# Patient Record
Sex: Male | Born: 1995 | Race: Black or African American | Hispanic: No | Marital: Single | Smoking: Former smoker
Health system: Southern US, Community
[De-identification: ages and names within clinical notes are randomized; demographics above are authoritative.]

## PROBLEM LIST (undated history)

## (undated) DIAGNOSIS — F329 Major depressive disorder, single episode, unspecified: Secondary | ICD-10-CM

## (undated) DIAGNOSIS — G8929 Other chronic pain: Secondary | ICD-10-CM

## (undated) DIAGNOSIS — F32A Depression, unspecified: Secondary | ICD-10-CM

## (undated) DIAGNOSIS — M545 Low back pain, unspecified: Secondary | ICD-10-CM

## (undated) DIAGNOSIS — D571 Sickle-cell disease without crisis: Secondary | ICD-10-CM

## (undated) DIAGNOSIS — D5700 Hb-SS disease with crisis, unspecified: Secondary | ICD-10-CM

## (undated) HISTORY — PX: SPLENECTOMY: SUR1306

---
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
## 2013-08-13 ENCOUNTER — Encounter (HOSPITAL_BASED_OUTPATIENT_CLINIC_OR_DEPARTMENT_OTHER): Payer: Self-pay | Admitting: Emergency Medicine

## 2013-08-13 ENCOUNTER — Emergency Department (HOSPITAL_BASED_OUTPATIENT_CLINIC_OR_DEPARTMENT_OTHER)
Admission: EM | Admit: 2013-08-13 | Discharge: 2013-08-13 | Disposition: A | Discharge status: Home / Self Care | Payer: Medicaid Other | Attending: Emergency Medicine | Admitting: Emergency Medicine

## 2013-08-13 DIAGNOSIS — F17200 Nicotine dependence, unspecified, uncomplicated: Secondary | ICD-10-CM | POA: Insufficient documentation

## 2013-08-13 DIAGNOSIS — Z79899 Other long term (current) drug therapy: Secondary | ICD-10-CM | POA: Insufficient documentation

## 2013-08-13 DIAGNOSIS — D5700 Hb-SS disease with crisis, unspecified: Secondary | ICD-10-CM | POA: Insufficient documentation

## 2013-08-13 HISTORY — DX: Hb-SS disease with crisis, unspecified: D57.00

## 2013-08-13 LAB — COMPREHENSIVE METABOLIC PANEL
ALT: 29 U/L (ref 0–53)
Alkaline Phosphatase: 74 U/L (ref 39–117)
CO2: 27 mEq/L (ref 19–32)
GFR calc Af Amer: >90 mL/min (ref >90)
GFR calc non Af Amer: >90 mL/min (ref >90)
Glucose, Bld: 107 mg/dL — ABNORMAL HIGH (ref 70–99)
Potassium: 4.2 mEq/L (ref 3.5–5.1)
Sodium: 136 mEq/L (ref 135–145)
Total Bilirubin: 2.3 mg/dL — ABNORMAL HIGH (ref 0.3–1.2)

## 2013-08-13 LAB — CBC
Hemoglobin: 9.2 g/dL — ABNORMAL LOW (ref 13.0–17.0)
RBC: 2.79 MIL/uL — ABNORMAL LOW (ref 4.22–5.81)

## 2013-08-13 MED ORDER — SODIUM CHLORIDE 0.9 % IV BOLUS (SEPSIS)
500.0000 mL | Freq: Once | INTRAVENOUS | Status: AC
  Administered 2013-08-13: 500 mL via INTRAVENOUS

## 2013-08-13 MED ORDER — OXYCODONE-ACETAMINOPHEN 5-325 MG PO TABS: 1.0000 | ORAL_TABLET | Freq: Four times a day (QID) | ORAL | Status: DC | PRN

## 2013-08-13 MED ORDER — KETOROLAC TROMETHAMINE 30 MG/ML IJ SOLN
30.0000 mg | Freq: Once | INTRAMUSCULAR | Status: AC
  Administered 2013-08-13: 30 mg via INTRAVENOUS
  Filled 2013-08-13: qty 1

## 2013-08-13 MED ORDER — ONDANSETRON HCL 4 MG/2ML IJ SOLN
4.0000 mg | Freq: Once | INTRAMUSCULAR | Status: AC
  Administered 2013-08-13: 4 mg via INTRAVENOUS
  Filled 2013-08-13: qty 2

## 2013-08-13 MED ORDER — HYDROMORPHONE HCL PF 1 MG/ML IJ SOLN
1.0000 mg | Freq: Once | INTRAMUSCULAR | Status: AC
  Administered 2013-08-13: 1 mg via INTRAVENOUS
  Filled 2013-08-13: qty 1

## 2013-08-13 NOTE — ED Provider Notes (Signed)
CSN: [id number]     Arrival date & time 08/13/13  1424 History  This chart was scribed for Ethelda Chick, MD by Dorothey Baseman, ED Scribe. This patient was seen in room MH04/MH04 and the patient's care was started at 3:54 PM.    Chief Complaint  Patient presents with  . Arm Pain   Patient is a 17 y.o. male presenting with arm pain. The history is provided by the patient. No language interpreter was used.  Arm Pain This is a recurrent problem. The current episode started more than 2 days ago. The problem occurs constantly. The problem has not changed since onset.Nothing relieves the symptoms. Treatments tried: hydrocodone. The treatment provided no relief.   HPI Comments: Jeffrey Morrison is a 17 y.o. Male with a history of sickle cell crisis who presents to the Emergency Department complaining of a constant pain to the right arm, from the shoulder to the elbow, onset 4 days ago that has not changed. Patient reports that his current symptoms feel similar to his past sickle cell crisis. Patient reports that [facility] usually manages his pain with hydrocodone, which has not been effective at relieving his current symptoms. He reports some associated chest pain that has since resolved. He denies fever. He states that he does not know his usual hemoglobin levels. Patient reports a history of splenectomy due to his sickle cell.   Past Medical History  Diagnosis Date  . Sickle cell crisis    Past Surgical History  Procedure Laterality Date  . Splenectomy     History reviewed. No pertinent family history. History  Substance Use Topics  . Smoking status: Current Every Day Smoker -- 0.50 packs/day    Types: Cigarettes  . Smokeless tobacco: Not on file  . Alcohol Use: No    Review of Systems  Constitutional: Negative for fever.  Musculoskeletal: Positive for myalgias.  All other systems reviewed and are negative.    Allergies  Review of patient's allergies indicates no known  allergies.  Home Medications   Current Outpatient Rx  Name  Route  Sig  Dispense  Refill  . folic acid (FOLVITE) 1 MG tablet   Oral   Take 1 mg by mouth daily.         Marland Kitchen HYDROcodone-acetaminophen (NORCO/VICODIN) 5-325 MG per tablet   Oral   Take 2 tablets by mouth every 6 (six) hours as needed for moderate pain.         . hydroxyurea (HYDREA) 500 MG capsule   Oral   Take 500 mg by mouth daily. May take with food to minimize GI side effects.         . penicillin v potassium (VEETID) 500 MG tablet   Oral   Take 500 mg by mouth 4 (four) times daily.         Marland Kitchen oxyCODONE-acetaminophen (PERCOCET/ROXICET) 5-325 MG per tablet   Oral   Take 1-2 tablets by mouth every 6 (six) hours as needed for severe pain.   15 tablet   0    Triage Vitals: BP 127/77  Pulse 93  Temp(Src) 99.1 F (37.3 C) (Oral)  Resp 18  Ht 5\' 9"  (1.753 m)  Wt 177 lb (80.287 kg)  BMI 26.13 kg/m2  SpO2 99%  Physical Exam  Nursing note and vitals reviewed. Constitutional: He is oriented to person, place, and time. He appears well-developed and well-nourished. No distress.  HENT:  Head: Normocephalic and atraumatic.  Eyes: Conjunctivae are normal. Scleral  icterus is present.  Mild scleral icterus bilaterally.   Neck: Normal range of motion. Neck supple.  Cardiovascular: Normal rate, regular rhythm and normal heart sounds.  Exam reveals no gallop and no friction rub.   No murmur heard. Pulmonary/Chest: Effort normal and breath sounds normal. No respiratory distress. He has no wheezes. He has no rales.  Abdominal: He exhibits no distension.  Musculoskeletal: Normal range of motion.  Tenderness to palpation diffusely to the right arm.   Neurological: He is alert and oriented to person, place, and time.  Skin: Skin is warm and dry.  Psychiatric: He has a normal mood and affect. His behavior is normal.    ED Course  Procedures (including critical care time)  DIAGNOSTIC STUDIES: Oxygen Saturation  is 99% on room air, normal by my interpretation.    COORDINATION OF CARE: 3:56 PM- Will order Dilaudid, Toradol, and Zofran to manage symptoms. Will order blood labs. Discussed treatment plan with patient at bedside and patient verbalized agreement.     Labs Review Labs Reviewed  CBC - Abnormal; Notable for the following:    WBC 20.8 (*)    RBC 2.79 (*)    Hemoglobin 9.2 (*)    HCT 25.9 (*)    RDW 18.8 (*)    All other components within normal limits  COMPREHENSIVE METABOLIC PANEL - Abnormal; Notable for the following:    Glucose, Bld 107 (*)    Total Protein 8.4 (*)    Total Bilirubin 2.3 (*)    All other components within normal limits  RETICULOCYTES - Abnormal; Notable for the following:    Retic Ct Pct 14.3 (*)    RBC. 2.83 (*)    Retic Count, Manual 404.7 (*)    All other components within normal limits   Imaging Review No results found.  EKG Interpretation   None       MDM   1. Sickle cell pain crisis    Pt presenting with c/o pain in right arm, states this feels similar to prior sickle cell exacerbations. He feels improved after pain medication and fluids in the ED period he has no fever. There is no sign of acute chest syndrome. His hemoglobin is 9.2 and reticulocyte count is mildly elevated. I do feel that it is reasonable to manage his pain as an outpatient.Discharged with strict return precautions.  Pt agreeable with plan.   I personally performed the services described in this documentation, which was scribed in my presence. The recorded information has been reviewed and is accurate.     Ethelda Chick, MD 08/15/13 [phone]

## 2013-08-13 NOTE — ED Notes (Signed)
Pt reports that he has had (R) arm pain from his elbow to his shoulder since Wednesday.  Reports that the pain has not changed since it started.  Reports that he believes that it is a sickle cell pain crisis.

## 2014-01-08 ENCOUNTER — Encounter (HOSPITAL_COMMUNITY): Payer: Self-pay | Admitting: Emergency Medicine

## 2014-01-08 ENCOUNTER — Inpatient Hospital Stay (HOSPITAL_COMMUNITY): Payer: Medicaid Other

## 2014-01-08 ENCOUNTER — Emergency Department (HOSPITAL_COMMUNITY): Payer: Medicaid Other

## 2014-01-08 ENCOUNTER — Inpatient Hospital Stay (HOSPITAL_COMMUNITY)
Admission: EM | Admit: 2014-01-08 | Discharge: 2014-01-12 | DRG: 812 | Disposition: A | Discharge status: Home / Self Care | Payer: Medicaid Other | Attending: Internal Medicine | Admitting: Internal Medicine

## 2014-01-08 DIAGNOSIS — Z79899 Other long term (current) drug therapy: Secondary | ICD-10-CM

## 2014-01-08 DIAGNOSIS — Z9089 Acquired absence of other organs: Secondary | ICD-10-CM

## 2014-01-08 DIAGNOSIS — Z23 Encounter for immunization: Secondary | ICD-10-CM

## 2014-01-08 DIAGNOSIS — D5700 Hb-SS disease with crisis, unspecified: Principal | ICD-10-CM | POA: Diagnosis present

## 2014-01-08 DIAGNOSIS — M79605 Pain in left leg: Secondary | ICD-10-CM

## 2014-01-08 DIAGNOSIS — D72829 Elevated white blood cell count, unspecified: Secondary | ICD-10-CM | POA: Diagnosis present

## 2014-01-08 DIAGNOSIS — M79604 Pain in right leg: Secondary | ICD-10-CM

## 2014-01-08 DIAGNOSIS — F17200 Nicotine dependence, unspecified, uncomplicated: Secondary | ICD-10-CM | POA: Diagnosis present

## 2014-01-08 DIAGNOSIS — K5900 Constipation, unspecified: Secondary | ICD-10-CM

## 2014-01-08 DIAGNOSIS — Z5900 Homelessness unspecified: Secondary | ICD-10-CM

## 2014-01-08 DIAGNOSIS — M79609 Pain in unspecified limb: Secondary | ICD-10-CM

## 2014-01-08 DIAGNOSIS — D649 Anemia, unspecified: Secondary | ICD-10-CM

## 2014-01-08 LAB — URINE MICROSCOPIC-ADD ON

## 2014-01-08 LAB — CBC WITH DIFFERENTIAL/PLATELET
BASOS PCT: 0 % (ref 0–1)
Basophils Absolute: 0 10*3/uL (ref 0.0–0.1)
EOS PCT: 0 % (ref 0–5)
Eosinophils Absolute: 0 10*3/uL (ref 0.0–0.7)
HCT: 25.6 % — ABNORMAL LOW (ref 39.0–52.0)
HEMOGLOBIN: 8.8 g/dL — AB (ref 13.0–17.0)
LYMPHS PCT: 11 % — AB (ref 12–46)
Lymphs Abs: 2.5 10*3/uL (ref 0.7–4.0)
MCH: 33.2 pg (ref 26.0–34.0)
MCHC: 34.4 g/dL (ref 30.0–36.0)
MCV: 96.6 fL (ref 78.0–100.0)
MONO ABS: 2.7 10*3/uL — AB (ref 0.1–1.0)
Monocytes Relative: 12 % (ref 3–12)
NEUTROS PCT: 77 % (ref 43–77)
Neutro Abs: 17.3 10*3/uL — ABNORMAL HIGH (ref 1.7–7.7)
PLATELETS: 422 10*3/uL — AB (ref 150–400)
RBC: 2.65 MIL/uL — AB (ref 4.22–5.81)
RDW: 23.6 % — ABNORMAL HIGH (ref 11.5–15.5)
WBC: 22.5 10*3/uL — AB (ref 4.0–10.5)

## 2014-01-08 LAB — URINALYSIS, ROUTINE W REFLEX MICROSCOPIC
Bilirubin Urine: NEGATIVE
Glucose, UA: NEGATIVE mg/dL
Hgb urine dipstick: NEGATIVE
KETONES UR: NEGATIVE mg/dL
NITRITE: NEGATIVE
PH: 6 (ref 5.0–8.0)
Protein, ur: NEGATIVE mg/dL
Specific Gravity, Urine: 1.017 (ref 1.005–1.030)
UROBILINOGEN UA: 1 mg/dL (ref 0.0–1.0)

## 2014-01-08 LAB — RETICULOCYTES
RBC.: 2.65 MIL/uL — ABNORMAL LOW (ref 4.22–5.81)
RETIC COUNT ABSOLUTE: 585.7 10*3/uL — AB (ref 19.0–186.0)
RETIC CT PCT: 22.1 % — AB (ref 0.4–3.1)

## 2014-01-08 LAB — MAGNESIUM: Magnesium: 2.1 mg/dL (ref 1.5–2.5)

## 2014-01-08 MED ORDER — HYDROXYUREA 500 MG PO CAPS
500.0000 mg | ORAL_CAPSULE | Freq: Every day | ORAL | Status: DC
  Administered 2014-01-08 – 2014-01-09 (×2): 500 mg via ORAL
  Filled 2014-01-08 (×3): qty 1

## 2014-01-08 MED ORDER — HYDROMORPHONE HCL PF 1 MG/ML IJ SOLN: 0.5000 mg | INTRAMUSCULAR | Status: AC | PRN

## 2014-01-08 MED ORDER — FOLIC ACID 1 MG PO TABS
1.0000 mg | ORAL_TABLET | Freq: Every day | ORAL | Status: DC
  Administered 2014-01-08 – 2014-01-12 (×5): 1 mg via ORAL
  Filled 2014-01-08 (×5): qty 1

## 2014-01-08 MED ORDER — OXYCODONE-ACETAMINOPHEN 5-325 MG PO TABS
1.0000 | ORAL_TABLET | Freq: Once | ORAL | Status: AC
  Administered 2014-01-08: 1 via ORAL
  Filled 2014-01-08: qty 1

## 2014-01-08 MED ORDER — NALOXONE HCL 0.4 MG/ML IJ SOLN: 0.4000 mg | INTRAMUSCULAR | Status: DC | PRN

## 2014-01-08 MED ORDER — KETOROLAC TROMETHAMINE 15 MG/ML IJ SOLN
15.0000 mg | Freq: Once | INTRAMUSCULAR | Status: AC
  Administered 2014-01-08: 15 mg via INTRAVENOUS
  Filled 2014-01-08: qty 1

## 2014-01-08 MED ORDER — DIPHENHYDRAMINE HCL 50 MG/ML IJ SOLN: 12.5000 mg | Freq: Four times a day (QID) | INTRAMUSCULAR | Status: DC | PRN

## 2014-01-08 MED ORDER — ONDANSETRON HCL 4 MG/2ML IJ SOLN: 4.0000 mg | Freq: Three times a day (TID) | INTRAMUSCULAR | Status: DC | PRN

## 2014-01-08 MED ORDER — SODIUM CHLORIDE 0.9 % IV SOLN
INTRAVENOUS | Status: DC
  Administered 2014-01-08 – 2014-01-09 (×3): via INTRAVENOUS

## 2014-01-08 MED ORDER — SODIUM CHLORIDE 0.9 % IJ SOLN: 9.0000 mL | INTRAMUSCULAR | Status: DC | PRN

## 2014-01-08 MED ORDER — HEPARIN SODIUM (PORCINE) 5000 UNIT/ML IJ SOLN
5000.0000 [IU] | Freq: Three times a day (TID) | INTRAMUSCULAR | Status: DC
  Administered 2014-01-08 – 2014-01-12 (×13): 5000 [IU] via SUBCUTANEOUS
  Filled 2014-01-08 (×16): qty 1

## 2014-01-08 MED ORDER — ONDANSETRON HCL 4 MG/2ML IJ SOLN: 4.0000 mg | Freq: Four times a day (QID) | INTRAMUSCULAR | Status: DC | PRN

## 2014-01-08 MED ORDER — PENICILLIN V POTASSIUM 500 MG PO TABS
500.0000 mg | ORAL_TABLET | Freq: Four times a day (QID) | ORAL | Status: DC
  Administered 2014-01-08 – 2014-01-11 (×13): 500 mg via ORAL
  Filled 2014-01-08 (×16): qty 1

## 2014-01-08 MED ORDER — PNEUMOCOCCAL VAC POLYVALENT 25 MCG/0.5ML IJ INJ
0.5000 mL | INJECTION | INTRAMUSCULAR | Status: AC
  Administered 2014-01-09: 0.5 mL via INTRAMUSCULAR
  Filled 2014-01-08 (×2): qty 0.5

## 2014-01-08 MED ORDER — DIPHENHYDRAMINE HCL 12.5 MG/5ML PO ELIX: 12.5000 mg | ORAL_SOLUTION | Freq: Four times a day (QID) | ORAL | Status: DC | PRN

## 2014-01-08 MED ORDER — HYDROMORPHONE HCL PF 1 MG/ML IJ SOLN
1.0000 mg | Freq: Once | INTRAMUSCULAR | Status: AC
  Administered 2014-01-08: 1 mg via INTRAVENOUS
  Filled 2014-01-08: qty 1

## 2014-01-08 MED ORDER — HYDROMORPHONE HCL PF 1 MG/ML IJ SOLN
1.5000 mg | Freq: Once | INTRAMUSCULAR | Status: AC
  Administered 2014-01-08: 1.5 mg via INTRAVENOUS
  Filled 2014-01-08: qty 2

## 2014-01-08 MED ORDER — SODIUM CHLORIDE 0.9 % IV SOLN
INTRAVENOUS | Status: DC
  Administered 2014-01-08: 06:00:00 via INTRAVENOUS

## 2014-01-08 MED ORDER — NICOTINE 7 MG/24HR TD PT24
7.0000 mg | MEDICATED_PATCH | Freq: Every day | TRANSDERMAL | Status: DC
  Administered 2014-01-08 – 2014-01-12 (×5): 7 mg via TRANSDERMAL
  Filled 2014-01-08 (×5): qty 1

## 2014-01-08 MED ORDER — HYDROMORPHONE 0.3 MG/ML IV SOLN
INTRAVENOUS | Status: DC
  Administered 2014-01-08 (×2): 1.5 mg via INTRAVENOUS
  Administered 2014-01-08: 20:00:00 via INTRAVENOUS
  Administered 2014-01-08: 1.8 mg via INTRAVENOUS
  Administered 2014-01-08: 2.7 mg via INTRAVENOUS
  Administered 2014-01-08: 08:00:00 via INTRAVENOUS
  Administered 2014-01-09: 0.3 mg via INTRAVENOUS
  Administered 2014-01-09: 3.3 mg via INTRAVENOUS
  Administered 2014-01-09: 2.1 mg via INTRAVENOUS
  Administered 2014-01-09: 1.8 mg via INTRAVENOUS
  Administered 2014-01-09: 6 mg via INTRAVENOUS
  Administered 2014-01-09: 1.8 mg via INTRAVENOUS
  Filled 2014-01-08 (×5): qty 25

## 2014-01-08 NOTE — ED Notes (Signed)
Discussed plan of care with Dr. Reather Converse.  Patient WBC count is 22.5, and he will receive a chest X-ray.  MD also requests a urine sample.  Discussed with the patient.

## 2014-01-08 NOTE — ED Notes (Signed)
Dr. Zavitz at the bedside.  

## 2014-01-08 NOTE — ED Notes (Signed)
Dr. Posey Pronto at the bedside (internal medicine)

## 2014-01-08 NOTE — Discharge Instructions (Signed)
If you were given medicines take as directed.  If you are on coumadin or contraceptives realize their levels and effectiveness is altered by many different medicines.  If you have any reaction (rash, tongues swelling, other) to the medicines stop taking and see a physician.   Please follow up as directed and return to the ER or see a physician for new or worsening symptoms (especially fever, chest pain, cough, other).  Thank you. Filed Vitals:   01/08/14 0112 01/08/14 0115 01/08/14 0145 01/08/14 0237  BP: 124/70 113/62 118/62 130/60  Pulse: 70 74 65 54  Temp: 98.5 F (36.9 C)     TempSrc: Oral     Resp: 17 21 17 14   Height: 5\' 9"  (1.753 m)     Weight: 177 lb (80.287 kg)     SpO2: 98% 99% 90% 98%

## 2014-01-08 NOTE — ED Notes (Signed)
Gave patient heat pack for legs as requested.

## 2014-01-08 NOTE — ED Notes (Signed)
Per EMS, patient was laying down watching tv, and started having back pain and bilateral leg pain.  Sickle cell, and usually has a crisis 3-4 times per week, uses Percocet for management but has ran out.  From home, Vitals on the way: 132/68, P 90-RR 18.

## 2014-01-08 NOTE — ED Notes (Signed)
Gave patient another pair of heating packs for legs.

## 2014-01-08 NOTE — H&P (Signed)
[facility] History and Physical  Patient: Jeffrey Morrison  WUJ:[id number]  DOB: March 10, 1996  DOS: the patient was seen and examined on 01/08/2014 PCP: No PCP Per Patient  Chief Complaint: Back pain and knee pain  HPI: Jeffrey Morrison is a 18 y.o. male with Past medical history of sickle cell disease, splenectomy. The patient presented with complaints of left flank pain. This started around evening. He was sitting and watching TV and all of a sudden he started having pain in his left flank. This pain was later on radiating to his groin and scrotum. He tried to took some Percocet that he had at home but that did not help his pain. And he brought to the hospital. In the hospital he started having complaints of bilateral knee pain right more than left. Later on the left knee pain still persisted despite IV medication and is making difficult for him to walk and therefore the patient will be admitted to the hospital. He denies any fever or chills denies any cough denies any nausea or vomiting denies any abdominal pain diarrhea or constipation denies any burning urination. Denies any swelling of his legs. Denies any trauma or injury. He mentions he is homeless and has been following with hematology in [facility]. Has an appointment for followup. Has been out of his pain medications. And does not have a PCP.  The patient is coming from home. And at his baseline independent for most of his ADL.  Review of Systems: as mentioned in the history of present illness.  A Comprehensive review of the other systems is negative.  Past Medical History  Diagnosis Date  . Sickle cell crisis    Past Surgical History  Procedure Laterality Date  . Splenectomy     Social History:  reports that he has been smoking Cigarettes.  He has been smoking about 0.50 packs per day. He does not have any smokeless tobacco history on file. He reports that he does not drink alcohol or use illicit drugs.  No Known  Allergies  History reviewed. No pertinent family history.  Prior to Admission medications   Medication Sig Start Date End Date Taking? Authorizing Provider  folic acid (FOLVITE) 1 MG tablet Take 1 mg by mouth daily.   Yes Historical Provider, MD  hydroxyurea (HYDREA) 500 MG capsule Take 500 mg by mouth daily. May take with food to minimize GI side effects.   Yes Historical Provider, MD  penicillin v potassium (VEETID) 500 MG tablet Take 500 mg by mouth 4 (four) times daily.   Yes Historical Provider, MD    Physical Exam: Filed Vitals:   01/08/14 0530 01/08/14 0545 01/08/14 0554 01/08/14 0600  BP: 102/52 107/47 107/47 120/70  Pulse: 61 65 65 56  Temp:   98.5 F (36.9 C)   TempSrc:      Resp:   14   Height:      Weight:      SpO2: 100% 100% 100% 99%    General: Alert, Awake and Oriented to Time, Place and Person. Appear in moderate distress Eyes: PERRL... ENT: Oral Mucosa clear dry. Neck: No JVD Cardiovascular: S1 and S2 Present, no Murmur, Peripheral Pulses Present Respiratory: Bilateral Air entry equal and Decreased, Clear to Auscultation,  No Crackles, no wheezes Abdomen: Bowel Sound Present, Soft and Non tender Skin: No Rash Extremities: Left knee tenderness on movement limited range of motion at hip as well, no Pedal edema, no calf tenderness Neurologic: Grossly no focal neuro deficit.  Labs on Admission:  CBC:  Recent Labs Lab 01/08/14 0051  WBC 22.5*  NEUTROABS 17.3*  HGB 8.8*  HCT 25.6*  MCV 96.6  PLT 422*    CMP     Component Value Date/Time   NA 136 08/13/2013 1620   K 4.2 08/13/2013 1620   CL 98 08/13/2013 1620   CO2 27 08/13/2013 1620   GLUCOSE 107* 08/13/2013 1620   BUN 8 08/13/2013 1620   CREATININE 0.70 08/13/2013 1620   CALCIUM 9.8 08/13/2013 1620   PROT 8.4* 08/13/2013 1620   ALBUMIN 4.7 08/13/2013 1620   AST 29 08/13/2013 1620   ALT 29 08/13/2013 1620   ALKPHOS 74 08/13/2013 1620   BILITOT 2.3* 08/13/2013 1620   GFRNONAA >90  08/13/2013 1620   GFRAA >90 08/13/2013 1620    No results found for this basename: LIPASE, AMYLASE,  in the last 168 hours No results found for this basename: AMMONIA,  in the last 168 hours  No results found for this basename: CKTOTAL, CKMB, CKMBINDEX, TROPONINI,  in the last 168 hours BNP (last 3 results) No results found for this basename: PROBNP,  in the last 8760 hours  Radiological Exams on Admission: Dg Chest 2 View  01/08/2014   CLINICAL DATA:  Sickle cell pain crisis.  EXAM: CHEST  2 VIEW  COMPARISON:  Chest radiograph performed 09/11/2013  FINDINGS: The lungs are well-aerated and clear. There is no evidence of focal opacification, pleural effusion or pneumothorax.  The heart is normal in size; the mediastinal contour is within normal limits. No acute osseous abnormalities are seen.  IMPRESSION: No acute cardiopulmonary process seen.   Electronically Signed   By: Garald Balding M.D.   On: 01/08/2014 03:09    Assessment/Plan Principal Problem:   Sickle cell pain crisis Active Problems:   Leukocytosis, unspecified   Anemia   1. Sickle cell pain crisis The patient is presenting with complaints of flank pain and knee pain. His urinalysis is negative for any acute abnormality and flank pain has resolved. He continues to have left hip and knee pain which is making difficult for her to walk. Therefore the patient will be admitted in the hospital for sickle cell crisis. He also appears to have significant elevation of WBC as well as a reticulocyte count. I would continue him on IV hydration, Hydrea, IV Dilaudid PCA, Foley acid. Check LDH, Check x-ray knee  2. Leukocytosis Patient is significantly different than leukocytosis with white count of 22. He does not take any steroids. He does not have any pneumonia on chest x-ray urine looks clear. He does not have any others evidence of infection. We will continue to monitor. Most likely stress response.  3. Anemia. Patient has sickle  cell anemia. We'll monitor.  4. History of splenectomy Patient takes penicillin on a regular daily basis I would continue that at present.   Consults: Sickle cell team  DVT Prophylaxis: subcutaneous Heparin Nutrition: Regular diet  Code Status: Full  Disposition: Admitted to inpatient in telemetry unit.  Author: Berle Mull, MD [facility] Pager: [phone] 01/08/2014, 6:22 AM    If 7PM-7AM, please contact night-coverage [email or web address] Password [email or web address]

## 2014-01-08 NOTE — Progress Notes (Signed)
Pt here from [facility] via medics.  Placed in room 1338.  After looking at transfere orders discovered pt was going to be on PCA and on cardiac monitor.   Informed Financial risk analyst.  Have put in for room transfer to a tele floor.  Waiting for room placement

## 2014-01-08 NOTE — Progress Notes (Signed)
Report called and given to [place], rn on 4th flr. Vwilliams,rn.

## 2014-01-08 NOTE — ED Notes (Signed)
Patient continues to complain of pain and request medication.

## 2014-01-08 NOTE — ED Provider Notes (Signed)
CSN: [id number]     Arrival date & time 01/08/14  0039 History   First MD Initiated Contact with Patient 01/08/14 0044     Chief Complaint  Patient presents with  . Sickle Cell Pain Crisis     (Consider location/radiation/quality/duration/timing/severity/associated sxs/prior Treatment) HPI Comments: 18 year old male with sickle cell anemia and smoking history presents with bilateral deep leg ache that has been worsening the past few days. His leg pain is similar to previous episodes of sickle cell crisis. Patient denies leg swelling or blood clot history. No recent surgeries or heart history. No respiratory or chest symptoms. No fevers or chills. Patient takes oral narcotics outpatient and he is out of his medicines. Patient feels well otherwise.  Patient is a 18 y.o. male presenting with sickle cell pain. The history is provided by the patient.  Sickle Cell Pain Crisis Associated symptoms: no chest pain, no congestion, no fever, no headaches, no shortness of breath and no vomiting     Past Medical History  Diagnosis Date  . Sickle cell crisis    Past Surgical History  Procedure Laterality Date  . Splenectomy     History reviewed. No pertinent family history. History  Substance Use Topics  . Smoking status: Current Every Day Smoker -- 0.50 packs/day    Types: Cigarettes  . Smokeless tobacco: Not on file  . Alcohol Use: No    Review of Systems  Constitutional: Negative for fever and chills.  HENT: Negative for congestion.   Eyes: Negative for visual disturbance.  Respiratory: Negative for shortness of breath.   Cardiovascular: Negative for chest pain.  Gastrointestinal: Negative for vomiting and abdominal pain.  Genitourinary: Negative for dysuria and flank pain.  Musculoskeletal: Positive for arthralgias. Negative for back pain, neck pain and neck stiffness.  Skin: Negative for rash.  Neurological: Negative for light-headedness and headaches.      Allergies  Review  of patient's allergies indicates no known allergies.  Home Medications   Prior to Admission medications   Medication Sig Start Date End Date Taking? Authorizing Provider  folic acid (FOLVITE) 1 MG tablet Take 1 mg by mouth daily.   Yes Historical Provider, MD  hydroxyurea (HYDREA) 500 MG capsule Take 500 mg by mouth daily. May take with food to minimize GI side effects.   Yes Historical Provider, MD  penicillin v potassium (VEETID) 500 MG tablet Take 500 mg by mouth 4 (four) times daily.   Yes Historical Provider, MD   BP 118/62  Pulse 65  Temp(Src) 98.5 F (36.9 C) (Oral)  Resp 17  Ht 5\' 9"  (1.753 m)  Wt 177 lb (80.287 kg)  BMI 26.13 kg/m2  SpO2 90% Physical Exam  Nursing note and vitals reviewed. Constitutional: He is oriented to person, place, and time. He appears well-developed and well-nourished.  HENT:  Head: Normocephalic and atraumatic.  Eyes: Conjunctivae are normal. Right eye exhibits no discharge. Left eye exhibits no discharge.  Neck: Normal range of motion. Neck supple. No tracheal deviation present.  Cardiovascular: Normal rate and regular rhythm.   Pulmonary/Chest: Effort normal and breath sounds normal.  Abdominal: Soft. He exhibits no distension. There is no tenderness. There is no guarding.  Musculoskeletal: He exhibits tenderness (mild tenderness lower femur region bilateral, no calf tenderness or leg swelling, distal pulses 2+ bilateral lower extremities.). He exhibits no edema.  Neurological: He is alert and oriented to person, place, and time. No cranial nerve deficit.  Skin: Skin is warm. No rash noted.  Psychiatric: He  has a normal mood and affect.    ED Course  Procedures (including critical care time) Labs Review Labs Reviewed  CBC WITH DIFFERENTIAL - Abnormal; Notable for the following:    WBC 22.5 (*)    RBC 2.65 (*)    Hemoglobin 8.8 (*)    HCT 25.6 (*)    RDW 23.6 (*)    Platelets 422 (*)    Lymphocytes Relative 11 (*)    Neutro Abs 17.3  (*)    Monocytes Absolute 2.7 (*)    All other components within normal limits  RETICULOCYTES - Abnormal; Notable for the following:    Retic Ct Pct 22.1 (*)    RBC. 2.65 (*)    Retic Count, Manual 585.7 (*)    All other components within normal limits    Imaging Review Dg Chest 2 View  01/08/2014   CLINICAL DATA:  Sickle cell pain crisis.  EXAM: CHEST  2 VIEW  COMPARISON:  Chest radiograph performed 09/11/2013  FINDINGS: The lungs are well-aerated and clear. There is no evidence of focal opacification, pleural effusion or pneumothorax.  The heart is normal in size; the mediastinal contour is within normal limits. No acute osseous abnormalities are seen.  IMPRESSION: No acute cardiopulmonary process seen.   Electronically Signed   By: Garald Balding M.D.   On: 01/08/2014 03:09     EKG Interpretation None      MDM   Final diagnoses:  Sickle cell pain crisis  Leg pain, bilateral  Leukocytosis    patient with known sickle cell disease and pain similar to previous. Patient improved significantly on recheck and well-appearing. White blood cell count elevated reviewed old records and patient has had significant elevation in the past.   Chest x-ray done and reviewed no acute findings. Patient well-appearing no source of white blood cell count elevation. Patient not on prednisone. Initially pain improved significantly and patient comfortable with outpatient followup of her pain worsens and no source of white blood cell count elevation. Discussed admission with patient and [facility] who agreed with transfer to [facility]. Urinalysis unremarkable. No fever in ED.  The patients results and plan were reviewed and discussed.   Any x-rays performed were personally reviewed by myself.   Differential diagnosis were considered with the presenting HPI.   Filed Vitals:   01/08/14 0345 01/08/14 0415 01/08/14 0430 01/08/14 0500  BP:  149/129 101/53 101/43  Pulse:  60 61 56  Temp:  98.3 F (36.8 C)     TempSrc: Oral     Resp:      Height:      Weight:      SpO2:  100% 100% 96%    Admission/ observation were discussed with the admitting physician, patient and/or family and they are comfortable with the plan.       Mariea Clonts, MD 01/08/14 [phone]

## 2014-01-09 ENCOUNTER — Inpatient Hospital Stay (HOSPITAL_COMMUNITY): Payer: Medicaid Other

## 2014-01-09 DIAGNOSIS — D72829 Elevated white blood cell count, unspecified: Secondary | ICD-10-CM

## 2014-01-09 DIAGNOSIS — D5700 Hb-SS disease with crisis, unspecified: Principal | ICD-10-CM

## 2014-01-09 DIAGNOSIS — K5900 Constipation, unspecified: Secondary | ICD-10-CM

## 2014-01-09 LAB — COMPREHENSIVE METABOLIC PANEL
ALK PHOS: 88 U/L (ref 39–117)
ALT: 25 U/L (ref 0–53)
AST: 29 U/L (ref 0–37)
Albumin: 3.7 g/dL (ref 3.5–5.2)
BILIRUBIN TOTAL: 2.6 mg/dL — AB (ref 0.3–1.2)
BUN: 8 mg/dL (ref 6–23)
CHLORIDE: 97 meq/L (ref 96–112)
CO2: 28 mEq/L (ref 19–32)
Calcium: 9.2 mg/dL (ref 8.4–10.5)
Creatinine, Ser: 0.77 mg/dL (ref 0.50–1.35)
GFR calc non Af Amer: >90 mL/min (ref >90)
GLUCOSE: 109 mg/dL — AB (ref 70–99)
POTASSIUM: 4.3 meq/L (ref 3.7–5.3)
SODIUM: 136 meq/L — AB (ref 137–147)
TOTAL PROTEIN: 7.3 g/dL (ref 6.0–8.3)

## 2014-01-09 LAB — CBC WITH DIFFERENTIAL/PLATELET
BASOS ABS: 0 10*3/uL (ref 0.0–0.1)
BASOS PCT: 0 % (ref 0–1)
EOS PCT: 0 % (ref 0–5)
Eosinophils Absolute: 0 10*3/uL (ref 0.0–0.7)
HEMATOCRIT: 21.9 % — AB (ref 39.0–52.0)
HEMOGLOBIN: 7.8 g/dL — AB (ref 13.0–17.0)
Lymphocytes Relative: 12 % (ref 12–46)
Lymphs Abs: 2 10*3/uL (ref 0.7–4.0)
MCH: 33.6 pg (ref 26.0–34.0)
MCHC: 35.6 g/dL (ref 30.0–36.0)
MCV: 94.4 fL (ref 78.0–100.0)
MONOS PCT: 18 % — AB (ref 3–12)
Monocytes Absolute: 3 10*3/uL — ABNORMAL HIGH (ref 0.1–1.0)
NEUTROS ABS: 11.8 10*3/uL — AB (ref 1.7–7.7)
Neutrophils Relative %: 70 % (ref 43–77)
PLATELETS: 327 10*3/uL (ref 150–400)
RBC: 2.32 MIL/uL — AB (ref 4.22–5.81)
RDW: 21.2 % — ABNORMAL HIGH (ref 11.5–15.5)
WBC: 16.8 10*3/uL — AB (ref 4.0–10.5)
nRBC: 13 /100 WBC — ABNORMAL HIGH

## 2014-01-09 MED ORDER — ALBUTEROL SULFATE (2.5 MG/3ML) 0.083% IN NEBU
2.5000 mg | INHALATION_SOLUTION | Freq: Four times a day (QID) | RESPIRATORY_TRACT | Status: DC | PRN
  Administered 2014-01-09: 2.5 mg via RESPIRATORY_TRACT
  Filled 2014-01-09: qty 3

## 2014-01-09 MED ORDER — DIPHENHYDRAMINE HCL 50 MG PO CAPS
50.0000 mg | ORAL_CAPSULE | Freq: Four times a day (QID) | ORAL | Status: DC | PRN
  Filled 2014-01-09: qty 1

## 2014-01-09 MED ORDER — OXYCODONE HCL 5 MG PO TABS
10.0000 mg | ORAL_TABLET | ORAL | Status: DC
  Administered 2014-01-09 – 2014-01-10 (×6): 10 mg via ORAL
  Filled 2014-01-09 (×6): qty 2

## 2014-01-09 MED ORDER — HYDROXYUREA 500 MG PO CAPS
1500.0000 mg | ORAL_CAPSULE | Freq: Every day | ORAL | Status: DC
  Administered 2014-01-10 – 2014-01-11 (×2): 1500 mg via ORAL
  Filled 2014-01-09 (×3): qty 3

## 2014-01-09 MED ORDER — KETOROLAC TROMETHAMINE 30 MG/ML IJ SOLN
30.0000 mg | Freq: Four times a day (QID) | INTRAMUSCULAR | Status: DC
  Administered 2014-01-09 – 2014-01-12 (×12): 30 mg via INTRAVENOUS
  Filled 2014-01-09 (×2): qty 1
  Filled 2014-01-09: qty 2
  Filled 2014-01-09 (×4): qty 1
  Filled 2014-01-09: qty 2
  Filled 2014-01-09 (×7): qty 1
  Filled 2014-01-09 (×2): qty 2
  Filled 2014-01-09 (×3): qty 1

## 2014-01-09 MED ORDER — OXYCODONE HCL 5 MG PO TABS
10.0000 mg | ORAL_TABLET | Freq: Once | ORAL | Status: AC
  Administered 2014-01-09: 10 mg via ORAL
  Filled 2014-01-09: qty 2

## 2014-01-09 MED ORDER — DIPHENHYDRAMINE HCL 25 MG PO CAPS
25.0000 mg | ORAL_CAPSULE | Freq: Four times a day (QID) | ORAL | Status: DC | PRN
  Administered 2014-01-09 – 2014-01-10 (×2): 25 mg via ORAL
  Filled 2014-01-09 (×2): qty 1

## 2014-01-09 MED ORDER — ZOLPIDEM TARTRATE 5 MG PO TABS
5.0000 mg | ORAL_TABLET | Freq: Once | ORAL | Status: AC
  Administered 2014-01-09: 5 mg via ORAL
  Filled 2014-01-09: qty 1

## 2014-01-09 MED ORDER — DEXTROSE-NACL 5-0.45 % IV SOLN
INTRAVENOUS | Status: DC
  Administered 2014-01-09: 75 mL/h via INTRAVENOUS
  Administered 2014-01-09 – 2014-01-12 (×4): via INTRAVENOUS

## 2014-01-09 MED ORDER — SENNOSIDES-DOCUSATE SODIUM 8.6-50 MG PO TABS
1.0000 | ORAL_TABLET | Freq: Two times a day (BID) | ORAL | Status: DC
  Administered 2014-01-09 – 2014-01-12 (×6): 1 via ORAL
  Filled 2014-01-09 (×9): qty 1

## 2014-01-09 MED ORDER — HYDROXYUREA 500 MG PO CAPS
1000.0000 mg | ORAL_CAPSULE | Freq: Once | ORAL | Status: AC
  Administered 2014-01-09: 1000 mg via ORAL
  Filled 2014-01-09: qty 2

## 2014-01-09 MED ORDER — HYDROMORPHONE HCL PF 1 MG/ML IJ SOLN
1.0000 mg | INTRAMUSCULAR | Status: DC | PRN
  Administered 2014-01-09 – 2014-01-10 (×5): 1 mg via INTRAVENOUS
  Filled 2014-01-09 (×5): qty 1

## 2014-01-09 NOTE — Care Management Note (Signed)
    Page 1 of 1   01/09/2014     2:49:52 PM CARE MANAGEMENT NOTE 01/09/2014  Patient:  Jeffrey Morrison, Jeffrey Morrison   Account Number:  [id number]  Date Initiated:  01/09/2014  Documentation initiated by:  Sunday Spillers  Subjective/Objective Assessment:   18 yo male admitted with SS crisis. PTA lived at home.     Action/Plan:   Home when stable   Anticipated DC Date:  01/12/2014   Anticipated DC Plan:  [place]  CM consult  PCP issues      Choice offered to / List presented to:             Status of service:  In process, will continue to follow Medicare Important Message given?  NA - LOS <3 / Initial given by admissions (If response is "NO", the following Medicare IM given date fields will be blank) Date Medicare IM given:   Date Additional Medicare IM given:    Discharge Disposition:    Per UR Regulation:  Reviewed for med. necessity/level of care/duration of stay  If discussed at [place] of Stay Meetings, dates discussed:    Comments:  01-09-14 [place] 1400 Spoke with patient at bedside, states he does not have a pcp, does not have medicaid card. States he was incarcerated last year and most of his personal documents were lost then. Discussed with him ways to access pcp through dss/medicaid, provided written resources for obtaining a pcp (healthconnect, wellness clinic, list of medicaid accepting physicans). Patient also states he is homeless, he has been staying with a friend and does not think he can return there. Contacted CSW to follow up.

## 2014-01-09 NOTE — Progress Notes (Signed)
SICKLE CELL SERVICE PROGRESS NOTE  Jeffrey Morrison BTD:[id number] DOB: [birth year] DOA: 01/08/2014 PCP: No PCP Per Patient  Assessment/Plan: Principal Problem:   Sickle cell pain crisis Active Problems:   Leukocytosis, unspecified   Anemia  1. 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 He reports pain as sharp, non-radiating. Pt denies any fevers, N/V. He states that he had diarrhea 2 days before pain began but has not had any diarrhea for the last 2 days. He rates his pain currently at 10/10. He has used 16.8 mg on the PCA and has had 65 demands and 56 deliveries. I will start oral Oxycodone scheduled and re-assess this afternoon. 2. Leukocytosis: No evidence of infection. Will continue to monitor WBC. 3. Sickle Cell Disease: He has been without his hydrea and Folic Acid for at least 5 weeks. Will restart at therapeutic dose.   4. Constipation: Start on senakot and miralax.   Code Status: Full Code Family Communication: N/A  Disposition Plan: Not yet ready for discharge  Jeffrey Morrison  Pager [phone]. If 7PM-7AM, please contact night-coverage.  01/09/2014, 10:19 AM  LOS: 1 day   Brief narrative: Jeffrey Morrison is a 18 y.o. male with Past medical history of sickle cell disease, splenectomy.  The patient presented with complaints of left flank pain. This started around evening. He was sitting and watching TV and all of a sudden he started having pain in his left flank. This pain was later on radiating to his groin and scrotum. He tried to took some Percocet that he had at home but that did not help his pain. And he brought to the hospital. In the hospital he started having complaints of bilateral knee pain right more than left. Later on the left knee pain still persisted despite IV medication and is making difficult for him to walk and therefore the  patient will be admitted to the hospital.  He denies any fever or chills denies any cough denies any nausea or vomiting denies any abdominal pain diarrhea or constipation denies any burning urination. Denies any swelling of his legs. Denies any trauma or injury.  He mentions he is homeless and has been following with hematology in [facility]. Has an appointment for followup. Has been out of his pain medications. And does not have a PCP.   Consultants:  None  Procedures:  None  Antibiotics:  None  HPI/Subjective: Pt states that pain is 9/10 and mostly localized to right knee and back. He describes pain as sharp and non-radiating. He has no other associated symptoms. His last BM was yesterday.  Objective: Filed Vitals:   01/09/14 0400 01/09/14 0545 01/09/14 0557 01/09/14 0800  BP:   133/75   Pulse:   76   Temp:   98.6 F (37 C)   TempSrc:   Oral   Resp: 17 13 11 12   Height:      Weight:      SpO2: 100% 100% 99% 99%   Weight change: -3 lb 4.4 oz (-1.487 kg)  Intake/Output Summary (Last 24 hours) at 01/09/14 1019 Last data filed at 01/09/14 0738  Gross per 24 hour  Intake 2173.33 ml  Output   3100 ml  Net -926.67 ml    General: Alert, awake, oriented x3, in no acute distress.  HEENT: [place]/AT PEERL, EOMI, anicteric Neck: Trachea  midline,  no masses, no thyromegal,y no JVD, no carotid bruit OROPHARYNX:  Moist, No exudate/ erythema/lesions.  Heart: Regular rate and rhythm, without murmurs, rubs, gallops.  Lungs: Clear to auscultation, no wheezing or rhonchi noted. No increased vocal fremitus resonant to percussion  Abdomen: Soft, nontender, nondistended, positive bowel sounds, no masses no hepatosplenomegaly noted..  Neuro: No focal neurological deficits noted cranial nerves II through XII grossly intact.  Strength functional in bilateral upper and lower extremities. Musculoskeletal: No warm swelling or erythema around joints, no spinal tenderness noted. Psychiatric:  Patient alert and oriented x3, good insight and cognition, good recent to remote recall.    Data Reviewed: Basic Metabolic Panel:  Recent Labs Lab 01/08/14 0816  MG 2.1   Liver Function Tests: No results found for this basename: AST, ALT, ALKPHOS, BILITOT, PROT, ALBUMIN,  in the last 168 hours No results found for this basename: LIPASE, AMYLASE,  in the last 168 hours No results found for this basename: AMMONIA,  in the last 168 hours CBC:  Recent Labs Lab 01/08/14 0051  WBC 22.5*  NEUTROABS 17.3*  HGB 8.8*  HCT 25.6*  MCV 96.6  PLT 422*   Cardiac Enzymes: No results found for this basename: CKTOTAL, CKMB, CKMBINDEX, TROPONINI,  in the last 168 hours BNP (last 3 results) No results found for this basename: PROBNP,  in the last 8760 hours CBG: No results found for this basename: GLUCAP,  in the last 168 hours  No results found for this or any previous visit (from the past 240 hour(s)).   Studies: Dg Chest 2 View  01/08/2014   CLINICAL DATA:  Sickle cell pain crisis.  EXAM: CHEST  2 VIEW  COMPARISON:  Chest radiograph performed 09/11/2013  FINDINGS: The lungs are well-aerated and clear. There is no evidence of focal opacification, pleural effusion or pneumothorax.  The heart is normal in size; the mediastinal contour is within normal limits. No acute osseous abnormalities are seen.  IMPRESSION: No acute cardiopulmonary process seen.   Electronically Signed   By: Garald Balding M.D.   On: 01/08/2014 03:09   Dg Knee Left Port  01/08/2014   CLINICAL DATA:  Left knee pain, sickle cell crisis  EXAM: PORTABLE LEFT KNEE - 1-2 VIEW  COMPARISON:  None.  FINDINGS: There is no evidence of fracture, dislocation, or joint effusion. There is no evidence of arthropathy or other focal bone abnormality. Soft tissues are unremarkable.  IMPRESSION: Negative.   Electronically Signed   By: Jacqulynn Cadet M.D.   On: 01/08/2014 08:35    Scheduled Meds: . folic acid  1 mg Oral Daily  .  heparin  5,000 Units Subcutaneous 3 times per day  . HYDROmorphone PCA 0.3 mg/mL   Intravenous 6 times per day  . hydroxyurea  1,000 mg Oral Once  . [START ON 01/10/2014] hydroxyurea  1,500 mg Oral Daily  . nicotine  7 mg Transdermal Daily  . oxyCODONE  10 mg Oral Once  . penicillin v potassium  500 mg Oral QID   Continuous Infusions: . dextrose 5 % and 0.45% NaCl      Principal Problem:   Sickle cell pain crisis Active Problems:   Leukocytosis, unspecified   Anemia

## 2014-01-09 NOTE — Progress Notes (Signed)
Transferred to room 1309, report given to [facility].

## 2014-01-09 NOTE — Progress Notes (Signed)
Patient called and handed me the phone, the lady on the  Other line introduce herself as Building services engineer, she stated that we've been looking for him  Because we have to issue a warrant,  further stated that  "i know he has sickle cell". And asked how long will the patient be admitted?. I told her I don't know, he in is still in pain. I asked the patient is that officer [place]?, patient answered "yes".

## 2014-01-10 LAB — CBC WITH DIFFERENTIAL/PLATELET
BASOS ABS: 0 10*3/uL (ref 0.0–0.1)
Basophils Relative: 0 % (ref 0–1)
Eosinophils Absolute: 0.1 10*3/uL (ref 0.0–0.7)
Eosinophils Relative: 1 % (ref 0–5)
HCT: 20.9 % — ABNORMAL LOW (ref 39.0–52.0)
Hemoglobin: 7.3 g/dL — ABNORMAL LOW (ref 13.0–17.0)
LYMPHS PCT: 20 % (ref 12–46)
Lymphs Abs: 2.3 10*3/uL (ref 0.7–4.0)
MCH: 33.3 pg (ref 26.0–34.0)
MCHC: 34.9 g/dL (ref 30.0–36.0)
MCV: 95.4 fL (ref 78.0–100.0)
MONOS PCT: 14 % — AB (ref 3–12)
Monocytes Absolute: 1.6 10*3/uL — ABNORMAL HIGH (ref 0.1–1.0)
Neutro Abs: 7.7 10*3/uL (ref 1.7–7.7)
Neutrophils Relative %: 65 % (ref 43–77)
PLATELETS: 350 10*3/uL (ref 150–400)
RBC: 2.19 MIL/uL — ABNORMAL LOW (ref 4.22–5.81)
RDW: 20.7 % — AB (ref 11.5–15.5)
WBC Morphology: INCREASED
WBC: 11.7 10*3/uL — AB (ref 4.0–10.5)

## 2014-01-10 LAB — MRSA PCR SCREENING: MRSA by PCR: NEGATIVE

## 2014-01-10 MED ORDER — POLYETHYLENE GLYCOL 3350 17 G PO PACK
17.0000 g | PACK | Freq: Every day | ORAL | Status: DC
  Administered 2014-01-10 – 2014-01-11 (×2): 17 g via ORAL
  Filled 2014-01-10 (×3): qty 1

## 2014-01-10 MED ORDER — MORPHINE SULFATE (PF) 1 MG/ML IV SOLN
INTRAVENOUS | Status: DC
  Administered 2014-01-10: 22:00:00 via INTRAVENOUS
  Administered 2014-01-11: 48.4 mg via INTRAVENOUS
  Administered 2014-01-11: 14.75 mg via INTRAVENOUS
  Administered 2014-01-11: 02:00:00 via INTRAVENOUS
  Administered 2014-01-11: 8.46 mg via INTRAVENOUS
  Administered 2014-01-11: 07:00:00 via INTRAVENOUS
  Filled 2014-01-10 (×3): qty 25

## 2014-01-10 MED ORDER — DIPHENHYDRAMINE HCL 25 MG PO CAPS
25.0000 mg | ORAL_CAPSULE | Freq: Four times a day (QID) | ORAL | Status: DC | PRN
  Administered 2014-01-12: 25 mg via ORAL
  Filled 2014-01-10: qty 1

## 2014-01-10 MED ORDER — SODIUM CHLORIDE 0.9 % IJ SOLN: 9.0000 mL | INTRAMUSCULAR | Status: DC | PRN

## 2014-01-10 MED ORDER — NALOXONE HCL 0.4 MG/ML IJ SOLN: 0.4000 mg | INTRAMUSCULAR | Status: DC | PRN

## 2014-01-10 MED ORDER — ONDANSETRON HCL 4 MG/2ML IJ SOLN: 4.0000 mg | Freq: Four times a day (QID) | INTRAMUSCULAR | Status: DC | PRN

## 2014-01-10 MED ORDER — MORPHINE SULFATE (PF) 1 MG/ML IV SOLN
INTRAVENOUS | Status: DC
  Administered 2014-01-10: 36.5 mg via INTRAVENOUS
  Administered 2014-01-10 (×2): via INTRAVENOUS
  Filled 2014-01-10 (×2): qty 25

## 2014-01-10 NOTE — Progress Notes (Signed)
SICKLE CELL SERVICE PROGRESS NOTE  ARSALAN BRISBIN WUJ:[id number] DOB: 05/27/1996 DOA: 01/08/2014 PCP: No PCP Per Patient  Assessment/Plan: Principal Problem:   Sickle cell pain crisis Active Problems:   Leukocytosis, unspecified   Anemia  1. 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 He reports pain as sharp, non-radiating. Pt denies any fevers, N/V. He states that he had diarrhea 2 days before pain began but has not had any diarrhea for the last 2 days. He rates his pain currently at 7/10 on current regimen after receiving IV bolus of Dilaudid. He is requesting restarting the  PCA but using morphine Sulfate instead. I will discontinue scheduled Oxycodone scheduled and restart Morphine PCA. I will re-assess this evening for effectiveness and consider basal dose overnight to allow patient to rest. Continue Toradol. Decrease IVF to [facility] as patient tolerating oral intake well. 2. Leukocytosis: No evidence of infection. Leukocytosis improving. Continue to monitor WBC. 3. Sickle Cell Disease: Continue hydrea and Folic Acid for at least 5 weeks.   4. Constipation: COotinue senakot and miralax.  5. Penicillin: Pt has been on chronic PCN which is only indicated up to age 31 in asplenic patients with SCD. I will discontinue PCN.  Code Status: Full Code Family Communication: N/A  Disposition Plan: Not yet ready for discharge  [name]  Pager [phone]. If 7PM-7AM, please contact night-coverage.  01/10/2014, 3:23 PM  LOS: 2 days   Brief narrative: KEMP GOMES is a 18 y.o. male with Past medical history of sickle cell disease, splenectomy.  The patient presented with complaints of left flank pain. This started around evening. He was sitting and watching TV and all of a sudden he started having pain in his left flank. This pain was later on radiating to his  groin and scrotum. He tried to took some Percocet that he had at home but that did not help his pain. And he brought to the hospital. In the hospital he started having complaints of bilateral knee pain right more than left. Later on the left knee pain still persisted despite IV medication and is making difficult for him to walk and therefore the patient will be admitted to the hospital.  He denies any fever or chills denies any cough denies any nausea or vomiting denies any abdominal pain diarrhea or constipation denies any burning urination. Denies any swelling of his legs. Denies any trauma or injury.  He mentions he is homeless and has been following with hematology in [facility] Has an appointment for followup. Has been out of his pain medications. And does not have a PCP.   Consultants:  None  Procedures:  None  Antibiotics:  None  HPI/Subjective: Pt states that pain is 7/10 and mostly localized to right knee and back. He describes pain as sharp and non-radiating. He has no other associated symptoms. His last BM was prior to hospitalization which contradicts his reports from yeaterday.  Objective: Filed Vitals:   01/10/14 0500 01/10/14 1000 01/10/14 1200 01/10/14 1318  BP: 121/63 126/60    Pulse: 84 68    Temp: 98.8 F (37.1 C) 98.9 F (37.2 C)    TempSrc: Oral Oral    Resp: 16 16 13 13   Height:      Weight: 174 lb 11.2 oz (79.243 kg)     SpO2: 100% 99%  94% 94%   Weight change: 15.6 oz (0.443 kg)  Intake/Output Summary (Last 24 hours) at 01/10/14 1523 Last data filed at 01/10/14 6546  Gross per 24 hour  Intake 1488.25 ml  Output   1350 ml  Net 138.25 ml    General: Alert, awake, oriented x3, in no acute distress.  HEENT: [place]/AT PEERL, EOMI, anicteric Neck: Trachea midline,  no masses, no thyromegal,y no JVD, no carotid bruit OROPHARYNX:  Moist, No exudate/ erythema/lesions.  Heart: Regular rate and rhythm, without murmurs, rubs, gallops.  Lungs: Clear to  auscultation, no wheezing or rhonchi noted. No increased vocal fremitus resonant to percussion  Abdomen: Soft, nontender, nondistended, positive bowel sounds, no masses no hepatosplenomegaly noted..  Neuro: No focal neurological deficits noted cranial nerves II through XII grossly intact.  Strength functional in bilateral upper and lower extremities. Musculoskeletal: No warm swelling or erythema around joints, no spinal tenderness noted. Psychiatric: Patient alert and oriented x3, good insight and cognition, good recent to remote recall.    Data Reviewed: Basic Metabolic Panel:  Recent Labs Lab 01/08/14 0816 01/09/14 1040  NA  --  136*  K  --  4.3  CL  --  97  CO2  --  28  GLUCOSE  --  109*  BUN  --  8  CREATININE  --  0.77  CALCIUM  --  9.2  MG 2.1  --    Liver Function Tests:  Recent Labs Lab 01/09/14 1040  AST 29  ALT 25  ALKPHOS 88  BILITOT 2.6*  PROT 7.3  ALBUMIN 3.7   No results found for this basename: LIPASE, AMYLASE,  in the last 168 hours No results found for this basename: AMMONIA,  in the last 168 hours CBC:  Recent Labs Lab 01/08/14 0051 01/09/14 1040 01/10/14 0343  WBC 22.5* 16.8* 11.7*  NEUTROABS 17.3* 11.8* 7.7  HGB 8.8* 7.8* 7.3*  HCT 25.6* 21.9* 20.9*  MCV 96.6 94.4 95.4  PLT 422* 327 350   Cardiac Enzymes: No results found for this basename: CKTOTAL, CKMB, CKMBINDEX, TROPONINI,  in the last 168 hours BNP (last 3 results) No results found for this basename: PROBNP,  in the last 8760 hours CBG: No results found for this basename: GLUCAP,  in the last 168 hours  Recent Results (from the past 240 hour(s))  MRSA PCR SCREENING     Status: None   Collection Time    01/09/14  8:03 PM      Result Value Ref Range Status   MRSA by PCR NEGATIVE  NEGATIVE Final   Comment:            The GeneXpert MRSA Assay (FDA     approved for NASAL specimens     only), is one component of a     comprehensive MRSA colonization     surveillance program. It  is not     intended to diagnose MRSA     infection nor to guide or     monitor treatment for     MRSA infections.     Studies: Dg Chest 2 View  01/08/2014   CLINICAL DATA:  Sickle cell pain crisis.  EXAM: CHEST  2 VIEW  COMPARISON:  Chest radiograph performed 09/11/2013  FINDINGS: The lungs are well-aerated and clear. There is no evidence of focal opacification, pleural effusion or pneumothorax.  The heart is normal in size; the mediastinal contour is within normal limits. No acute osseous abnormalities are seen.  IMPRESSION: No acute  cardiopulmonary process seen.   Electronically Signed   By: Garald Balding M.D.   On: 01/08/2014 03:09   Dg Knee Left Port  01/08/2014   CLINICAL DATA:  Left knee pain, sickle cell crisis  EXAM: PORTABLE LEFT KNEE - 1-2 VIEW  COMPARISON:  None.  FINDINGS: There is no evidence of fracture, dislocation, or joint effusion. There is no evidence of arthropathy or other focal bone abnormality. Soft tissues are unremarkable.  IMPRESSION: Negative.   Electronically Signed   By: Jacqulynn Cadet M.D.   On: 01/08/2014 08:35    Scheduled Meds: . folic acid  1 mg Oral Daily  . heparin  5,000 Units Subcutaneous 3 times per day  . hydroxyurea  1,500 mg Oral Daily  . ketorolac  30 mg Intravenous 4 times per day  . morphine   Intravenous 6 times per day  . nicotine  7 mg Transdermal Daily  . penicillin v potassium  500 mg Oral QID  . polyethylene glycol  17 g Oral Daily  . senna-docusate  1 tablet Oral BID   Continuous Infusions: . dextrose 5 % and 0.45% NaCl 75 mL/hr at 01/09/14 2342    Total time 40 minutes

## 2014-01-10 NOTE — Progress Notes (Signed)
Clinical Social Work Department BRIEF PSYCHOSOCIAL ASSESSMENT 01/10/2014  Patient:  Jeffrey Morrison, Jeffrey Morrison     Account Number:  [id number]     Admit date:  01/08/2014  Clinical Social Worker:  Ulyess Blossom  Date/Time:  01/10/2014 02:30 PM  Referred by:  Care Management  Date Referred:  01/10/2014 Referred for  Homelessness   Other Referral:   Interview type:  Patient Other interview type:    PSYCHOSOCIAL DATA Living Status:  ALONE Admitted from facility:   Level of care:   Primary support name:   Primary support relationship to patient:   Degree of support available:   very limited support system    CURRENT CONCERNS Current Concerns  Other - See comment   Other Concerns:   homeless issues    SOCIAL WORK ASSESSMENT / PLAN CSW received referral from [facility] that pt is homeless.    CSW met with pt at bedside. CSW introduced self and explained role. Pt discussed that he is currently homeless. Pt states that he has been homeless for the past year. Pt discussed that he stayed in homeless shelter in [facility] temporarily, but then pt friend offered for pt to stay at his home and that is where pt was residing prior to admission. Pt discussed that his friend said he could come back, but pt is unsure if pt friend is reliable and does not have pt friend phone number in order to get in touch with pt friend upon discharge. CSW discussed that CSW can assist pt with locating a shelter to ensure that pt has shelter at discharge until pt can make further arrangements with friends. Pt agreeable to CSW inquiring with shelters in [place] and [place] regarding availability.    CSW provided support as pt discussed the difficulty of getting on his own feet following the passing of his mother. Pt reports that he does not have contact with this Father and just recently reconnected with his sister. Pt reports that his sister is also trying to assist pt in locating a safe place to stay. Pt discussed  that he is currently on probation and has also been in contact with a Mental Health Coordinator through the [place]. Pt shared that he was working with [employer] outpatient services with applying for disability. Pt reports that he applied for disability and had his phone interview and awaiting determination, but pt does not have SSI at this time. Given that pt is awaiting SSI approval, a group home is not an option at this time as the pt does not have a payor source for the [place].    Pt reports barriers in getting his medications and noncompliance with follow up appointments with hematologist in [facility]. CSW provided pt with Medicaid Transportation phone number and encouraged pt to contact Medicaid Transportation to set up Medicaid Transportation in order for pt to have reliable transportation to doctor's appointments. Pt expressed understanding and reports that he plans to contact Medicaid transportation.    CSW provided pt with a list of homeless shelters, information about [employer], Me[employer] in [place], and [place] Transportation at bedside. CSW provided pt a a piece of paper and pen in order for pt to write down friends/sisters phone numbers when they call.    Pt expressed concern about getting medications at discharge. CSW will discuss with RNCM about potential assistance.    CSW to continue to follow and assist with providing pt resources and assisting with securing a homeless shelter  for pt upon discharge if pt unable to make arrangements with friends.   Assessment/plan status:  Psychosocial Support/Ongoing Assessment of Needs Other assessment/ plan:   Information/referral to community resources:   [employer], [employer] information, [employer] in [place] information, [employer] information    PATIENT'S/FAMILY'S RESPONSE TO PLAN OF CARE: Pt alert and oriented x 4. Pt has limited support and limited resources, but appears to be  eager to learn information about resources and states that he just wants to make a change. Pt open to discussing his situation and is hopeful that he will be approved SSI soon in order to potentially find placement in a Group Home or low income apartment.    Alison Murray, MSW, [place] [facility] [phone]

## 2014-01-11 MED ORDER — OXYCODONE HCL 10 MG PO TABS: 10.0000 mg | ORAL_TABLET | ORAL | Status: DC | PRN

## 2014-01-11 MED ORDER — MORPHINE SULFATE 4 MG/ML IJ SOLN
4.0000 mg | INTRAMUSCULAR | Status: DC | PRN
  Administered 2014-01-11 – 2014-01-12 (×3): 4 mg via INTRAVENOUS
  Filled 2014-01-11 (×3): qty 1

## 2014-01-11 MED ORDER — FOLIC ACID 1 MG PO TABS: 1.0000 mg | ORAL_TABLET | Freq: Every day | ORAL | Status: DC

## 2014-01-11 MED ORDER — OXYCODONE HCL 5 MG PO TABS
10.0000 mg | ORAL_TABLET | ORAL | Status: DC
  Administered 2014-01-11 – 2014-01-12 (×7): 10 mg via ORAL
  Filled 2014-01-11 (×8): qty 2

## 2014-01-11 NOTE — Progress Notes (Signed)
Patient ambulated in hallway (approximately 100 feet) with supervision and tolerated well.  Jeffrey Morrison  01/11/2014

## 2014-01-11 NOTE — Progress Notes (Signed)
CSW continuing to follow to provide support and assist with resources.   CSW received notification from MD that pt reported that he does not have any other clothing besides the clothes he came to the hospital with. CSW will gather some clothing to provide to pt upon discharge.   CSW met with pt at bedside. CSW made list for pt of important phone numbers and information regarding shelters to add to the resources that were provided to pt yesterday. CSW assisted pt in contacting Department of Social Services to complete assessment interview for [employer]. Interview completed and pt informed to call Medicaid Transportation on Monday to arrange transportation for upcoming medical appointments. Contact information provided to pt.   Pt reports that he plans to contact his friend and will likely return to his friends that he was staying with prior to admission at discharge.   Pt expressed a lot of appreciation for assistance with resources.  CSW to follow up tomorrow to assist with pt disposition.  Alison Murray, MSW, [place] [facility] [phone]

## 2014-01-11 NOTE — Progress Notes (Signed)
SICKLE CELL SERVICE PROGRESS NOTE  Jeffrey Morrison QVZ:[id number] DOB: May 21, 1996 DOA: 01/08/2014 PCP: No PCP Per Patient  Assessment/Plan: Principal Problem:   Sickle cell pain crisis Active Problems:   Leukocytosis, unspecified   Anemia  1. 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 He reports pain much improved and currently has no pain in his knee. I will discontinue the PCA . Schedule his oral analgesics and order IV morphine sulfate on a PRN basis. Continue Toradol.  2. Leukocytosis: No evidence of infection. Leukocytosis improving. Continue to monitor WBC. 3. Sickle Cell Disease: Continue hydrea and Folic Acid.   4. Constipation: COotinue senakot and miralax.  5. Penicillin: Pt has been on chronic PCN which is only indicated up to age 81 in asplenic patients with SCD. PCN discontinued.  Code Status: Full Code Family Communication: N/A  Disposition Plan: Not yet ready for discharge  Jeffrey Morrison  Pager [phone]. If 7PM-7AM, please contact night-coverage.  01/11/2014, 8:33 PM  LOS: 3 days   Brief narrative: Jeffrey Morrison is a 18 y.o. male with Past medical history of sickle cell disease, splenectomy.  The patient presented with complaints of left flank pain. This started around evening. He was sitting and watching TV and all of a sudden he started having pain in his left flank. This pain was later on radiating to his groin and scrotum. He tried to took some Percocet that he had at home but that did not help his pain. And he brought to the hospital. In the hospital he started having complaints of bilateral knee pain right more than left. Later on the left knee pain still persisted despite IV medication and is making difficult for him to walk and therefore the patient will be admitted to the hospital.  He denies any fever or chills denies any  cough denies any nausea or vomiting denies any abdominal pain diarrhea or constipation denies any burning urination. Denies any swelling of his legs. Denies any trauma or injury.  He mentions he is homeless and has been following with hematology in [facility]. Has an appointment for followup. Has been out of his pain medications. And does not have a PCP.   Consultants:  None  Procedures:  None  Antibiotics:  None  HPI/Subjective: Pt states that he has no pain in his right knee and back. He has no other associated symptoms. His last BM was prior to hospitalization which contradicts his reports from yeaterday.  Objective: Filed Vitals:   01/11/14 0915 01/11/14 1128 01/11/14 1339 01/11/14 1716  BP: 104/45  112/46 130/60  Pulse: 61  89 87  Temp: 98.4 F (36.9 C)  99.3 F (37.4 C) 99.3 F (37.4 C)  TempSrc: Oral  Oral Oral  Resp: 10 14 16 16   Height:      Weight:      SpO2: 97% 96% 100% 96%   Weight change: -5 lb 12.8 oz (-2.631 kg)  Intake/Output Summary (Last 24 hours) at 01/11/14 2033 Last data filed at 01/11/14 1339  Gross per 24 hour  Intake   2408 ml  Output   1700 ml  Net    708 ml    General: Alert, awake, oriented x3, in no acute distress.  HEENT: [place]/AT PEERL, EOMI, anicteric Neck: Trachea midline,  no masses, no thyromegal,y no JVD, no carotid bruit  OROPHARYNX:  Moist, No exudate/ erythema/lesions.  Heart: Regular rate and rhythm, without murmurs, rubs, gallops.  Lungs: Clear to auscultation, no wheezing or rhonchi noted. No increased vocal fremitus resonant to percussion  Abdomen: Soft, nontender, nondistended, positive bowel sounds, no masses no hepatosplenomegaly noted..  Neuro: No focal neurological deficits noted cranial nerves II through XII grossly intact.  Strength functional in bilateral upper and lower extremities. Musculoskeletal: No warm swelling or erythema around joints, no spinal tenderness noted. Psychiatric: Patient alert and oriented x3,  good insight and cognition, good recent to remote recall.    Data Reviewed: Basic Metabolic Panel:  Recent Labs Lab 01/08/14 0816 01/09/14 1040  NA  --  136*  K  --  4.3  CL  --  97  CO2  --  28  GLUCOSE  --  109*  BUN  --  8  CREATININE  --  0.77  CALCIUM  --  9.2  MG 2.1  --    Liver Function Tests:  Recent Labs Lab 01/09/14 1040  AST 29  ALT 25  ALKPHOS 88  BILITOT 2.6*  PROT 7.3  ALBUMIN 3.7   No results found for this basename: LIPASE, AMYLASE,  in the last 168 hours No results found for this basename: AMMONIA,  in the last 168 hours CBC:  Recent Labs Lab 01/08/14 0051 01/09/14 1040 01/10/14 0343  WBC 22.5* 16.8* 11.7*  NEUTROABS 17.3* 11.8* 7.7  HGB 8.8* 7.8* 7.3*  HCT 25.6* 21.9* 20.9*  MCV 96.6 94.4 95.4  PLT 422* 327 350   Cardiac Enzymes: No results found for this basename: CKTOTAL, CKMB, CKMBINDEX, TROPONINI,  in the last 168 hours BNP (last 3 results) No results found for this basename: PROBNP,  in the last 8760 hours CBG: No results found for this basename: GLUCAP,  in the last 168 hours  Recent Results (from the past 240 hour(s))  MRSA PCR SCREENING     Status: None   Collection Time    01/09/14  8:03 PM      Result Value Ref Range Status   MRSA by PCR NEGATIVE  NEGATIVE Final   Comment:            The GeneXpert MRSA Assay (FDA     approved for NASAL specimens     only), is one component of a     comprehensive MRSA colonization     surveillance program. It is not     intended to diagnose MRSA     infection nor to guide or     monitor treatment for     MRSA infections.     Studies: Dg Chest 2 View  01/08/2014   CLINICAL DATA:  Sickle cell pain crisis.  EXAM: CHEST  2 VIEW  COMPARISON:  Chest radiograph performed 09/11/2013  FINDINGS: The lungs are well-aerated and clear. There is no evidence of focal opacification, pleural effusion or pneumothorax.  The heart is normal in size; the mediastinal contour is within normal limits. No  acute osseous abnormalities are seen.  IMPRESSION: No acute cardiopulmonary process seen.   Electronically Signed   By: Garald Balding M.D.   On: 01/08/2014 03:09   Dg Knee Left Port  01/08/2014   CLINICAL DATA:  Left knee pain, sickle cell crisis  EXAM: PORTABLE LEFT KNEE - 1-2 VIEW  COMPARISON:  None.  FINDINGS: There is no evidence of fracture, dislocation, or joint effusion. There is no evidence of arthropathy or other focal bone abnormality. Soft tissues  are unremarkable.  IMPRESSION: Negative.   Electronically Signed   By: Jacqulynn Cadet M.D.   On: 01/08/2014 08:35    Scheduled Meds: . folic acid  1 mg Oral Daily  . heparin  5,000 Units Subcutaneous 3 times per day  . hydroxyurea  1,500 mg Oral Daily  . ketorolac  30 mg Intravenous 4 times per day  . nicotine  7 mg Transdermal Daily  . oxyCODONE  10 mg Oral Q4H  . polyethylene glycol  17 g Oral Daily  . senna-docusate  1 tablet Oral BID   Continuous Infusions: . dextrose 5 % and 0.45% NaCl 75 mL/hr at 01/11/14 1013    Total time 34 minutes

## 2014-01-11 NOTE — Progress Notes (Addendum)
Patient Controlled Analgesia 24 hour Summary  Total mg delivered: 118.5  Number of demands: 62 Number of deliveries: 35

## 2014-01-12 LAB — CBC WITH DIFFERENTIAL/PLATELET
BASOS PCT: 0 % (ref 0–1)
Basophils Absolute: 0 10*3/uL (ref 0.0–0.1)
Eosinophils Absolute: 0.2 10*3/uL (ref 0.0–0.7)
Eosinophils Relative: 2 % (ref 0–5)
HCT: 17.3 % — ABNORMAL LOW (ref 39.0–52.0)
Hemoglobin: 6 g/dL — CL (ref 13.0–17.0)
LYMPHS ABS: 2.6 10*3/uL (ref 0.7–4.0)
Lymphocytes Relative: 25 % (ref 12–46)
MCH: 31.7 pg (ref 26.0–34.0)
MCHC: 34.7 g/dL (ref 30.0–36.0)
MCV: 91.5 fL (ref 78.0–100.0)
MONOS PCT: 12 % (ref 3–12)
Monocytes Absolute: 1.3 10*3/uL — ABNORMAL HIGH (ref 0.1–1.0)
Neutro Abs: 6.4 10*3/uL (ref 1.7–7.7)
Neutrophils Relative %: 61 % (ref 43–77)
Platelets: 436 10*3/uL — ABNORMAL HIGH (ref 150–400)
RBC: 1.89 MIL/uL — AB (ref 4.22–5.81)
RDW: 21.7 % — ABNORMAL HIGH (ref 11.5–15.5)
WBC: 10.5 10*3/uL (ref 4.0–10.5)
nRBC: 5 /100 WBC — ABNORMAL HIGH

## 2014-01-12 LAB — COMPREHENSIVE METABOLIC PANEL
ALT: 113 U/L — AB (ref 0–53)
AST: 104 U/L — AB (ref 0–37)
Albumin: 3.2 g/dL — ABNORMAL LOW (ref 3.5–5.2)
Alkaline Phosphatase: 98 U/L (ref 39–117)
BILIRUBIN TOTAL: 2.8 mg/dL — AB (ref 0.3–1.2)
BUN: 13 mg/dL (ref 6–23)
CHLORIDE: 99 meq/L (ref 96–112)
CO2: 27 meq/L (ref 19–32)
CREATININE: 0.9 mg/dL (ref 0.50–1.35)
Calcium: 8.8 mg/dL (ref 8.4–10.5)
GFR calc Af Amer: >90 mL/min (ref >90)
GFR calc non Af Amer: >90 mL/min (ref >90)
Glucose, Bld: 104 mg/dL — ABNORMAL HIGH (ref 70–99)
POTASSIUM: 4.1 meq/L (ref 3.7–5.3)
Sodium: 136 mEq/L — ABNORMAL LOW (ref 137–147)
Total Protein: 6.5 g/dL (ref 6.0–8.3)

## 2014-01-12 LAB — HEMOGLOBINOPATHY EVALUATION
HEMOGLOBIN OTHER: 0 %
HGB A2 QUANT: 3 % (ref 2.2–3.2)
Hgb A: 0 % — ABNORMAL LOW (ref 96.8–97.8)
Hgb F Quant: 12.8 % — ABNORMAL HIGH (ref 0.0–2.0)
Hgb S Quant: 84.2 % — ABNORMAL HIGH

## 2014-01-12 LAB — ABO/RH: ABO/RH(D): A POS

## 2014-01-12 NOTE — Discharge Summary (Signed)
Jeffrey Morrison MRN: [id number] DOB/AGE: 01/04/1996 18 y.o.  Admit date: 01/08/2014 Discharge date: 01/12/2014  Primary Care Physician:  No PCP Per Patient   Discharge Diagnoses:   Patient Active Problem List   Diagnosis Date Noted  . Sickle cell pain crisis 01/08/2014  . Sickle cell crisis 01/08/2014  . Leukocytosis, unspecified 01/08/2014  . Anemia 01/08/2014    DISCHARGE MEDICATION:   Medication List    STOP taking these medications       hydroxyurea 500 MG capsule  Commonly known as:  HYDREA     penicillin v potassium 500 MG tablet  Commonly known as:  VEETID      TAKE these medications       folic acid 1 MG tablet  Commonly known as:  FOLVITE  Take 1 tablet (1 mg total) by mouth daily.     Oxycodone HCl 10 MG Tabs  Take 1 tablet (10 mg total) by mouth every 4 (four) hours as needed for severe pain.          Consults:     SIGNIFICANT DIAGNOSTIC STUDIES:  Dg Chest 2 View  01/08/2014   CLINICAL DATA:  Sickle cell pain crisis.  EXAM: CHEST  2 VIEW  COMPARISON:  Chest radiograph performed 09/11/2013  FINDINGS: The lungs are well-aerated and clear. There is no evidence of focal opacification, pleural effusion or pneumothorax.  The heart is normal in size; the mediastinal contour is within normal limits. No acute osseous abnormalities are seen.  IMPRESSION: No acute cardiopulmonary process seen.   Electronically Signed   By: Garald Balding M.D.   On: 01/08/2014 03:09   Dg Chest Port 1 View  01/09/2014   CLINICAL DATA:  Sickle cell patient, wheezing, asthma  EXAM: PORTABLE CHEST - 1 VIEW  COMPARISON:  DG CHEST 2 VIEW dated 01/08/2014  FINDINGS: The heart size and mediastinal contours are within normal limits. Both lungs are clear. The visualized skeletal structures are unremarkable.  IMPRESSION: No active disease.   Electronically Signed   By: Kathreen Devoid   On: 01/09/2014 20:53   Dg Knee Left Port  01/08/2014   CLINICAL DATA:  Left knee pain, sickle cell crisis   EXAM: PORTABLE LEFT KNEE - 1-2 VIEW  COMPARISON:  None.  FINDINGS: There is no evidence of fracture, dislocation, or joint effusion. There is no evidence of arthropathy or other focal bone abnormality. Soft tissues are unremarkable.  IMPRESSION: Negative.   Electronically Signed   By: Jacqulynn Cadet M.D.   On: 01/08/2014 08:35      Recent Results (from the past 240 hour(s))  MRSA PCR SCREENING     Status: None   Collection Time    01/09/14  8:03 PM      Result Value Ref Range Status   MRSA by PCR NEGATIVE  NEGATIVE Final   Comment:            The GeneXpert MRSA Assay (FDA     approved for NASAL specimens     only), is one component of a     comprehensive MRSA colonization     surveillance program. It is not     intended to diagnose MRSA     infection nor to guide or     monitor treatment for     MRSA infections.    BRIEF ADMITTING H & P: Jeffrey Morrison is a 18 y.o. male with Past medical history of sickle cell disease, autosplenectomy. The patient presented with complaints  of left flank pain. This started around evening. He was sitting and watching TV and all of a sudden he started having pain in his left flank. This pain was later on radiating to his groin and scrotum. He tried to took some Percocet that he had at home but that did not help his pain. And he brought to the hospital. In the hospital he started having complaints of bilateral knee pain right more than left. Later on the left knee pain still persisted despite IV medication and is making difficult for him to walk and therefore the patient will be admitted to the hospital.  He denies any fever or chills denies any cough denies any nausea or vomiting denies any abdominal pain diarrhea or constipation denies any burning urination. Denies any swelling of his legs. Denies any trauma or injury.  He mentions he is homeless and has been following with hematology in [facility] Has an appointment for followup. Has been out of his  pain medications. And does not have a PCP.  The patient is coming from home. And at his baseline independent for most of his ADL    Hospital Course:  Present on Admission:  . Sickle cell pain crisis: Pt was initially managed with Dilaudid PCA which was ineffective. He was changed to IV Morphine via PCA which was effective in managing his pain. He was transitioned to oral analgesics and is discharged home on Oxycodone 10 mg every 4 hours as needed for pain. Pt will follow up in the office for an initial appointment on 01/23/2014. Marland Kitchen Leukocytosis, unspecified: Felt to be related to crisis. Pt had no evidence of infection. It was resolved at time of discharge. . Anemia: Pt has a Hb of 6.0 but no signs of acute anemia. He does not know his baseline Hb and we were unable t obtain previous records to confirm. . Sickle Cell Disease: Pt has not had close enough follow up to be safe on Hydrea. Will stop Hydrea and resume once patient has been seen in the office ans has established care. I have spoken with his Case Manager from the Agency to request assistance with facilitating keeping his appointment. He was also on PCN-VK for prophylaxis. There is no indication to continue prophylactic PCN beyond age 72 in a patient with sickle cell disease. Thus PCN was discontinued. He was resumed on folic acid.   Disposition and Follow-up:  Pt was discharged in stable condition and will follow up in the clinic on 01/23/2014 for initial office visit.      Discharge Orders   Future Appointments Provider Department Dept Phone   01/23/2014 3:30 PM [name], MD [place] [phone]   Future Orders Complete By Expires   Activity as tolerated - No restrictions  As directed    Diet general  As directed       DISCHARGE EXAM:  General: Alert, awake, oriented x3, in no acute distress.  Vital Signs:BP 103/50, HR 69, T 99 F (37.2 C), temperature source Oral, RR 16, height 5\' 9"  (1.753 m),  weight 176 lb 9.4 oz (80.1 kg), SpO2 98.00%. HEENT: [place]/AT PEERL, EOMI, anicteric  Neck: Trachea midline, no masses, no thyromegal,y no JVD, no carotid bruit  OROPHARYNX: Moist, No exudate/ erythema/lesions.  Heart: Regular rate and rhythm, without murmurs, rubs, gallops.  Lungs: Clear to auscultation, no wheezing or rhonchi noted. No increased vocal fremitus resonant to percussion  Abdomen: Soft, nontender, nondistended, positive bowel sounds, no masses no  hepatosplenomegaly noted..  Neuro: No focal neurological deficits noted cranial nerves II through XII grossly intact. Strength functional in bilateral upper and lower extremities.  Musculoskeletal: No warm swelling or erythema around joints, no spinal tenderness noted.  Psychiatric: Patient alert and oriented x3, good insight and cognition, good recent to remote recall.    Recent Labs  01/12/14 0349  NA 136*  K 4.1  CL 99  CO2 27  GLUCOSE 104*  BUN 13  CREATININE 0.90  CALCIUM 8.8    Recent Labs  01/12/14 0349  AST 104*  ALT 113*  ALKPHOS 98  BILITOT 2.8*  PROT 6.5  ALBUMIN 3.2*   No results found for this basename: LIPASE, AMYLASE,  in the last 72 hours  Recent Labs  01/10/14 0343 01/12/14 0349  WBC 11.7* 10.5  NEUTROABS 7.7 6.4  HGB 7.3* 6.0*  HCT 20.9* 17.3*  MCV 95.4 91.5  PLT 350 436*   Total time for discharge including face to face and decision making was greater than 30 minutes. Signed: [name] 01/12/2014, 12:12 PM [name]

## 2014-01-12 NOTE — Progress Notes (Signed)
Discharge instructions reviewed with patient using teach back method and he demonstrated understanding.  Patient stable for discharge home.  Patient's assessment unchanged from this am.  Patient understands follow up appointments.  Patient supervised with walking down to ED entrance and he was picked up by his father.  Frederica Kuster  01/12/2014

## 2014-01-12 NOTE — Progress Notes (Signed)
CRITICAL VALUE ALERT  Critical value received:HGB 6.0  Date of notification: 01/12/14  Time of notification:  0500  Critical value read back:YES  Nurse who received alert:Gianella Chismar  Liberty Handy , RN  MD notified (1st page): Tylene Fantasia, NP  Time of first page:0505  MD notified (2nd page):  Time of second page:  Responding MD:K . Baltazar Najjar ,NP  Time MD responded: [phone]

## 2014-01-12 NOTE — Progress Notes (Signed)
Pt for discharge today.   CSW met with pt at bedside. Pt reports that pt father offered to provide transportation from the hospital and offered to allow pt to stay with him upon discharge. Pt reports that he does not feel that this is ideal, but is willing to accept the offer and then determine from there where he will stay. CSW provided support and provided pt with clothing and shoes to have upon discharge. CSW reminded pt of need to contact Medicaid transportation on Monday to schedule transportation for his follow up appointment and referred pt back to the list of resources CSW made for pt.   CSW provided RN with bus pass in the instance that pt father is unable to provide transportation.  No further social work needs identified at this time.  CSW signing off.   Alison Murray, MSW, [place] [phone]

## 2014-01-13 LAB — TYPE AND SCREEN
ABO/RH(D): A POS
Antibody Screen: NEGATIVE

## 2014-01-23 ENCOUNTER — Ambulatory Visit: Payer: Medicaid Other | Admitting: Internal Medicine

## 2014-03-21 ENCOUNTER — Emergency Department (HOSPITAL_COMMUNITY)
Admission: EM | Admit: 2014-03-21 | Discharge: 2014-03-21 | Disposition: A | Discharge status: Home / Self Care | Payer: Medicaid Other | Attending: Emergency Medicine | Admitting: Emergency Medicine

## 2014-03-21 DIAGNOSIS — Z91199 Patient's noncompliance with other medical treatment and regimen due to unspecified reason: Secondary | ICD-10-CM | POA: Diagnosis not present

## 2014-03-21 DIAGNOSIS — Z9114 Patient's other noncompliance with medication regimen: Secondary | ICD-10-CM

## 2014-03-21 DIAGNOSIS — S032XXA Dislocation of tooth, initial encounter: Secondary | ICD-10-CM

## 2014-03-21 DIAGNOSIS — S025XXA Fracture of tooth (traumatic), initial encounter for closed fracture: Secondary | ICD-10-CM | POA: Insufficient documentation

## 2014-03-21 DIAGNOSIS — Y9389 Activity, other specified: Secondary | ICD-10-CM | POA: Insufficient documentation

## 2014-03-21 DIAGNOSIS — Y929 Unspecified place or not applicable: Secondary | ICD-10-CM | POA: Insufficient documentation

## 2014-03-21 DIAGNOSIS — K08119 Complete loss of teeth due to trauma, unspecified class: Secondary | ICD-10-CM | POA: Diagnosis not present

## 2014-03-21 DIAGNOSIS — Z9119 Patient's noncompliance with other medical treatment and regimen: Secondary | ICD-10-CM | POA: Diagnosis not present

## 2014-03-21 DIAGNOSIS — IMO0002 Reserved for concepts with insufficient information to code with codable children: Secondary | ICD-10-CM | POA: Insufficient documentation

## 2014-03-21 DIAGNOSIS — D5700 Hb-SS disease with crisis, unspecified: Secondary | ICD-10-CM | POA: Diagnosis not present

## 2014-03-21 DIAGNOSIS — F17200 Nicotine dependence, unspecified, uncomplicated: Secondary | ICD-10-CM | POA: Insufficient documentation

## 2014-03-21 MED ORDER — PENICILLIN V POTASSIUM 250 MG PO TABS: 500.0000 mg | ORAL_TABLET | Freq: Once | ORAL | Status: DC

## 2014-03-21 MED ORDER — FOLIC ACID 1 MG PO TABS: 1.0000 mg | ORAL_TABLET | Freq: Every day | ORAL | Status: DC

## 2014-03-21 MED ORDER — OXYCODONE-ACETAMINOPHEN 5-325 MG PO TABS: ORAL_TABLET | ORAL | Status: DC

## 2014-03-21 MED ORDER — HYDROXYUREA 500 MG PO CAPS: 500.0000 mg | ORAL_CAPSULE | Freq: Every day | ORAL | Status: DC

## 2014-03-21 MED ORDER — PENICILLIN V POTASSIUM 500 MG PO TABS: 500.0000 mg | ORAL_TABLET | Freq: Three times a day (TID) | ORAL | Status: DC

## 2014-03-21 MED ORDER — OXYCODONE-ACETAMINOPHEN 5-325 MG PO TABS: 1.0000 | ORAL_TABLET | Freq: Once | ORAL | Status: DC
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
## 2014-03-21 NOTE — Discharge Instructions (Signed)
Take percocet for breakthrough pain, do not drink alcohol, drive, care for children or do other critical tasks while taking percocet. ° °Return to the emergency room for fever, change in vision, redness to the face that rapidly spreads towards the eye, nausea or vomiting, difficulty swallowing or shortness of breath. °  °Apply warm compresses to jaw throughout the day.  ° ° Take your antibiotics as directed and to the end of the course.  ° °Followup with a dentist is very important for ongoing evaluation and management of recurrent dental pain. Return to emergency department for emergent changing or worsening symptoms." ° °Low-cost dental clinic: °**David  Civils  at [phone]**  °**Janna Civils at [phone] [address]**   ° °You may also call [phone] ° °Dental Assistance °If the dentist on-call cannot see you, please use the resources below: ° ° °Patients with Medicaid: [facility] °[address], [phone] °[address], [phone] ° °If unable to pay, or uninsured, contact [facility] ([phone]) or [facility] ([phone] in [place], [phone] in [place]) to become qualified for the adult dental clinic ° °Other Low-Cost Community Dental Services: °[facility]- [address], [place], [place], [address] °   [phone], Ext. [phone] °   2nd and 4th Thursday of the month at 6:30am °   10 clients each day by appointment, can sometimes see walk-in     patients if someone does not show for an appointment °[facility]- [address], [place], [place], [address] °   [phone] °[facility]- [address], [place], [place], [address] °   [phone] ° °[facility]- [phone] °[facility]- [phone] °[facility]- [phone] ° °

## 2014-03-22 NOTE — ED Provider Notes (Signed)
Medical screening examination/treatment/procedure(s) were performed by non-physician practitioner and as supervising physician I was immediately available for consultation/collaboration.   EKG Interpretation None       Ezequiel Essex, MD 03/22/14 [phone]

## 2014-08-04 ENCOUNTER — Encounter (HOSPITAL_COMMUNITY): Payer: Self-pay

## 2014-08-04 ENCOUNTER — Emergency Department (HOSPITAL_COMMUNITY)
Admission: EM | Admit: 2014-08-04 | Discharge: 2014-08-05 | Disposition: A | Discharge status: Home / Self Care | Payer: Medicaid Other | Attending: Emergency Medicine | Admitting: Emergency Medicine

## 2014-08-04 DIAGNOSIS — Z720 Tobacco use: Secondary | ICD-10-CM | POA: Diagnosis not present

## 2014-08-04 DIAGNOSIS — Z792 Long term (current) use of antibiotics: Secondary | ICD-10-CM | POA: Diagnosis not present

## 2014-08-04 DIAGNOSIS — D5700 Hb-SS disease with crisis, unspecified: Secondary | ICD-10-CM

## 2014-08-04 DIAGNOSIS — Z79899 Other long term (current) drug therapy: Secondary | ICD-10-CM | POA: Diagnosis not present

## 2014-08-04 DIAGNOSIS — M545 Low back pain, unspecified: Secondary | ICD-10-CM

## 2014-08-04 LAB — BASIC METABOLIC PANEL
Anion gap: 12 (ref 5–15)
BUN: 17 mg/dL (ref 6–23)
CO2: 24 mEq/L (ref 19–32)
Calcium: 9.2 mg/dL (ref 8.4–10.5)
Chloride: 105 mEq/L (ref 96–112)
Creatinine, Ser: 0.98 mg/dL (ref 0.50–1.35)
GFR calc Af Amer: >90 mL/min (ref >90)
GFR calc non Af Amer: >90 mL/min (ref >90)
Glucose, Bld: 110 mg/dL — ABNORMAL HIGH (ref 70–99)
Potassium: 4.5 mEq/L (ref 3.7–5.3)
Sodium: 141 mEq/L (ref 137–147)

## 2014-08-04 LAB — RETICULOCYTES
RBC.: 2.41 MIL/uL — ABNORMAL LOW (ref 4.22–5.81)
Retic Ct Pct: >23 % — ABNORMAL HIGH (ref 0.4–3.1)

## 2014-08-04 MED ORDER — SODIUM CHLORIDE 0.9 % IV SOLN
1000.0000 mL | Freq: Once | INTRAVENOUS | Status: AC
  Administered 2014-08-04: 1000 mL via INTRAVENOUS

## 2014-08-04 MED ORDER — HYDROMORPHONE HCL 1 MG/ML IJ SOLN
1.0000 mg | Freq: Once | INTRAMUSCULAR | Status: AC
  Administered 2014-08-04: 1 mg via INTRAVENOUS
  Filled 2014-08-04: qty 1

## 2014-08-04 MED ORDER — DIPHENHYDRAMINE HCL 50 MG/ML IJ SOLN
25.0000 mg | Freq: Once | INTRAMUSCULAR | Status: AC
  Administered 2014-08-04: 25 mg via INTRAVENOUS
  Filled 2014-08-04: qty 1

## 2014-08-04 NOTE — ED Notes (Signed)
Bed: WA12 Expected date:  Expected time:  Means of arrival:  Comments: EMS/sickle cell crisis 
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
## 2014-08-04 NOTE — ED Notes (Signed)
Pt presents via EMS with c/o back pain. Pt reports a hx of sickle cell and feels that this pain is similar to the pain he feels when he is in [facility]. IV 20g left forearm placed by EMS. Pt given 30mg  toradol en route by EMS.

## 2014-08-05 ENCOUNTER — Emergency Department (HOSPITAL_COMMUNITY)
Admission: EM | Admit: 2014-08-05 | Discharge: 2014-08-05 | Disposition: A | Discharge status: Home / Self Care | Payer: Medicaid Other | Attending: Emergency Medicine | Admitting: Emergency Medicine

## 2014-08-05 ENCOUNTER — Encounter (HOSPITAL_COMMUNITY): Payer: Self-pay | Admitting: *Deleted

## 2014-08-05 ENCOUNTER — Emergency Department (HOSPITAL_COMMUNITY): Payer: Medicaid Other

## 2014-08-05 DIAGNOSIS — D5700 Hb-SS disease with crisis, unspecified: Secondary | ICD-10-CM

## 2014-08-05 DIAGNOSIS — Z792 Long term (current) use of antibiotics: Secondary | ICD-10-CM | POA: Insufficient documentation

## 2014-08-05 DIAGNOSIS — Z79899 Other long term (current) drug therapy: Secondary | ICD-10-CM | POA: Diagnosis not present

## 2014-08-05 DIAGNOSIS — R1011 Right upper quadrant pain: Secondary | ICD-10-CM

## 2014-08-05 DIAGNOSIS — Z720 Tobacco use: Secondary | ICD-10-CM | POA: Insufficient documentation

## 2014-08-05 LAB — URINALYSIS, ROUTINE W REFLEX MICROSCOPIC
BILIRUBIN URINE: NEGATIVE
Glucose, UA: NEGATIVE mg/dL
Hgb urine dipstick: NEGATIVE
Ketones, ur: NEGATIVE mg/dL
Leukocytes, UA: NEGATIVE
Nitrite: NEGATIVE
Protein, ur: NEGATIVE mg/dL
SPECIFIC GRAVITY, URINE: 1.015 (ref 1.005–1.030)
UROBILINOGEN UA: 0.2 mg/dL (ref 0.0–1.0)
pH: 5.5 (ref 5.0–8.0)

## 2014-08-05 LAB — CBC WITH DIFFERENTIAL/PLATELET
BASOS ABS: 0 10*3/uL (ref 0.0–0.1)
Basophils Absolute: 0.1 10*3/uL (ref 0.0–0.1)
Basophils Relative: 0 % (ref 0–1)
Basophils Relative: 0 % (ref 0–1)
Eosinophils Absolute: 0.4 10*3/uL (ref 0.0–0.7)
Eosinophils Absolute: 0 10*3/uL (ref 0.0–0.7)
Eosinophils Relative: 3 % (ref 0–5)
Eosinophils Relative: 0 % (ref 0–5)
HCT: 22.5 % — ABNORMAL LOW (ref 39.0–52.0)
HCT: 21.6 % — ABNORMAL LOW (ref 39.0–52.0)
Hemoglobin: 7.7 g/dL — ABNORMAL LOW (ref 13.0–17.0)
Hemoglobin: 7.7 g/dL — ABNORMAL LOW (ref 13.0–17.0)
LYMPHS PCT: 5 % — AB (ref 12–46)
Lymphocytes Relative: 16 % (ref 12–46)
Lymphs Abs: 2.8 10*3/uL (ref 0.7–4.0)
Lymphs Abs: 1.1 10*3/uL (ref 0.7–4.0)
MCH: 32 pg (ref 26.0–34.0)
MCH: 32.5 pg (ref 26.0–34.0)
MCHC: 34.2 g/dL (ref 30.0–36.0)
MCHC: 35.6 g/dL (ref 30.0–36.0)
MCV: 93.4 fL (ref 78.0–100.0)
MCV: 91.1 fL (ref 78.0–100.0)
Monocytes Absolute: 1.7 10*3/uL — ABNORMAL HIGH (ref 0.1–1.0)
Monocytes Absolute: 2.5 10*3/uL — ABNORMAL HIGH (ref 0.1–1.0)
Monocytes Relative: 10 % (ref 3–12)
Monocytes Relative: 12 % (ref 3–12)
Neutro Abs: 12 10*3/uL — ABNORMAL HIGH (ref 1.7–7.7)
Neutro Abs: 17.4 10*3/uL — ABNORMAL HIGH (ref 1.7–7.7)
Neutrophils Relative %: 71 % (ref 43–77)
Neutrophils Relative %: 83 % — ABNORMAL HIGH (ref 43–77)
PLATELETS: 296 10*3/uL (ref 150–400)
Platelets: 337 10*3/uL (ref 150–400)
RBC: 2.41 MIL/uL — ABNORMAL LOW (ref 4.22–5.81)
RBC: 2.37 MIL/uL — ABNORMAL LOW (ref 4.22–5.81)
RDW: 26.2 % — ABNORMAL HIGH (ref 11.5–15.5)
RDW: 26.3 % — AB (ref 11.5–15.5)
WBC: 16.9 10*3/uL — ABNORMAL HIGH (ref 4.0–10.5)
WBC: 21 10*3/uL — AB (ref 4.0–10.5)

## 2014-08-05 LAB — COMPREHENSIVE METABOLIC PANEL
ALBUMIN: 4 g/dL (ref 3.5–5.2)
ALK PHOS: 97 U/L (ref 39–117)
ALT: 23 U/L (ref 0–53)
ANION GAP: 12 (ref 5–15)
AST: 47 U/L — AB (ref 0–37)
BUN: 14 mg/dL (ref 6–23)
CO2: 20 mEq/L (ref 19–32)
Calcium: 9 mg/dL (ref 8.4–10.5)
Chloride: 109 mEq/L (ref 96–112)
Creatinine, Ser: 0.95 mg/dL (ref 0.50–1.35)
GFR calc Af Amer: >90 mL/min (ref >90)
GFR calc non Af Amer: >90 mL/min (ref >90)
Glucose, Bld: 132 mg/dL — ABNORMAL HIGH (ref 70–99)
POTASSIUM: 4.7 meq/L (ref 3.7–5.3)
Sodium: 141 mEq/L (ref 137–147)
TOTAL PROTEIN: 7.3 g/dL (ref 6.0–8.3)
Total Bilirubin: 2.7 mg/dL — ABNORMAL HIGH (ref 0.3–1.2)

## 2014-08-05 LAB — LIPASE, BLOOD: LIPASE: 20 U/L (ref 11–59)

## 2014-08-05 LAB — RETICULOCYTES
RBC.: 2.4 MIL/uL — ABNORMAL LOW (ref 4.22–5.81)
Retic Ct Pct: >23 % — ABNORMAL HIGH (ref 0.4–3.1)

## 2014-08-05 MED ORDER — OXYCODONE-ACETAMINOPHEN 5-325 MG PO TABS: 1.0000 | ORAL_TABLET | Freq: Four times a day (QID) | ORAL | Status: DC | PRN

## 2014-08-05 MED ORDER — SODIUM CHLORIDE 0.9 % IV BOLUS (SEPSIS)
1000.0000 mL | Freq: Once | INTRAVENOUS | Status: AC
  Administered 2014-08-05: 1000 mL via INTRAVENOUS

## 2014-08-05 MED ORDER — HYDROMORPHONE HCL 1 MG/ML IJ SOLN
1.0000 mg | Freq: Once | INTRAMUSCULAR | Status: AC
  Administered 2014-08-05: 1 mg via INTRAVENOUS
  Filled 2014-08-05: qty 1

## 2014-08-05 MED ORDER — ACETAMINOPHEN 325 MG PO TABS
650.0000 mg | ORAL_TABLET | Freq: Once | ORAL | Status: AC
  Administered 2014-08-05: 650 mg via ORAL
  Filled 2014-08-05: qty 2

## 2014-08-05 MED ORDER — KETOROLAC TROMETHAMINE 30 MG/ML IJ SOLN
30.0000 mg | Freq: Once | INTRAMUSCULAR | Status: AC
  Administered 2014-08-05: 30 mg via INTRAVENOUS
  Filled 2014-08-05: qty 1

## 2014-08-05 MED ORDER — TRAMADOL HCL 50 MG PO TABS: 50.0000 mg | ORAL_TABLET | Freq: Four times a day (QID) | ORAL | Status: DC | PRN

## 2014-08-05 NOTE — ED Notes (Signed)
Patient transported to X-ray 

## 2014-08-05 NOTE — ED Provider Notes (Signed)
CSN: [id number]     Arrival date & time 08/05/14  1452 History   First MD Initiated Contact with Patient 08/05/14 1529     Chief Complaint  Patient presents with  . Sickle Cell Pain Crisis     (Consider location/radiation/quality/duration/timing/severity/associated sxs/prior Treatment) HPI Comments: Pt is a 18yo male with hx of sickle cell disease presenting to ED with a chief complaint of low back/tailbone pain and RUQ pain since yesterday.  Patient reports RUQ discomfort with associated vomiting.  Reports 3 episodes of non-bloody emesis today.  Denies constipation, diarrhea. Denies chest pain, shortness breath, cough, fever. Has not established care in [place], previous hematologist at [facility].  Patient is a 18 y.o. male presenting with sickle cell pain. The history is provided by the patient. No language interpreter was used.  Sickle Cell Pain Crisis Associated symptoms: no chest pain, no fever, no nausea and no shortness of breath     Past Medical History  Diagnosis Date  . Sickle cell crisis    Past Surgical History  Procedure Laterality Date  . Splenectomy     No family history on file. History  Substance Use Topics  . Smoking status: Current Every Day Smoker -- 0.50 packs/day    Types: Cigarettes  . Smokeless tobacco: Not on file  . Alcohol Use: No    Review of Systems  Constitutional: Negative for fever and chills.  Respiratory: Negative for shortness of breath.   Cardiovascular: Negative for chest pain.  Gastrointestinal: Positive for abdominal pain. Negative for nausea and diarrhea.  Genitourinary: Negative for dysuria and hematuria.  Musculoskeletal: Positive for back pain.      Allergies  Review of patient's allergies indicates no known allergies.  Home Medications   Prior to Admission medications   Medication Sig Start Date End Date Taking? Authorizing Provider  citalopram (CELEXA) 40 MG tablet Take 40 mg by mouth daily.   Yes Historical Provider, MD   folic acid (FOLVITE) 1 MG tablet Take 1 tablet (1 mg total) by mouth daily. 03/21/14  Yes Nicole Pisciotta, PA-C  hydroxyurea (HYDREA) 500 MG capsule Take 1 capsule (500 mg total) by mouth daily. May take with food to minimize GI side effects. 03/21/14  Yes Nicole Pisciotta, PA-C  hydrOXYzine (ATARAX/VISTARIL) 50 MG tablet Take 50 mg by mouth 3 (three) times daily as needed.   Yes Historical Provider, MD  ibuprofen (ADVIL,MOTRIN) 200 MG tablet Take 200-400 mg by mouth every 6 (six) hours as needed for mild pain.    Yes Historical Provider, MD  penicillin v potassium (VEETID) 250 MG tablet Take 250 mg by mouth 2 (two) times daily.   Yes Historical Provider, MD  penicillin v potassium (VEETID) 500 MG tablet Take 1 tablet (500 mg total) by mouth 3 (three) times daily. 03/21/14  Yes Nicole Pisciotta, PA-C  traMADol (ULTRAM) 50 MG tablet Take 1 tablet (50 mg total) by mouth every 6 (six) hours as needed. Patient taking differently: Take 50 mg by mouth every 6 (six) hours as needed for moderate pain.  08/05/14  Yes Noland Fordyce, PA-C  oxyCODONE-acetaminophen (PERCOCET/ROXICET) 5-325 MG per tablet 1 to 2 tabs PO q6hrs  PRN for pain Patient not taking: Reported on 08/05/2014 03/21/14   Elmyra Ricks Pisciotta, PA-C   BP 119/65 mmHg  Pulse 81  Temp(Src) 98.9 F (37.2 C) (Oral)  Resp 20  SpO2 100% Physical Exam  Constitutional: He appears well-developed and well-nourished. No distress.  HENT:  Head: Normocephalic and atraumatic.  Neck: Neck supple.  Cardiovascular: Normal rate and regular rhythm.   Pulmonary/Chest: Effort normal and breath sounds normal. No respiratory distress. He has no wheezes.  Abdominal: Soft. Bowel sounds are normal. There is tenderness in the right upper quadrant. There is no rebound, no guarding, no tenderness at McBurney's point and negative Murphy's sign.  Musculoskeletal: Normal range of motion.       Back:  Neurological: He is alert. GCS eye subscore is 4. GCS verbal subscore is 5.  GCS motor subscore is 6.  Appears sleepy  Skin: Skin is warm and dry.  Psychiatric: He has a normal mood and affect.  Nursing note and vitals reviewed.   ED Course  Procedures (including critical care time) Labs Review Labs Reviewed  CBC WITH DIFFERENTIAL - Abnormal; Notable for the following:    WBC 21.0 (*)    RBC 2.37 (*)    Hemoglobin 7.7 (*)    HCT 21.6 (*)    RDW 26.3 (*)    Neutrophils Relative % 83 (*)    Lymphocytes Relative 5 (*)    Neutro Abs 17.4 (*)    Monocytes Absolute 2.5 (*)    All other components within normal limits  COMPREHENSIVE METABOLIC PANEL - Abnormal; Notable for the following:    Glucose, Bld 132 (*)    AST 47 (*)    Total Bilirubin 2.7 (*)    All other components within normal limits  RETICULOCYTES - Abnormal; Notable for the following:    Retic Ct Pct >23.0 (*)    RBC. 2.40 (*)    All other components within normal limits  URINALYSIS, ROUTINE W REFLEX MICROSCOPIC - Abnormal; Notable for the following:    Color, Urine AMBER (*)    All other components within normal limits  LIPASE, BLOOD    Imaging Review Dg Chest 2 View  08/05/2014   CLINICAL DATA:  18 year old male with left-sided chest pain and shortness of breath. Sickle cell disease.  EXAM: CHEST  2 VIEW  COMPARISON:  01/09/2014 and prior chest radiographs dating back to 09/08/2006  FINDINGS: Upper limits normal heart size noted with mildly increased vascularity.  Minimal basilar atelectasis noted in this mildly low volume film.  There is no evidence of focal airspace disease, pulmonary edema, suspicious pulmonary nodule/mass, pleural effusion, or pneumothorax. No acute bony abnormalities are identified.  IMPRESSION: Minimal bibasilar atelectasis without other evidence of acute abnormality.   Electronically Signed   By: Hassan Rowan M.D.   On: 08/05/2014 16:48     EKG Interpretation None      MDM   Final diagnoses:  RUQ pain  Sickle cell anemia with pain   Patient presents with sickle  cell disease and sickle cell pain and lower back and abdomen. Patient appears sleepy on exam, Dilaudid ordered prior to seeing fentanyl 150 g given by EMS. Plan to hold on narcotics until the patient is more alert. CMP shows bilirubin 2.7, stable from 6 months ago. CBC shows leukocytosis 21 similar to yesterday. He will of 7.7, same as yesterday. 1745 Re-eval pt sleeping, still appers sleepy.  Reports resolution of RUQ and low back pain.  Reports bilateral leg discomfort. Toradol given. Dr. Jeneen Rinks also evaluated the patient during this encounter. Pt reported symptom impreovement and can be followed as an out patient. Agrees patient can follow-up with a primary care outpatient.Awaiting lipase and UA. Call to lab for lipase, delaying in disposition due to lab issue. Lipase negative. Dr. Jeneen Rinks also evaluated the patient during this encounter. Agrees patient  can follow-up with a primary care outpatient. Meds given in ED:  Medications  HYDROmorphone (DILAUDID) injection 1 mg (1 mg Intravenous Given 08/05/14 1612)  ketorolac (TORADOL) 30 MG/ML injection 30 mg (30 mg Intravenous Given 08/05/14 1835)  sodium chloride 0.9 % bolus 1,000 mL (0 mLs Intravenous Stopped 08/05/14 1927)    New Prescriptions   OXYCODONE-ACETAMINOPHEN (PERCOCET) 5-325 MG PER TABLET    Take 1 tablet by mouth every 6 (six) hours as needed.     Harvie Heck, PA-C 08/05/14 2354  Tanna Furry, MD 08/10/14 [phone]

## 2014-08-05 NOTE — ED Notes (Signed)
PER EMS- pt picked up from home c/o sickle crisis pain in legs, lower back, and sides. PTA given 198mcg fentanyl, 20g IV l hand.  Reports pain 1/10.  Pt seen here yesterday.  Arrived to ED alert and oriented per baseline.

## 2014-08-05 NOTE — Discharge Instructions (Signed)
°Emergency Department Resource Guide °1) Find a Doctor and Pay Out of Pocket °Although you won't have to find out who is covered by your insurance plan, it is a good idea to ask around and get recommendations. You will then need to call the office and see if the doctor you have chosen will accept you as a new patient and what types of options they offer for patients who are self-pay. Some doctors offer discounts or will set up payment plans for their patients who do not have insurance, but you will need to ask so you aren't surprised when you get to your appointment. ° °2) Contact Your Local Health Department °Not all health departments have doctors that can see patients for sick visits, but many do, so it is worth a call to see if yours does. If you don't know where your local health department is, you can check in your phone book. The CDC also has a tool to help you locate your state's health department, and many state websites also have listings of all of their local health departments. ° °3) Find a Walk-in Clinic °If your illness is not likely to be very severe or complicated, you may want to try a walk in clinic. These are popping up all over the country in pharmacies, drugstores, and shopping centers. They're usually staffed by nurse practitioners or physician assistants that have been trained to treat common illnesses and complaints. They're usually fairly quick and inexpensive. However, if you have serious medical issues or chronic medical problems, these are probably not your best option. ° °No Primary Care Doctor: °- Call [facility] at  832-8000 - they can help you locate a primary care doctor that  accepts your insurance, provides certain services, etc. °- Physician Referral Service- 1-800-533-3463 ° °Chronic Pain Problems: °Organization         Address  Phone   Notes  °[facility]  (336) 297-2271 Patients need to be referred by their primary care doctor.  ° °Medication  Assistance: °Organization         Address  Phone   Notes  °[place] [facility] [address] °[place], [place] [address] (336) 641-8030 --Must be a resident of [place] °-- Must have NO insurance coverage whatsoever (no Medicaid/ Medicare, etc.) °-- The pt. MUST have a primary care doctor that directs their care regularly and follows them in the community °  °[facility]  (866) 331-1348   °[employer]  (888) 892-1162   ° °Agencies that provide inexpensive medical care: °Organization         Address  Phone   Notes  °[facility]  (336) 832-8035   °[facility]    (336) 832-7272   °[facility] [address] °[place], [place] [address] (336) 832-4777   °[facility] [address], °[place] (336) 271-4999   °[facility]    (336) 373-0678   °[facility]    (336) 272-1050   °[facility] ° [address], [place] Phone:  (336) 832-4444, Fax:  (336) 832-4440 Hours of Operation:  9 am - 6 pm, M-F.  Also accepts Medicaid/Medicare and self-pay.  °[facility] ° [address],  Phone: (336) 832-3150, Fax: (336) 832-3151. Hours of Operation:  8:30 am - 5:30 pm, M-F.  Also accepts Medicaid and self-pay.  °[facility] [place] [address], [place] Phone: (336) 878-6027   °[facility] [address], [place], [place] (336)723-1848, Ext. 123 Mondays & Thursdays: 7-9 AM.  First 15 patients are seen on a first come, first serve basis. °  ° °Medicaid-accepting [place] Providers: ° °Organization         Address  Phone   Notes  °[facility] [address], [place] (336) 641-2100 Also accepts self-pay patients.  °[facility] [address], [place] ° (336) 856-9996   °[facility] [address], [place]  (336) 288-8857   °[facility] [address] [place] [address], [place] (336) 299-7000   °Veita Bland [address], [place]  ° (336) 373-1557 Only accepts [place] Access Medicaid patients after they have their name applied to their card.  ° °Self-Pay (no insurance) in [place]: ° °Organization         Address  Phone   Notes  °Sickle Cell Patients, [facility] [address], [place] (336) 832-1970   °[facility] [address], [place] (336) 832-4400   °[facility] [place] ° [address], [place] (336) 992-4800   °[facility]/Dr. Osei-Bonsu ° [address] [place] [address], [place] or [address], [place] (336) [phone] Phone number for both [place] and [place] locations is the same.  °[facility] [address], [place] (336) 299-0000   °[facility] [place] [address] [place] [address], [place] or [address] (336) 852-7530 °(336) 878-2260   °[facility] [address], [place] (336) 350-1642, phone; (336) 294-5005, fax Sees patients 1st and 3rd Saturday of every month.  Must not qualify for public or private insurance (i.e. Medicaid, Medicare, [place] Health Choice, Veterans' Benefits) • Household income should be no more than 200% of the poverty level •The clinic cannot treat you if you are pregnant or think you are pregnant • Sexually transmitted diseases are not treated at the clinic.  ° ° °Dental Care: °Organization         Address  Phone  Notes  °[place] [facility] [address], [place] (336) 641-6152 Accepts children up to age 21 who are enrolled in Medicaid or [place] Health Choice; pregnant women with a Medicaid card; and children who have applied for Medicaid or [place] Health Choice, but were declined, whose parents can pay a reduced fee at time of service.  °[place]  [facility] [place]  [address], [place] (336) 641-7733 Accepts children up to age 21 who are enrolled in Medicaid or [place] Health Choice; pregnant women with a Medicaid card; and children who have applied for Medicaid or  Health Choice, but were declined, whose parents can pay a reduced fee at time of service.  °[facility] ° [address],  (336) 641-4533 Patients are seen by appointment only. Walk-ins are not accepted. [facility] will see patients 18 years of age and older. °Monday - Tuesday (8am-5pm) °Most Wednesdays (8:30-5pm) °$30 per visit, cash only  °[facility] ° [address], [place] (336) 641-4533 Patients are seen by appointment only. Walk-ins are not accepted. [facility] will see patients 18 years of age and older. °One   Wednesday Evening (Monthly: Volunteer Based).  $30 per visit, cash only  °[facility]  (919) 537-3737 for adults; Children under age 4, call Graduate Pediatric Dentistry at (919) [phone]. Children aged 4-14, please call (919) [phone] to request a pediatric application. ° Dental services are provided in all areas of dental care including fillings, crowns and bridges, complete and partial dentures, implants, gum treatment, root canals, and extractions. Preventive care is also provided. Treatment is provided to both adults and children. °Patients are selected via a lottery and there is often a waiting list. °  °[facility] [address], °[place] ° (336) 763-8833 [email or web address] °  °[facility] [address], [place], [place] (336)723-1848, Ext. 123 Second and Fourth Thursday of each month, opens at 6:30 AM; Clinic ends at 9 AM.  Patients are seen on a first-come first-served basis, and a limited number are seen during each clinic.  ° °[facility] ° [address], [place], [place] (336) 723-7904    Eligibility Requirements °You must have lived in [place], [place], or [place] counties for at least the last three months. °  You cannot be eligible for state or federal sponsored healthcare insurance, including Veterans Administration, Medicaid, or Medicare. °  You generally cannot be eligible for healthcare insurance through your employer.  °  How to apply: °Eligibility screenings are held every Tuesday and Wednesday afternoon from 1:00 pm until 4:00 pm. You do not need an appointment for the interview!  °[facility] [address], [place], [place] 336-631-2330   °[facility] [facility]  336-342-8273   °[facility]  336-703-3100   °[facility]  336-570-6415   ° °Behavioral Health Resources in the Community: °Intensive Outpatient Programs °Organization         Address  Phone  Notes  °[facility] [address], [place], [place] 336-878-6098   °[facility] [address], [place], [place] 336-832-9800   °[facility] [address], [place], [place] ° 336-882-2125   °[facility] [address],  °[place], [place] 1-800-853-5163 or 336-641-4981   °Substance Abuse Resources °Organization         Address  Phone  Notes  °[facility]  336-882-2125   °[facility]  336-784-9470   °[facility]  336-285-9073   °[facility]  336-845-3988   °Residential & Outpatient Substance Abuse Program  1-800-659-3381   °Psychological Services °Organization         Address  Phone  Notes  ° Health  336- 832-9600   °[facility]  336- 378-7881   °[facility] [address], [place] 1-800-853-5163 or 336-641-4981   ° °Mobile Crisis Teams °Organization         Address  Phone  Notes  °[facility], Mobile Crisis Care Unit  1-877-626-1772   °Assertive °[facility] ° [address]  [place], [place] 336-834-9664   °Sharon DeEsch [address] °[place] 336-554-5454   ° °Self-Help/Support Groups °Organization         Address  Phone             Notes  °[facility] [place] - variety of support groups  336- 373-1402 Call for more information  °Narcotics Anonymous (NA), [facility] [address], °[place]   2 meetings at this location  ° °  Residential Treatment Programs °Organization         Address  Phone  Notes  °[facility] [address],    °[place]  1-866-801-8205   °[facility] ° [address], [place], [place] 704-293-8524   °[facility] [address], [place] 336-845-3988 Admissions: 8am-3pm M-F  °[facility] [address]    °[place], [place] 336-841-1104   °[facility] [address], [place], [place] 336-379-7146   °[facility] [address]  °[place], [place] 336-285-9073   °[facility] Intensive Outpatient [address], [place], [place] 336-852-3033   °[facility] [address]  °[place], [place] 1-877-615-2722 or 336-784-9470   °[facility] [address] [place], [place] 336-227-7417 Accepts Medicaid  °[facility] [address]  ° [place] 1-800-659-3381 Substance Abuse/Addiction Treatment  ° °[place] Behavioral Health Resources °Organization         Address  Phone  Notes  °[facility]  (888) 581-9988   °Julie Brannon, PhD [address] [place], [place]   (336) 349-5553 or (336) 951-0000   °[facility]   [address] °[place], [place] (336) 349-4454   °[facility] [address], [place], [place] (336) 342-8316 Insurance/Medicaid/sponsorship through [facility]  °[facility] [address]                                    [place], [place] (336) 342-8316 Therapy/tele-psych/case    °[facility] [address]  ° [place], [place] (336) 349-2233    °Dr. Arfeen  (336) 349-4544   °[facility] [place]  [employer] [place] [facility] 1) [address], [place] °2) [address], [place] °3)  [address], [place] (336) 349-3220 °(336) 342-7768 ° °(336) 342-8140   °[place] [facility] (336) 342-1394 or (336) 342-3537 (After Hours)    ° ° °

## 2014-08-05 NOTE — ED Notes (Signed)
Pt is aware that a urine sample is needed.  

## 2014-08-05 NOTE — ED Notes (Signed)
Pt reports that he does not want to drink anything as directed per the fluid challenge because his pain is starting to come back. PA Hilda Blades made aware and more medication ordered for patient prior to discharge.

## 2014-08-05 NOTE — ED Notes (Signed)
Awake. Verbally responsive. Resp even and unlabored. ABC's intact. IV patent and intact.

## 2014-08-05 NOTE — ED Notes (Signed)
Patient c/o pain in epigastric area, back and left side.  Patient also c/o N/V earlier - but not currently.  Patient's lung sounds are clear, no S3/S4 or split heard.  +2 radial and dorsalis pedis pulses palpated.  Bowel sounds present, patient's abdomen is soft and non-tender to palpation.  Pt is exceptionally somnolent due to the 150 mcg of Fentanyl EMS gave him en route.

## 2014-08-05 NOTE — ED Notes (Signed)
Bed: CB44 Expected date: 08/05/14 Expected time: 2:49 PM Means of arrival: Ambulance Comments: Sickle Cell

## 2014-08-05 NOTE — ED Notes (Signed)
Awake. Verbally responsive. Resp even and unlabored. ABC's intact. NAD noted.

## 2014-08-05 NOTE — ED Notes (Signed)
Resting quitely with eyes closed. Easily arousable. Verbally responsive. Resp even and unlabored. ABC's intact.

## 2014-08-05 NOTE — Discharge Instructions (Signed)
Call for a follow up appointment with a Family or Primary Care Provider.  Return if Symptoms worsen.   Take medication as prescribed.    Emergency Department Resource Guide 1) Find a Doctor and Pay Out of Pocket Although you won't have to find out who is covered by your insurance plan, it is a good idea to ask around and get recommendations. You will then need to call the office and see if the doctor you have chosen will accept you as a new patient and what types of options they offer for patients who are self-pay. Some doctors offer discounts or will set up payment plans for their patients who do not have insurance, but you will need to ask so you aren't surprised when you get to your appointment.  2) Contact Your Local Health Department Not all health departments have doctors that can see patients for sick visits, but many do, so it is worth a call to see if yours does. If you don't know where your local health department is, you can check in your phone book. The CDC also has a tool to help you locate your state's health department, and many state websites also have listings of all of their local health departments.  3) Find a [facility] If your illness is not likely to be very severe or complicated, you may want to try a walk in clinic. These are popping up all over the country in pharmacies, drugstores, and shopping centers. They're usually staffed by nurse practitioners or physician assistants that have been trained to treat common illnesses and complaints. They're usually fairly quick and inexpensive. However, if you have serious medical issues or chronic medical problems, these are probably not your best option.  No Primary Care Doctor: - Call Health Connect at  [phone] - they can help you locate a primary care doctor that  accepts your insurance, provides certain services, etc. - Physician Referral Service- [phone]  Chronic Pain Problems: Organization         Address  Phone    Notes  [facility]  [phone] Patients need to be referred by their primary care doctor.   Medication Assistance: Organization         Address  Phone   Notes  [facility] Medication [facility] [address] [place], [place] [address] [phone] --Must be a resident of [facility] -- Must have NO insurance coverage whatsoever (no Medicaid/ Medicare, etc.) -- The pt. MUST have a primary care doctor that directs their care regularly and follows them in the community   Med[facility]ist  [phone]   [employer]  [phone]    Agencies that provide inexpensive medical care: Organization         Address  Phone   Notes  [place]  [phone]   [facility]    [phone]   [facility] [place], [place] [address] [phone]   [place] [address], [place] [phone]   Planned Parenthood    [phone]   [facility]    [phone]   [place] and [place] [address], [place] Phone:  [phone], Fax:  [phone] Hours of Operation:  9 am - 6 pm, M-F.  Also accepts Medicaid/Medicare and self-pay.  [facility] for [place] [address], [address], [place]  Phone: [phone], Fax: [phone]. Hours of Operation:  8:30 am - 5:30 pm, M-F.  Also accepts Medicaid and self-pay.  [facility] [place] [address] [place] Phone: [phone]   [place], [place], [place] [phone], Ext. [phone] Mondays & Thursdays: 7-9 AM.  First 15 patients are seen on a first come, first serve basis.    [place] Providers:  Organization         Address  Phone   Notes  [facility] [address], [place] [phone] Also accepts self-pay patients.  [facility]  [address], [place]  [phone]   [place], [address], [place] [phone]   [facility] [address], [place] [phone]   Lucianne Lei [address], [place]   [phone] Only accepts [place] Access [place] patients after they have their name applied to their card.   Self-Pay (no insurance) in [facility]:  Organization         Address  Phone   Notes  Sickle Cell Patients, [facility] [place] [phone]   [facility] Urgent Care [place] [phone]   [facility] [place]  [place], [place],  [phone]   [facility]/Dr. Osei-Bonsu  [address] [place] or [address], [place] [phone] Phone number for both [place] and [place] locations is the same.  Urgent Medical and [facility] [address], [place] [phone]   [facility] [address] [place] or [address] [phone]   [facility] [address] [place] [phone], phone; [phone], fax Sees patients 1st and 3rd Saturday of every month.  Must not qualify for public or private insurance (i.e. Medicaid, Medicare, [place] Health Choice, Veterans' Benefits)  Household income should be no more than 200% of the poverty level The clinic cannot treat you if you are pregnant or think you are pregnant  Sexually transmitted diseases are not treated at the clinic.    Dental Care: Organization         Address  Phone  Notes  [facility] Department of [facility] [place] [phone] Accepts children up to age 43 who are enrolled in [place] or [place]; pregnant women with a Medicaid card; and children who have  applied for Medicaid or [place] Health Choice, but were declined, whose parents can pay a reduced fee at time of service.  [facility] Department of [facility]  [address], [place] [phone] Accepts children up to age 81 who are enrolled in [place] or [place]; pregnant women with a Medicaid card; and children who have applied for Medicaid or [place] Health Choice, but were declined, whose parents can pay a reduced fee at time of service.  [facility]  [place] [phone] Patients are seen by appointment only. Walk-ins are not accepted. [place] will see patients 71 years of age and older. Monday - Tuesday (8am-5pm) Most Wednesdays (8:30-5pm) $30 per visit, cash only  [place]  [address]  Dr, [facility] [phone] Patients are seen by appointment only. Walk-ins are not accepted. [place] will see patients 54 years of age and older. One Wednesday Evening (Monthly: Volunteer Based).  $30 per visit, cash only  [place]  [phone] for adults; Children under age 58, call Graduate Pediatric Dentistry at [phone]. Children aged 29-14, please call [phone] to request a pediatric application.  Dental services are provided in all areas of dental care including fillings, crowns and bridges, complete and partial dentures, implants, gum treatment, root canals, and extractions. Preventive care is also provided. Treatment is provided to both adults and children. Patients are selected via a lottery and there is often a waiting list.   [facility] [address] [place]  [phone] [email or web address]   [facility] [address] [place], [place] [phone], Ext. [phone] Second and Fourth Thursday of each month, opens at 6:30 AM; Clinic ends at 9 AM.  Patients are seen on a first-come first-served basis, and a  limited number are seen during each clinic.   [facility]  [address] Hillard Danker [place], [place] [phone]   Eligibility Requirements You must have lived in [place], [place], or [place] counties for at least the last three months.   You cannot be eligible for state or federal sponsored [employer], including [employer], [place], or [employer].   You generally cannot be eligible for healthcare insurance through your employer.    How to apply: Eligibility screenings are held every Tuesday and Wednesday afternoon from 1:00 pm until 4:00 pm. You do not need an appointment for the interview!  [facility] [address], [place], [place]   [place]  [place] Department  [place]  [phone]    Behavioral Health Resources in the Community: Intensive Outpatient Programs Organization         Address  Phone  Notes  [facility]. [address] [place], [place] [phone]   [facility] Outpatient [address], [place], [place]   ADS: [facility] Drug S[facility] [address] [place], [place]   [place] [address]  [place], [place] or [phone]   Substance Abuse Resources Organization         Address  Phone  Notes  [facility] Drug Services  [phone]   [place]  [phone]   [facility] [place]   Chinita Pester  [phone]   Residential & Outpatient Substance Abuse Program  [phone]   Psychological Services Organization         Address  Phone  Notes  [facility] [place]  [place]  [phone]   [place] [address], [place] or [phone]    Mobile Crisis Teams Organization          Address  Phone  Notes  [facility], Mobile Crisis Care Unit  [phone]   [facility]  [address] [place], [place]   [place] [address] [place] [phone]    Self-Help/Support Groups Organization         Address  Phone             Notes  [place]. of [place] - variety of  support groups  336- [phone] Call for more information  Narcotics Anonymous (NA), [facility] [address], [employer] [place]  2 meetings at this location   Residential Facilities manager         Address  Phone  Notes  [facility] [place],    [place]  [phone]   [facility]  [address], [place] [address], [place], [place]   [place], [place] [phone] Admissions: 8am-3pm M-F  Incentives Substance [place] [address],    [place], [place] [phone]   [facility] [address] [place], [place], [place]   [facility] [address]  [place], [place]   [facility] - Intensive Outpatient [address] Kristeen [address], [place], [place]   [facility] ([place] [place].,  [place], [place] [phone] or [phone]   [facility] Services (RTS) [address], [place], [place] Accepts Medicaid  [facility] [place] [address]  [place] [phone] Substance Abuse/Addiction Treatment   [facility] Organization         Address  Phone  Notes  [facility]  [phone]   Domenic Schwab, PhD [address] Arlis Porta [place], [place]   [phone] or [phone]   [place], [place] [phone]   [facility] [address] [place], [place] [phone] Insurance/Medicaid/sponsorship  through [facility] and Families [address], [address] [place]                                    [employer], [place] [phone] [place] [address], [place] [phone]    Dr. Adele Schilder  [phone]   [facility] Dept. 1) [address],  2) [place] 3)  [place] [address], [place] [phone]  [phone]   [place] [phone] or [phone] (After Hours)

## 2014-08-05 NOTE — ED Provider Notes (Signed)
Patient seen and evaluated. Resting and sleeping as I enter the room. Discusses evaluation with him. Studies show appropriate lab values. No acute upper values. Not febrile. Not hypoxemic. Plan is discharge home.  Tanna Furry, MD 08/05/14 [phone]

## 2014-08-05 NOTE — ED Notes (Signed)
RN gave pt bolus and will wait 15 more minutes to ask for a sample.

## 2014-08-08 ENCOUNTER — Encounter (HOSPITAL_COMMUNITY): Payer: Self-pay | Admitting: Emergency Medicine

## 2014-08-08 ENCOUNTER — Inpatient Hospital Stay (HOSPITAL_COMMUNITY)
Admission: EM | Admit: 2014-08-08 | Discharge: 2014-08-14 | DRG: 193 | Disposition: A | Discharge status: Home / Self Care | Payer: Medicaid Other | Attending: Internal Medicine | Admitting: Internal Medicine

## 2014-08-08 ENCOUNTER — Emergency Department (HOSPITAL_COMMUNITY): Payer: Medicaid Other

## 2014-08-08 DIAGNOSIS — Z9081 Acquired absence of spleen: Secondary | ICD-10-CM | POA: Diagnosis present

## 2014-08-08 DIAGNOSIS — R0902 Hypoxemia: Secondary | ICD-10-CM

## 2014-08-08 DIAGNOSIS — F17210 Nicotine dependence, cigarettes, uncomplicated: Secondary | ICD-10-CM | POA: Diagnosis present

## 2014-08-08 DIAGNOSIS — J189 Pneumonia, unspecified organism: Secondary | ICD-10-CM | POA: Diagnosis present

## 2014-08-08 DIAGNOSIS — D638 Anemia in other chronic diseases classified elsewhere: Secondary | ICD-10-CM | POA: Insufficient documentation

## 2014-08-08 DIAGNOSIS — Y95 Nosocomial condition: Secondary | ICD-10-CM | POA: Diagnosis present

## 2014-08-08 DIAGNOSIS — Z79899 Other long term (current) drug therapy: Secondary | ICD-10-CM | POA: Diagnosis not present

## 2014-08-08 DIAGNOSIS — G8929 Other chronic pain: Secondary | ICD-10-CM | POA: Diagnosis present

## 2014-08-08 DIAGNOSIS — D5700 Hb-SS disease with crisis, unspecified: Secondary | ICD-10-CM

## 2014-08-08 DIAGNOSIS — D5701 Hb-SS disease with acute chest syndrome: Secondary | ICD-10-CM | POA: Diagnosis present

## 2014-08-08 DIAGNOSIS — J9601 Acute respiratory failure with hypoxia: Secondary | ICD-10-CM | POA: Diagnosis present

## 2014-08-08 DIAGNOSIS — M545 Low back pain: Secondary | ICD-10-CM | POA: Diagnosis present

## 2014-08-08 HISTORY — DX: Other chronic pain: G89.29

## 2014-08-08 HISTORY — DX: Low back pain: M54.5

## 2014-08-08 HISTORY — DX: Low back pain, unspecified: M54.50

## 2014-08-08 LAB — CREATININE, SERUM
Creatinine, Ser: 1.05 mg/dL (ref 0.50–1.35)
GFR calc Af Amer: >90 mL/min (ref >90)

## 2014-08-08 LAB — CBC WITH DIFFERENTIAL/PLATELET
BASOS PCT: 0 % (ref 0–1)
Basophils Absolute: 0 K/uL (ref 0.0–0.1)
Basophils Absolute: 0 10*3/uL (ref 0.0–0.1)
Basophils Relative: 0 % (ref 0–1)
EOS PCT: 0 % (ref 0–5)
Eosinophils Absolute: 0 K/uL (ref 0.0–0.7)
Eosinophils Absolute: 0 10*3/uL (ref 0.0–0.7)
Eosinophils Relative: 0 % (ref 0–5)
HCT: 15.9 % — ABNORMAL LOW (ref 39.0–52.0)
HCT: 12.8 % — ABNORMAL LOW (ref 39.0–52.0)
HEMOGLOBIN: 4.5 g/dL — AB (ref 13.0–17.0)
Hemoglobin: 5.4 g/dL — CL (ref 13.0–17.0)
LYMPHS ABS: 2.5 10*3/uL (ref 0.7–4.0)
Lymphocytes Relative: 10 % — ABNORMAL LOW (ref 12–46)
Lymphocytes Relative: 15 % (ref 12–46)
Lymphs Abs: 1.8 K/uL (ref 0.7–4.0)
MCH: 31 pg (ref 26.0–34.0)
MCH: 31.5 pg (ref 26.0–34.0)
MCHC: 34 g/dL (ref 30.0–36.0)
MCHC: 35.2 g/dL (ref 30.0–36.0)
MCV: 91.4 fL (ref 78.0–100.0)
MCV: 89.5 fL (ref 78.0–100.0)
Monocytes Absolute: 2.6 K/uL — ABNORMAL HIGH (ref 0.1–1.0)
Monocytes Absolute: 2.3 10*3/uL — ABNORMAL HIGH (ref 0.1–1.0)
Monocytes Relative: 14 % — ABNORMAL HIGH (ref 3–12)
Monocytes Relative: 14 % — ABNORMAL HIGH (ref 3–12)
NEUTROS ABS: 11.6 10*3/uL — AB (ref 1.7–7.7)
Neutro Abs: 14 K/uL — ABNORMAL HIGH (ref 1.7–7.7)
Neutrophils Relative %: 76 % (ref 43–77)
Neutrophils Relative %: 71 % (ref 43–77)
PLATELETS: 300 10*3/uL (ref 150–400)
Platelets: 359 K/uL (ref 150–400)
RBC: 1.74 MIL/uL — ABNORMAL LOW (ref 4.22–5.81)
RBC: 1.43 MIL/uL — AB (ref 4.22–5.81)
RDW: 23.3 % — ABNORMAL HIGH (ref 11.5–15.5)
RDW: 22.1 % — ABNORMAL HIGH (ref 11.5–15.5)
WBC: 18.4 K/uL — ABNORMAL HIGH (ref 4.0–10.5)
WBC: 16.4 10*3/uL — ABNORMAL HIGH (ref 4.0–10.5)
nRBC: 23 /100 WBC — ABNORMAL HIGH

## 2014-08-08 LAB — RETICULOCYTES
RBC.: 1.74 MIL/uL — AB (ref 4.22–5.81)
RBC.: 1.43 MIL/uL — ABNORMAL LOW (ref 4.22–5.81)
RBC.: 1.92 MIL/uL — AB (ref 4.22–5.81)
RETIC COUNT ABSOLUTE: 286 10*3/uL — AB (ref 19.0–186.0)
RETIC CT PCT: 18.6 % — AB (ref 0.4–3.1)
Retic Count, Absolute: 357.1 10*3/uL — ABNORMAL HIGH (ref 19.0–186.0)
Retic Ct Pct: 20 % — ABNORMAL HIGH (ref 0.4–3.1)

## 2014-08-08 LAB — STREP PNEUMONIAE URINARY ANTIGEN: Strep Pneumo Urinary Antigen: NEGATIVE

## 2014-08-08 LAB — BLOOD GAS, VENOUS
ACID-BASE DEFICIT: 0.3 mmol/L (ref 0.0–2.0)
Bicarbonate: 23.5 mEq/L (ref 20.0–24.0)
O2 Content: 2 L/min
O2 Saturation: 71 %
PATIENT TEMPERATURE: 100.6
TCO2: 23.1 mmol/L (ref 0–100)
pCO2, Ven: 38 mmHg — ABNORMAL LOW (ref 45.0–50.0)
pH, Ven: 7.413 — ABNORMAL HIGH (ref 7.250–7.300)

## 2014-08-08 LAB — HEMOGLOBIN AND HEMATOCRIT, BLOOD
HCT: 17 % — ABNORMAL LOW (ref 39.0–52.0)
Hemoglobin: 5.8 g/dL — CL (ref 13.0–17.0)

## 2014-08-08 LAB — COMPREHENSIVE METABOLIC PANEL
ALBUMIN: 3 g/dL — AB (ref 3.5–5.2)
ALT: 26 U/L (ref 0–53)
AST: 32 U/L (ref 0–37)
Alkaline Phosphatase: 107 U/L (ref 39–117)
Anion gap: 10 (ref 5–15)
BUN: 18 mg/dL (ref 6–23)
CO2: 23 meq/L (ref 19–32)
Calcium: 8.6 mg/dL (ref 8.4–10.5)
Chloride: 104 mEq/L (ref 96–112)
Creatinine, Ser: 1 mg/dL (ref 0.50–1.35)
GFR calc Af Amer: >90 mL/min (ref >90)
Glucose, Bld: 118 mg/dL — ABNORMAL HIGH (ref 70–99)
POTASSIUM: 4.3 meq/L (ref 3.7–5.3)
Sodium: 137 mEq/L (ref 137–147)
Total Bilirubin: 2.2 mg/dL — ABNORMAL HIGH (ref 0.3–1.2)
Total Protein: 6.6 g/dL (ref 6.0–8.3)

## 2014-08-08 LAB — BASIC METABOLIC PANEL WITH GFR
Anion gap: 12 (ref 5–15)
BUN: 17 mg/dL (ref 6–23)
CO2: 24 meq/L (ref 19–32)
Calcium: 9.4 mg/dL (ref 8.4–10.5)
Chloride: 102 meq/L (ref 96–112)
Creatinine, Ser: 1.01 mg/dL (ref 0.50–1.35)
GFR calc Af Amer: >90 mL/min (ref >90)
GFR calc non Af Amer: >90 mL/min (ref >90)
Glucose, Bld: 118 mg/dL — ABNORMAL HIGH (ref 70–99)
Potassium: 4.4 meq/L (ref 3.7–5.3)
Sodium: 138 meq/L (ref 137–147)

## 2014-08-08 LAB — LACTATE DEHYDROGENASE: LDH: 736 U/L — ABNORMAL HIGH (ref 94–250)

## 2014-08-08 LAB — PREPARE RBC (CROSSMATCH)

## 2014-08-08 LAB — MAGNESIUM: Magnesium: 2.2 mg/dL (ref 1.5–2.5)

## 2014-08-08 LAB — HIV ANTIBODY (ROUTINE TESTING W REFLEX): HIV: NONREACTIVE

## 2014-08-08 LAB — MRSA PCR SCREENING: MRSA BY PCR: NEGATIVE

## 2014-08-08 LAB — I-STAT CG4 LACTIC ACID, ED: Lactic Acid, Venous: 0.82 mmol/L (ref 0.5–2.2)

## 2014-08-08 MED ORDER — LEVOFLOXACIN IN D5W 750 MG/150ML IV SOLN
750.0000 mg | Freq: Once | INTRAVENOUS | Status: AC
  Administered 2014-08-08: 750 mg via INTRAVENOUS
  Filled 2014-08-08: qty 150

## 2014-08-08 MED ORDER — VANCOMYCIN HCL IN DEXTROSE 1-5 GM/200ML-% IV SOLN
1000.0000 mg | Freq: Three times a day (TID) | INTRAVENOUS | Status: DC
  Administered 2014-08-08 – 2014-08-09 (×4): 1000 mg via INTRAVENOUS
  Filled 2014-08-08 (×5): qty 200

## 2014-08-08 MED ORDER — SODIUM CHLORIDE 0.9 % IV SOLN
25.0000 mg | INTRAVENOUS | Status: DC | PRN
  Filled 2014-08-08: qty 0.5

## 2014-08-08 MED ORDER — METHYLPREDNISOLONE SODIUM SUCC 125 MG IJ SOLR: 125.0000 mg | Freq: Once | INTRAMUSCULAR | Status: DC

## 2014-08-08 MED ORDER — NALOXONE HCL 0.4 MG/ML IJ SOLN: 0.4000 mg | INTRAMUSCULAR | Status: DC | PRN

## 2014-08-08 MED ORDER — SODIUM CHLORIDE 0.9 % IJ SOLN: 9.0000 mL | INTRAMUSCULAR | Status: DC | PRN

## 2014-08-08 MED ORDER — SENNOSIDES-DOCUSATE SODIUM 8.6-50 MG PO TABS
1.0000 | ORAL_TABLET | Freq: Two times a day (BID) | ORAL | Status: DC
  Administered 2014-08-08 – 2014-08-14 (×12): 1 via ORAL
  Filled 2014-08-08 (×16): qty 1

## 2014-08-08 MED ORDER — NICOTINE 14 MG/24HR TD PT24
14.0000 mg | MEDICATED_PATCH | Freq: Every day | TRANSDERMAL | Status: DC
  Administered 2014-08-08 – 2014-08-14 (×6): 14 mg via TRANSDERMAL
  Filled 2014-08-08 (×7): qty 1

## 2014-08-08 MED ORDER — DEXTROSE 5 % IV SOLN
1.0000 g | Freq: Three times a day (TID) | INTRAVENOUS | Status: DC
  Administered 2014-08-08 – 2014-08-09 (×3): 1 g via INTRAVENOUS
  Filled 2014-08-08 (×6): qty 1

## 2014-08-08 MED ORDER — HYDROXYUREA 500 MG PO CAPS: 500.0000 mg | ORAL_CAPSULE | Freq: Every day | ORAL | Status: DC

## 2014-08-08 MED ORDER — KETOROLAC TROMETHAMINE 30 MG/ML IJ SOLN
30.0000 mg | Freq: Once | INTRAMUSCULAR | Status: AC
  Administered 2014-08-08: 30 mg via INTRAVENOUS
  Filled 2014-08-08: qty 1

## 2014-08-08 MED ORDER — MORPHINE SULFATE 4 MG/ML IJ SOLN
6.0000 mg | Freq: Once | INTRAMUSCULAR | Status: AC
  Administered 2014-08-08: 6 mg via INTRAVENOUS
  Filled 2014-08-08: qty 2

## 2014-08-08 MED ORDER — DEXTROSE-NACL 5-0.45 % IV SOLN
INTRAVENOUS | Status: DC
  Administered 2014-08-08: 100 mL/h via INTRAVENOUS
  Administered 2014-08-08 – 2014-08-09 (×3): via INTRAVENOUS
  Administered 2014-08-09: 1000 mL via INTRAVENOUS
  Administered 2014-08-10 – 2014-08-12 (×6): via INTRAVENOUS

## 2014-08-08 MED ORDER — KETOROLAC TROMETHAMINE 15 MG/ML IJ SOLN
15.0000 mg | Freq: Four times a day (QID) | INTRAMUSCULAR | Status: DC
  Administered 2014-08-08: 15 mg via INTRAVENOUS
  Filled 2014-08-08: qty 1

## 2014-08-08 MED ORDER — ONDANSETRON HCL 4 MG/2ML IJ SOLN: 4.0000 mg | Freq: Four times a day (QID) | INTRAMUSCULAR | Status: DC | PRN

## 2014-08-08 MED ORDER — SODIUM CHLORIDE 0.9 % IV BOLUS (SEPSIS)
2000.0000 mL | Freq: Once | INTRAVENOUS | Status: AC
  Administered 2014-08-08: 2000 mL via INTRAVENOUS

## 2014-08-08 MED ORDER — FOLIC ACID 1 MG PO TABS
1.0000 mg | ORAL_TABLET | Freq: Every day | ORAL | Status: DC
  Administered 2014-08-08 – 2014-08-14 (×7): 1 mg via ORAL
  Filled 2014-08-08 (×7): qty 1

## 2014-08-08 MED ORDER — KETOROLAC TROMETHAMINE 15 MG/ML IJ SOLN
30.0000 mg | Freq: Four times a day (QID) | INTRAMUSCULAR | Status: AC
  Administered 2014-08-08 – 2014-08-13 (×17): 30 mg via INTRAVENOUS
  Filled 2014-08-08 (×19): qty 2

## 2014-08-08 MED ORDER — HEPARIN SODIUM (PORCINE) 5000 UNIT/ML IJ SOLN
5000.0000 [IU] | Freq: Three times a day (TID) | INTRAMUSCULAR | Status: DC
  Filled 2014-08-08 (×4): qty 1

## 2014-08-08 MED ORDER — MORPHINE SULFATE (PF) 1 MG/ML IV SOLN
INTRAVENOUS | Status: DC
Start: 2014-08-08 — End: 2014-08-14
  Administered 2014-08-08: 18:00:00 via INTRAVENOUS
  Administered 2014-08-08 (×2): 3 mg via INTRAVENOUS
  Administered 2014-08-08: 9 mg via INTRAVENOUS
  Administered 2014-08-08: 15 mg via INTRAVENOUS
  Administered 2014-08-09: 4.5 mg via INTRAVENOUS
  Administered 2014-08-09: 9.86 mg via INTRAVENOUS
  Administered 2014-08-09: 7.5 mg via INTRAVENOUS
  Administered 2014-08-09: 16.5 mg via INTRAVENOUS
  Administered 2014-08-09: 6 mg via INTRAVENOUS
  Administered 2014-08-09 – 2014-08-10 (×2): via INTRAVENOUS
  Administered 2014-08-10: 10.5 mg via INTRAVENOUS
  Administered 2014-08-10: 4.5 mg via INTRAVENOUS
  Administered 2014-08-10: 06:00:00 via INTRAVENOUS
  Administered 2014-08-10: 0 mg via INTRAVENOUS
  Administered 2014-08-10: 7.5 mg via INTRAVENOUS
  Administered 2014-08-11: 6 mg via INTRAVENOUS
  Administered 2014-08-11: 4.5 mg via INTRAVENOUS
  Administered 2014-08-11: 13.5 mg via INTRAVENOUS
  Administered 2014-08-11: 12 mg via INTRAVENOUS
  Administered 2014-08-11: 7.5 mg via INTRAVENOUS
  Administered 2014-08-11: 7.41 mg via INTRAVENOUS
  Administered 2014-08-11: 15 mg via INTRAVENOUS
  Administered 2014-08-11: 22:00:00 via INTRAVENOUS
  Administered 2014-08-11: 5.51 mg via INTRAVENOUS
  Administered 2014-08-11: 16.5 mg via INTRAVENOUS
  Administered 2014-08-12: 7 mg via INTRAVENOUS
  Administered 2014-08-12: 18:00:00 via INTRAVENOUS
  Administered 2014-08-12: 16.5 mg via INTRAVENOUS
  Administered 2014-08-12: 14.9 mg via INTRAVENOUS
  Administered 2014-08-12 (×2): 6 mg via INTRAVENOUS
  Administered 2014-08-13: 3 mg via INTRAVENOUS
  Administered 2014-08-13: 0 mg via INTRAVENOUS
  Administered 2014-08-13: 23:00:00 via INTRAVENOUS
  Administered 2014-08-13: 0 mg via INTRAVENOUS
  Administered 2014-08-13: 1.5 mg via INTRAVENOUS
  Administered 2014-08-13: 3 mg via INTRAVENOUS
  Administered 2014-08-13 – 2014-08-14 (×2): 7.5 mg via INTRAVENOUS
  Administered 2014-08-14: 2.56 mg via INTRAVENOUS
  Administered 2014-08-14: 20.81 mg via INTRAVENOUS
  Filled 2014-08-08 (×11): qty 25

## 2014-08-08 MED ORDER — POLYETHYLENE GLYCOL 3350 17 G PO PACK
17.0000 g | PACK | Freq: Every day | ORAL | Status: DC | PRN
  Filled 2014-08-08: qty 1

## 2014-08-08 MED ORDER — FOLIC ACID 1 MG PO TABS: 1.0000 mg | ORAL_TABLET | Freq: Every day | ORAL | Status: DC

## 2014-08-08 MED ORDER — HEPARIN SODIUM (PORCINE) 5000 UNIT/ML IJ SOLN
5000.0000 [IU] | Freq: Three times a day (TID) | INTRAMUSCULAR | Status: DC
  Administered 2014-08-08 – 2014-08-14 (×17): 5000 [IU] via SUBCUTANEOUS
  Filled 2014-08-08 (×22): qty 1

## 2014-08-08 MED ORDER — SODIUM CHLORIDE 0.9 % IV SOLN
Freq: Once | INTRAVENOUS | Status: AC
  Administered 2014-08-08: 15:00:00 via INTRAVENOUS

## 2014-08-08 MED ORDER — MORPHINE SULFATE 5 MG/ML IV PCA SOLN
INTRAVENOUS | Status: DC
  Administered 2014-08-08: 05:00:00 via INTRAVENOUS
  Administered 2014-08-08: 4 mg via INTRAVENOUS

## 2014-08-08 MED ORDER — DIPHENHYDRAMINE HCL 25 MG PO CAPS
25.0000 mg | ORAL_CAPSULE | ORAL | Status: DC | PRN
  Administered 2014-08-08: 50 mg via ORAL
  Filled 2014-08-08: qty 2

## 2014-08-08 MED ORDER — CITALOPRAM HYDROBROMIDE 40 MG PO TABS
40.0000 mg | ORAL_TABLET | Freq: Every day | ORAL | Status: DC
  Administered 2014-08-09 – 2014-08-14 (×6): 40 mg via ORAL
  Filled 2014-08-08 (×8): qty 1

## 2014-08-08 NOTE — Plan of Care (Signed)
Problem: Consults Goal: Sickle Cell Crisis Patient Education Outcome: Progressing

## 2014-08-08 NOTE — ED Notes (Addendum)
[facility] presents with a 18 yo male from home with lower back pain.  Seen on 20th and 21st of November for same and was given Tramadol and Percocet on respective days for lower back pain.  Pt states Tramadol was not helping and he needs refills on Percocet.  Pt has hx of sickle cell and pt states that the back pain could be related.

## 2014-08-08 NOTE — Progress Notes (Signed)
SICKLE CELL SERVICE PROGRESS NOTE  Jeffrey Morrison IOE:[id number] DOB: August 24, 1996 DOA: 08/08/2014 PCP: No PCP Per Patient  Assessment/Plan: Active Problems:   Sickle cell crisis   Acute chest syndrome in sickle crisis   HCAP (healthcare-associated pneumonia)   Acute respiratory failure with hypoxia  1. Acute Chest Syndrome: Pt presented with new infiltrate, Hypoxemia and chest wall pain. This constitutes Acute chest likely secondary to Pneumonia and sequela of Sickle Cell in light of his most recent electrophoresis which showed Hb S% of 84 and no recent transfusions. Also he currently has significantly low Hb which is contributing to decreased Oxygen carrying capacity. I will continue current antibiotics as he has been incarcerated for the last 4 months. Also transfuse 2 units PRBC to decrease percentage of Hb S and increase Oxygen carrying capacity.  2. Hb SS with crisis: Pt is now opiate naive as he has been without any narcotics for the last 4 months. He has used only 4 mg on the PCA I will change PCA to full dose PCA and increase Toradol to 30 mg as current dose is only .19 mg/kg and increase to 30 mg will give a dose of .38 mg/kg. 3. Anemia: Etiology unclear as he does not show evidence of significant hemolysis. I will check serum Iron to ensure he does not have a concurrent Iron deficiency. Also check stool for guaiac.  4. Pneumonia: Pt has findings of new infiltrate but no cough or fever. I will continue to observe. However I am moving in favor of discontinuing antibiotics as this may all be secondary to atelectasis and acute chest syndrome. 5. Sickle Cell Disease: Hydrea is listed as a home medication however it is unclear if he has been taking this medication during his incarceration. Nevertheless, his indices do no permit Hydrea at present. Also he currently has no PCP and Hydrea should not be started unless it can be closely monitored.  Code Status: Full Code Family Communication:  N/A Disposition Plan: Not yet ready for discharge  [place].  Pager [phone]. If 7PM-7AM, please contact night-coverage.  08/08/2014, 9:35 AM  LOS: 0 days   Admitting History: History of Present Illness:This is a 18 y.o. year old male with significant past medical history of sickle cell disease, tobacco abuse presenting with HCAP, sickle cell syndrome w/ acute chest. Pt states that he has had severe upper back pain over past 4-5 days. States that sickle cell pain is usually in legs. States that he was recently released from prison approx 3-4 days ago. States that sxs started prior to prison release. Was in prison over past 7 months for breaking and entering. States that he has had 2 sickle cell flares while in prison. Most recent was 1-2 weeks ago. Also with SOB. Denies any fevers or chills. Was seen in ER 3 days ago for similar sxs. CXR showed minimal bibasilar atelectasis. Was given percocet for pain. Was minimally effective in addressing pain. Unclear of hgb baseline.  Presented to ER t max 100.6, HR 100s, resp 20s, BP 120s, Satting low 90s on RA. Hgb 7.7-->5.4, WBC pending, cr 0.94. Retic count >23. Lactate 0.82. CXR Left base opacity which may reflect changes of acute chest or pneumonia. Initially started on IV levaquin. Pending 2 unit PRBC transfusion.   Consultants:  None  Procedures:  None  Antibiotics:  Cefepime 11/23>>  Vancomycin 11/23>>  HPI/Subjective: Pt states that he feels better. However it is difficult to gauge the extent of his current symptoms as he  states that he feels fine at the same time reporting that he has pain in his back.  Objective: Filed Vitals:   08/08/14 0600 08/08/14 0700 08/08/14 0800 08/08/14 0900  BP: 101/39 93/48 101/43 104/44  Pulse: 86 79 77 72  Temp:   98.6 F (37 C)   TempSrc:   Oral   Resp: 26 20 19 19   Height:      Weight:      SpO2: 100% 100% 100% 100%   Weight change:   Intake/Output Summary (Last 24 hours) at  08/08/14 0935 Last data filed at 08/08/14 0800  Gross per 24 hour  Intake    555 ml  Output      0 ml  Net    555 ml    General: Alert, awake, oriented x3, in no acute distress.  HEENT: [place]/AT PEERL, EOMI, anicteric. Conjunctiva pale Neck: Trachea midline,  no masses, no thyromegal,y no JVD, no carotid bruit OROPHARYNX:  Moist, No exudate/ erythema/lesions.  Heart: Regular rate and rhythm, without murmurs, rubs, gallops, PMI non-displaced, no heaves or thrills on palpation.  Lungs: Clear to auscultation, no wheezing or rhonchi noted. No increased vocal fremitus resonant to percussion  Abdomen: Soft, nontender, nondistended, positive bowel sounds, no masses no hepatosplenomegaly noted..  Neuro: No focal neurological deficits noted cranial nerves II through XII grossly intact. DTRs 2+ bilaterally upper and lower extremities. Strength functional in bilateral upper and lower extremities. Musculoskeletal: No warm swelling or erythema around joints, no spinal tenderness noted. Psychiatric: Patient alert and oriented x 3, good recent to remote recall. Lymph node survey: No cervical axillary or inguinal lymphadenopathy noted.   Data Reviewed: Basic Metabolic Panel:  Recent Labs Lab 08/04/14 2257 08/05/14 1613 08/08/14 0203 08/08/14 0328 08/08/14 0500  NA 141 141 138  --  137  K 4.5 4.7 4.4  --  4.3  CL 105 109 102  --  104  CO2 24 20 24   --  23  GLUCOSE 110* 132* 118*  --  118*  BUN 17 14 17   --  18  CREATININE 0.98 0.95 1.01 1.05 1.00  CALCIUM 9.2 9.0 9.4  --  8.6  MG  --   --   --  2.2  --    Liver Function Tests:  Recent Labs Lab 08/05/14 1613 08/08/14 0500  AST 47* 32  ALT 23 26  ALKPHOS 97 107  BILITOT 2.7* 2.2*  PROT 7.3 6.6  ALBUMIN 4.0 3.0*    Recent Labs Lab 08/05/14 1613  LIPASE 20   No results for input(s): AMMONIA in the last 168 hours. CBC:  Recent Labs Lab 08/04/14 2257 08/05/14 1613 08/08/14 0203 08/08/14 0500  WBC 16.9* 21.0* 18.4* 16.4*   NEUTROABS 12.0* 17.4* 14.0* 11.6*  HGB 7.7* 7.7* 5.4* 4.5*  HCT 22.5* 21.6* 15.9* 12.8*  MCV 93.4 91.1 91.4 89.5  PLT 337 296 359 300   Cardiac Enzymes: No results for input(s): CKTOTAL, CKMB, CKMBINDEX, TROPONINI in the last 168 hours. BNP (last 3 results) No results for input(s): PROBNP in the last 8760 hours. CBG: No results for input(s): GLUCAP in the last 168 hours.  Recent Results (from the past 240 hour(s))  MRSA PCR Screening     Status: None   Collection Time: 08/08/14  4:07 AM  Result Value Ref Range Status   MRSA by PCR NEGATIVE NEGATIVE Final    Comment:        The GeneXpert MRSA Assay (FDA approved for NASAL specimens only),  is one component of a comprehensive MRSA colonization surveillance program. It is not intended to diagnose MRSA infection nor to guide or monitor treatment for MRSA infections.      Studies: Dg Chest 2 View  08/08/2014   CLINICAL DATA:  Sickle cell crisis.  Hypoxia and weakness.  EXAM: CHEST  2 VIEW  COMPARISON:  08/05/2014  FINDINGS: Mild cardiac enlargement. No pleural effusion or edema. Left posterior lung base opacity is identified and may reflect changes of acute chest. Atelectasis versus scar is noted in the right base. Skeletal changes consistent with sickle cell disease noted.  IMPRESSION: 1. Left base opacity which may reflect changes of acute chest or pneumonia.   Electronically Signed   By: Kerby Moors M.D.   On: 08/08/2014 02:29   Dg Chest 2 View  08/05/2014   CLINICAL DATA:  18 year old male with left-sided chest pain and shortness of breath. Sickle cell disease.  EXAM: CHEST  2 VIEW  COMPARISON:  01/09/2014 and prior chest radiographs dating back to 09/08/2006  FINDINGS: Upper limits normal heart size noted with mildly increased vascularity.  Minimal basilar atelectasis noted in this mildly low volume film.  There is no evidence of focal airspace disease, pulmonary edema, suspicious pulmonary nodule/mass, pleural effusion, or  pneumothorax. No acute bony abnormalities are identified.  IMPRESSION: Minimal bibasilar atelectasis without other evidence of acute abnormality.   Electronically Signed   By: Hassan Rowan M.D.   On: 08/05/2014 16:48    Scheduled Meds: . sodium chloride   Intravenous Once  . ceFEPime (MAXIPIME) IV  1 g Intravenous 3 times per day  . folic acid  1 mg Oral Daily  . heparin  5,000 Units Subcutaneous 3 times per day  . ketorolac  30 mg Intravenous 4 times per day  . morphine   Intravenous 6 times per day  . nicotine  14 mg Transdermal Daily  . senna-docusate  1 tablet Oral BID  . vancomycin  1,000 mg Intravenous Q8H   Continuous Infusions: . dextrose 5 % and 0.45% NaCl 100 mL/hr at 08/08/14 0626    Total time spent 45 minutes.

## 2014-08-08 NOTE — Progress Notes (Signed)
Patient was stable at transfer. Report given to [facility].

## 2014-08-08 NOTE — H&P (Signed)
Hospitalist Admission History and Physical  Patient name: Jeffrey Morrison Medical record number: [id number] Date of birth: 04-20-1996 Age: 18 y.o. Gender: male  Primary Care Provider: No PCP Per Patient  Chief Complaint: HCAP, sickle cell crisis   History of Present Illness:This is a 18 y.o. year old male with significant past medical history of sickle cell disease, tobacco abuse  presenting with HCAP, sickle cell syndrome w/ acute chest. Pt states that he has had severe upper back pain over past 4-5 days. States that sickle cell pain is usually in legs. States that he was recently released from prison approx 3-4 days ago. States that sxs started prior to prison release. Was in prison over past 7 months for breaking and entering. States that he has had 2 sickle cell flares while in prison. Most recent was 1-2 weeks ago. Also with SOB. Denies any fevers or chills. Was seen in ER 3 days ago for similar sxs. CXR showed minimal bibasilar atelectasis. Was given percocet for pain. Was minimally effective in addressing pain. Unclear of hgb baseline.  Presented to ER t max 100.6, HR 100s, resp 20s, BP 120s, Satting low 90s on RA. Hgb 7.7-->5.4, WBC pending, cr 0.94. Retic count >23. Lactate 0.82. CXR Left base opacity which may reflect changes of acute chest or pneumonia. Initially started on IV levaquin. Pending 2 unit PRBC transfusion.    Assessment and Plan: Jeffrey Morrison is a 18 y.o. year old male presenting with sickle cell crisis w/ ? Acute chest, HCAP    Active Problems:   Sickle cell crisis   Acute chest syndrome in sickle crisis   HCAP (healthcare-associated pneumonia)   Acute respiratory failure with hypoxia   1- Sickle cell crisis w/ ? Acute chest syndrome -2 hgb drop since 11/21  -pRBC transfusion  -pain control-morphine PCA  -scheduled toradol  -vanc and cefepime for HCAP coverage-should give adequate acute chest coverage.  -serial retic  -supplemental O2  2- HCAP  -place  on HCAP tx given recent prison release as well as recent hospitalization while in prison -vanc and cefepime  -blood, sputum cx -urine strep and legionella  -HIV  -supplemental O2 -tobacco abuse counseling   3- sickle cell  -cont hydroxyurea  -treat as above    FEN/GI: heart healthy diet  Prophylaxis: sub q heparin  Disposition: pending further evaluation  Code Status:Full Code    Patient Active Problem List   Diagnosis Date Noted  . Acute chest syndrome in sickle crisis 08/08/2014  . Sickle cell pain crisis 01/08/2014  . Sickle cell crisis 01/08/2014  . Leukocytosis, unspecified 01/08/2014  . Anemia 01/08/2014   Past Medical History: Past Medical History  Diagnosis Date  . Sickle cell crisis   . Chronic lower back pain     Past Surgical History: Past Surgical History  Procedure Laterality Date  . Splenectomy      Social History: History   Social History  . Marital Status: Single    Spouse Name: N/A    Number of Children: N/A  . Years of Education: N/A   Social History Main Topics  . Smoking status: Current Every Day Smoker -- 0.50 packs/day    Types: Cigarettes  . Smokeless tobacco: None  . Alcohol Use: No  . Drug Use: No  . Sexual Activity: None   Other Topics Concern  . None   Social History Narrative    Family History: History reviewed. No pertinent family history.  Allergies: No Known Allergies  Current  Facility-Administered Medications  Medication Dose Route Frequency Provider Last Rate Last Dose  . ceFEPIme (MAXIPIME) 1 g in dextrose 5 % 50 mL IVPB  1 g Intravenous 3 times per day Shanda Howells, MD      . dextrose 5 %-0.45 % sodium chloride infusion   Intravenous Continuous Shanda Howells, MD      . diphenhydrAMINE (BENADRYL) capsule 25-50 mg  25-50 mg Oral Q4H PRN Shanda Howells, MD       Or  . diphenhydrAMINE (BENADRYL) 25 mg in sodium chloride 0.9 % 50 mL IVPB  25 mg Intravenous Q4H PRN Shanda Howells, MD      . folic acid (FOLVITE)  tablet 1 mg  1 mg Oral Daily Shanda Howells, MD      . heparin injection 5,000 Units  5,000 Units Subcutaneous 3 times per day Shanda Howells, MD      . ketorolac (TORADOL) 15 MG/ML injection 15 mg  15 mg Intravenous 4 times per day Shanda Howells, MD      . levofloxacin (LEVAQUIN) IVPB 750 mg  750 mg Intravenous Once Everlene Balls, MD 100 mL/hr at 08/08/14 0303 750 mg at 08/08/14 0303  . morphine 5 mg/mL High Concentration PCA injection   Intravenous 6 times per day Shanda Howells, MD      . naloxone [facility]) injection 0.4 mg  0.4 mg Intravenous PRN Shanda Howells, MD       And  . sodium chloride 0.9 % injection 9 mL  9 mL Intravenous PRN Shanda Howells, MD      . nicotine (NICODERM CQ - dosed in mg/24 hours) patch 14 mg  14 mg Transdermal Daily Shanda Howells, MD      . ondansetron [facility]) injection 4 mg  4 mg Intravenous Q6H PRN Shanda Howells, MD      . polyethylene glycol (MIRALAX / GLYCOLAX) packet 17 g  17 g Oral Daily PRN Shanda Howells, MD      . senna-docusate (Senokot-S) tablet 1 tablet  1 tablet Oral BID Shanda Howells, MD      . vancomycin (VANCOCIN) IVPB 1000 mg/200 mL premix  1,000 mg Intravenous Q8H Dorrene German, [facility]       Current Outpatient Prescriptions  Medication Sig Dispense Refill  . folic acid (FOLVITE) 1 MG tablet Take 1 tablet (1 mg total) by mouth daily. 30 tablet 0  . hydroxyurea (HYDREA) 500 MG capsule Take 1 capsule (500 mg total) by mouth daily. May take with food to minimize GI side effects. 30 capsule 0  . oxyCODONE-acetaminophen (PERCOCET) 5-325 MG per tablet Take 1 tablet by mouth every 6 (six) hours as needed. 10 tablet 0  . penicillin v potassium (VEETID) 500 MG tablet Take 1 tablet (500 mg total) by mouth 3 (three) times daily. 30 tablet 0  . traMADol (ULTRAM) 50 MG tablet Take 1 tablet (50 mg total) by mouth every 6 (six) hours as needed. (Patient taking differently: Take 50 mg by mouth every 6 (six) hours as needed for moderate pain. ) 15 tablet 0  . citalopram  (CELEXA) 40 MG tablet Take 40 mg by mouth daily.    . hydrOXYzine (ATARAX/VISTARIL) 50 MG tablet Take 50 mg by mouth 3 (three) times daily as needed.    Marland Kitchen ibuprofen (ADVIL,MOTRIN) 200 MG tablet Take 200-400 mg by mouth every 6 (six) hours as needed for mild pain.     Marland Kitchen penicillin v potassium (VEETID) 250 MG tablet Take 250 mg by mouth 2 (two) times daily.  Review Of Systems: 12 point ROS negative except as noted above in HPI.  Physical Exam: Filed Vitals:   08/08/14 0221  BP: 123/57  Pulse: 103  Temp:   Resp: 23    General: alert and cooperative HEENT: PERRLA and extra ocular movement intact Heart: S1, S2 normal, no murmur, rub or gallop, regular rate and rhythm Lungs: clear to auscultation and mildly labored breathing  Abdomen: abdomen is soft without significant tenderness, masses, organomegaly or guarding Extremities: extremities normal, atraumatic, no cyanosis or edema Skin:no rashes, no ecchymoses Neurology: normal without focal findings  Labs and Imaging: Lab Results  Component Value Date/Time   NA 138 08/08/2014 02:03 AM   K 4.4 08/08/2014 02:03 AM   CL 102 08/08/2014 02:03 AM   CO2 24 08/08/2014 02:03 AM   BUN 17 08/08/2014 02:03 AM   CREATININE 1.01 08/08/2014 02:03 AM   GLUCOSE 118* 08/08/2014 02:03 AM   Lab Results  Component Value Date   WBC PENDING 08/08/2014   HGB 5.4* 08/08/2014   HCT 15.9* 08/08/2014   MCV 91.4 08/08/2014   PLT 359 08/08/2014    Dg Chest 2 View  08/08/2014   CLINICAL DATA:  Sickle cell crisis.  Hypoxia and weakness.  EXAM: CHEST  2 VIEW  COMPARISON:  08/05/2014  FINDINGS: Mild cardiac enlargement. No pleural effusion or edema. Left posterior lung base opacity is identified and may reflect changes of acute chest. Atelectasis versus scar is noted in the right base. Skeletal changes consistent with sickle cell disease noted.  IMPRESSION: 1. Left base opacity which may reflect changes of acute chest or pneumonia.   Electronically Signed    By: Kerby Moors M.D.   On: 08/08/2014 02:29           Shanda Howells MD  Pager: [phone]

## 2014-08-08 NOTE — Progress Notes (Signed)
ANTIBIOTIC CONSULT NOTE - INITIAL  Pharmacy Consult for Cefepime/Vancomycin Indication: HCAP  No Known Allergies  Patient Measurements:   Wt=80 kg  Vital Signs: Temp: 100.6 F (38.1 C) (11/24 0137) Temp Source: Oral (11/24 0137) BP: 123/57 mmHg (11/24 0221) Pulse Rate: 103 (11/24 0221) Intake/Output from previous day:   Intake/Output from this shift:    Labs:  Recent Labs  08/05/14 1613 08/08/14 0203  WBC 21.0* 18.4*  HGB 7.7* 5.4*  PLT 296 359  CREATININE 0.95 1.01   CrCl cannot be calculated (Unknown ideal weight.). No results for input(s): VANCOTROUGH, VANCOPEAK, VANCORANDOM, GENTTROUGH, GENTPEAK, GENTRANDOM, TOBRATROUGH, TOBRAPEAK, TOBRARND, AMIKACINPEAK, AMIKACINTROU, AMIKACIN in the last 72 hours.   Microbiology: No results found for this or any previous visit (from the past 720 hour(s)).  Medical History: Past Medical History  Diagnosis Date  . Sickle cell crisis   . Chronic lower back pain     Medications:  Scheduled:  . ceFEPime (MAXIPIME) IV  1 g Intravenous 3 times per day  . folic acid  1 mg Oral Daily  . heparin  5,000 Units Subcutaneous 3 times per day  . ketorolac  15 mg Intravenous 4 times per day  . morphine   Intravenous 6 times per day  . nicotine  14 mg Transdermal Daily  . senna-docusate  1 tablet Oral BID  . vancomycin  1,000 mg Intravenous Q8H   Infusions:  . dextrose 5 % and 0.45% NaCl 100 mL/hr (08/08/14 0427)   Assessment: 47 yoM admitted with HCAP.  Cefepime and Vancomycin per Rx.   Goal of Therapy:  Vancomycin trough level 15-20 mcg/ml  Plan:   Cefepime 1Gm IV q8h  Vancomycin 1Gm IV q8h  F/U SCr/levels/cultures as needed  Lawana Pai R 08/08/2014,5:27 AM

## 2014-08-08 NOTE — ED Provider Notes (Signed)
CSN: [id number]     Arrival date & time 08/08/14  0126 History   First MD Initiated Contact with Patient 08/08/14 0154     Chief Complaint  Patient presents with  . Back Pain     (Consider location/radiation/quality/duration/timing/severity/associated sxs/prior Treatment) HPI  Jeffrey Morrison is a 18 y.o. male with past medical history of sickle cell anemia status post splenectomy coming in with back and bilateral side pain.  Patient states this has occurred for the past 3 days. He was treating it at home which aren't all and Percocet. Percocet provided minimal relief, terminal did not work. Patient states he normally has is pain in his legs and having the pain in his back and sides is abnormal for him. Patient has had subjective fever during the interval. He admits to coughing however this has been nonproductive. He denies any history of acute chest syndrome in the past. Patient also feels short of breath, and is difficult to take deep breaths. He's denying any chest pain. He has no further complaints.   10 Systems reviewed and are negative for acute change except as noted in the HPI.    Past Medical History  Diagnosis Date  . Sickle cell crisis   . Chronic lower back pain    Past Surgical History  Procedure Laterality Date  . Splenectomy     History reviewed. No pertinent family history. History  Substance Use Topics  . Smoking status: Current Every Day Smoker -- 0.50 packs/day    Types: Cigarettes  . Smokeless tobacco: Not on file  . Alcohol Use: No    Review of Systems    Allergies  Review of patient's allergies indicates no known allergies.  Home Medications   Prior to Admission medications   Medication Sig Start Date End Date Taking? Authorizing Provider  folic acid (FOLVITE) 1 MG tablet Take 1 tablet (1 mg total) by mouth daily. 03/21/14  Yes Nicole Pisciotta, PA-C  hydroxyurea (HYDREA) 500 MG capsule Take 1 capsule (500 mg total) by mouth daily. May take with food  to minimize GI side effects. 03/21/14  Yes Nicole Pisciotta, PA-C  oxyCODONE-acetaminophen (PERCOCET) 5-325 MG per tablet Take 1 tablet by mouth every 6 (six) hours as needed. 08/05/14  Yes Harvie Heck, PA-C  penicillin v potassium (VEETID) 500 MG tablet Take 1 tablet (500 mg total) by mouth 3 (three) times daily. 03/21/14  Yes Nicole Pisciotta, PA-C  traMADol (ULTRAM) 50 MG tablet Take 1 tablet (50 mg total) by mouth every 6 (six) hours as needed. Patient taking differently: Take 50 mg by mouth every 6 (six) hours as needed for moderate pain.  08/05/14  Yes Noland Fordyce, PA-C  citalopram (CELEXA) 40 MG tablet Take 40 mg by mouth daily.    Historical Provider, MD  hydrOXYzine (ATARAX/VISTARIL) 50 MG tablet Take 50 mg by mouth 3 (three) times daily as needed.    Historical Provider, MD  ibuprofen (ADVIL,MOTRIN) 200 MG tablet Take 200-400 mg by mouth every 6 (six) hours as needed for mild pain.     Historical Provider, MD  penicillin v potassium (VEETID) 250 MG tablet Take 250 mg by mouth 2 (two) times daily.    Historical Provider, MD   BP 123/57 mmHg  Pulse 103  Temp(Src) 100.6 F (38.1 C) (Oral)  Resp 23  SpO2 100% Physical Exam  Constitutional: He is oriented to person, place, and time. Vital signs are normal. He appears well-developed and well-nourished.  Non-toxic appearance. He does not appear ill.  He appears distressed.  Distress due to pain  HENT:  Head: Normocephalic and atraumatic.  Nose: Nose normal.  Mouth/Throat: Oropharynx is clear and moist. No oropharyngeal exudate.  Eyes: Conjunctivae and EOM are normal. Pupils are equal, round, and reactive to light. No scleral icterus.  Neck: Normal range of motion. Neck supple. No tracheal deviation, no edema, no erythema and normal range of motion present. No thyroid mass and no thyromegaly present.  Cardiovascular: Regular rhythm, S1 normal, S2 normal, normal heart sounds, intact distal pulses and normal pulses.  Exam reveals no gallop  and no friction rub.   No murmur heard. Pulses:      Radial pulses are 2+ on the right side, and 2+ on the left side.       Dorsalis pedis pulses are 2+ on the right side, and 2+ on the left side.  Tachycardic  Pulmonary/Chest: Breath sounds normal. He is in respiratory distress. He has no wheezes. He has no rhonchi. He has no rales. He exhibits tenderness.  Tachypnea and increased work of breathing noted.  Abdominal: Soft. Normal appearance and bowel sounds are normal. He exhibits no distension, no ascites and no mass. There is no hepatosplenomegaly. There is no tenderness. There is no rebound, no guarding and no CVA tenderness.  Musculoskeletal: Normal range of motion. He exhibits no edema or tenderness.  Lymphadenopathy:    He has no cervical adenopathy.  Neurological: He is alert and oriented to person, place, and time. He has normal strength. No cranial nerve deficit or sensory deficit. He exhibits normal muscle tone. GCS eye subscore is 4. GCS verbal subscore is 5. GCS motor subscore is 6.  Skin: Skin is warm, dry and intact. No petechiae and no rash noted. He is not diaphoretic. No erythema. No pallor.  Psychiatric: He has a normal mood and affect. His behavior is normal. Judgment normal.  Nursing note and vitals reviewed.   ED Course  Procedures (including critical care time) Labs Review Labs Reviewed  CBC WITH DIFFERENTIAL - Abnormal; Notable for the following:    WBC 18.4 (*)    RBC 1.74 (*)    Hemoglobin 5.4 (*)    HCT 15.9 (*)    RDW 23.3 (*)    Lymphocytes Relative 10 (*)    Monocytes Relative 14 (*)    Neutro Abs 14.0 (*)    Monocytes Absolute 2.6 (*)    All other components within normal limits  BASIC METABOLIC PANEL - Abnormal; Notable for the following:    Glucose, Bld 118 (*)    All other components within normal limits  RETICULOCYTES - Abnormal; Notable for the following:    Retic Ct Pct >23.0 (*)    RBC. 1.74 (*)    All other components within normal limits   BLOOD GAS, VENOUS - Abnormal; Notable for the following:    pH, Ven 7.413 (*)    pCO2, Ven 38.0 (*)    All other components within normal limits  COMPREHENSIVE METABOLIC PANEL - Abnormal; Notable for the following:    Glucose, Bld 118 (*)    Albumin 3.0 (*)    Total Bilirubin 2.2 (*)    All other components within normal limits  LACTATE DEHYDROGENASE - Abnormal; Notable for the following:    LDH 736 (*)    All other components within normal limits  CBC WITH DIFFERENTIAL - Abnormal; Notable for the following:    WBC 16.4 (*)    RBC 1.43 (*)    Hemoglobin 4.5 (*)  HCT 12.8 (*)    RDW 22.1 (*)    Monocytes Relative 14 (*)    nRBC 23 (*)    Neutro Abs 11.6 (*)    Monocytes Absolute 2.3 (*)    All other components within normal limits  RETICULOCYTES - Abnormal; Notable for the following:    Retic Ct Pct 20.0 (*)    RBC. 1.43 (*)    Retic Count, Manual 286.0 (*)    All other components within normal limits  MRSA PCR SCREENING  CULTURE, BLOOD (ROUTINE X 2)  CULTURE, BLOOD (ROUTINE X 2)  CULTURE, EXPECTORATED SPUTUM-ASSESSMENT  GRAM STAIN  CREATININE, SERUM  MAGNESIUM  HIV ANTIBODY (ROUTINE TESTING)  LEGIONELLA ANTIGEN, URINE  STREP PNEUMONIAE URINARY ANTIGEN  I-STAT CG4 LACTIC ACID, ED  TYPE AND SCREEN  PREPARE RBC (CROSSMATCH)  PREPARE RBC (CROSSMATCH)    Imaging Review Dg Chest 2 View  08/08/2014   CLINICAL DATA:  Sickle cell crisis.  Hypoxia and weakness.  EXAM: CHEST  2 VIEW  COMPARISON:  08/05/2014  FINDINGS: Mild cardiac enlargement. No pleural effusion or edema. Left posterior lung base opacity is identified and may reflect changes of acute chest. Atelectasis versus scar is noted in the right base. Skeletal changes consistent with sickle cell disease noted.  IMPRESSION: 1. Left base opacity which may reflect changes of acute chest or pneumonia.   Electronically Signed   By: Kerby Moors M.D.   On: 08/08/2014 02:29     EKG Interpretation None      MDM    Final diagnoses:  Hypoxia    Patient presents emergency department for back and side pain. Chest x-ray reveals a left base pneumonia concerning for acute chest syndrome. Patient is febrile to 38.1, hypoxic to 90% on room air. Hemoglobin is 5.4. Reticulocyte count is greater than 23%.  Patient was aggressively managed with transfusion of blood 2 units, 2 L IV fluid, he was given morphine and Toradol for pain control. Patient's oxygen is 100% on 2 L nasal cannula. He'll be admitted to the hospitalist service for continued management, step down unit.  CRITICAL CARE Performed by: Everlene Balls   Total critical care time: 76min.  Critical care time was exclusive of separately billable procedures and treating other patients.  Critical care was necessary to treat or prevent imminent or life-threatening deterioration.  Critical care was time spent personally by me on the following activities: development of treatment plan with patient and/or surrogate as well as nursing, discussions with consultants, evaluation of patient's response to treatment, examination of patient, obtaining history from patient or surrogate, ordering and performing treatments and interventions, ordering and review of laboratory studies, ordering and review of radiographic studies, pulse oximetry and re-evaluation of patient's condition.     Everlene Balls, MD 08/08/14 [id number]

## 2014-08-08 NOTE — Progress Notes (Signed)
Hydroxyurea (Hydrea) hold criteria:  ANC < 2  Pltc < 80K in sickle-cell patients; < 100K in other patients  Hgb <= 6 in sickle-cell patients; < 8 in other patients  Reticulocytes < 80K when Hgb < 9  This pt meets hold criteria for Hydrea with a Hgb=5.4. Rx will d/c off MAR and the medication will have to be re-entered when pt meets criteria for no hold.    Tx Dorrene German 08/08/2014 4:56 AM

## 2014-08-09 LAB — TYPE AND SCREEN
ABO/RH(D): A POS
Antibody Screen: NEGATIVE
Unit division: 0
Unit division: 0
Unit division: 0
Unit division: 0

## 2014-08-09 LAB — CBC WITH DIFFERENTIAL/PLATELET
Basophils Absolute: 0 10*3/uL (ref 0.0–0.1)
Basophils Relative: 0 % (ref 0–1)
EOS PCT: 2 % (ref 0–5)
Eosinophils Absolute: 0.2 10*3/uL (ref 0.0–0.7)
HCT: 18.7 % — ABNORMAL LOW (ref 39.0–52.0)
Hemoglobin: 6.4 g/dL — CL (ref 13.0–17.0)
Lymphocytes Relative: 20 % (ref 12–46)
Lymphs Abs: 2.3 10*3/uL (ref 0.7–4.0)
MCH: 29.9 pg (ref 26.0–34.0)
MCHC: 34.2 g/dL (ref 30.0–36.0)
MCV: 87.4 fL (ref 78.0–100.0)
MONOS PCT: 14 % — AB (ref 3–12)
Monocytes Absolute: 1.6 10*3/uL — ABNORMAL HIGH (ref 0.1–1.0)
NEUTROS PCT: 64 % (ref 43–77)
Neutro Abs: 7.3 10*3/uL (ref 1.7–7.7)
Platelets: 361 10*3/uL (ref 150–400)
RBC: 2.14 MIL/uL — AB (ref 4.22–5.81)
RDW: 20 % — ABNORMAL HIGH (ref 11.5–15.5)
WBC: 11.4 10*3/uL — AB (ref 4.0–10.5)
nRBC: 18 /100 WBC — ABNORMAL HIGH

## 2014-08-09 LAB — EXPECTORATED SPUTUM ASSESSMENT W REFEX TO RESP CULTURE

## 2014-08-09 LAB — IRON AND TIBC
Iron: 30 ug/dL — ABNORMAL LOW (ref 42–135)
Saturation Ratios: 18 % — ABNORMAL LOW (ref 20–55)
TIBC: 166 ug/dL — ABNORMAL LOW (ref 215–435)
UIBC: 136 ug/dL (ref 125–400)

## 2014-08-09 LAB — EXPECTORATED SPUTUM ASSESSMENT W GRAM STAIN, RFLX TO RESP C: Special Requests: NORMAL

## 2014-08-09 LAB — LEGIONELLA ANTIGEN, URINE

## 2014-08-09 LAB — BASIC METABOLIC PANEL
Anion gap: 12 (ref 5–15)
BUN: 15 mg/dL (ref 6–23)
CALCIUM: 8.6 mg/dL (ref 8.4–10.5)
CHLORIDE: 102 meq/L (ref 96–112)
CO2: 24 mEq/L (ref 19–32)
CREATININE: 1.09 mg/dL (ref 0.50–1.35)
GFR calc non Af Amer: >90 mL/min (ref >90)
Glucose, Bld: 103 mg/dL — ABNORMAL HIGH (ref 70–99)
Potassium: 4.5 mEq/L (ref 3.7–5.3)
Sodium: 138 mEq/L (ref 137–147)

## 2014-08-09 LAB — FERRITIN: Ferritin: 1563 ng/mL — ABNORMAL HIGH (ref 22–322)

## 2014-08-09 LAB — VANCOMYCIN, TROUGH: Vancomycin Tr: 10.5 ug/mL (ref 10.0–20.0)

## 2014-08-09 MED ORDER — LEVOFLOXACIN 750 MG PO TABS
750.0000 mg | ORAL_TABLET | Freq: Every day | ORAL | Status: AC
  Administered 2014-08-09 – 2014-08-13 (×5): 750 mg via ORAL
  Filled 2014-08-09 (×5): qty 1

## 2014-08-09 NOTE — Plan of Care (Signed)
Problem: Consults Goal: Skin Care Protocol Initiated - if Braden Score 18 or less If consults are not indicated, leave blank or document N/A  Outcome: Not Applicable Date Met:  28/00/34

## 2014-08-09 NOTE — Plan of Care (Signed)
Problem: Phase I Progression Outcomes Goal: Pain controlled with appropriate interventions Outcome: Progressing Patient reports pain in lower back 7-8/10. Also with pain in chest with coughing/sneezing per pt. On PCA and toradol.

## 2014-08-09 NOTE — Progress Notes (Addendum)
SICKLE CELL SERVICE PROGRESS NOTE  Jeffrey Morrison XTK:[id number] DOB: Nov 21, 1995 DOA: 08/08/2014 PCP: No PCP Per Patient  Assessment/Plan: Active Problems:   Sickle cell crisis   Acute chest syndrome in sickle crisis   HCAP (healthcare-associated pneumonia)   Acute respiratory failure with hypoxia   Hypoxia   Anemia of chronic disease  1. Acute Chest Syndrome: Pt is s/p transfusion 2 units PRBC's and his Oxygen carrying capacity has improved. He still is not using his incentive spirometer much I have encouraged use and asked nursing to reinforce its use. Wean Oxygen to RA as able for saturations >96%.  (Pt presented with new infiltrate, Hypoxemia and chest wall pain. This constitutes Acute chest likely secondary to Pneumonia and sequela of Sickle Cell in light of his most recent electrophoresis which showed Hb S% of 84 and no recent transfusions.) 2. Hb SS with crisis: Pt is now opiate naive as he has been without any narcotics for the last 4 months. He has used 40.5 mg on the PCA  With 24/18: demands/deliveries. I will continue full dose PCA and  Toradol at 30 mg. Increase activity and consider transitioning to oral analgesics tomorrow. Pt does not currently have a PMD. We will see him in the clinic on Tuesday 08/15/2014 at 4:30 pm for a transition visit. 3. Anemia: Etiology unclear as he does not show evidence of significant hemolysis. I will check serum Iron to ensure he does not have a concurrent Iron deficiency. Also check stool for guaiac.  4. Pneumonia: Pt has findings of new infiltrate but no cough or fever. I change antibiotics to Levaquin by oral route (day5/8) 5. Sickle Cell Disease: Hydrea is listed as a home medication however it is unclear if he has been taking this medication during his incarceration. Also he currently has no PCP and Hydrea should not be started unless it can be closely monitored. We will check labs on Tuesday in the clinic and start Hydrea if indices permit.  Code  Status: Full Code Family Communication: N/A Disposition Plan: Not yet ready for discharge  [place].  Pager [phone]. If 7PM-7AM, please contact night-coverage.  08/09/2014, 4:30 PM  LOS: 1 day   Admitting History: History of Present Illness:This is a 18 y.o. year old male with significant past medical history of sickle cell disease, tobacco abuse presenting with HCAP, sickle cell syndrome w/ acute chest. Pt states that he has had severe upper back pain over past 4-5 days. States that sickle cell pain is usually in legs. States that he was recently released from prison approx 3-4 days ago. States that sxs started prior to prison release. Was in prison over past 7 months for breaking and entering. States that he has had 2 sickle cell flares while in prison. Most recent was 1-2 weeks ago. Also with SOB. Denies any fevers or chills. Was seen in ER 3 days ago for similar sxs. CXR showed minimal bibasilar atelectasis. Was given percocet for pain. Was minimally effective in addressing pain. Unclear of hgb baseline.  Presented to ER t max 100.6, HR 100s, resp 20s, BP 120s, Satting low 90s on RA. Hgb 7.7-->5.4, WBC pending, cr 0.94. Retic count >23. Lactate 0.82. CXR Left base opacity which may reflect changes of acute chest or pneumonia. Initially started on IV levaquin. Pending 2 unit PRBC transfusion.   Consultants:  None  Procedures:  None  Antibiotics:  Cefepime 11/23>>  Vancomycin 11/23>>  HPI/Subjective: Pt states that he feels better. However it is difficult  to gauge the extent of his current symptoms as he states that he feels fine at the same time reporting that he has pain in his back.  Objective: Filed Vitals:   08/09/14 0825 08/09/14 1021 08/09/14 1217 08/09/14 1350  BP:  104/55  109/59  Pulse:  85  64  Temp:  99 F (37.2 C)  99 F (37.2 C)  TempSrc:  Oral  Oral  Resp: 22 23 22 20   Height:      Weight:      SpO2: 97% 100% 99% 98%   Weight change:    Intake/Output Summary (Last 24 hours) at 08/09/14 1630 Last data filed at 08/09/14 0645  Gross per 24 hour  Intake 3603.68 ml  Output    825 ml  Net 2778.68 ml    General: Alert, awake, oriented x3, in no acute distress.  HEENT: [place]/AT PEERL, EOMI, anicteric. Conjunctiva pale Neck: Trachea midline,  no masses, no thyromegal,y no JVD, no carotid bruit OROPHARYNX:  Moist, No exudate/ erythema/lesions.  Heart: Regular rate and rhythm, without murmurs, rubs, gallops, PMI non-displaced, no heaves or thrills on palpation.  Lungs: Clear to auscultation, no wheezing or rhonchi noted. No increased vocal fremitus resonant to percussion  Abdomen: Soft, nontender, nondistended, positive bowel sounds, no masses no hepatosplenomegaly noted..  Neuro: No focal neurological deficits noted cranial nerves II through XII grossly intact. DTRs 2+ bilaterally upper and lower extremities. Strength functional in bilateral upper and lower extremities. Musculoskeletal: No warm swelling or erythema around joints, no spinal tenderness noted. Psychiatric: Patient alert and oriented x 3, good recent to remote recall. Lymph node survey: No cervical axillary or inguinal lymphadenopathy noted.   Data Reviewed: Basic Metabolic Panel:  Recent Labs Lab 08/04/14 2257 08/05/14 1613 08/08/14 0203 08/08/14 0328 08/08/14 0500 08/09/14 0525  NA 141 141 138  --  137 138  K 4.5 4.7 4.4  --  4.3 4.5  CL 105 109 102  --  104 102  CO2 24 20 24   --  23 24  GLUCOSE 110* 132* 118*  --  118* 103*  BUN 17 14 17   --  18 15  CREATININE 0.98 0.95 1.01 1.05 1.00 1.09  CALCIUM 9.2 9.0 9.4  --  8.6 8.6  MG  --   --   --  2.2  --   --    Liver Function Tests:  Recent Labs Lab 08/05/14 1613 08/08/14 0500  AST 47* 32  ALT 23 26  ALKPHOS 97 107  BILITOT 2.7* 2.2*  PROT 7.3 6.6  ALBUMIN 4.0 3.0*    Recent Labs Lab 08/05/14 1613  LIPASE 20   No results for input(s): AMMONIA in the last 168 hours. CBC:  Recent  Labs Lab 08/04/14 2257 08/05/14 1613 08/08/14 0203 08/08/14 0500 08/08/14 2040 08/09/14 0525  WBC 16.9* 21.0* 18.4* 16.4*  --  11.4*  NEUTROABS 12.0* 17.4* 14.0* 11.6*  --  7.3  HGB 7.7* 7.7* 5.4* 4.5* 5.8* 6.4*  HCT 22.5* 21.6* 15.9* 12.8* 17.0* 18.7*  MCV 93.4 91.1 91.4 89.5  --  87.4  PLT 337 296 359 300  --  361   Cardiac Enzymes: No results for input(s): CKTOTAL, CKMB, CKMBINDEX, TROPONINI in the last 168 hours. BNP (last 3 results) No results for input(s): PROBNP in the last 8760 hours. CBG: No results for input(s): GLUCAP in the last 168 hours.  Recent Results (from the past 240 hour(s))  Culture, blood (routine x 2)     Status:  None (Preliminary result)   Collection Time: 08/08/14  2:48 AM  Result Value Ref Range Status   Specimen Description BLOOD RIGHT ARM  Final   Special Requests BOTTLES DRAWN AEROBIC AND ANAEROBIC 5CC  Final   Culture  Setup Time   Final    08/08/2014 10:37 Performed at [facility]    Culture   Final           BLOOD CULTURE RECEIVED NO GROWTH TO DATE CULTURE WILL BE HELD FOR 5 DAYS BEFORE ISSUING A FINAL NEGATIVE REPORT Performed at [facility]    Report Status PENDING  Incomplete  Culture, blood (routine x 2)     Status: None (Preliminary result)   Collection Time: 08/08/14  2:49 AM  Result Value Ref Range Status   Specimen Description BLOOD LEFT FOREARM  Final   Special Requests BOTTLES DRAWN AEROBIC AND ANAEROBIC 5CC  Final   Culture  Setup Time   Final    08/08/2014 10:37 Performed at [facility]    Culture   Final           BLOOD CULTURE RECEIVED NO GROWTH TO DATE CULTURE WILL BE HELD FOR 5 DAYS BEFORE ISSUING A FINAL NEGATIVE REPORT Performed at [facility]    Report Status PENDING  Incomplete  MRSA PCR Screening     Status: None   Collection Time: 08/08/14  4:07 AM  Result Value Ref Range Status   MRSA by PCR NEGATIVE NEGATIVE Final    Comment:        The GeneXpert MRSA Assay  (FDA approved for NASAL specimens only), is one component of a comprehensive MRSA colonization surveillance program. It is not intended to diagnose MRSA infection nor to guide or monitor treatment for MRSA infections.   Culture, sputum-assessment     Status: None   Collection Time: 08/09/14 12:04 AM  Result Value Ref Range Status   Specimen Description SPUTUM  Final   Special Requests NONE  Final   Sputum evaluation   Final    MICROSCOPIC FINDINGS SUGGEST THAT THIS SPECIMEN IS NOT REPRESENTATIVE OF LOWER RESPIRATORY SECRETIONS. PLEASE RECOLLECT. SPOKE TO ACQUAAH,E RN AT 0122 11.25.15 BY TIBBITTS,K   Report Status 08/09/2014 FINAL  Final  Culture, expectorated sputum-assessment     Status: None   Collection Time: 08/09/14  2:05 AM  Result Value Ref Range Status   Specimen Description SPUTUM  Final   Special Requests Normal  Final   Sputum evaluation   Final    THIS SPECIMEN IS ACCEPTABLE. RESPIRATORY CULTURE REPORT TO FOLLOW.   Report Status 08/09/2014 FINAL  Final     Studies: Dg Chest 2 View  08/08/2014   CLINICAL DATA:  Sickle cell crisis.  Hypoxia and weakness.  EXAM: CHEST  2 VIEW  COMPARISON:  08/05/2014  FINDINGS: Mild cardiac enlargement. No pleural effusion or edema. Left posterior lung base opacity is identified and may reflect changes of acute chest. Atelectasis versus scar is noted in the right base. Skeletal changes consistent with sickle cell disease noted.  IMPRESSION: 1. Left base opacity which may reflect changes of acute chest or pneumonia.   Electronically Signed   By: Kerby Moors M.D.   On: 08/08/2014 02:29   Dg Chest 2 View  08/05/2014   CLINICAL DATA:  18 year old male with left-sided chest pain and shortness of breath. Sickle cell disease.  EXAM: CHEST  2 VIEW  COMPARISON:  01/09/2014 and prior chest radiographs dating back to 09/08/2006  FINDINGS: Upper limits normal heart size noted with mildly increased vascularity.  Minimal basilar atelectasis noted in  this mildly low volume film.  There is no evidence of focal airspace disease, pulmonary edema, suspicious pulmonary nodule/mass, pleural effusion, or pneumothorax. No acute bony abnormalities are identified.  IMPRESSION: Minimal bibasilar atelectasis without other evidence of acute abnormality.   Electronically Signed   By: Hassan Rowan M.D.   On: 08/05/2014 16:48    Scheduled Meds: . citalopram  40 mg Oral Daily  . folic acid  1 mg Oral Daily  . heparin  5,000 Units Subcutaneous 3 times per day  . ketorolac  30 mg Intravenous 4 times per day  . levofloxacin  750 mg Oral Daily  . morphine   Intravenous 6 times per day  . nicotine  14 mg Transdermal Daily  . senna-docusate  1 tablet Oral BID   Continuous Infusions: . dextrose 5 % and 0.45% NaCl 100 mL/hr at 08/09/14 1515    Total time spent 35 minutes.

## 2014-08-09 NOTE — Plan of Care (Signed)
Problem: Phase I Progression Outcomes Goal: Pulmonary Hygiene as Indicated (Sickle Cell) Outcome: Progressing

## 2014-08-09 NOTE — Progress Notes (Signed)
Clinical Social Work Department BRIEF PSYCHOSOCIAL ASSESSMENT 08/09/2014  Patient:  Jeffrey Morrison, Jeffrey Morrison     Account Number:  [id number]     Admit date:  08/08/2014  Clinical Social Worker:  Maryln Manuel  Date/Time:  08/09/2014 03:00 PM  Referred by:  Physician  Date Referred:  08/09/2014 Referred for  Homelessness   Other Referral:   Interview type:  Patient Other interview type:    PSYCHOSOCIAL DATA Living Status:  FAMILY Admitted from facility:   Level of care:   Primary support name:  Jeffrey Morrison/father/[phone] Primary support relationship to patient:  PARENT Degree of support available:   adequate    CURRENT CONCERNS Current Concerns  Other - See comment   Other Concerns:   homeless issues    SOCIAL WORK ASSESSMENT / PLAN CSW received notification from MD that pt may be homeless.    CSW met with pt at bedside. CSW introduced self and explained role. Pt reports that he is currently staying with his father and plans to return to pt fathers upon discharge. CSW inquired with pt if he need resources on shelters and pt states that he does not need any resources as he is confident that he can return to his dad's home. Pt reports that his father will be able to provide him transportation upon discharge.    No further social work needs identified at this time.    CSW signing off.   Assessment/plan status:  No Further Intervention Required Other assessment/ plan:   Information/referral to community resources:   no needs identified at this time; pt declined list of shelters    PATIENT'S/FAMILY'S RESPONSE TO PLAN OF CARE: Pt alert and oriented x 4. Pt pleasant and actively engaged in conversation. Pt plans to return to pt father's home when medicallly stable for discharge. Pt was appreciative of CSW visiting to check on him. CSW signing off.    Please re-consult if further social work needs arise.    Alison Murray, MSW, [facility] [phone]

## 2014-08-09 NOTE — Plan of Care (Signed)
Problem: Phase I Progression Outcomes Goal: Pt. aware of pain management plan Outcome: Completed/Met Date Met:  08/09/14     

## 2014-08-09 NOTE — Progress Notes (Addendum)
Lab called with hemoglobin of 6.4 today. Improved from 11/24 after 2 units of blood.

## 2014-08-09 NOTE — Plan of Care (Signed)
Problem: Consults Goal: Diabetes Guidelines if Diabetic/Glucose > 140 If diabetic or lab glucose is > 140 mg/dl - Initiate Diabetes/Hyperglycemia Guidelines & Document Interventions  Outcome: Not Applicable Date Met:  08/09/14     

## 2014-08-10 LAB — BASIC METABOLIC PANEL
Anion gap: 9 (ref 5–15)
BUN: 11 mg/dL (ref 6–23)
CALCIUM: 8.8 mg/dL (ref 8.4–10.5)
CO2: 23 mEq/L (ref 19–32)
CREATININE: 1.04 mg/dL (ref 0.50–1.35)
Chloride: 103 mEq/L (ref 96–112)
GFR calc Af Amer: >90 mL/min (ref >90)
GFR calc non Af Amer: >90 mL/min (ref >90)
GLUCOSE: 102 mg/dL — AB (ref 70–99)
Potassium: 4.5 mEq/L (ref 3.7–5.3)
Sodium: 135 mEq/L — ABNORMAL LOW (ref 137–147)

## 2014-08-10 LAB — LACTATE DEHYDROGENASE: LDH: 603 U/L — ABNORMAL HIGH (ref 94–250)

## 2014-08-10 LAB — CBC WITH DIFFERENTIAL/PLATELET
Basophils Absolute: 0.1 10*3/uL (ref 0.0–0.1)
Basophils Relative: 1 % (ref 0–1)
EOS ABS: 0.4 10*3/uL (ref 0.0–0.7)
Eosinophils Relative: 4 % (ref 0–5)
HCT: 18.5 % — ABNORMAL LOW (ref 39.0–52.0)
HEMOGLOBIN: 6.2 g/dL — AB (ref 13.0–17.0)
LYMPHS ABS: 2.4 10*3/uL (ref 0.7–4.0)
Lymphocytes Relative: 21 % (ref 12–46)
MCH: 29.8 pg (ref 26.0–34.0)
MCHC: 33.5 g/dL (ref 30.0–36.0)
MCV: 88.9 fL (ref 78.0–100.0)
MONO ABS: 1.7 10*3/uL — AB (ref 0.1–1.0)
Monocytes Relative: 15 % — ABNORMAL HIGH (ref 3–12)
NEUTROS PCT: 59 % (ref 43–77)
NRBC: 14 /100{WBCs} — AB
Neutro Abs: 6.6 10*3/uL (ref 1.7–7.7)
Platelets: 381 10*3/uL (ref 150–400)
RBC: 2.08 MIL/uL — ABNORMAL LOW (ref 4.22–5.81)
RDW: 20 % — AB (ref 11.5–15.5)
WBC: 11.2 10*3/uL — ABNORMAL HIGH (ref 4.0–10.5)

## 2014-08-10 MED ORDER — OXYCODONE-ACETAMINOPHEN 5-325 MG PO TABS
1.0000 | ORAL_TABLET | ORAL | Status: DC
  Administered 2014-08-10 – 2014-08-14 (×25): 1 via ORAL
  Filled 2014-08-10 (×26): qty 1

## 2014-08-10 NOTE — Plan of Care (Signed)
Problem: Phase III Progression Outcomes Goal: Progress with ambulation Outcome: Progressing Patient ambulated in hallway on evening of 11/25 on own.

## 2014-08-10 NOTE — Plan of Care (Signed)
Problem: Phase I Progression Outcomes Goal: Voiding Quantity Sufficient Outcome: Completed/Met Date Met:  08/10/14

## 2014-08-10 NOTE — Plan of Care (Signed)
Problem: Phase I Progression Outcomes Goal: Bowel Movement At Least Every 3 Days Outcome: Completed/Met Date Met:  08/10/14 Patient reports 2 bowel movements on 11/25

## 2014-08-10 NOTE — Progress Notes (Signed)
Subjective: A 18 yo man with sickle cell painful crisis on Dilaudid PCA. He has used 38.36 mg with 40 demands and 26 deliveries in the last 24 hours. Pain is now at 5/10 and localized to back and legs. No SOB, No Cough, no NVD. He has been able to walk around his room.  Objective: Vital signs in last 24 hours: Temp:  [97.5 F (36.4 C)-99.6 F (37.6 C)] 97.5 F (36.4 C) (11/26 1001) Pulse Rate:  [56-88] 56 (11/26 1001) Resp:  [14-22] 14 (11/26 1001) BP: (84-118)/(36-60) 84/36 mmHg (11/26 1001) SpO2:  [94 %-100 %] 100 % (11/26 1001) FiO2 (%):  [21 %-28 %] 21 % (11/26 0608) Weight:  [82.3 kg (181 lb 7 oz)] 82.3 kg (181 lb 7 oz) (11/26 0539) Weight change:  Last BM Date: 08/10/14  Intake/Output from previous day: 11/25 0701 - 11/26 0700 In: 3336.7 [P.O.:960; I.V.:2376.7] Out: 1050 [Urine:1050] Intake/Output this shift:    General appearance: alert, cooperative and no distress Head: Normocephalic, without obvious abnormality, atraumatic Neck: no adenopathy, no carotid bruit, no JVD, supple, symmetrical, trachea midline and thyroid not enlarged, symmetric, no tenderness/mass/nodules Back: symmetric, no curvature. ROM normal. No CVA tenderness. Resp: clear to auscultation bilaterally Chest wall: no tenderness Cardio: regular rate and rhythm, S1, S2 normal, no murmur, click, rub or gallop GI: soft, non-tender; bowel sounds normal; no masses,  no organomegaly Extremities: extremities normal, atraumatic, no cyanosis or edema Pulses: 2+ and symmetric Skin: Skin color, texture, turgor normal. No rashes or lesions Neurologic: Grossly normal  Lab Results:  Recent Labs  08/09/14 0525 08/10/14 0427  WBC 11.4* 11.2*  HGB 6.4* 6.2*  HCT 18.7* 18.5*  PLT 361 381   BMET  Recent Labs  08/09/14 0525 08/10/14 0427  NA 138 135*  K 4.5 4.5  CL 102 103  CO2 24 23  GLUCOSE 103* 102*  BUN 15 11  CREATININE 1.09 1.04  CALCIUM 8.6 8.8    Studies/Results: No results  found.  Medications: I have reviewed the patient's current medications.  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   #1. Sickle cell Painful crisis: patient seems to be doing better however still on the PCA. Will re-start his oral Percocet and make it scheduled next 24 hours to titrate him off the PCA. Will plan Dc next 24-48 hours.  #2 Acute chest syndrome: continue antibiotics and supportive care. Patient now on non-Tele floor.  #3 Sickle Cell hemolytic anemia: S/P transfusion of 2 units PRBC. Hemoglobin so far stable. Continue to monitor.  #4 Pneumonia: Complete treatment with Levaquin  LOS: 2 days   Nonie Lochner,LAWAL 08/10/2014, 11:02 AM

## 2014-08-10 NOTE — Progress Notes (Signed)
Total drug amount 24 hours: 38.36 Total demands in 24 hours : 40 Total delivered in 24 hours: 26  Total drug amount in 8 hours: 4.3 Total demands in 8 hours : 3 Total delivered in 8 hours : 3

## 2014-08-10 NOTE — Plan of Care (Signed)
Problem: Phase I Progression Outcomes Goal: Pain controlled with appropriate interventions Outcome: Progressing Patient reports pain in lower ribs and back. Pain 7-8/10. On PCA and toradol.

## 2014-08-11 LAB — CULTURE, RESPIRATORY W GRAM STAIN

## 2014-08-11 LAB — CULTURE, RESPIRATORY: Culture: NORMAL

## 2014-08-11 NOTE — Progress Notes (Addendum)
Subjective: Patient still having 7/10 pain this am. Has been placed on Percocet Q4hrs and his Morphine full dose PCA. He has used 23.43 mg of the morphine in 24 hours still requiring IVs. Has not been active since yesterday. No NVD, no SOB. Has been going up and down so far..  Objective: Vital signs in last 24 hours: Temp:  [98.1 F (36.7 C)-101.1 F (38.4 C)] 99.2 F (37.3 C) (11/27 1549) Pulse Rate:  [62-90] 90 (11/27 1400) Resp:  [11-19] 11 (11/27 1547) BP: (92-109)/(46-53) 109/53 mmHg (11/27 1400) SpO2:  [93 %-100 %] 98 % (11/27 1547) Weight change:  Last BM Date: 08/10/14  Intake/Output from previous day: 11/26 0701 - 11/27 0700 In: 480 [P.O.:480] Out: 2700 [Urine:2700] Intake/Output this shift: Total I/O In: -  Out: 725 [Urine:725]  General appearance: alert, cooperative and no distress Head: Normocephalic, without obvious abnormality, atraumatic Neck: no adenopathy, no carotid bruit, no JVD, supple, symmetrical, trachea midline and thyroid not enlarged, symmetric, no tenderness/mass/nodules Back: symmetric, no curvature. ROM normal. No CVA tenderness. Resp: clear to auscultation bilaterally Chest wall: no tenderness Cardio: regular rate and rhythm, S1, S2 normal, no murmur, click, rub or gallop GI: soft, non-tender; bowel sounds normal; no masses,  no organomegaly Extremities: extremities normal, atraumatic, no cyanosis or edema Pulses: 2+ and symmetric Skin: Skin color, texture, turgor normal. No rashes or lesions Neurologic: Grossly normal  Lab Results:  Recent Labs  08/09/14 0525 08/10/14 0427  WBC 11.4* 11.2*  HGB 6.4* 6.2*  HCT 18.7* 18.5*  PLT 361 381   BMET  Recent Labs  08/09/14 0525 08/10/14 0427  NA 138 135*  K 4.5 4.5  CL 102 103  CO2 24 23  GLUCOSE 103* 102*  BUN 15 11  CREATININE 1.09 1.04  CALCIUM 8.6 8.8    Studies/Results: No results found.  Medications: I have reviewed the patient's current medications.  Assessment/Plan: A  18 yo man with sickle cell painful crisis.   #1. Sickle cell Painful crisis: patient   still on the PCA. Will mobilize him today and consider DC this weekend on oral percocet.  #2 Acute chest syndrome: continue antibiotics and supportive care.  #3 Sickle Cell hemolytic anemia: S/P transfusion of 2 units PRBC. Hemoglobin so far stable. Continue to monitor.  #4 Pneumonia: Complete treatment with Levaquin for 7-10 days  LOS: 3 days   Roma Bierlein,LAWAL 08/11/2014, 3:59 PM

## 2014-08-12 LAB — CBC WITH DIFFERENTIAL/PLATELET
BASOS PCT: 1 % (ref 0–1)
Basophils Absolute: 0.1 10*3/uL (ref 0.0–0.1)
Eosinophils Absolute: 0.6 10*3/uL (ref 0.0–0.7)
Eosinophils Relative: 5 % (ref 0–5)
HCT: 17.3 % — ABNORMAL LOW (ref 39.0–52.0)
HEMOGLOBIN: 5.7 g/dL — AB (ref 13.0–17.0)
LYMPHS ABS: 2.7 10*3/uL (ref 0.7–4.0)
LYMPHS PCT: 23 % (ref 12–46)
MCH: 29.1 pg (ref 26.0–34.0)
MCHC: 32.9 g/dL (ref 30.0–36.0)
MCV: 88.3 fL (ref 78.0–100.0)
Monocytes Absolute: 1.6 10*3/uL — ABNORMAL HIGH (ref 0.1–1.0)
Monocytes Relative: 14 % — ABNORMAL HIGH (ref 3–12)
Neutro Abs: 6.7 10*3/uL (ref 1.7–7.7)
Neutrophils Relative %: 57 % (ref 43–77)
Platelets: 457 10*3/uL — ABNORMAL HIGH (ref 150–400)
RBC: 1.96 MIL/uL — ABNORMAL LOW (ref 4.22–5.81)
RDW: 20.5 % — ABNORMAL HIGH (ref 11.5–15.5)
WBC: 11.7 10*3/uL — ABNORMAL HIGH (ref 4.0–10.5)
nRBC: 11 /100 WBC — ABNORMAL HIGH

## 2014-08-12 LAB — COMPREHENSIVE METABOLIC PANEL
ALT: 81 U/L — ABNORMAL HIGH (ref 0–53)
AST: 50 U/L — AB (ref 0–37)
Albumin: 2.7 g/dL — ABNORMAL LOW (ref 3.5–5.2)
Alkaline Phosphatase: 109 U/L (ref 39–117)
Anion gap: 10 (ref 5–15)
BILIRUBIN TOTAL: 1.5 mg/dL — AB (ref 0.3–1.2)
BUN: 8 mg/dL (ref 6–23)
CO2: 26 mEq/L (ref 19–32)
Calcium: 8.7 mg/dL (ref 8.4–10.5)
Chloride: 101 mEq/L (ref 96–112)
Creatinine, Ser: 1.06 mg/dL (ref 0.50–1.35)
GFR calc Af Amer: >90 mL/min (ref >90)
GFR calc non Af Amer: >90 mL/min (ref >90)
Glucose, Bld: 95 mg/dL (ref 70–99)
Potassium: 4.6 mEq/L (ref 3.7–5.3)
Sodium: 137 mEq/L (ref 137–147)
Total Protein: 6.5 g/dL (ref 6.0–8.3)

## 2014-08-12 NOTE — Progress Notes (Signed)
Subjective: Patient still having 6/10 pain this am.Has not been out of bed today. He has been getting Percocet as scheduled with his PCA but using up to 45 mg in 24 hours. Has been complaining of Back pain mainly. No Fever, no NVD.  Objective: Vital signs in last 24 hours: Temp:  [98.5 F (36.9 C)-99.6 F (37.6 C)] 98.7 F (37.1 C) (11/28 1500) Pulse Rate:  [68-91] 68 (11/28 1500) Resp:  [13-20] 17 (11/28 1658) BP: (88-105)/(43-52) 102/46 mmHg (11/28 1500) SpO2:  [91 %-100 %] 100 % (11/28 1658) Weight:  [83.7 kg (184 lb 8.4 oz)] 83.7 kg (184 lb 8.4 oz) (11/28 9030) Weight change:  Last BM Date: 08/10/14  Intake/Output from previous day: 11/27 0701 - 11/28 0700 In: 1560 [P.O.:1560] Out: 3075 [Urine:3075] Intake/Output this shift: Total I/O In: 240 [P.O.:240] Out: 600 [Urine:600]  General appearance: alert, cooperative and no distress Head: Normocephalic, without obvious abnormality, atraumatic Neck: no adenopathy, no carotid bruit, no JVD, supple, symmetrical, trachea midline and thyroid not enlarged, symmetric, no tenderness/mass/nodules Back: symmetric, no curvature. ROM normal. No CVA tenderness. Resp: clear to auscultation bilaterally Chest wall: no tenderness Cardio: regular rate and rhythm, S1, S2 normal, no murmur, click, rub or gallop GI: soft, non-tender; bowel sounds normal; no masses,  no organomegaly Extremities: extremities normal, atraumatic, no cyanosis or edema Pulses: 2+ and symmetric Skin: Skin color, texture, turgor normal. No rashes or lesions Neurologic: Grossly normal  Lab Results:  Recent Labs  08/10/14 0427 08/12/14 0435  WBC 11.2* 11.7*  HGB 6.2* 5.7*  HCT 18.5* 17.3*  PLT 381 457*   BMET  Recent Labs  08/10/14 0427 08/12/14 0435  NA 135* 137  K 4.5 4.6  CL 103 101  CO2 23 26  GLUCOSE 102* 95  BUN 11 8  CREATININE 1.04 1.06  CALCIUM 8.8 8.7    Studies/Results: No results found.  Medications: I have reviewed the patient's  current medications.  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   #1. Sickle cell Painful crisis: patient still on the PCA. Will keep the PCA going and mobilize him again. Will consider higher dose of Percocet at Dc than the 5/325 mg he now has.  #2 Acute chest syndrome: continue antibiotics and supportive care.  #3 Sickle Cell hemolytic anemia: S/P transfusion of 2 units PRBC. Hemoglobin has dropped to 5.7. Will monitor and transfuse more if less than 5.5g  #4 Pneumonia: Complete treatment with Levaquin for 7-10 days  #5 Leucocytosis: Monitor WBC. Due to VOC.  LOS: 4 days   GARBA,LAWAL 08/12/2014, 5:11 PM

## 2014-08-13 LAB — CBC WITH DIFFERENTIAL/PLATELET
BASOS ABS: 0 10*3/uL (ref 0.0–0.1)
Basophils Relative: 0 % (ref 0–1)
EOS ABS: 0.8 10*3/uL — AB (ref 0.0–0.7)
Eosinophils Relative: 8 % — ABNORMAL HIGH (ref 0–5)
HCT: 16.9 % — ABNORMAL LOW (ref 39.0–52.0)
Hemoglobin: 5.6 g/dL — CL (ref 13.0–17.0)
LYMPHS ABS: 2.4 10*3/uL (ref 0.7–4.0)
Lymphocytes Relative: 24 % (ref 12–46)
MCH: 28.7 pg (ref 26.0–34.0)
MCHC: 33.1 g/dL (ref 30.0–36.0)
MCV: 86.7 fL (ref 78.0–100.0)
MONOS PCT: 16 % — AB (ref 3–12)
Monocytes Absolute: 1.6 10*3/uL — ABNORMAL HIGH (ref 0.1–1.0)
NEUTROS ABS: 5.3 10*3/uL (ref 1.7–7.7)
Neutrophils Relative %: 52 % (ref 43–77)
PLATELETS: 506 10*3/uL — AB (ref 150–400)
RBC: 1.95 MIL/uL — ABNORMAL LOW (ref 4.22–5.81)
RDW: 20.7 % — AB (ref 11.5–15.5)
WBC: 10.1 10*3/uL (ref 4.0–10.5)
nRBC: 6 /100 WBC — ABNORMAL HIGH

## 2014-08-13 LAB — COMPREHENSIVE METABOLIC PANEL
ALT: 68 U/L — ABNORMAL HIGH (ref 0–53)
AST: 35 U/L (ref 0–37)
Albumin: 2.7 g/dL — ABNORMAL LOW (ref 3.5–5.2)
Alkaline Phosphatase: 102 U/L (ref 39–117)
Anion gap: 10 (ref 5–15)
BUN: 10 mg/dL (ref 6–23)
CALCIUM: 8.7 mg/dL (ref 8.4–10.5)
CO2: 27 meq/L (ref 19–32)
CREATININE: 1.02 mg/dL (ref 0.50–1.35)
Chloride: 100 mEq/L (ref 96–112)
GLUCOSE: 103 mg/dL — AB (ref 70–99)
Potassium: 4.3 mEq/L (ref 3.7–5.3)
Sodium: 137 mEq/L (ref 137–147)
Total Bilirubin: 1 mg/dL (ref 0.3–1.2)
Total Protein: 6.4 g/dL (ref 6.0–8.3)

## 2014-08-13 LAB — LACTATE DEHYDROGENASE: LDH: 412 U/L — AB (ref 94–250)

## 2014-08-13 LAB — PREPARE RBC (CROSSMATCH)

## 2014-08-13 MED ORDER — OXYCODONE-ACETAMINOPHEN 5-325 MG PO TABS: 1.0000 | ORAL_TABLET | ORAL | Status: DC

## 2014-08-13 MED ORDER — LEVOFLOXACIN 750 MG PO TABS: 750.0000 mg | ORAL_TABLET | Freq: Every day | ORAL | Status: DC

## 2014-08-13 MED ORDER — SODIUM CHLORIDE 0.9 % IV SOLN
Freq: Once | INTRAVENOUS | Status: AC
  Administered 2014-08-13: via INTRAVENOUS

## 2014-08-13 NOTE — Progress Notes (Addendum)
Subjective: Patient is improved but still requiring high dose of IV Morphine. He has used 50.45 mg of the Morphine in the last 24 hours. Also used his Percocet as ordered. His hemoglobin has also dropped further. He is still weak, tired and not mobilizing well. No fever, no SOB, no cough, no NVD.  Objective: Vital signs in last 24 hours: Temp:  [97.5 F (36.4 C)-98.5 F (36.9 C)] 98.5 F (36.9 C) (11/29 1353) Pulse Rate:  [56-87] 64 (11/29 1353) Resp:  [11-19] 19 (11/29 1600) BP: (92-122)/(48-53) 122/53 mmHg (11/29 1353) SpO2:  [98 %-100 %] 100 % (11/29 1600) Weight:  [83.462 kg (184 lb)] 83.462 kg (184 lb) (11/29 0514) Weight change:  Last BM Date: 08/10/14  Intake/Output from previous day: 11/28 0701 - 11/29 0700 In: 720 [P.O.:720] Out: 1400 [Urine:1400] Intake/Output this shift: Total I/O In: 240 [P.O.:240] Out: 1000 [Urine:1000]  General appearance: alert, cooperative and no distress Head: Normocephalic, without obvious abnormality, atraumatic Neck: no adenopathy, no carotid bruit, no JVD, supple, symmetrical, trachea midline and thyroid not enlarged, symmetric, no tenderness/mass/nodules Back: symmetric, no curvature. ROM normal. No CVA tenderness. Resp: clear to auscultation bilaterally Chest wall: no tenderness Cardio: regular rate and rhythm, S1, S2 normal, no murmur, click, rub or gallop GI: soft, non-tender; bowel sounds normal; no masses,  no organomegaly Extremities: extremities normal, atraumatic, no cyanosis or edema Pulses: 2+ and symmetric Skin: Skin color, texture, turgor normal. No rashes or lesions Neurologic: Grossly normal  Lab Results:  Recent Labs  08/12/14 0435 08/13/14 0409  WBC 11.7* 10.1  HGB 5.7* 5.6*  HCT 17.3* 16.9*  PLT 457* 506*   BMET  Recent Labs  08/12/14 0435 08/13/14 0409  NA 137 137  K 4.6 4.3  CL 101 100  CO2 26 27  GLUCOSE 95 103*  BUN 8 10  CREATININE 1.06 1.02  CALCIUM 8.7 8.7    Studies/Results: No results  found.  Medications: I have reviewed the patient's current medications.  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   #1. Sickle cell Painful crisis: Will increase the dose of his Percocet to 10/325mg  and decrease his PCA. Continue care for another day.  #2 Acute chest syndrome: Resolved.  #3 Sickle Cell hemolytic anemia: S/P transfusion of 2 units PRBC. Hemoglobin has remained around 5.6. He needs to be able to get hydrea and other medications. I will check LDH and transfuse 1 more unit of PRBc so he can hopefully initiate hydrea.  #4 HCA Pneumonia: Complete treatment with Levaquin for 7-10 days  #5 Leucocytosis: Resolved. Due to VOC.  LOS: 5 days   GARBA,LAWAL 08/13/2014, 5:04 PM

## 2014-08-13 NOTE — Plan of Care (Signed)
Problem: Phase I Progression Outcomes Goal: Pain controlled with appropriate interventions Outcome: Progressing     

## 2014-08-13 NOTE — Plan of Care (Signed)
Problem: Phase II Progression Outcomes Goal: Tolerating diet Outcome: Completed/Met Date Met:  08/13/14     

## 2014-08-13 NOTE — Plan of Care (Signed)
Problem: Phase II Progression Outcomes Goal: Pain at, < goal with appropriate interventions Outcome: Progressing

## 2014-08-13 NOTE — Plan of Care (Signed)
Problem: Phase III Progression Outcomes Goal: Progress with ambulation Outcome: Progressing     

## 2014-08-13 NOTE — Plan of Care (Signed)
Problem: Consults Goal: Sickle Cell Crisis Patient Education Outcome: Completed/Met Date Met:  08/13/14  Problem: Phase III Progression Outcomes Goal: Pain controlled on oral analgesia Outcome: Progressing

## 2014-08-14 LAB — TYPE AND SCREEN
ABO/RH(D): A POS
ANTIBODY SCREEN: NEGATIVE
Unit division: 0

## 2014-08-14 LAB — CBC WITH DIFFERENTIAL/PLATELET
BASOS PCT: 1 % (ref 0–1)
Basophils Absolute: 0.1 10*3/uL (ref 0.0–0.1)
Eosinophils Absolute: 0.6 10*3/uL (ref 0.0–0.7)
Eosinophils Relative: 6 % — ABNORMAL HIGH (ref 0–5)
HCT: 21.8 % — ABNORMAL LOW (ref 39.0–52.0)
Hemoglobin: 7.2 g/dL — ABNORMAL LOW (ref 13.0–17.0)
LYMPHS PCT: 28 % (ref 12–46)
Lymphs Abs: 2.8 10*3/uL (ref 0.7–4.0)
MCH: 28.9 pg (ref 26.0–34.0)
MCHC: 33 g/dL (ref 30.0–36.0)
MCV: 87.6 fL (ref 78.0–100.0)
Monocytes Absolute: 1.2 10*3/uL — ABNORMAL HIGH (ref 0.1–1.0)
Monocytes Relative: 12 % (ref 3–12)
NEUTROS ABS: 5.3 10*3/uL (ref 1.7–7.7)
Neutrophils Relative %: 53 % (ref 43–77)
Platelets: 585 10*3/uL — ABNORMAL HIGH (ref 150–400)
RBC: 2.49 MIL/uL — ABNORMAL LOW (ref 4.22–5.81)
RDW: 19.1 % — AB (ref 11.5–15.5)
WBC: 10 10*3/uL (ref 4.0–10.5)

## 2014-08-14 LAB — COMPREHENSIVE METABOLIC PANEL
ALBUMIN: 2.7 g/dL — AB (ref 3.5–5.2)
ALT: 66 U/L — AB (ref 0–53)
AST: 40 U/L — ABNORMAL HIGH (ref 0–37)
Alkaline Phosphatase: 109 U/L (ref 39–117)
Anion gap: 9 (ref 5–15)
BUN: 7 mg/dL (ref 6–23)
CALCIUM: 8.8 mg/dL (ref 8.4–10.5)
CO2: 27 meq/L (ref 19–32)
Chloride: 99 mEq/L (ref 96–112)
Creatinine, Ser: 0.91 mg/dL (ref 0.50–1.35)
GFR calc Af Amer: >90 mL/min (ref >90)
Glucose, Bld: 92 mg/dL (ref 70–99)
Potassium: 4.5 mEq/L (ref 3.7–5.3)
SODIUM: 135 meq/L — AB (ref 137–147)
Total Bilirubin: 1.1 mg/dL (ref 0.3–1.2)
Total Protein: 6.5 g/dL (ref 6.0–8.3)

## 2014-08-14 LAB — CULTURE, BLOOD (ROUTINE X 2)
Culture: NO GROWTH
Culture: NO GROWTH

## 2014-08-14 MED ORDER — OXYCODONE-ACETAMINOPHEN 5-325 MG PO TABS: 1.0000 | ORAL_TABLET | Freq: Four times a day (QID) | ORAL | Status: DC | PRN

## 2014-08-14 NOTE — Discharge Summary (Addendum)
Physician Discharge Summary  Patient ID: Jeffrey Morrison MRN: [id number] DOB/AGE: April 02, 1996 18 y.o.  Admit date: 08/08/2014 Discharge date: 08/14/2014  Admission Diagnoses:  Discharge Diagnoses:  Active Problems:   Sickle cell crisis   Acute chest syndrome in sickle crisis   HCAP (healthcare-associated pneumonia)   Acute respiratory failure with hypoxia   Hypoxia   Anemia of chronic disease   Discharged Condition: good  Hospital Course: Patient was admitted with acute chest syndrome and Sickle Cell painful crisis. He was in jail recently and was off Narcotics for a while he was almost naive. He was treated with Morphine PCA and later transitioned to his home Percocet. He was transfused 3 units total of PRBC. His hemoglobin was above 7.0g at DC and will follow up for transition of care at the Sickle cell clinic next week. He was also placed on Levaquin and discharged on same for 5 more days for a total of 10 days treatment.  Consults: None  Significant Diagnostic Studies: labs: CBC's, CMP's and retic count checked serially and radiology: CXR: infiltrates: upper lobe bilaterally  Treatments: IV hydration, antibiotics: Levaquin and analgesia: Morphine  Discharge Exam: Blood pressure 125/67, pulse 67, temperature 98.7 F (37.1 C), temperature source Oral, resp. rate 16, height 5\' 8"  (1.727 m), weight 83.008 kg (183 lb), SpO2 98 %. General appearance: alert, cooperative and no distress Eyes: conjunctivae/corneas clear. PERRL, EOM's intact. Fundi benign. Nose: Nares normal. Septum midline. Mucosa normal. No drainage or sinus tenderness. Throat: lips, mucosa, and tongue normal; teeth and gums normal Neck: no adenopathy, no carotid bruit, no JVD, supple, symmetrical, trachea midline and thyroid not enlarged, symmetric, no tenderness/mass/nodules Back: symmetric, no curvature. ROM normal. No CVA tenderness. Resp: clear to auscultation bilaterally Chest wall: no tenderness Cardio:  regular rate and rhythm, S1, S2 normal, no murmur, click, rub or gallop GI: soft, non-tender; bowel sounds normal; no masses,  no organomegaly Extremities: extremities normal, atraumatic, no cyanosis or edema Pulses: 2+ and symmetric Lymph nodes: Cervical, supraclavicular, and axillary nodes normal. Neurologic: Grossly normal  Disposition: 01-Home or Self Care     Medication List    STOP taking these medications        citalopram 40 MG tablet  Commonly known as:  CELEXA     hydrOXYzine 50 MG tablet  Commonly known as:  ATARAX/VISTARIL     ibuprofen 200 MG tablet  Commonly known as:  ADVIL,MOTRIN      TAKE these medications        folic acid 1 MG tablet  Commonly known as:  FOLVITE  Take 1 tablet (1 mg total) by mouth daily.     hydroxyurea 500 MG capsule  Commonly known as:  HYDREA  Take 1 capsule (500 mg total) by mouth daily. May take with food to minimize GI side effects.     levofloxacin 750 MG tablet  Commonly known as:  LEVAQUIN  Take 1 tablet (750 mg total) by mouth daily.     oxyCODONE-acetaminophen 5-325 MG per tablet  Commonly known as:  PERCOCET  Take 1 tablet by mouth every 4 (four) hours.     oxyCODONE-acetaminophen 5-325 MG per tablet  Commonly known as:  PERCOCET  Take 1-2 tablets by mouth every 6 (six) hours as needed.     penicillin v potassium 500 MG tablet  Commonly known as:  VEETID  Take 1 tablet (500 mg total) by mouth 3 (three) times daily.     traMADol 50 MG tablet  Commonly known  as:  ULTRAM  Take 1 tablet (50 mg total) by mouth every 6 (six) hours as needed.           Follow-up Information    Follow up with Dorena Dew, FNP On 08/15/2014.   Specialty:  Family Medicine   Contact information:   [address] [place] [address] [phone]       Signed: Barbette Merino 08/14/2014, 11:42 AM  Time spent: 32 minutes

## 2014-08-14 NOTE — Plan of Care (Signed)
Problem: Phase I Progression Outcomes Goal: Pain controlled with appropriate interventions Outcome: Progressing  Problem: Phase III Progression Outcomes Goal: Pain controlled on oral analgesia Outcome: Progressing

## 2014-08-14 NOTE — Progress Notes (Signed)
CSW re-consulted by RN for transportation needs.   CSW met with pt at bedside and pt reports that he had anticipated that his father could pick him up, but his father will be unable to pick him up. Pt agreeable to bus pass.   CSW provided pt with bus pass.   No further social work needs identified.  CSW signing off.   Alison Murray, MSW, [facility] [phone]

## 2014-08-15 ENCOUNTER — Ambulatory Visit: Payer: Medicaid Other | Admitting: Family Medicine

## 2014-08-21 LAB — MICROALBUMIN, URINE: MICROALB UR: 7.7 mg/dL — AB (ref 0.00–1.89)

## 2014-09-10 ENCOUNTER — Encounter (HOSPITAL_COMMUNITY): Payer: Self-pay

## 2014-09-10 ENCOUNTER — Inpatient Hospital Stay (HOSPITAL_COMMUNITY)
Admission: EM | Admit: 2014-09-10 | Discharge: 2014-09-12 | DRG: 812 | Disposition: A | Discharge status: Home / Self Care | Payer: Medicaid Other | Attending: Internal Medicine | Admitting: Internal Medicine

## 2014-09-10 ENCOUNTER — Emergency Department (HOSPITAL_COMMUNITY): Payer: Medicaid Other

## 2014-09-10 DIAGNOSIS — D72829 Elevated white blood cell count, unspecified: Secondary | ICD-10-CM | POA: Diagnosis present

## 2014-09-10 DIAGNOSIS — D5700 Hb-SS disease with crisis, unspecified: Principal | ICD-10-CM | POA: Diagnosis present

## 2014-09-10 DIAGNOSIS — M545 Low back pain: Secondary | ICD-10-CM | POA: Diagnosis present

## 2014-09-10 DIAGNOSIS — R509 Fever, unspecified: Secondary | ICD-10-CM

## 2014-09-10 DIAGNOSIS — F17210 Nicotine dependence, cigarettes, uncomplicated: Secondary | ICD-10-CM | POA: Diagnosis present

## 2014-09-10 DIAGNOSIS — G8929 Other chronic pain: Secondary | ICD-10-CM | POA: Diagnosis present

## 2014-09-10 LAB — CBC WITH DIFFERENTIAL/PLATELET
BASOS PCT: 0 % (ref 0–1)
Basophils Absolute: 0 10*3/uL (ref 0.0–0.1)
Basophils Absolute: 0 10*3/uL (ref 0.0–0.1)
Basophils Relative: 0 % (ref 0–1)
EOS ABS: 0.2 10*3/uL (ref 0.0–0.7)
EOS PCT: 1 % (ref 0–5)
Eosinophils Absolute: 0 10*3/uL (ref 0.0–0.7)
Eosinophils Relative: 0 % (ref 0–5)
HCT: 28.4 % — ABNORMAL LOW (ref 39.0–52.0)
HEMATOCRIT: 22 % — AB (ref 39.0–52.0)
Hemoglobin: 9.7 g/dL — ABNORMAL LOW (ref 13.0–17.0)
Hemoglobin: 7.4 g/dL — ABNORMAL LOW (ref 13.0–17.0)
LYMPHS ABS: 2.5 10*3/uL (ref 0.7–4.0)
LYMPHS ABS: 3.2 10*3/uL (ref 0.7–4.0)
Lymphocytes Relative: 10 % — ABNORMAL LOW (ref 12–46)
Lymphocytes Relative: 16 % (ref 12–46)
MCH: 31.1 pg (ref 26.0–34.0)
MCH: 30.8 pg (ref 26.0–34.0)
MCHC: 34.2 g/dL (ref 30.0–36.0)
MCHC: 33.6 g/dL (ref 30.0–36.0)
MCV: 91 fL (ref 78.0–100.0)
MCV: 91.7 fL (ref 78.0–100.0)
MONO ABS: 2.5 10*3/uL — AB (ref 0.1–1.0)
MONO ABS: 2.4 10*3/uL — AB (ref 0.1–1.0)
Monocytes Relative: 10 % (ref 3–12)
Monocytes Relative: 12 % (ref 3–12)
NRBC: 18 /100{WBCs} — AB
Neutro Abs: 19.6 10*3/uL — ABNORMAL HIGH (ref 1.7–7.7)
Neutro Abs: 14.6 10*3/uL — ABNORMAL HIGH (ref 1.7–7.7)
Neutrophils Relative %: 79 % — ABNORMAL HIGH (ref 43–77)
Neutrophils Relative %: 72 % (ref 43–77)
PLATELETS: 366 10*3/uL (ref 150–400)
Platelets: 290 10*3/uL (ref 150–400)
RBC: 3.12 MIL/uL — ABNORMAL LOW (ref 4.22–5.81)
RBC: 2.4 MIL/uL — ABNORMAL LOW (ref 4.22–5.81)
RDW: 25.5 % — AB (ref 11.5–15.5)
RDW: 24.5 % — ABNORMAL HIGH (ref 11.5–15.5)
WBC: 24.8 10*3/uL — ABNORMAL HIGH (ref 4.0–10.5)
WBC: 20.2 10*3/uL — ABNORMAL HIGH (ref 4.0–10.5)

## 2014-09-10 LAB — RETICULOCYTES
RBC.: 3.12 MIL/uL — ABNORMAL LOW (ref 4.22–5.81)
RBC.: 2.4 MIL/uL — ABNORMAL LOW (ref 4.22–5.81)
Retic Count, Absolute: 528 10*3/uL — ABNORMAL HIGH (ref 19.0–186.0)
Retic Ct Pct: 22 % — ABNORMAL HIGH (ref 0.4–3.1)

## 2014-09-10 LAB — MAGNESIUM: Magnesium: 2.1 mg/dL (ref 1.5–2.5)

## 2014-09-10 LAB — COMPREHENSIVE METABOLIC PANEL
ALT: 22 U/L (ref 0–53)
ALT: 17 U/L (ref 0–53)
AST: 40 U/L — ABNORMAL HIGH (ref 0–37)
AST: 29 U/L (ref 0–37)
Albumin: 4 g/dL (ref 3.5–5.2)
Albumin: 3.4 g/dL — ABNORMAL LOW (ref 3.5–5.2)
Alkaline Phosphatase: 116 U/L (ref 39–117)
Alkaline Phosphatase: 95 U/L (ref 39–117)
Anion gap: 7 (ref 5–15)
Anion gap: 3 — ABNORMAL LOW (ref 5–15)
BILIRUBIN TOTAL: 2.8 mg/dL — AB (ref 0.3–1.2)
BUN: 6 mg/dL (ref 6–23)
BUN: 9 mg/dL (ref 6–23)
CO2: 23 mmol/L (ref 19–32)
CO2: 26 mmol/L (ref 19–32)
CREATININE: 1.05 mg/dL (ref 0.50–1.35)
Calcium: 9.4 mg/dL (ref 8.4–10.5)
Calcium: 8.6 mg/dL (ref 8.4–10.5)
Chloride: 107 mEq/L (ref 96–112)
Chloride: 110 mEq/L (ref 96–112)
Creatinine, Ser: 1.03 mg/dL (ref 0.50–1.35)
GFR calc Af Amer: >90 mL/min (ref >90)
GFR calc Af Amer: >90 mL/min (ref >90)
GFR calc non Af Amer: >90 mL/min (ref >90)
GLUCOSE: 119 mg/dL — AB (ref 70–99)
Glucose, Bld: 106 mg/dL — ABNORMAL HIGH (ref 70–99)
Potassium: 3.7 mmol/L (ref 3.5–5.1)
Potassium: 4 mmol/L (ref 3.5–5.1)
Sodium: 137 mmol/L (ref 135–145)
Sodium: 139 mmol/L (ref 135–145)
Total Bilirubin: 2.5 mg/dL — ABNORMAL HIGH (ref 0.3–1.2)
Total Protein: 7.3 g/dL (ref 6.0–8.3)
Total Protein: 6.3 g/dL (ref 6.0–8.3)

## 2014-09-10 LAB — URINALYSIS, ROUTINE W REFLEX MICROSCOPIC
BILIRUBIN URINE: NEGATIVE
Glucose, UA: NEGATIVE mg/dL
Hgb urine dipstick: NEGATIVE
KETONES UR: NEGATIVE mg/dL
Leukocytes, UA: NEGATIVE
NITRITE: NEGATIVE
Protein, ur: NEGATIVE mg/dL
SPECIFIC GRAVITY, URINE: 1.016 (ref 1.005–1.030)
Urobilinogen, UA: 1 mg/dL (ref 0.0–1.0)
pH: 7.5 (ref 5.0–8.0)

## 2014-09-10 LAB — LACTATE DEHYDROGENASE: LDH: 321 U/L — AB (ref 94–250)

## 2014-09-10 LAB — I-STAT CG4 LACTIC ACID, ED: Lactic Acid, Venous: 0.51 mmol/L (ref 0.5–2.2)

## 2014-09-10 MED ORDER — ENOXAPARIN SODIUM 40 MG/0.4ML ~~LOC~~ SOLN
40.0000 mg | SUBCUTANEOUS | Status: DC
  Administered 2014-09-10 – 2014-09-11 (×2): 40 mg via SUBCUTANEOUS
  Filled 2014-09-10 (×3): qty 0.4

## 2014-09-10 MED ORDER — ONDANSETRON HCL 4 MG/2ML IJ SOLN
4.0000 mg | Freq: Once | INTRAMUSCULAR | Status: AC
  Administered 2014-09-10: 4 mg via INTRAVENOUS
  Filled 2014-09-10: qty 2

## 2014-09-10 MED ORDER — HYDROMORPHONE HCL 1 MG/ML IJ SOLN
2.0000 mg | Freq: Once | INTRAMUSCULAR | Status: AC
  Administered 2014-09-10: 2 mg via INTRAVENOUS
  Filled 2014-09-10: qty 2

## 2014-09-10 MED ORDER — ONDANSETRON HCL 4 MG/2ML IJ SOLN: 4.0000 mg | Freq: Three times a day (TID) | INTRAMUSCULAR | Status: AC | PRN

## 2014-09-10 MED ORDER — SODIUM CHLORIDE 0.9 % IV BOLUS (SEPSIS): 1000.0000 mL | Freq: Once | INTRAVENOUS | Status: DC

## 2014-09-10 MED ORDER — KETOROLAC TROMETHAMINE 30 MG/ML IJ SOLN
30.0000 mg | Freq: Once | INTRAMUSCULAR | Status: AC
  Administered 2014-09-10: 30 mg via INTRAVENOUS
  Filled 2014-09-10: qty 1

## 2014-09-10 MED ORDER — HYDROMORPHONE HCL 1 MG/ML IJ SOLN
1.0000 mg | Freq: Once | INTRAMUSCULAR | Status: AC
  Administered 2014-09-10: 1 mg via INTRAVENOUS
  Filled 2014-09-10: qty 1

## 2014-09-10 MED ORDER — SENNOSIDES-DOCUSATE SODIUM 8.6-50 MG PO TABS
1.0000 | ORAL_TABLET | Freq: Two times a day (BID) | ORAL | Status: DC
  Administered 2014-09-11 – 2014-09-12 (×3): 1 via ORAL
  Filled 2014-09-10 (×4): qty 1

## 2014-09-10 MED ORDER — POLYETHYLENE GLYCOL 3350 17 G PO PACK
17.0000 g | PACK | Freq: Every day | ORAL | Status: DC | PRN
  Filled 2014-09-10: qty 1

## 2014-09-10 MED ORDER — FOLIC ACID 1 MG PO TABS
1.0000 mg | ORAL_TABLET | Freq: Every day | ORAL | Status: DC
  Administered 2014-09-10 – 2014-09-12 (×3): 1 mg via ORAL
  Filled 2014-09-10 (×3): qty 1

## 2014-09-10 MED ORDER — HYDROMORPHONE HCL 1 MG/ML IJ SOLN
1.0000 mg | INTRAMUSCULAR | Status: AC | PRN
  Administered 2014-09-10 – 2014-09-11 (×3): 1 mg via INTRAVENOUS
  Filled 2014-09-10 (×3): qty 1

## 2014-09-10 NOTE — H&P (Signed)
[facility] Admission History and Physical       Jeffrey Morrison:[id number] DOB: Nov 04, 1995 DOA: 09/10/2014  Referring physician: EDP PCP: No PCP Per Patient  Specialists:   Chief Complaint: Sickle Cell Pain  HPI: Jeffrey Morrison is a 18 y.o. male with Sickle Cell Disease who presents with complaints of sever 10/10 pain in his left arm, /elbow, right leg and lower ADB x 2 days.  He reports running out of his pain Rx, and his last PAin Rx was written by Dr. Zigmund Daniel of the [facility] when he had a crisis in November.     He denies having any fever or chills or chest pain or URI Sxs.  He continued to have severe pain despite pain medications that were administered in the ED.  He was referred for admission.     Review of Systems:  Constitutional: No Weight Loss, No Weight Gain, Night Sweats, Fevers, Chills, Dizziness, Fatigue, or Generalized Weakness HEENT: No Headaches, Difficulty Swallowing,Tooth/Dental Problems,Sore Throat,  No Sneezing, Rhinitis, Ear Ache, Nasal Congestion, or Post Nasal Drip,  Cardio-vascular:  No Chest pain, Orthopnea, PND, Edema in Lower Extremities, Anasarca, Dizziness, Palpitations  Resp: No Dyspnea, No DOE, No Productive Cough, No Non-Productive Cough, No Hemoptysis, No Wheezing.    GI: No Heartburn, Indigestion, Abdominal Pain, Nausea, Vomiting, Diarrhea, Hematemesis, Hematochezia, Melena, Change in Bowel Habits,  Loss of Appetite  GU: No Dysuria, Change in Color of Urine, No Urgency or Frequency, No Flank pain.  Musculoskeletal: +Right Knee and Left Elbow Pain,  No Swelling, No Decreased Range of Motion, No Back Pain.  Neurologic: No Syncope, No Seizures, Muscle Weakness, Paresthesia, Vision Disturbance or Loss, No Diplopia, No Vertigo, No Difficulty Walking,  Skin: No Rash or Lesions. Psych: No Change in Mood or Affect, No Depression or Anxiety, No Memory loss, No Confusion, or Hallucinations   Past Medical History  Diagnosis Date  .  Sickle cell crisis   . Chronic lower back pain       Past Surgical History  Procedure Laterality Date  . Splenectomy         Prior to Admission medications   Medication Sig Start Date End Date Taking? Authorizing Provider  ibuprofen (ADVIL,MOTRIN) 400 MG tablet Take 400 mg by mouth every 6 (six) hours as needed for mild pain or moderate pain.   Yes Historical Provider, MD  oxyCODONE-acetaminophen (PERCOCET) 5-325 MG per tablet Take 1-2 tablets by mouth every 6 (six) hours as needed. 08/14/14  Yes Elwyn Reach, MD  folic acid (FOLVITE) 1 MG tablet Take 1 tablet (1 mg total) by mouth daily. Patient not taking: Reported on 09/10/2014 03/21/14   Elmyra Ricks Pisciotta, PA-C  hydroxyurea (HYDREA) 500 MG capsule Take 1 capsule (500 mg total) by mouth daily. May take with food to minimize GI side effects. Patient not taking: Reported on 09/10/2014 03/21/14   Elmyra Ricks Pisciotta, PA-C  levofloxacin (LEVAQUIN) 750 MG tablet Take 1 tablet (750 mg total) by mouth daily. Patient not taking: Reported on 09/10/2014 08/13/14   Elwyn Reach, MD  oxyCODONE-acetaminophen (PERCOCET) 5-325 MG per tablet Take 1 tablet by mouth every 4 (four) hours. Patient not taking: Reported on 09/10/2014 08/13/14   Elwyn Reach, MD  penicillin v potassium (VEETID) 500 MG tablet Take 1 tablet (500 mg total) by mouth 3 (three) times daily. Patient not taking: Reported on 09/10/2014 03/21/14   Elmyra Ricks Pisciotta, PA-C  traMADol (ULTRAM) 50 MG tablet Take 1 tablet (50 mg total) by mouth  every 6 (six) hours as needed. Patient not taking: Reported on 09/10/2014 08/05/14   Noland Fordyce, PA-C      No Known Allergies   Social History:  reports that he has been smoking Cigarettes.  He has been smoking about 0.50 packs per day. He does not have any smokeless tobacco history on file. He reports that he does not drink alcohol or use illicit drugs.     No family history on file.     Physical Exam:  GEN:  Pleasant  18 y.o.  male  examined  and in no acute distress; cooperative with exam Filed Vitals:   09/10/14 1730 09/10/14 1830 09/10/14 1920 09/10/14 1930  BP: 108/56 112/50 97/46 104/53  Pulse: 74 72 68 69  Temp:      TempSrc:      Resp: 18 20 16 16   Height:      Weight:      SpO2: 90% 92% 94% 92%   Blood pressure 104/53, pulse 69, temperature 100.6 F (38.1 C), temperature source Rectal, resp. rate 16, height 5\' 9"  (1.753 m), weight 83.008 kg (183 lb), SpO2 92 %. PSYCH: He is alert and oriented x4; does not appear anxious does not appear depressed; affect is normal HEENT: Normocephalic and Atraumatic, Mucous membranes pink; PERRLA; EOM intact; Fundi:  Benign;  No scleral icterus, Nares: Patent, Oropharynx: Clear, Fair Dentition,    Neck:  FROM, No Cervical Lymphadenopathy nor Thyromegaly or Carotid Bruit; No JVD; Breasts:: Not examined CHEST WALL: No tenderness CHEST: Normal respiration, clear to auscultation bilaterally HEART: Regular rate and rhythm; no murmurs rubs or gallops BACK: No kyphosis or scoliosis; No CVA tenderness ABDOMEN: Positive Bowel Sounds,  Soft Non-Tender; No Masses, No Organomegaly, No Pannus; No Intertriginous candida. Rectal Exam: Not done EXTREMITIES: No Cyanosis, Clubbing, or Edema; No Ulcerations. Genitalia: not examined PULSES: 2+ and symmetric SKIN: Normal hydration no rash or ulceration CNS: Alert and Oriented x 4, No Focal Deficits  Vascular: pulses palpable throughout    Labs on Admission:  Basic Metabolic Panel:  Recent Labs Lab 09/10/14 1526  NA 137  K 3.7  CL 107  CO2 23  GLUCOSE 106*  BUN 6  CREATININE 1.03  CALCIUM 9.4   Liver Function Tests:  Recent Labs Lab 09/10/14 1526  AST 40*  ALT 22  ALKPHOS 116  BILITOT 2.5*  PROT 7.3  ALBUMIN 4.0   No results for input(s): LIPASE, AMYLASE in the last 168 hours. No results for input(s): AMMONIA in the last 168 hours. CBC:  Recent Labs Lab 09/10/14 1526  WBC 24.8*  NEUTROABS 19.6*  HGB  9.7*  HCT 28.4*  MCV 91.0  PLT 366   Cardiac Enzymes: No results for input(s): CKTOTAL, CKMB, CKMBINDEX, TROPONINI in the last 168 hours.  BNP (last 3 results) No results for input(s): PROBNP in the last 8760 hours. CBG: No results for input(s): GLUCAP in the last 168 hours.  Radiological Exams on Admission: Dg Chest 2 View  09/10/2014   CLINICAL DATA:  Sickle cell crisis with pain  EXAM: CHEST  2 VIEW  COMPARISON:  08/08/2014  FINDINGS: The heart size and mediastinal contours are within normal limits. Both lungs are clear. The visualized skeletal structures are unremarkable.  IMPRESSION: No active cardiopulmonary disease.   Electronically Signed   By: Inez Catalina M.D.   On: 09/10/2014 17:00     EKG: Independently reviewed.    Assessment/Plan:   18 y.o. male with   Active Problems:  1.   Sickle cell pain crisis   Pain Control PRN    May need a PCA Dilaudid Pump     2.   Leukocytosis- STress Rxn from #1   Monitor Trend     3.   DVT Prophylaxis   Lovenox    Code Status:   FULL CODE Family Communication:    No Family Present Disposition Plan:  Med Surg Bed       Time spent:  [facility] Pager [phone]   If [place] Please Contact the Day Rounding Team MD for [facility]  If 7PM-7AM, Please Contact Night-Floor Coverage  [email or web address] Password [facility] 09/10/2014, 8:35 PM

## 2014-09-10 NOTE — ED Notes (Addendum)
Pt reports he is a sickle cell pt.  Pt reports generalized body pain that started yesterday.  Pt states he is out of pain medicine.

## 2014-09-10 NOTE — Progress Notes (Signed)
[facility] progress note. Chief complaint. Transfer note. History of present illness. This 18 year old male with known sickle cell disease presents with arm and leg pain. Was diagnosed with sickle cell crisis and transferred to [facility] for further management. The patient is now arrived and I'm seeing him at bedside to ensure he remains clinically stable and that his orders transferred appropriately. Vital signs. Temperature 98.3, pulse 74, respiration 16, blood pressure 121/54. O2 sats 97%. General appearance. Well-developed male who is alert and in no distress. Cardiac. Rate and rhythm regular. Lungs. Breath sounds clear and equal. Abdomen. Soft with positive bowel sounds. Impression/plan. Problem #1. Sickle cell crisis. Management with Dilaudid and Toradol for sickle cell crisis pain. On Lovenox for DVT prophylaxis. All orders appear transferred appropriately. Patient appears clinically stable at time of visit.

## 2014-09-10 NOTE — ED Provider Notes (Signed)
CSN: [id number]     Arrival date & time 09/10/14  1413 History   First MD Initiated Contact with Patient 09/10/14 1600     Chief Complaint  Patient presents with  . Sickle Cell Pain Crisis     (Consider location/radiation/quality/duration/timing/severity/associated sxs/prior Treatment) HPI Comments: patient reports 2 day history of typical sickle cell pain all over his body. He endorses pain to his left arm, right knee, left flank and abdomen. 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 He takes oxycodone at home but is out. Denies any fevers, chills, nausea or vomiting. Denies any chest pain or shortness of breath. Denies any cough, runny nose or sore throat.  The history is provided by the patient.    Past Medical History  Diagnosis Date  . Sickle cell crisis   . Chronic lower back pain    Past Surgical History  Procedure Laterality Date  . Splenectomy     No family history on file. History  Substance Use Topics  . Smoking status: Current Every Day Smoker -- 0.50 packs/day    Types: Cigarettes  . Smokeless tobacco: Not on file  . Alcohol Use: No    Review of Systems  Constitutional: Positive for activity change and appetite change. Negative for fever.  HENT: Negative for congestion and rhinorrhea.   Respiratory: Negative for cough, chest tightness and shortness of breath.   Cardiovascular: Negative for chest pain.  Gastrointestinal: Positive for abdominal pain. Negative for nausea and vomiting.  Genitourinary: Negative for dysuria and hematuria.  Musculoskeletal: Positive for myalgias, back pain and arthralgias.  Skin: Negative for rash.  Neurological: Negative for dizziness, weakness and headaches.  A complete 10 system review of systems was obtained and all systems are negative except as noted in the HPI and PMH.      Allergies  Review of patient's allergies indicates no known allergies.  Home Medications   Prior to Admission medications    Medication Sig Start Date End Date Taking? Authorizing Provider  ibuprofen (ADVIL,MOTRIN) 400 MG tablet Take 400 mg by mouth every 6 (six) hours as needed for mild pain or moderate pain.   Yes Historical Provider, MD  oxyCODONE-acetaminophen (PERCOCET) 5-325 MG per tablet Take 1-2 tablets by mouth every 6 (six) hours as needed. 08/14/14  Yes Elwyn Reach, MD  folic acid (FOLVITE) 1 MG tablet Take 1 tablet (1 mg total) by mouth daily. Patient not taking: Reported on 09/10/2014 03/21/14   Elmyra Ricks Pisciotta, PA-C  hydroxyurea (HYDREA) 500 MG capsule Take 1 capsule (500 mg total) by mouth daily. May take with food to minimize GI side effects. Patient not taking: Reported on 09/10/2014 03/21/14   Elmyra Ricks Pisciotta, PA-C  levofloxacin (LEVAQUIN) 750 MG tablet Take 1 tablet (750 mg total) by mouth daily. Patient not taking: Reported on 09/10/2014 08/13/14   Elwyn Reach, MD  oxyCODONE-acetaminophen (PERCOCET) 5-325 MG per tablet Take 1 tablet by mouth every 4 (four) hours. Patient not taking: Reported on 09/10/2014 08/13/14   Elwyn Reach, MD  penicillin v potassium (VEETID) 500 MG tablet Take 1 tablet (500 mg total) by mouth 3 (three) times daily. Patient not taking: Reported on 09/10/2014 03/21/14   Elmyra Ricks Pisciotta, PA-C  traMADol (ULTRAM) 50 MG tablet Take 1 tablet (50 mg total) by mouth every 6 (six) hours as needed. Patient not taking: Reported on 09/10/2014 08/05/14   Noland Fordyce, PA-C   BP 121/54 mmHg  Pulse 74  Temp(Src) 98.3 F (36.8 C) (Oral)  Resp  16  Ht 5\' 9"  (1.753 m)  Wt 180 lb 9.6 oz (81.92 kg)  BMI 26.66 kg/m2  SpO2 97% Physical Exam  Constitutional: He is oriented to person, place, and time. He appears well-developed and well-nourished. No distress.  HENT:  Head: Normocephalic and atraumatic.  Mouth/Throat: Oropharynx is clear and moist. No oropharyngeal exudate.  Eyes: Conjunctivae and EOM are normal. Pupils are equal, round, and reactive to light.  Neck: Normal  range of motion. Neck supple.  No meningismus.  Cardiovascular: Normal rate, regular rhythm, normal heart sounds and intact distal pulses.   No murmur heard. Pulmonary/Chest: Effort normal and breath sounds normal. No respiratory distress.  Abdominal: Soft. There is tenderness. There is no rebound and no guarding.  Left-sided abdominal tenderness  Musculoskeletal: Normal range of motion. He exhibits tenderness. He exhibits no edema.  uncomfortablte Tender to the left upper arm, right leg, right knee. Full range of motion of all major joints without erythema.  Neurological: He is alert and oriented to person, place, and time. No cranial nerve deficit. He exhibits normal muscle tone. Coordination normal.  No ataxia on finger to nose bilaterally. No pronator drift. 5/5 strength throughout. CN 2-12 intact. Negative Romberg. Equal grip strength. Sensation intact. Gait is normal.   Skin: Skin is warm.  Psychiatric: He has a normal mood and affect. His behavior is normal.  Nursing note and vitals reviewed.   ED Course  Procedures (including critical care time) Labs Review Labs Reviewed  CBC WITH DIFFERENTIAL - Abnormal; Notable for the following:    WBC 24.8 (*)    RBC 3.12 (*)    Hemoglobin 9.7 (*)    HCT 28.4 (*)    RDW 25.5 (*)    Neutrophils Relative % 79 (*)    Lymphocytes Relative 10 (*)    Neutro Abs 19.6 (*)    Monocytes Absolute 2.5 (*)    All other components within normal limits  COMPREHENSIVE METABOLIC PANEL - Abnormal; Notable for the following:    Glucose, Bld 106 (*)    AST 40 (*)    Total Bilirubin 2.5 (*)    All other components within normal limits  RETICULOCYTES - Abnormal; Notable for the following:    RBC. 3.12 (*)    All other components within normal limits  URINALYSIS, ROUTINE W REFLEX MICROSCOPIC - Abnormal; Notable for the following:    Color, Urine AMBER (*)    All other components within normal limits  COMPREHENSIVE METABOLIC PANEL - Abnormal; Notable  for the following:    Glucose, Bld 119 (*)    Albumin 3.4 (*)    Total Bilirubin 2.8 (*)    Anion gap 3 (*)    All other components within normal limits  LACTATE DEHYDROGENASE - Abnormal; Notable for the following:    LDH 321 (*)    All other components within normal limits  CBC WITH DIFFERENTIAL - Abnormal; Notable for the following:    WBC 20.2 (*)    RBC 2.40 (*)    Hemoglobin 7.4 (*)    HCT 22.0 (*)    RDW 24.5 (*)    nRBC 18 (*)    Neutro Abs 14.6 (*)    Monocytes Absolute 2.4 (*)    All other components within normal limits  RETICULOCYTES - Abnormal; Notable for the following:    Retic Ct Pct 22.0 (*)    RBC. 2.40 (*)    Retic Count, Manual 528.0 (*)    All other components within normal  limits  CULTURE, BLOOD (ROUTINE X 2)  CULTURE, BLOOD (ROUTINE X 2)  MAGNESIUM  I-STAT CG4 LACTIC ACID, ED    Imaging Review Dg Chest 2 View  09/10/2014   CLINICAL DATA:  Sickle cell crisis with pain  EXAM: CHEST  2 VIEW  COMPARISON:  08/08/2014  FINDINGS: The heart size and mediastinal contours are within normal limits. Both lungs are clear. The visualized skeletal structures are unremarkable.  IMPRESSION: No active cardiopulmonary disease.   Electronically Signed   By: Inez Catalina M.D.   On: 09/10/2014 17:00     EKG Interpretation   Date/Time:  Sunday September 10 2014 16:34:34 EST Ventricular Rate:  76 PR Interval:  150 QRS Duration: 94 QT Interval:  379 QTC Calculation: 426 R Axis:   73 Text Interpretation:  Sinus rhythm No significant change was found  Confirmed by Wyvonnia Dusky  MD, Shunna Mikaelian [phone]) on 09/10/2014 4:51:00 PM      MDM   Final diagnoses:  Sickle cell pain crisis  Other specified fever   Typical sickle cell pain crisis for the past 2 days. No fever, chest pain or cough  Hemoglobin improved from baseline. 9.7. leukocytosis of 25. Chest x-ray negative. Likely hemoconcentrated. Patient with fever of 100.6 in the ED.  Chest x-ray negative. Urinalysis  negative. Lactate normal.  Patient's pain persists despite multiple doses of IV narcotics in the ED. With his leukocytosis and fever, he will be admitted for observation. Blood cultures obtained. No antibiotics started is no identified source of infection at this time. Likely viral syndrome.  Discussed with Dr. Arnoldo Morale.  Ezequiel Essex, MD 09/11/14 [phone]

## 2014-09-11 LAB — PREPARE RBC (CROSSMATCH)

## 2014-09-11 MED ORDER — SODIUM CHLORIDE 0.9 % IV SOLN
Freq: Once | INTRAVENOUS | Status: AC
  Administered 2014-09-11: 03:00:00 via INTRAVENOUS

## 2014-09-11 MED ORDER — SODIUM CHLORIDE 0.9 % IV SOLN
INTRAVENOUS | Status: DC
  Administered 2014-09-11 – 2014-09-12 (×3): via INTRAVENOUS

## 2014-09-11 MED ORDER — KETOROLAC TROMETHAMINE 30 MG/ML IJ SOLN
30.0000 mg | Freq: Once | INTRAMUSCULAR | Status: AC
  Administered 2014-09-11: 30 mg via INTRAVENOUS
  Filled 2014-09-11: qty 1

## 2014-09-11 MED ORDER — HYDROMORPHONE HCL 1 MG/ML IJ SOLN
1.0000 mg | INTRAMUSCULAR | Status: DC | PRN
  Administered 2014-09-11 – 2014-09-12 (×6): 1 mg via INTRAVENOUS
  Filled 2014-09-11 (×6): qty 1

## 2014-09-11 MED ORDER — KETOROLAC TROMETHAMINE 15 MG/ML IJ SOLN
15.0000 mg | Freq: Four times a day (QID) | INTRAMUSCULAR | Status: DC | PRN
Start: 2014-09-11 — End: 2014-09-12
  Administered 2014-09-11 – 2014-09-12 (×4): 15 mg via INTRAVENOUS
  Filled 2014-09-11 (×4): qty 1

## 2014-09-11 NOTE — Progress Notes (Addendum)
[facility] PROGRESS NOTE  Jeffrey Morrison ASN:[id number] DOB: [birth year] DOA: 09/10/2014 PCP: No PCP Per Patient  Assessment/Plan: 1. Sickle cell pain crisis 1. Cont hydration 2. Cont pain meds as tolerated 3. Cont supportive care 2. Leukocytosis 1. Improving 2. Afebrile 3. Likely secondary to sickle cell 3. DVT prophylaxis 1. Lovenox  Code Status: Full Family Communication: Pt in room (indicate person spoken with, relationship, and if by phone, the number) Disposition Plan: Pending   Consultants:    Procedures:    Antibiotics:   (indicate start date, and stop date if known)  HPI/Subjective: No acute events noted. Reports, "I just want to sleep."  Objective: Filed Vitals:   09/11/14 0533 09/11/14 0735 09/11/14 0815 09/11/14 0955  BP: 99/59 111/59 117/62 116/57  Pulse: 87 77 62 62  Temp: 99.6 F (37.6 C) 99.6 F (37.6 C) 98.9 F (37.2 C) 98.7 F (37.1 C)  TempSrc: Oral Oral Oral Oral  Resp: 16 16 16 20   Height:      Weight:      SpO2: 92% 95% 96% 95%    Intake/Output Summary (Last 24 hours) at 09/11/14 1434 Last data filed at 09/11/14 0815  Gross per 24 hour  Intake     30 ml  Output    625 ml  Net   -595 ml   Filed Weights   09/10/14 1508 09/10/14 2127  Weight: 83.008 kg (183 lb) 81.92 kg (180 lb 9.6 oz)    Exam:   General:  Awake, in nad  Cardiovascular: regular, s1, s2  Respiratory: normal resp effort, no wheezing  Abdomen: soft,nondistended  Musculoskeletal: perfused, no clubbing   Data Reviewed: Basic Metabolic Panel:  Recent Labs Lab 09/10/14 1526 09/10/14 2230  NA 137 139  K 3.7 4.0  CL 107 110  CO2 23 26  GLUCOSE 106* 119*  BUN 6 9  CREATININE 1.03 1.05  CALCIUM 9.4 8.6  MG  --  2.1   Liver Function Tests:  Recent Labs Lab 09/10/14 1526 09/10/14 2230  AST 40* 29  ALT 22 17  ALKPHOS 116 95  BILITOT 2.5* 2.8*  PROT 7.3 6.3  ALBUMIN 4.0 3.4*   No results for input(s): LIPASE, AMYLASE in the last  168 hours. No results for input(s): AMMONIA in the last 168 hours. CBC:  Recent Labs Lab 09/10/14 1526 09/10/14 2230  WBC 24.8* 20.2*  NEUTROABS 19.6* 14.6*  HGB 9.7* 7.4*  HCT 28.4* 22.0*  MCV 91.0 91.7  PLT 366 290   Cardiac Enzymes: No results for input(s): CKTOTAL, CKMB, CKMBINDEX, TROPONINI in the last 168 hours. BNP (last 3 results) No results for input(s): PROBNP in the last 8760 hours. CBG: No results for input(s): GLUCAP in the last 168 hours.  No results found for this or any previous visit (from the past 240 hour(s)).   Studies: Dg Chest 2 View  09/10/2014   CLINICAL DATA:  Sickle cell crisis with pain  EXAM: CHEST  2 VIEW  COMPARISON:  08/08/2014  FINDINGS: The heart size and mediastinal contours are within normal limits. Both lungs are clear. The visualized skeletal structures are unremarkable.  IMPRESSION: No active cardiopulmonary disease.   Electronically Signed   By: Inez Catalina M.D.   On: 09/10/2014 17:00    Scheduled Meds: . enoxaparin (LOVENOX) injection  40 mg Subcutaneous Q24H  . folic acid  1 mg Oral Daily  . senna-docusate  1 tablet Oral BID  . sodium chloride  1,000 mL Intravenous Once  Continuous Infusions: . sodium chloride 125 mL/hr at 09/11/14 1036    Active Problems:   Sickle cell pain crisis   Leukocytosis  Time spent: 37min  [facility], [facility] Pager [phone]. If 7PM-7AM, please contact night-coverage at [email or web address], password [facility] 09/11/2014, 2:34 PM  LOS: 1 day

## 2014-09-12 LAB — TYPE AND SCREEN
ABO/RH(D): A POS
ANTIBODY SCREEN: POSITIVE
DAT, IgG: NEGATIVE
Unit division: 0

## 2014-09-12 MED ORDER — OXYCODONE-ACETAMINOPHEN 5-325 MG PO TABS
1.0000 | ORAL_TABLET | ORAL | Status: DC
  Administered 2014-09-12 (×2): 1 via ORAL
  Filled 2014-09-12 (×2): qty 1

## 2014-09-12 MED ORDER — OXYCODONE-ACETAMINOPHEN 5-325 MG PO TABS: 1.0000 | ORAL_TABLET | Freq: Four times a day (QID) | ORAL | Status: DC | PRN

## 2014-09-12 MED ORDER — POLYETHYLENE GLYCOL 3350 17 G PO PACK
17.0000 g | PACK | Freq: Once | ORAL | Status: AC
  Administered 2014-09-12: 17 g via ORAL
  Filled 2014-09-12: qty 1

## 2014-09-12 NOTE — Progress Notes (Signed)
Dr. Alben Deeds responded, pt is discharged.

## 2014-09-12 NOTE — Discharge Instructions (Signed)
Anemia, Nonspecific Anemia is a condition in which the concentration of red blood cells or hemoglobin in the blood is below normal. Hemoglobin is a substance in red blood cells that carries oxygen to the tissues of the body. Anemia results in not enough oxygen reaching these tissues.  CAUSES  Common causes of anemia include:   Excessive bleeding. Bleeding may be internal or external. This includes excessive bleeding from periods (in women) or from the intestine.   Poor nutrition.   Chronic kidney, thyroid, and liver disease.  Bone marrow disorders that decrease red blood cell production.  Cancer and treatments for cancer.  HIV, AIDS, and their treatments.  Spleen problems that increase red blood cell destruction.  Blood disorders.  Excess destruction of red blood cells due to infection, medicines, and autoimmune disorders. SIGNS AND SYMPTOMS   Minor weakness.   Dizziness.   Headache.  Palpitations.   Shortness of breath, especially with exercise.   Paleness.  Cold sensitivity.  Indigestion.  Nausea.  Difficulty sleeping.  Difficulty concentrating. Symptoms may occur suddenly or they may develop slowly.  DIAGNOSIS  Additional blood tests are often needed. These help your health care provider determine the best treatment. Your health care provider will check your stool for blood and look for other causes of blood loss.  TREATMENT  Treatment varies depending on the cause of the anemia. Treatment can include:   Supplements of iron, vitamin B12, or folic acid.   Hormone medicines.   A blood transfusion. This may be needed if blood loss is severe.   Hospitalization. This may be needed if there is significant continual blood loss.   Dietary changes.  Spleen removal. HOME CARE INSTRUCTIONS Keep all follow-up appointments. It often takes many weeks to correct anemia, and having your health care provider check on your condition and your response to  treatment is very important. SEEK IMMEDIATE MEDICAL CARE IF:   You develop extreme weakness, shortness of breath, or chest pain.   You become dizzy or have trouble concentrating.  You develop heavy vaginal bleeding.   You develop a rash.   You have bloody or black, tarry stools.   You faint.   You vomit up blood.   You vomit repeatedly.   You have abdominal pain.  You have a fever or persistent symptoms for more than 2-3 days.   You have a fever and your symptoms suddenly get worse.   You are dehydrated.  MAKE SURE YOU:  Understand these instructions.  Will watch your condition.  Will get help right away if you are not doing well or get worse. Document Released: 10/09/2004 Document Revised: 05/04/2013 Document Reviewed: 02/25/2013 ExitCare Patient Information 2015 [employer]. This information is not intended to replace advice given to you by your health care provider. Make sure you discuss any questions you have with your health care provider.  

## 2014-09-12 NOTE — Discharge Summary (Signed)
Physician Discharge Summary  Jeffrey Morrison RDE:[id number] DOB: [birth year] DOA: 09/10/2014  PCP: No PCP Per Patient  Admit date: 09/10/2014 Discharge date: 09/12/2014  Discharge Diagnoses:  Active Problems:   Sickle cell pain crisis   Leukocytosis   Discharge Condition: improved/stable  Disposition:      Follow-up Information    Follow up with MATTHEWS,MICHELLE A., MD. Schedule an appointment as soon as possible for a visit in 2 weeks.   Specialty:  Internal Medicine   Contact information:   [place] [address] [phone]     home  Diet:regular  Wt Readings from Last 3 Encounters:  09/12/14 181 lb 3.2 oz (82.192 kg)  08/14/14 183 lb (83.008 kg)  01/12/14 176 lb 9.4 oz (80.1 kg) (77 %*, Z = 0.75)   * Growth percentiles are based on CDC 2-20 Years data.    History of present illness:  Jeffrey Morrison is a 18 y.o. male with Sickle Cell Disease who presents with complaints of sever 10/10 pain in his left arm, /elbow, right leg and lower ADB x 2 days. He reports running out of his pain Rx, and his last PAin Rx was written by Dr. Zigmund Daniel of the [place] when he had a crisis in November. He denies having any fever or chills or chest pain or URI Sxs. He continued to have severe pain despite pain medications that were administered in the ED. He was referred for admission.  Hospital Course:  Patient presented with pain characteristic of acute vaso-occlusive crisis. Pt's pain was treated with bolus IV weight based Dilaudid and Ketorolac. He was given IV fluids.  On day of discharge, IV Dilaudid was discontinued and he was started on his home Percocet dose. His pain decreased down to a 3/10 and he remained physically functional. Pt was discharged with instructions to rest, drink plenty of fluids, and take his Percocet q6h for the first day, then as needed. Pt relayed that he did not have pain meds available to him and did not recall the last  distribution of Percocet given to him on 08/13/14 and picked up on 08/16/14 according to [place]. Due to this concern, I relayed that I will only give him a prescription for Percocet for 4 days worth, and that he needs to establish care with a PCP who can reliably write prescriptions for him under a pain management plan. He stated understanding.   Discharge Exam:  Filed Vitals:   09/12/14 1451  BP: 115/59  Pulse: 64  Temp: 99.4 F (37.4 C)  Resp: 16   Filed Vitals:   09/12/14 0235 09/12/14 0605 09/12/14 1030 09/12/14 1451  BP: 101/52 125/59 122/60 115/59  Pulse: 79 71 70 64  Temp: 98.4 F (36.9 C) 98.3 F (36.8 C) 99 F (37.2 C) 99.4 F (37.4 C)  TempSrc: Oral Oral Oral Oral  Resp: 16 16 16 16   Height:      Weight:  181 lb 3.2 oz (82.192 kg)    SpO2: 94% 94% 95% 96%   General: Alert, awake, oriented x3, in no acute distress.  HEENT: [place]/AT PEERL, EOMI Neck: Trachea midline, no masses, no thyromegal,y no JVD, no carotid bruit OROPHARYNX: Moist, No exudate/ erythema/lesions.  Heart: Regular rate and rhythm, without murmurs, rubs, gallops, PMI non-displaced, no heaves or thrills on palpation.  Lungs: Clear to auscultation, no wheezing or rhonchi noted. No increased vocal fremitus resonant to percussion  Abdomen: Soft, nontender, nondistended, positive bowel sounds, no  masses no hepatosplenomegaly noted..  Neuro: No focal neurological deficits noted cranial nerves II through XII grossly intact. Strength 5 out of 5 in bilateral upper and lower extremities. Musculoskeletal: No warm swelling or erythema around joints, no spinal tenderness noted. Psychiatric: Patient alert and oriented x3, good insight and cognition Extremities: no cyanosis/edema  Discharge Instructions  As above   Medication List    STOP taking these medications        levofloxacin 750 MG tablet  Commonly known as:  LEVAQUIN      TAKE these medications        folic acid 1 MG tablet  Commonly known  as:  FOLVITE  Take 1 tablet (1 mg total) by mouth daily.     hydroxyurea 500 MG capsule  Commonly known as:  HYDREA  Take 1 capsule (500 mg total) by mouth daily. May take with food to minimize GI side effects.     ibuprofen 400 MG tablet  Commonly known as:  ADVIL,MOTRIN  Take 400 mg by mouth every 6 (six) hours as needed for mild pain or moderate pain.     oxyCODONE-acetaminophen 5-325 MG per tablet  Commonly known as:  PERCOCET  Take 1-2 tablets by mouth every 6 (six) hours as needed.     penicillin v potassium 500 MG tablet  Commonly known as:  VEETID  Take 1 tablet (500 mg total) by mouth 3 (three) times daily.     traMADol 50 MG tablet  Commonly known as:  ULTRAM  Take 1 tablet (50 mg total) by mouth every 6 (six) hours as needed.          The results of significant diagnostics from this hospitalization (including imaging, microbiology, ancillary and laboratory) are listed below for reference.    Significant Diagnostic Studies: Dg Chest 2 View  09/10/2014   CLINICAL DATA:  Sickle cell crisis with pain  EXAM: CHEST  2 VIEW  COMPARISON:  08/08/2014  FINDINGS: The heart size and mediastinal contours are within normal limits. Both lungs are clear. The visualized skeletal structures are unremarkable.  IMPRESSION: No active cardiopulmonary disease.   Electronically Signed   By: Inez Catalina M.D.   On: 09/10/2014 17:00    Microbiology: Recent Results (from the past 240 hour(s))  Blood culture (routine x 2)     Status: None (Preliminary result)   Collection Time: 09/10/14  5:50 PM  Result Value Ref Range Status   Specimen Description BLOOD LEFT ARM  Final   Special Requests BOTTLES DRAWN AEROBIC AND ANAEROBIC 10CC EACH  Final   Culture   Final           BLOOD CULTURE RECEIVED NO GROWTH TO DATE CULTURE WILL BE HELD FOR 5 DAYS BEFORE ISSUING A FINAL NEGATIVE REPORT Note: Culture results may be compromised due to an excessive volume of blood received in culture  bottles. Performed at [facility]    Report Status PENDING  Incomplete  Blood culture (routine x 2)     Status: None (Preliminary result)   Collection Time: 09/10/14  6:00 PM  Result Value Ref Range Status   Specimen Description BLOOD LEFT HAND  Final   Special Requests BOTTLES DRAWN AEROBIC AND ANAEROBIC 10CC EACH  Final   Culture   Final           BLOOD CULTURE RECEIVED NO GROWTH TO DATE CULTURE WILL BE HELD FOR 5 DAYS BEFORE ISSUING A FINAL NEGATIVE REPORT Note: Culture results may be compromised due  to an excessive volume of blood received in culture bottles. Performed at [facility]    Report Status PENDING  Incomplete     Labs: Basic Metabolic Panel:  Recent Labs Lab 09/10/14 1526 09/10/14 2230  NA 137 139  K 3.7 4.0  CL 107 110  CO2 23 26  GLUCOSE 106* 119*  BUN 6 9  CREATININE 1.03 1.05  CALCIUM 9.4 8.6  MG  --  2.1   Liver Function Tests:  Recent Labs Lab 09/10/14 1526 09/10/14 2230  AST 40* 29  ALT 22 17  ALKPHOS 116 95  BILITOT 2.5* 2.8*  PROT 7.3 6.3  ALBUMIN 4.0 3.4*   No results for input(s): LIPASE, AMYLASE in the last 168 hours. No results for input(s): AMMONIA in the last 168 hours. CBC:  Recent Labs Lab 09/10/14 1526 09/10/14 2230  WBC 24.8* 20.2*  NEUTROABS 19.6* 14.6*  HGB 9.7* 7.4*  HCT 28.4* 22.0*  MCV 91.0 91.7  PLT 366 290    Time coordinating discharge: 45 min  Signed:  Kalman Shan  09/12/2014, 5:35 PM

## 2014-09-12 NOTE — Progress Notes (Signed)
Pt just told the nurse that his" legs hurt too bad to go home". Dr. Alben Deeds paged, awaiting response.

## 2014-09-17 LAB — CULTURE, BLOOD (ROUTINE X 2)
Culture: NO GROWTH
Culture: NO GROWTH

## 2014-10-07 ENCOUNTER — Encounter (HOSPITAL_COMMUNITY): Payer: Self-pay | Admitting: *Deleted

## 2014-10-07 ENCOUNTER — Emergency Department (HOSPITAL_COMMUNITY)
Admission: EM | Admit: 2014-10-07 | Discharge: 2014-10-07 | Disposition: A | Discharge status: Home / Self Care | Payer: Medicaid Other | Attending: Emergency Medicine | Admitting: Emergency Medicine

## 2014-10-07 DIAGNOSIS — Z792 Long term (current) use of antibiotics: Secondary | ICD-10-CM | POA: Diagnosis not present

## 2014-10-07 DIAGNOSIS — Z720 Tobacco use: Secondary | ICD-10-CM | POA: Insufficient documentation

## 2014-10-07 DIAGNOSIS — G8929 Other chronic pain: Secondary | ICD-10-CM | POA: Diagnosis not present

## 2014-10-07 DIAGNOSIS — Z79899 Other long term (current) drug therapy: Secondary | ICD-10-CM | POA: Diagnosis not present

## 2014-10-07 DIAGNOSIS — D5700 Hb-SS disease with crisis, unspecified: Secondary | ICD-10-CM | POA: Diagnosis not present

## 2014-10-07 DIAGNOSIS — M79604 Pain in right leg: Secondary | ICD-10-CM | POA: Diagnosis present

## 2014-10-07 LAB — URINALYSIS, ROUTINE W REFLEX MICROSCOPIC
Bilirubin Urine: NEGATIVE
GLUCOSE, UA: NEGATIVE mg/dL
KETONES UR: NEGATIVE mg/dL
Leukocytes, UA: NEGATIVE
Nitrite: NEGATIVE
PH: 6.5 (ref 5.0–8.0)
PROTEIN: 30 mg/dL — AB
Specific Gravity, Urine: 1.014 (ref 1.005–1.030)
UROBILINOGEN UA: 1 mg/dL (ref 0.0–1.0)

## 2014-10-07 LAB — CBC WITH DIFFERENTIAL/PLATELET
Basophils Absolute: 0.1 10*3/uL (ref 0.0–0.1)
Basophils Relative: 1 % (ref 0–1)
EOS ABS: 0.2 10*3/uL (ref 0.0–0.7)
Eosinophils Relative: 2 % (ref 0–5)
HCT: 23 % — ABNORMAL LOW (ref 39.0–52.0)
HEMOGLOBIN: 7.9 g/dL — AB (ref 13.0–17.0)
LYMPHS ABS: 3.1 10*3/uL (ref 0.7–4.0)
Lymphocytes Relative: 26 % (ref 12–46)
MCH: 31.2 pg (ref 26.0–34.0)
MCHC: 34.3 g/dL (ref 30.0–36.0)
MCV: 90.9 fL (ref 78.0–100.0)
MONOS PCT: 11 % (ref 3–12)
Monocytes Absolute: 1.3 10*3/uL — ABNORMAL HIGH (ref 0.1–1.0)
NEUTROS PCT: 60 % (ref 43–77)
Neutro Abs: 7.2 10*3/uL (ref 1.7–7.7)
Platelets: 526 10*3/uL — ABNORMAL HIGH (ref 150–400)
RBC: 2.53 MIL/uL — AB (ref 4.22–5.81)
RDW: 26.2 % — ABNORMAL HIGH (ref 11.5–15.5)
WBC: 11.9 10*3/uL — ABNORMAL HIGH (ref 4.0–10.5)

## 2014-10-07 LAB — COMPREHENSIVE METABOLIC PANEL
ALT: 15 U/L (ref 0–53)
ANION GAP: 9 (ref 5–15)
AST: 44 U/L — ABNORMAL HIGH (ref 0–37)
Albumin: 4.1 g/dL (ref 3.5–5.2)
Alkaline Phosphatase: 101 U/L (ref 39–117)
BUN: 8 mg/dL (ref 6–23)
CALCIUM: 9.2 mg/dL (ref 8.4–10.5)
CHLORIDE: 107 mmol/L (ref 96–112)
CO2: 23 mmol/L (ref 19–32)
Creatinine, Ser: 1.01 mg/dL (ref 0.50–1.35)
GFR calc Af Amer: >90 mL/min (ref >90)
GLUCOSE: 91 mg/dL (ref 70–99)
POTASSIUM: 4.4 mmol/L (ref 3.5–5.1)
SODIUM: 139 mmol/L (ref 135–145)
Total Bilirubin: 2.7 mg/dL — ABNORMAL HIGH (ref 0.3–1.2)
Total Protein: 7 g/dL (ref 6.0–8.3)

## 2014-10-07 LAB — RETICULOCYTES
RBC.: 2.53 MIL/uL — AB (ref 4.22–5.81)
Retic Ct Pct: >23 % — ABNORMAL HIGH (ref 0.4–3.1)

## 2014-10-07 LAB — URINE MICROSCOPIC-ADD ON

## 2014-10-07 MED ORDER — OXYCODONE-ACETAMINOPHEN 5-325 MG PO TABS: 1.0000 | ORAL_TABLET | ORAL | Status: DC | PRN

## 2014-10-07 MED ORDER — HYDROMORPHONE HCL 1 MG/ML IJ SOLN
1.0000 mg | Freq: Once | INTRAMUSCULAR | Status: AC
  Administered 2014-10-07: 1 mg via INTRAVENOUS
  Filled 2014-10-07: qty 1

## 2014-10-07 MED ORDER — KETOROLAC TROMETHAMINE 30 MG/ML IJ SOLN
30.0000 mg | Freq: Once | INTRAMUSCULAR | Status: AC
  Administered 2014-10-07: 30 mg via INTRAVENOUS
  Filled 2014-10-07: qty 1

## 2014-10-07 MED ORDER — DIPHENHYDRAMINE HCL 50 MG/ML IJ SOLN
25.0000 mg | Freq: Once | INTRAMUSCULAR | Status: AC
  Administered 2014-10-07: 25 mg via INTRAVENOUS
  Filled 2014-10-07: qty 1

## 2014-10-07 MED ORDER — SODIUM CHLORIDE 0.9 % IV SOLN
1000.0000 mL | Freq: Once | INTRAVENOUS | Status: AC
  Administered 2014-10-07: 1000 mL via INTRAVENOUS

## 2014-10-07 MED ORDER — SODIUM CHLORIDE 0.9 % IV SOLN: 1000.0000 mL | INTRAVENOUS | Status: DC

## 2014-10-07 NOTE — Discharge Instructions (Signed)
Sickle Cell Anemia, Adult °Sickle cell anemia is a condition in which red blood cells have an abnormal "sickle" shape. This abnormal shape shortens the cells' life span, which results in a lower than normal concentration of red blood cells in the blood. The sickle shape also causes the cells to clump together and block free blood flow through the blood vessels. As a result, the tissues and organs of the body do not receive enough oxygen. Sickle cell anemia causes organ damage and pain and increases the risk of infection. °CAUSES  °Sickle cell anemia is a genetic disorder. Those who receive two copies of the gene have the condition, and those who receive one copy have the trait. °RISK FACTORS °The sickle cell gene is most common in people whose families originated in [place]. Other areas of the globe where sickle cell trait occurs include the Mediterranean, [place] and [place], the [place], and the [place].  °SIGNS AND SYMPTOMS °· Pain, especially in the extremities, back, chest, or abdomen (common). The pain may start suddenly or may develop following an illness, especially if there is dehydration. Pain can also occur due to overexertion or exposure to extreme temperature changes. °· Frequent severe bacterial infections, especially certain types of pneumonia and meningitis. °· Pain and swelling in the hands and feet. °· Decreased activity.   °· Loss of appetite.   °· Change in behavior. °· Headaches. °· Seizures. °· Shortness of breath or difficulty breathing. °· Vision changes. °· Skin ulcers. °Those with the trait may not have symptoms or they may have mild symptoms.  °DIAGNOSIS  °Sickle cell anemia is diagnosed with blood tests that demonstrate the genetic trait. It is often diagnosed during the newborn period, due to mandatory testing nationwide. A variety of blood tests, X-rays, CT scans, MRI scans, ultrasounds, and lung function tests may also be done to monitor the condition. °TREATMENT  °Sickle  cell anemia may be treated with: °· Medicines. You may be given pain medicines, antibiotic medicines (to treat and prevent infections) or medicines to increase the production of certain types of hemoglobin. °· Fluids. °· Oxygen. °· Blood transfusions. °HOME CARE INSTRUCTIONS  °· Drink enough fluid to keep your urine clear or pale yellow. Increase your fluid intake in hot weather and during exercise. °· Do not smoke. Smoking lowers oxygen levels in the blood.   °· Only take over-the-counter or prescription medicines for pain, fever, or discomfort as directed by your health care provider. °· Take antibiotics as directed by your health care provider. Make sure you finish them it even if you start to feel better.   °· Take supplements as directed by your health care provider.   °· Consider wearing a medical alert bracelet. This tells anyone caring for you in an emergency of your condition.   °· When traveling, keep your medical information, health care provider's names, and the medicines you take with you at all times.   °· If you develop a fever, do not take medicines to reduce the fever right away. This could cover up a problem that is developing. Notify your health care provider. °· Keep all follow-up appointments with your health care provider. Sickle cell anemia requires regular medical care. °SEEK MEDICAL CARE IF: ° You have a fever. °SEEK IMMEDIATE MEDICAL CARE IF:  °· You feel dizzy or faint.   °· You have new abdominal pain, especially on the left side near the stomach area.   °· You develop a persistent, often uncomfortable and painful penile erection (priapism). If this is not treated immediately it   will lead to impotence.   You have numbness your arms or legs or you have a hard time moving them.   You have a hard time with speech.   You have a fever or persistent symptoms for more than 2-3 days.   You have a fever and your symptoms suddenly get worse.   You have signs or symptoms of infection.  These include:   Chills.   Abnormal tiredness (lethargy).   Irritability.   Poor eating.   Vomiting.   You develop pain that is not helped with medicine.   You develop shortness of breath.  You have pain in your chest.   You are coughing up pus-like or bloody sputum.   You develop a stiff neck.  Your feet or hands swell or have pain.  Your abdomen appears bloated.  You develop joint pain. MAKE SURE YOU:  Understand these instructions. Document Released: 12/10/2005 Document Revised: 01/16/2014 Document Reviewed: 04/13/2013 [facility] Patient Information 2015 [place], [place]. This information is not intended to replace advice given to you by your health care provider. Make sure you discuss any questions you have with your health care provider.  Acetaminophen; Oxycodone tablets What is this medicine? ACETAMINOPHEN; OXYCODONE (a set a MEE noe fen; ox i KOE done) is a pain reliever. It is used to treat mild to moderate pain. This medicine may be used for other purposes; ask your health care provider or pharmacist if you have questions. COMMON BRAND NAME(S): Endocet, Magnacet, Narvox, Percocet, Perloxx, Primalev, Primlev, Roxicet, Xolox What should I tell my health care provider before I take this medicine? They need to know if you have any of these conditions: -brain tumor -Crohn's disease, inflammatory bowel disease, or ulcerative colitis -drug abuse or addiction -head injury -heart or circulation problems -if you often drink alcohol -kidney disease or problems going to the bathroom -liver disease -lung disease, asthma, or breathing problems -an unusual or allergic reaction to acetaminophen, oxycodone, other opioid analgesics, other medicines, foods, dyes, or preservatives -pregnant or trying to get pregnant -breast-feeding How should I use this medicine? Take this medicine by mouth with a full glass of water. Follow the directions on the prescription label.  Take your medicine at regular intervals. Do not take your medicine more often than directed. Talk to your pediatrician regarding the use of this medicine in children. Special care may be needed. Patients over 23 years old may have a stronger reaction and need a smaller dose. Overdosage: If you think you have taken too much of this medicine contact a poison control center or emergency room at once. NOTE: This medicine is only for you. Do not share this medicine with others. What if I miss a dose? If you miss a dose, take it as soon as you can. If it is almost time for your next dose, take only that dose. Do not take double or extra doses. What may interact with this medicine? -alcohol -antihistamines -barbiturates like amobarbital, butalbital, butabarbital, methohexital, pentobarbital, phenobarbital, thiopental, and secobarbital -benztropine -drugs for bladder problems like solifenacin, trospium, oxybutynin, tolterodine, hyoscyamine, and methscopolamine -drugs for breathing problems like ipratropium and tiotropium -drugs for certain stomach or intestine problems like propantheline, homatropine methylbromide, glycopyrrolate, atropine, belladonna, and dicyclomine -general anesthetics like etomidate, ketamine, nitrous oxide, propofol, desflurane, enflurane, halothane, isoflurane, and sevoflurane -medicines for depression, anxiety, or psychotic disturbances -medicines for sleep -muscle relaxants -naltrexone -narcotic medicines (opiates) for pain -phenothiazines like perphenazine, thioridazine, chlorpromazine, mesoridazine, fluphenazine, prochlorperazine, promazine, and trifluoperazine -scopolamine -tramadol -trihexyphenidyl This  list may not describe all possible interactions. Give your health care provider a list of all the medicines, herbs, non-prescription drugs, or dietary supplements you use. Also tell them if you smoke, drink alcohol, or use illegal drugs. Some items may interact with your  medicine. What should I watch for while using this medicine? Tell your doctor or health care professional if your pain does not go away, if it gets worse, or if you have new or a different type of pain. You may develop tolerance to the medicine. Tolerance means that you will need a higher dose of the medication for pain relief. Tolerance is normal and is expected if you take this medicine for a long time. Do not suddenly stop taking your medicine because you may develop a severe reaction. Your body becomes used to the medicine. This does NOT mean you are addicted. Addiction is a behavior related to getting and using a drug for a non-medical reason. If you have pain, you have a medical reason to take pain medicine. Your doctor will tell you how much medicine to take. If your doctor wants you to stop the medicine, the dose will be slowly lowered over time to avoid any side effects. You may get drowsy or dizzy. Do not drive, use machinery, or do anything that needs mental alertness until you know how this medicine affects you. Do not stand or sit up quickly, especially if you are an older patient. This reduces the risk of dizzy or fainting spells. Alcohol may interfere with the effect of this medicine. Avoid alcoholic drinks. There are different types of narcotic medicines (opiates) for pain. If you take more than one type at the same time, you may have more side effects. Give your health care provider a list of all medicines you use. Your doctor will tell you how much medicine to take. Do not take more medicine than directed. Call emergency for help if you have problems breathing. The medicine will cause constipation. Try to have a bowel movement at least every 2 to 3 days. If you do not have a bowel movement for 3 days, call your doctor or health care professional. Do not take Tylenol (acetaminophen) or medicines that have acetaminophen with this medicine. Too much acetaminophen can be very dangerous. Many  nonprescription medicines contain acetaminophen. Always read the labels carefully to avoid taking more acetaminophen. What side effects may I notice from receiving this medicine? Side effects that you should report to your doctor or health care professional as soon as possible: -allergic reactions like skin rash, itching or hives, swelling of the face, lips, or tongue -breathing difficulties, wheezing -confusion -light headedness or fainting spells -severe stomach pain -unusually weak or tired -yellowing of the skin or the whites of the eyes Side effects that usually do not require medical attention (report to your doctor or health care professional if they continue or are bothersome): -dizziness -drowsiness -nausea -vomiting This list may not describe all possible side effects. Call your doctor for medical advice about side effects. You may report side effects to FDA at 1-800-FDA-1088. Where should I keep my medicine? Keep out of the reach of children. This medicine can be abused. Keep your medicine in a safe place to protect it from theft. Do not share this medicine with anyone. Selling or giving away this medicine is dangerous and against the law. Store at room temperature between 20 and 25 degrees C (68 and 77 degrees F). Keep container tightly closed. Protect from light.  This medicine may cause accidental overdose and death if it is taken by other adults, children, or pets. Flush any unused medicine down the toilet to reduce the chance of harm. Do not use the medicine after the expiration date. NOTE: This sheet is a summary. It may not cover all possible information. If you have questions about this medicine, talk to your doctor, pharmacist, or health care provider.  2015, [employer]. (2013-04-25 13:17:35)

## 2014-10-07 NOTE — ED Notes (Signed)
Pt arrives from home via [facility] c/o of right flank and right leg pain. 10/10 sharp pain.

## 2014-10-07 NOTE — ED Provider Notes (Signed)
CSN: [id number]     Arrival date & time 10/07/14  8546 History   First MD Initiated Contact with Patient 10/07/14 0539     Chief Complaint  Patient presents with  . Flank Pain  . Leg Pain     (Consider location/radiation/quality/duration/timing/severity/associated sxs/prior Treatment) Patient is a 19 y.o. male presenting with flank pain and leg pain. The history is provided by the patient.  Flank Pain  Leg Pain He has a history of sickle cell disease and states that he started having generalized pain about 3 hours ago. He denies fever, chills, sweats. He denies sore throat or cough. He denies vomiting or diarrhea. He denies dysuria. He rates pain at 10/10. Nothing makes it better nothing makes it worse. He states he did not have any medication at home for pain.  Past Medical History  Diagnosis Date  . Sickle cell crisis   . Chronic lower back pain    Past Surgical History  Procedure Laterality Date  . Splenectomy     History reviewed. No pertinent family history. History  Substance Use Topics  . Smoking status: Current Every Day Smoker -- 0.50 packs/day    Types: Cigarettes  . Smokeless tobacco: Not on file  . Alcohol Use: No    Review of Systems  Genitourinary: Positive for flank pain.  All other systems reviewed and are negative.     Allergies  Review of patient's allergies indicates no known allergies.  Home Medications   Prior to Admission medications   Medication Sig Start Date End Date Taking? Authorizing Provider  folic acid (FOLVITE) 1 MG tablet Take 1 tablet (1 mg total) by mouth daily. Patient not taking: Reported on 09/10/2014 03/21/14   Elmyra Ricks Pisciotta, PA-C  hydroxyurea (HYDREA) 500 MG capsule Take 1 capsule (500 mg total) by mouth daily. May take with food to minimize GI side effects. Patient not taking: Reported on 09/10/2014 03/21/14   Elmyra Ricks Pisciotta, PA-C  ibuprofen (ADVIL,MOTRIN) 400 MG tablet Take 400 mg by mouth every 6 (six) hours as needed for  mild pain or moderate pain.    Historical Provider, MD  oxyCODONE-acetaminophen (PERCOCET) 5-325 MG per tablet Take 1-2 tablets by mouth every 6 (six) hours as needed. 09/12/14   Kalman Shan, MD  penicillin v potassium (VEETID) 500 MG tablet Take 1 tablet (500 mg total) by mouth 3 (three) times daily. Patient not taking: Reported on 09/10/2014 03/21/14   Elmyra Ricks Pisciotta, PA-C  traMADol (ULTRAM) 50 MG tablet Take 1 tablet (50 mg total) by mouth every 6 (six) hours as needed. Patient not taking: Reported on 09/10/2014 08/05/14   Noland Fordyce, PA-C   BP 125/75 mmHg  Pulse 90  Temp(Src) 97.7 F (36.5 C) (Oral)  Resp 18  Ht 5\' 8"  (1.727 m)  Wt 170 lb (77.111 kg)  BMI 25.85 kg/m2  SpO2 98% Physical Exam  Nursing note and vitals reviewed.  19 year old male, who appears somewhat uncomfortable, but is in no acute distress. Vital signs are normal. Oxygen saturation is 98%, which is normal. Head is normocephalic and atraumatic. PERRLA, EOMI. Oropharynx is clear. Neck is nontender and supple without adenopathy or JVD. Back is nontender and there is no CVA tenderness. Lungs are clear without rales, wheezes, or rhonchi. Chest is nontender. Heart has regular rate and rhythm with 2-7/0 systolic ejection murmur. Abdomen is soft, flat, nontender without masses or hepatosplenomegaly and peristalsis is normoactive. Extremities have no cyanosis or edema, full range of motion is present. Skin is warm  and dry without rash. Neurologic: Mental status is normal, cranial nerves are intact, there are no motor or sensory deficits.  ED Course  Procedures (including critical care time) Labs Review Results for orders placed or performed during the hospital encounter of 10/07/14  Comprehensive metabolic panel  Result Value Ref Range   Sodium 139 135 - 145 mmol/L   Potassium 4.4 3.5 - 5.1 mmol/L   Chloride 107 96 - 112 mmol/L   CO2 23 19 - 32 mmol/L   Glucose, Bld 91 70 - 99 mg/dL   BUN 8 6 - 23 mg/dL    Creatinine, Ser 1.01 0.50 - 1.35 mg/dL   Calcium 9.2 8.4 - 10.5 mg/dL   Total Protein 7.0 6.0 - 8.3 g/dL   Albumin 4.1 3.5 - 5.2 g/dL   AST 44 (H) 0 - 37 U/L   ALT 15 0 - 53 U/L   Alkaline Phosphatase 101 39 - 117 U/L   Total Bilirubin 2.7 (H) 0.3 - 1.2 mg/dL   GFR calc non Af Amer >90 >90 mL/min   GFR calc Af Amer >90 >90 mL/min   Anion gap 9 5 - 15  CBC with Differential  Result Value Ref Range   WBC PENDING 4.0 - 10.5 K/uL   RBC 2.53 (L) 4.22 - 5.81 MIL/uL   Hemoglobin 7.9 (L) 13.0 - 17.0 g/dL   HCT 23.0 (L) 39.0 - 52.0 %   MCV 90.9 78.0 - 100.0 fL   MCH 31.2 26.0 - 34.0 pg   MCHC 34.3 30.0 - 36.0 g/dL   RDW 26.2 (H) 11.5 - 15.5 %   Platelets PENDING 150 - 400 K/uL   Neutrophils Relative % PENDING 43 - 77 %   Neutro Abs PENDING 1.7 - 7.7 K/uL   Band Neutrophils PENDING 0 - 10 %   Lymphocytes Relative PENDING 12 - 46 %   Lymphs Abs PENDING 0.7 - 4.0 K/uL   Monocytes Relative PENDING 3 - 12 %   Monocytes Absolute PENDING 0.1 - 1.0 K/uL   Eosinophils Relative PENDING 0 - 5 %   Eosinophils Absolute PENDING 0.0 - 0.7 K/uL   Basophils Relative PENDING 0 - 1 %   Basophils Absolute PENDING 0.0 - 0.1 K/uL   WBC Morphology PENDING    RBC Morphology PENDING    Smear Review PENDING    nRBC PENDING 0 /100 WBC   Metamyelocytes Relative PENDING %   Myelocytes PENDING %   Promyelocytes Absolute PENDING %   Blasts PENDING %  Reticulocytes  Result Value Ref Range   Retic Ct Pct >23.0 (H) 0.4 - 3.1 %   RBC. 2.53 (L) 4.22 - 5.81 MIL/uL   Retic Count, Manual NOT CALCULATED 19.0 - 186.0 K/uL  Urinalysis, Routine w reflex microscopic  Result Value Ref Range   Color, Urine YELLOW YELLOW   APPearance CLEAR CLEAR   Specific Gravity, Urine 1.014 1.005 - 1.030   pH 6.5 5.0 - 8.0   Glucose, UA NEGATIVE NEGATIVE mg/dL   Hgb urine dipstick TRACE (A) NEGATIVE   Bilirubin Urine NEGATIVE NEGATIVE   Ketones, ur NEGATIVE NEGATIVE mg/dL   Protein, ur 30 (A) NEGATIVE mg/dL   Urobilinogen, UA  1.0 0.0 - 1.0 mg/dL   Nitrite NEGATIVE NEGATIVE   Leukocytes, UA NEGATIVE NEGATIVE  Urine microscopic-add on  Result Value Ref Range   WBC, UA 0-2 <3 WBC/hpf   RBC / HPF 0-2 <3 RBC/hpf   MDM   Final diagnoses:  Vasoocclusive sickle cell crisis  Sickle cell vaso-occlusive crisis. Old records are reviewed and he had been admitted to the hospital 3-1/2 weeks ago and discharged with prescription for 16 tablets of oxycodone-acetaminophen. He has been admitted last 2 times he was in the ED but, prior to that, he had multiple ED visits with discharge from the ED. He is started with IV hydration is given hydromorphone, diphenhydramine, and ketorolac for pain.  6:37 AM He had moderate relief of pain with initial injection. Pain is rated at 5/10. He will be given an additional dose of hydromorphone with intention of discharging him with prescription for oxycodone-acetaminophen once adequate pain relief has been obtained.  7:24 AM Pain is almost completely relieved. He is discharged as above.  Delora Fuel, MD 98/42/10 [id number]

## 2014-11-12 ENCOUNTER — Encounter (HOSPITAL_COMMUNITY): Payer: Self-pay | Admitting: Emergency Medicine

## 2014-11-12 ENCOUNTER — Inpatient Hospital Stay (HOSPITAL_COMMUNITY)
Admission: EM | Admit: 2014-11-12 | Discharge: 2014-11-16 | DRG: 603 | Disposition: A | Discharge status: Home / Self Care | Payer: Medicaid Other | Attending: Internal Medicine | Admitting: Internal Medicine

## 2014-11-12 DIAGNOSIS — R21 Rash and other nonspecific skin eruption: Secondary | ICD-10-CM | POA: Diagnosis present

## 2014-11-12 DIAGNOSIS — D638 Anemia in other chronic diseases classified elsewhere: Secondary | ICD-10-CM | POA: Diagnosis present

## 2014-11-12 DIAGNOSIS — Z8614 Personal history of Methicillin resistant Staphylococcus aureus infection: Secondary | ICD-10-CM | POA: Diagnosis not present

## 2014-11-12 DIAGNOSIS — M7989 Other specified soft tissue disorders: Secondary | ICD-10-CM | POA: Diagnosis present

## 2014-11-12 DIAGNOSIS — D571 Sickle-cell disease without crisis: Secondary | ICD-10-CM | POA: Diagnosis present

## 2014-11-12 DIAGNOSIS — B9562 Methicillin resistant Staphylococcus aureus infection as the cause of diseases classified elsewhere: Secondary | ICD-10-CM | POA: Diagnosis present

## 2014-11-12 DIAGNOSIS — M545 Low back pain: Secondary | ICD-10-CM | POA: Diagnosis present

## 2014-11-12 DIAGNOSIS — F17210 Nicotine dependence, cigarettes, uncomplicated: Secondary | ICD-10-CM | POA: Diagnosis present

## 2014-11-12 DIAGNOSIS — B356 Tinea cruris: Secondary | ICD-10-CM | POA: Diagnosis present

## 2014-11-12 DIAGNOSIS — D649 Anemia, unspecified: Secondary | ICD-10-CM

## 2014-11-12 DIAGNOSIS — L03116 Cellulitis of left lower limb: Principal | ICD-10-CM | POA: Diagnosis present

## 2014-11-12 DIAGNOSIS — D72829 Elevated white blood cell count, unspecified: Secondary | ICD-10-CM | POA: Diagnosis present

## 2014-11-12 DIAGNOSIS — D5700 Hb-SS disease with crisis, unspecified: Secondary | ICD-10-CM

## 2014-11-12 DIAGNOSIS — L739 Follicular disorder, unspecified: Secondary | ICD-10-CM | POA: Diagnosis present

## 2014-11-12 DIAGNOSIS — M79662 Pain in left lower leg: Secondary | ICD-10-CM

## 2014-11-12 DIAGNOSIS — D5701 Hb-SS disease with acute chest syndrome: Secondary | ICD-10-CM

## 2014-11-12 DIAGNOSIS — G8929 Other chronic pain: Secondary | ICD-10-CM | POA: Diagnosis present

## 2014-11-12 LAB — BASIC METABOLIC PANEL
Anion gap: 5 (ref 5–15)
BUN: 10 mg/dL (ref 6–23)
CO2: 28 mmol/L (ref 19–32)
Calcium: 8.9 mg/dL (ref 8.4–10.5)
Chloride: 105 mmol/L (ref 96–112)
Creatinine, Ser: 1 mg/dL (ref 0.50–1.35)
Glucose, Bld: 93 mg/dL (ref 70–99)
POTASSIUM: 4.2 mmol/L (ref 3.5–5.1)
Sodium: 138 mmol/L (ref 135–145)

## 2014-11-12 LAB — CBC WITH DIFFERENTIAL/PLATELET
BASOS ABS: 0 10*3/uL (ref 0.0–0.1)
BASOS PCT: 0 % (ref 0–1)
Eosinophils Absolute: 0.2 10*3/uL (ref 0.0–0.7)
Eosinophils Relative: 1 % (ref 0–5)
HEMATOCRIT: 19 % — AB (ref 39.0–52.0)
HEMOGLOBIN: 6.4 g/dL — AB (ref 13.0–17.0)
LYMPHS ABS: 1.8 10*3/uL (ref 0.7–4.0)
Lymphocytes Relative: 10 % — ABNORMAL LOW (ref 12–46)
MCH: 30.5 pg (ref 26.0–34.0)
MCHC: 33.7 g/dL (ref 30.0–36.0)
MCV: 90.5 fL (ref 78.0–100.0)
Monocytes Absolute: 1.3 10*3/uL — ABNORMAL HIGH (ref 0.1–1.0)
Monocytes Relative: 7 % (ref 3–12)
NEUTROS ABS: 15 10*3/uL — AB (ref 1.7–7.7)
NEUTROS PCT: 82 % — AB (ref 43–77)
Platelets: 565 10*3/uL — ABNORMAL HIGH (ref 150–400)
RBC: 2.1 MIL/uL — AB (ref 4.22–5.81)
RDW: 21.9 % — ABNORMAL HIGH (ref 11.5–15.5)
WBC: 18.3 10*3/uL — ABNORMAL HIGH (ref 4.0–10.5)

## 2014-11-12 LAB — MRSA PCR SCREENING: MRSA BY PCR: POSITIVE — AB

## 2014-11-12 LAB — I-STAT CG4 LACTIC ACID, ED: Lactic Acid, Venous: 1.18 mmol/L (ref 0.5–2.0)

## 2014-11-12 MED ORDER — MORPHINE SULFATE 2 MG/ML IJ SOLN
2.0000 mg | INTRAMUSCULAR | Status: DC | PRN
  Administered 2014-11-12: 2 mg via INTRAVENOUS
  Filled 2014-11-12: qty 1

## 2014-11-12 MED ORDER — FOLIC ACID 1 MG PO TABS
1.0000 mg | ORAL_TABLET | Freq: Every day | ORAL | Status: DC
  Administered 2014-11-12 – 2014-11-16 (×5): 1 mg via ORAL
  Filled 2014-11-12 (×6): qty 1

## 2014-11-12 MED ORDER — DOCUSATE SODIUM 100 MG PO CAPS
100.0000 mg | ORAL_CAPSULE | Freq: Two times a day (BID) | ORAL | Status: DC
  Administered 2014-11-12 – 2014-11-16 (×8): 100 mg via ORAL
  Filled 2014-11-12 (×8): qty 1

## 2014-11-12 MED ORDER — OXYCODONE-ACETAMINOPHEN 5-325 MG PO TABS
1.0000 | ORAL_TABLET | ORAL | Status: DC | PRN
  Administered 2014-11-14 – 2014-11-16 (×7): 1 via ORAL
  Filled 2014-11-12 (×8): qty 1

## 2014-11-12 MED ORDER — VANCOMYCIN HCL 10 G IV SOLR
1500.0000 mg | Freq: Two times a day (BID) | INTRAVENOUS | Status: DC
  Administered 2014-11-12 – 2014-11-16 (×8): 1500 mg via INTRAVENOUS
  Filled 2014-11-12 (×9): qty 1500

## 2014-11-12 MED ORDER — VANCOMYCIN HCL IN DEXTROSE 1-5 GM/200ML-% IV SOLN: 1000.0000 mg | Freq: Once | INTRAVENOUS | Status: DC

## 2014-11-12 MED ORDER — ACETAMINOPHEN 650 MG RE SUPP: 650.0000 mg | Freq: Four times a day (QID) | RECTAL | Status: DC | PRN

## 2014-11-12 MED ORDER — SODIUM CHLORIDE 0.9 % IV SOLN
INTRAVENOUS | Status: DC
  Administered 2014-11-12: 16:00:00 via INTRAVENOUS

## 2014-11-12 MED ORDER — ONDANSETRON HCL 4 MG/2ML IJ SOLN
4.0000 mg | Freq: Four times a day (QID) | INTRAMUSCULAR | Status: DC | PRN
Start: 2014-11-12 — End: 2014-11-16

## 2014-11-12 MED ORDER — ENOXAPARIN SODIUM 40 MG/0.4ML ~~LOC~~ SOLN
40.0000 mg | SUBCUTANEOUS | Status: DC
Start: 2014-11-12 — End: 2014-11-16
  Administered 2014-11-12 – 2014-11-15 (×3): 40 mg via SUBCUTANEOUS
  Filled 2014-11-12 (×4): qty 0.4

## 2014-11-12 MED ORDER — ACETAMINOPHEN 500 MG PO TABS
1000.0000 mg | ORAL_TABLET | Freq: Once | ORAL | Status: AC
  Administered 2014-11-12: 1000 mg via ORAL
  Filled 2014-11-12: qty 2

## 2014-11-12 MED ORDER — MORPHINE SULFATE 2 MG/ML IJ SOLN
2.0000 mg | INTRAMUSCULAR | Status: DC | PRN
  Administered 2014-11-12 – 2014-11-16 (×11): 2 mg via INTRAVENOUS
  Filled 2014-11-12 (×11): qty 1

## 2014-11-12 MED ORDER — ONDANSETRON HCL 4 MG PO TABS: 4.0000 mg | ORAL_TABLET | Freq: Four times a day (QID) | ORAL | Status: DC | PRN

## 2014-11-12 MED ORDER — CLINDAMYCIN PHOSPHATE 900 MG/50ML IV SOLN
900.0000 mg | Freq: Once | INTRAVENOUS | Status: AC
  Administered 2014-11-12: 900 mg via INTRAVENOUS
  Filled 2014-11-12: qty 50

## 2014-11-12 MED ORDER — ALUM & MAG HYDROXIDE-SIMETH 200-200-20 MG/5ML PO SUSP: 30.0000 mL | Freq: Four times a day (QID) | ORAL | Status: DC | PRN

## 2014-11-12 MED ORDER — IBUPROFEN 200 MG PO TABS: 400.0000 mg | ORAL_TABLET | Freq: Four times a day (QID) | ORAL | Status: DC | PRN

## 2014-11-12 MED ORDER — NYSTATIN 100000 UNIT/GM EX CREA
TOPICAL_CREAM | Freq: Two times a day (BID) | CUTANEOUS | Status: DC
  Administered 2014-11-12 – 2014-11-13 (×3): via TOPICAL
  Administered 2014-11-14: 1 via TOPICAL
  Administered 2014-11-14 – 2014-11-16 (×4): via TOPICAL
  Filled 2014-11-12: qty 15

## 2014-11-12 MED ORDER — ACETAMINOPHEN 325 MG PO TABS: 650.0000 mg | ORAL_TABLET | Freq: Four times a day (QID) | ORAL | Status: DC | PRN

## 2014-11-12 NOTE — ED Notes (Signed)
Vascular at the bedside. 

## 2014-11-12 NOTE — Progress Notes (Signed)
Left lower extremity venous duplex completed.  Left:  No evidence of DVT, superficial thrombosis, or Baker's cyst.  Right:  Negative for DVT in the common femoral vein.  

## 2014-11-12 NOTE — Progress Notes (Signed)
ANTIBIOTIC CONSULT NOTE - INITIAL  Pharmacy Consult for Vancomycin Indication: Cellulitis  No Known Allergies  Patient Measurements: Height: 5\' 8"  (172.7 cm) Weight: 163 lb (73.936 kg) IBW/kg (Calculated) : 68.4  Vital Signs: Temp: 98.5 F (36.9 C) (02/28 1616) Temp Source: Oral (02/28 1616) BP: 99/52 mmHg (02/28 1616) Pulse Rate: 71 (02/28 1616) Intake/Output from previous day:   Intake/Output from this shift:    Labs:  Recent Labs  11/12/14 1247  WBC 18.3*  HGB 6.4*  PLT 565*  CREATININE 1.00   Estimated Creatinine Clearance: 114 mL/min (by C-G formula based on Cr of 1). No results for input(s): VANCOTROUGH, VANCOPEAK, VANCORANDOM, GENTTROUGH, GENTPEAK, GENTRANDOM, TOBRATROUGH, TOBRAPEAK, TOBRARND, AMIKACINPEAK, AMIKACINTROU, AMIKACIN in the last 72 hours.   Microbiology: No results found for this or any previous visit (from the past 720 hour(s)).  Medical History: Past Medical History  Diagnosis Date  . Sickle cell crisis   . Chronic lower back pain     Assessment: 57 YOM with sickle cell disease who presented to the [facility] with LLE cellulitis in the setting of frequent pustules. The patient recently was taken off of hydroxyurea however likely still somewhat immunocompromised and had WBC of 18.3 on admit with a drop in BP in the ED. Pharmacy consulted to start Vancomycin for empiric cellulitis coverage.   The patient received a dose of Clindamycin in the [facility].  Goal of Therapy:  Vancomycin trough level 10-15 mcg/ml  Plan:  1. Vancomycin 1500 mg IV every 12 hours 2. Will continue to follow renal function, culture results, LOT, and antibiotic de-escalation plans   Alycia Rossetti, PharmD, BCPS Clinical Pharmacist Pager: [phone] 11/12/2014 4:27 PM

## 2014-11-12 NOTE — ED Notes (Signed)
Vascular Negative.

## 2014-11-12 NOTE — ED Notes (Signed)
Pt. Stated, My left leg is swollen and I have a rash since Wednesday.

## 2014-11-12 NOTE — H&P (Signed)
[facility] History and Physical                                                                                    [place] Jeffrey Morrison, is a 19 y.o. male  MRN: [id number]   DOB - 05-20-1996  Admit Date - 11/12/2014  Outpatient Primary MD for the patient is No PCP Per Patient  Sickle Cell:  Dr. Liston Alba  With History of -  Past Medical History  Diagnosis Date  . Sickle cell crisis   . Chronic lower back pain       Past Surgical History  Procedure Laterality Date  . Splenectomy      in for   Chief Complaint  Patient presents with  . Leg Swelling  . Rash     HPI  Jeffrey Morrison  is a 19 y.o. male, with a past medical history of sickle cell disease and chronic lower back pain who presents to the emergency department with lower extremity cellulitis. Mr. Jeffrey Morrison reports that he frequently gets pustules on his buttocks and lower extremities. These irritate him so badly that he scratches them. Approximately 3 days ago his left leg began to swell. Here in the ER he states it feels tight as though it may burst. He reports being taken off of PCN, hydroxy urea and folic acid recently by Dr. Zigmund Daniel.  His last sickle cell flare was approximately 2 weeks ago.  He states he smoked weed for the pain and stayed home as he did not want to come to the hospital.  In the ER the patient's WBC is 18.3.  His left lower extremity has mild erythema and 2+ edema thru the dorsum of his foot.Marland Kitchen  He will be started on vancomycin and admitted for cellulitis.  Review of Systems   In addition to the HPI above,  No Fever-chills, No Headache, No changes with Vision or hearing, No problems swallowing food or Liquids, No Chest pain.   He currently has some congestion and cough.  He brings up yellow sputum. No Abdominal pain, No Nausea or Vomiting, Bowel movements are regular, No Blood in stool or Urine, No dysuria, No new joints pains-aches,  No new weakness, tingling, numbness in any extremity, No  recent weight gain or loss, A full 10 point Review of Systems was done, except as stated above, all other Review of Systems were negative.  Social History History  Substance Use Topics  . Smoking status: Current Every Day Smoker -- 0.50 packs/day    Types: Cigarettes  . Smokeless tobacco: Not on file  . Alcohol Use: No  +Rare Marijuana use.  Denies alcohol use.  Lives with his cousin and aunt.  Family History Mother passed with an MI prior to 82 years old.  Father has skin issues, but Jeffrey Morrison is uncertain of what they are.  Prior to Admission medications   Medication Sig Start Date End Date Taking? Authorizing Provider  folic acid (FOLVITE) 1 MG tablet Take 1 tablet (1 mg total) by mouth daily. Patient not taking: Reported on 09/10/2014 03/21/14   Elmyra Ricks Pisciotta, PA-C  hydroxyurea (HYDREA) 500 MG capsule Take 1 capsule (500 mg total) by  mouth daily. May take with food to minimize GI side effects. Patient not taking: Reported on 09/10/2014 03/21/14   Elmyra Ricks Pisciotta, PA-C  ibuprofen (ADVIL,MOTRIN) 400 MG tablet Take 400 mg by mouth every 6 (six) hours as needed for mild pain or moderate pain.    Historical Provider, MD  oxyCODONE-acetaminophen (PERCOCET) 5-325 MG per tablet Take 1 tablet by mouth every 4 (four) hours as needed for moderate pain. 3/97/67   Delora Fuel, MD  penicillin v potassium (VEETID) 500 MG tablet Take 1 tablet (500 mg total) by mouth 3 (three) times daily. Patient not taking: Reported on 09/10/2014 03/21/14   Elmyra Ricks Pisciotta, PA-C  traMADol (ULTRAM) 50 MG tablet Take 1 tablet (50 mg total) by mouth every 6 (six) hours as needed. Patient not taking: Reported on 09/10/2014 08/05/14   Noland Fordyce, PA-C    No Known Allergies  Physical Exam  Vitals  Blood pressure 110/55, pulse 87, temperature 99.3 F (37.4 C), temperature source Oral, resp. rate 16, height 5\' 8"  (1.727 m), weight 73.936 kg (163 lb), SpO2 100 %.   General: Wd, thin, AA male.  lying in bed in NAD,  polite and cooperative.  Psych:  Normal affect and insight, Not Suicidal or Homicidal, Awake Alert, Oriented X 3.  Neuro:   No F.N deficits, ALL C.Nerves Intact, Strength 5/5 all 4 extremities, Sensation intact all 4 extremities.  ENT:  Ears and Eyes appear Normal, Conjunctivae clear, PER. Moist oral mucosa without erythema or exudates.  Neck:  Supple, No lymphadenopathy appreciated  Respiratory:  Symmetrical chest wall movement, Good air movement bilaterally, CTAB.  Cardiac:  RRR, No Murmurs, no LE edema noted, no JVD.    Abdomen:  Positive bowel sounds, Soft, Non tender, Non distended,  No masses appreciated  Skin:  Pustules on his buttocks, hips and lower extremities. L>R.  LLE has 2+ edema thru the dorsum of his foot. Area of erythema in his left groin.  Extremities:  Able to move all 4. 5/5 strength in each  Data Review  Left lower extremity venous duplex completed.  Left: No evidence of DVT, superficial thrombosis, or Baker's cyst. Right: Negative for DVT in the common femoral vein.  CBC  Recent Labs Lab 11/12/14 1247  WBC 18.3*  HGB 6.4*  HCT 19.0*  PLT 565*  MCV 90.5  MCH 30.5  MCHC 33.7  RDW 21.9*  LYMPHSABS 1.8  MONOABS 1.3*  EOSABS 0.2  BASOSABS 0.0    Chemistries   Recent Labs Lab 11/12/14 1247  NA 138  K 4.2  CL 105  CO2 28  GLUCOSE 93  BUN 10  CREATININE 1.00  CALCIUM 8.9    Imaging results:   No results found.   Assessment & Plan  Principal Problem:   Cellulitis Active Problems:   Leukocytosis   Anemia of chronic disease   Acute folliculitis   Tinea cruris   LLE Cellulitis Likely secondary to folliculitis.  Hx of MRSA in the past. Given WBC of 18 and low grade fever will start on IV Vanc.   Blood cultures were drawn in the ED.  Check HIV.  Clinda given in  ED. LE Doppler negative. WOC for Cellulitis, folliculitis, and tinea cruris Will continue PRN percocet and add PRN morphine for severe pain.  Sickle Cell  Anemia. Baseline Hgb appears to be approx 7.0.  Currently at 6.4, but not complaining of sickle cell pain. Will hold off on transfusion for now.  Please reassess.  Give gentle IVF, PRN oxygen,  Folic Acid.  Tinea Cruris Nystatin cream to groin.  Folliculitis WOC recommends.  Consider dermatology referral at d/c.    DVT Prophylaxis: lovenox  AM Labs Ordered, also please review Full Orders  Family Communication:   Patient is alert, orientated, and understands his plan of care.  Code Status:  full  Condition:  Stable.  Time spent in minutes : [address]  PA-C on 11/12/2014 at 3:10 PM  Between 7am to 7pm - Pager - [phone]  After 7pm go to [email or web address] - password [email or web address]  And look for the night coverage person covering me after hours  [facility]

## 2014-11-12 NOTE — ED Provider Notes (Signed)
CSN: [id number]     Arrival date & time 11/12/14  1048 History   First MD Initiated Contact with Patient 11/12/14 1155     Chief Complaint  Patient presents with  . Leg Swelling  . Rash      HPI Pt was seen at 1200. Per pt, c/o gradual onset and worsening of persistent LLE "pain" and "swelling" for the past 3 days. Has been associated with "rash." Pt describes the rash as "little zits" that he has been "scratching at" and now have developed into shallow ulcers. Denies intra-dermal or intravenous injections to bilat LE's. Denies fevers, no injury, no focal motor weakness, no tingling/numbness in extremities.    Past Medical History  Diagnosis Date  . Sickle cell crisis   . Chronic lower back pain    Past Surgical History  Procedure Laterality Date  . Splenectomy      History  Substance Use Topics  . Smoking status: Current Every Day Smoker -- 0.50 packs/day    Types: Cigarettes  . Smokeless tobacco: Not on file  . Alcohol Use: No    Review of Systems ROS: Statement: All systems negative except as marked or noted in the HPI; Constitutional: Negative for fever and chills. ; ; Eyes: Negative for eye pain, redness and discharge. ; ; ENMT: Negative for ear pain, hoarseness, nasal congestion, sinus pressure and sore throat. ; ; Cardiovascular: Negative for chest pain, palpitations, diaphoresis, dyspnea and peripheral edema. ; ; Respiratory: Negative for cough, wheezing and stridor. ; ; Gastrointestinal: Negative for nausea, vomiting, diarrhea, abdominal pain, blood in stool, hematemesis, jaundice and rectal bleeding. . ; ; Genitourinary: Negative for dysuria, flank pain and hematuria. ; ; Musculoskeletal: +LLE pain and swelling. Negative for back pain and neck pain. Negative for trauma.; ; Skin: Negative for pruritus, abrasions, blisters, bruising and +skin lesion.; ; Neuro: Negative for headache, lightheadedness and neck stiffness. Negative for weakness, altered level of consciousness ,  altered mental status, extremity weakness, paresthesias, involuntary movement, seizure and syncope.      Allergies  Review of patient's allergies indicates no known allergies.  Home Medications   Prior to Admission medications   Medication Sig Start Date End Date Taking? Authorizing Provider  folic acid (FOLVITE) 1 MG tablet Take 1 tablet (1 mg total) by mouth daily. Patient not taking: Reported on 09/10/2014 03/21/14   Elmyra Ricks Pisciotta, PA-C  hydroxyurea (HYDREA) 500 MG capsule Take 1 capsule (500 mg total) by mouth daily. May take with food to minimize GI side effects. Patient not taking: Reported on 09/10/2014 03/21/14   Elmyra Ricks Pisciotta, PA-C  ibuprofen (ADVIL,MOTRIN) 400 MG tablet Take 400 mg by mouth every 6 (six) hours as needed for mild pain or moderate pain.    Historical Provider, MD  oxyCODONE-acetaminophen (PERCOCET) 5-325 MG per tablet Take 1 tablet by mouth every 4 (four) hours as needed for moderate pain. 0/10/27   Delora Fuel, MD  penicillin v potassium (VEETID) 500 MG tablet Take 1 tablet (500 mg total) by mouth 3 (three) times daily. Patient not taking: Reported on 09/10/2014 03/21/14   Elmyra Ricks Pisciotta, PA-C  traMADol (ULTRAM) 50 MG tablet Take 1 tablet (50 mg total) by mouth every 6 (six) hours as needed. Patient not taking: Reported on 09/10/2014 08/05/14   Noland Fordyce, PA-C   BP 99/52 mmHg  Pulse 83  Temp(Src) 99.2 F (37.3 C) (Oral)  Resp 16  Ht 5\' 8"  (1.727 m)  Wt 163 lb (73.936 kg)  BMI 24.79 kg/m2  SpO2  98% Physical Exam  1205; Physical examination:  Nursing notes reviewed; Vital signs and O2 SAT reviewed;  Constitutional: Well developed, Well nourished, Well hydrated, In no acute distress; Head:  Normocephalic, atraumatic; Eyes: EOMI, PERRL, No scleral icterus; ENMT: Mouth and pharynx normal, Mucous membranes moist; Neck: Supple, Full range of motion, No lymphadenopathy; Cardiovascular: Regular rate and rhythm, No murmur, rub, or gallop; Respiratory: Breath  sounds clear & equal bilaterally, No rales, rhonchi, wheezes.  Speaking full sentences with ease, Normal respiratory effort/excursion; Chest: Nontender, Movement normal; Abdomen: Soft, Nontender, Nondistended, Normal bowel sounds; Genitourinary: No CVA tenderness; Extremities: Pulses normal, No deformity. NT left knee/ankle. +2 LLE edema from foot to knee with calf asymmetry. Multiple scattered pustules and shallow ulcerations to LLE>RLE, no drainage, no ecchymosis, no bleeding. No large palp abscess.; Neuro: AA&Ox3, Major CN grossly intact.  Speech clear. No gross focal motor or sensory deficits in extremities.; Skin: Color normal, Warm, Dry.; Psych:  Affect flat, poor eye contact.   ED Course  Procedures     EKG Interpretation None      MDM  MDM Reviewed: previous chart, nursing note and vitals Reviewed previous: labs Interpretation: labs and ultrasound      Results for orders placed or performed during the hospital encounter of 61/60/73  Basic metabolic panel  Result Value Ref Range   Sodium 138 135 - 145 mmol/L   Potassium 4.2 3.5 - 5.1 mmol/L   Chloride 105 96 - 112 mmol/L   CO2 28 19 - 32 mmol/L   Glucose, Bld 93 70 - 99 mg/dL   BUN 10 6 - 23 mg/dL   Creatinine, Ser 1.00 0.50 - 1.35 mg/dL   Calcium 8.9 8.4 - 10.5 mg/dL   GFR calc non Af Amer >90 >90 mL/min   GFR calc Af Amer >90 >90 mL/min   Anion gap 5 5 - 15  CBC with Differential  Result Value Ref Range   WBC 18.3 (H) 4.0 - 10.5 K/uL   RBC 2.10 (L) 4.22 - 5.81 MIL/uL   Hemoglobin 6.4 (LL) 13.0 - 17.0 g/dL   HCT 19.0 (L) 39.0 - 52.0 %   MCV 90.5 78.0 - 100.0 fL   MCH 30.5 26.0 - 34.0 pg   MCHC 33.7 30.0 - 36.0 g/dL   RDW 21.9 (H) 11.5 - 15.5 %   Platelets 565 (H) 150 - 400 K/uL   Neutrophils Relative % 82 (H) 43 - 77 %   Lymphocytes Relative 10 (L) 12 - 46 %   Monocytes Relative 7 3 - 12 %   Eosinophils Relative 1 0 - 5 %   Basophils Relative 0 0 - 1 %   Neutro Abs 15.0 (H) 1.7 - 7.7 K/uL   Lymphs Abs 1.8  0.7 - 4.0 K/uL   Monocytes Absolute 1.3 (H) 0.1 - 1.0 K/uL   Eosinophils Absolute 0.2 0.0 - 0.7 K/uL   Basophils Absolute 0.0 0.0 - 0.1 K/uL   RBC Morphology MARKED POLYCHROMASIA   I-Stat CG4 Lactic Acid, ED  Result Value Ref Range   Lactic Acid, Venous 1.18 0.5 - 2.0 mmol/L   Author: Charlaine Dalton, RVT Service: Vascular Lab Author Type: Cardiovascular Sonographer    Filed: 11/12/2014 12:56 PM Note Time: 11/12/2014 12:55 PM Status: Signed   Editor: Charlaine Dalton, RVT (Cardiovascular Sonographer)     Expand All Collapse All   Left lower extremity venous duplex completed. Left: No evidence of DVT, superficial thrombosis, or Baker's cyst. Right: Negative for DVT in  the common femoral vein.        1420:  Baseline Hgb appears to be around 7. Will T&S but hold transfusion at this time. WBC count elevated with folliculitis rash on bilat LE's with multiple open shallow ulcers; IV clindamycin started. Dx and testing d/w pt.   Questions answered.  Verb understanding, agreeable to admit.  T/C to [facility], case discussed, including:  HPI, pertinent PM/SHx, VS/PE, dx testing, ED course and treatment:  Agreeable to admit, requests to write temporary orders, obtain observation medical bed to team [facility].     Francine Graven, DO 11/13/14 1035

## 2014-11-12 NOTE — ED Notes (Signed)
MD at the bedside  

## 2014-11-13 DIAGNOSIS — D638 Anemia in other chronic diseases classified elsewhere: Secondary | ICD-10-CM

## 2014-11-13 DIAGNOSIS — D72829 Elevated white blood cell count, unspecified: Secondary | ICD-10-CM

## 2014-11-13 LAB — PREPARE RBC (CROSSMATCH)

## 2014-11-13 LAB — CBC
HCT: 16.7 % — ABNORMAL LOW (ref 39.0–52.0)
HEMOGLOBIN: 5.7 g/dL — AB (ref 13.0–17.0)
MCH: 30.3 pg (ref 26.0–34.0)
MCHC: 34.1 g/dL (ref 30.0–36.0)
MCV: 88.8 fL (ref 78.0–100.0)
Platelets: 535 10*3/uL — ABNORMAL HIGH (ref 150–400)
RBC: 1.88 MIL/uL — ABNORMAL LOW (ref 4.22–5.81)
RDW: 22.6 % — ABNORMAL HIGH (ref 11.5–15.5)
WBC: 14.3 10*3/uL — AB (ref 4.0–10.5)

## 2014-11-13 LAB — HIV ANTIBODY (ROUTINE TESTING W REFLEX): HIV SCREEN 4TH GENERATION: NONREACTIVE

## 2014-11-13 MED ORDER — INFLUENZA VAC SPLIT QUAD 0.5 ML IM SUSY
0.5000 mL | PREFILLED_SYRINGE | INTRAMUSCULAR | Status: AC
  Administered 2014-11-15: 0.5 mL via INTRAMUSCULAR
  Filled 2014-11-13: qty 0.5

## 2014-11-13 MED ORDER — SODIUM CHLORIDE 0.9 % IV SOLN
Freq: Once | INTRAVENOUS | Status: AC
  Administered 2014-11-13: 17:00:00 via INTRAVENOUS

## 2014-11-13 MED ORDER — DIPHENHYDRAMINE HCL 25 MG PO CAPS
25.0000 mg | ORAL_CAPSULE | Freq: Four times a day (QID) | ORAL | Status: DC | PRN
Start: 2014-11-13 — End: 2014-11-16

## 2014-11-13 MED ORDER — MUPIROCIN 2 % EX OINT
1.0000 | TOPICAL_OINTMENT | Freq: Two times a day (BID) | CUTANEOUS | Status: DC
Start: 2014-11-13 — End: 2014-11-16
  Administered 2014-11-13 – 2014-11-16 (×7): 1 via NASAL
  Filled 2014-11-13 (×2): qty 22

## 2014-11-13 MED ORDER — CHLORHEXIDINE GLUCONATE CLOTH 2 % EX PADS
6.0000 | MEDICATED_PAD | Freq: Every day | CUTANEOUS | Status: DC
  Administered 2014-11-13 – 2014-11-16 (×4): 6 via TOPICAL

## 2014-11-13 NOTE — Progress Notes (Signed)
Patient ID: Jeffrey Morrison  male  LDJ:[id number]    DOB: June 18, 1996    DOA: 11/12/2014  PCP: No PCP Per Patient  Brief history of present illness  Jeffrey Morrison is a 19 y.o. male, with a past medical history of sickle cell disease and chronic lower back pain who presents to the emergency department with lower extremity cellulitis. Jeffrey Morrison reports that he frequently gets pustules on his buttocks and lower extremities. These irritate him so badly that he scratches them. Approximately 3 days ago his left leg began to swell. Here in the ER, he felt a tight. He reported being taken off of PCN, hydroxy urea and folic acid recently by Jeffrey Morrison. His last sickle cell flare was approximately 2 weeks ago. He reported that he smoked weed for the pain and stayed home as he did not want to come to the hospital. In the ER the patient's WBC is 18.3. His left lower extremity has mild erythema and 2+ edema thru the dorsum of his foot.   Assessment/Plan: Principal Problem: Left lower extremity  Cellulitis - Follow blood cultures, HIV negative, leukocytosis trending down - Continue IV vancomycin, patient needs wound care, consult placed - He will likely benefit from ID follow-up and Bactrim/doxycycline oral  Active Problems:   Leukocytosis - Trending down, likely due to #1    Acute on chronic Anemia of chronic disease due to sickle cell disease - Patient has history of sickle cell disease, baseline hemoglobin ~7 - Hemoglobin today 5.7, ordered 1 unit of packed RBC transfusion    Acute folliculitis - Continue wound care, will benefit from Bactrim/doxycycline upon discharge, dermatology referral    Tinea cruris Continue nystatin cream to the groin   DVT Prophylaxis: Lovenox   Code Status: full code   Family Communication:  Disposition:  Consultants:  none  Procedures:  None   Antibiotics:  IV vanc    Subjective: Patient seen and examined, denies any pain, was still has  significant itching on his legs, cellulitis and pustules on the left lower extremity   Objective  Weight change:   Intake/Output Summary (Last 24 hours) at 11/13/14 1235 Last data filed at 11/12/14 2300  Gross per 24 hour  Intake    240 ml  Output      0 ml  Net    240 ml   Blood pressure 94/45, pulse 75, temperature 98.6 F (37 C), temperature source Oral, resp. rate 20, height 5\' 8"  (1.727 m), weight 73.936 kg (163 lb), SpO2 100 %.  Physical Exam: General: Alert and awake, oriented x3, not in any acute distress. HEENT: anicteric sclera, PERLA, EOMI CVS: S1-S2 clear, no murmur rubs or gallops Chest: clear to auscultation bilaterally, no wheezing, rales or rhonchi Abdomen: soft nontender, nondistended, normal bowel sounds  Extremities: no cyanosis, clubbing. Pustules on his buttocks, hips and the lower extremity left more than right  Neuro: Cranial nerves II-XII intact, no focal neurological deficits  Lab Results: Basic Metabolic Panel:  Recent Labs Lab 11/12/14 1247  NA 138  K 4.2  CL 105  CO2 28  GLUCOSE 93  BUN 10  CREATININE 1.00  CALCIUM 8.9   Liver Function Tests: No results for input(s): AST, ALT, ALKPHOS, BILITOT, PROT, ALBUMIN in the last 168 hours. No results for input(s): LIPASE, AMYLASE in the last 168 hours. No results for input(s): AMMONIA in the last 168 hours. CBC:  Recent Labs Lab 11/12/14 1247 11/13/14 0700  WBC 18.3* 14.3*  NEUTROABS 15.0*  --  HGB 6.4* 5.7*  HCT 19.0* 16.7*  MCV 90.5 88.8  PLT 565* 535*   Cardiac Enzymes: No results for input(s): CKTOTAL, CKMB, CKMBINDEX, TROPONINI in the last 168 hours. BNP: Invalid input(s): POCBNP CBG: No results for input(s): GLUCAP in the last 168 hours.   Micro Results: Recent Results (from the past 240 hour(s))  MRSA PCR Screening     Status: Abnormal   Collection Time: 11/12/14  4:17 PM  Result Value Ref Range Status   MRSA by PCR POSITIVE (A) NEGATIVE Final    Comment:        The  GeneXpert MRSA Assay (FDA approved for NASAL specimens only), is one component of a comprehensive MRSA colonization surveillance program. It is not intended to diagnose MRSA infection nor to guide or monitor treatment for MRSA infections. RESULT CALLED TO, READ BACK BY AND VERIFIED WITH: RINANEO,Y RN [id number] SKEEN,P     Studies/Results: No results found.  Medications: Scheduled Meds: . Chlorhexidine Gluconate Cloth  6 each Topical Q0600  . docusate sodium  100 mg Oral BID  . enoxaparin (LOVENOX) injection  40 mg Subcutaneous Q24H  . folic acid  1 mg Oral Daily  . [START ON 11/14/2014] Influenza vac split quadrivalent PF  0.5 mL Intramuscular Tomorrow-1000  . mupirocin ointment  1 application Nasal BID  . nystatin cream   Topical BID  . vancomycin  1,500 mg Intravenous Q12H   Time spent 25 minutes     LOS: 1 day   Jeffrey Morrison M.D. [facility] 11/13/2014, 12:35 PM Pager: [phone]  If 7PM-7AM, please contact night-coverage [email or web address] Password [email or web address]

## 2014-11-13 NOTE — Progress Notes (Signed)
Utilization review completed.  

## 2014-11-13 NOTE — Consult Note (Signed)
WOC wound consult note Reason for Consult:  Evaluation of LE cellulitis/folliculitis  Wound type: unclear etiology, may be sickle cell ulcerations or beginning of that process.  Would recommend follow up with sickle clinic for these ulcers at the time of DC Measurement: multiple lesions on the bilateral LEs and hip/buttocks mostly under 0.5cm x 0.5cm x 0.1cm or not open at all  Wound bed: some have ruptured and have serous crust, some still with pus.  All are very tender to the touch  Drainage (amount, consistency, odor) some drainage noted on the sheets but no active drainage at the time of my assessment, fluctuant pus filled lesions on the left and right legs   Periwound: intact  Dressing procedure/placement/frequency: paint areas with betadine to dry and for the antibacterial properties. All to air dry.  No other topical care at this time.   Discussed POC with patient and bedside nurse.  Re consult if needed, will not follow at this time. Thanks  Alyah Boehning [employer], [place] [phone])

## 2014-11-14 LAB — HEMOGLOBIN AND HEMATOCRIT, BLOOD
HCT: 18.1 % — ABNORMAL LOW (ref 39.0–52.0)
Hemoglobin: 6 g/dL — CL (ref 13.0–17.0)

## 2014-11-14 LAB — PREPARE RBC (CROSSMATCH)

## 2014-11-14 MED ORDER — FUROSEMIDE 10 MG/ML IJ SOLN
20.0000 mg | Freq: Once | INTRAMUSCULAR | Status: AC
  Administered 2014-11-14: 20 mg via INTRAVENOUS
  Filled 2014-11-14: qty 2

## 2014-11-14 MED ORDER — SODIUM CHLORIDE 0.9 % IV SOLN: Freq: Once | INTRAVENOUS | Status: DC

## 2014-11-14 NOTE — Progress Notes (Signed)
Patient ID: Jeffrey Morrison  male  [id number]:[id number]    DOB: [birth year]    DOA: 11/12/2014  PCP: No PCP Per Patient  Brief history of present illness  Jeffrey Morrison is a 19 y.o. male, with a past medical history of sickle cell disease and chronic lower back pain who presents to the emergency department with lower extremity cellulitis. Mr. Aguiniga reports that he frequently gets pustules on his buttocks and lower extremities. These irritate him so badly that he scratches them. Approximately 3 days ago his left leg began to swell. Here in the ER, he felt a tight. He reported being taken off of PCN, hydroxy urea and folic acid recently by Dr. Zigmund Daniel. His last sickle cell flare was approximately 2 weeks ago. He reported that he smoked weed for the pain and stayed home as he did not want to come to the hospital. In the ER the patient's WBC is 18.3. His left lower extremity has mild erythema and 2+ edema thru the dorsum of his foot.   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 Will need 1 more unit to keep hemoglobin around 7, patient's baseline    Acute folliculitis - Continue wound care, will benefit from Bactrim/doxycycline upon discharge, dermatology referral    Tinea cruris Continue nystatin cream to the groin   DVT Prophylaxis: Lovenox   Code Status: full code    Family Communication:  Disposition:  Consultants:  none  Procedures:  None   Antibiotics:  IV vanc    Subjective: Patient seen and examined, stated left leg still feels tight, no fevers or chills  Objective  Weight change:   Intake/Output Summary (Last 24 hours) at 11/14/14 1101 Last data filed at 11/14/14 0700  Gross per 24 hour  Intake   2775 ml  Output      0 ml  Net   2775 ml   Blood pressure 102/50, pulse 61, temperature 98 F (36.7 C), temperature source Oral, resp. rate 18, height 5\' 8"  (1.727 m), weight 73.936 kg (163 lb), SpO2 100 %.  Physical Exam: General: Alert and awake, oriented x3, not in any acute distress. CVS: S1-S2 clear, no murmur rubs or gallops Chest: clear to auscultation bilaterally, no wheezing, rales or rhonchi Abdomen: soft nontender, nondistended, normal bowel sounds  Extremities: no cyanosis, clubbing. Pustules on his buttocks, hips and the lower extremity left more than right  Left leg edema compared to right  Lab Results: Basic Metabolic Panel:  Recent Labs Lab 11/12/14 1247  NA 138  K 4.2  CL 105  CO2 28  GLUCOSE 93  BUN 10  CREATININE 1.00  CALCIUM 8.9   Liver Function Tests: No results for input(s): AST, ALT, ALKPHOS, BILITOT, PROT, ALBUMIN in the last 168 hours. No results for input(s): LIPASE, AMYLASE in the last 168 hours. No results for input(s): AMMONIA in the last 168 hours. CBC:  Recent Labs Lab 11/12/14 1247 11/13/14 0700 11/14/14 0710  WBC 18.3* 14.3*  --   NEUTROABS 15.0*  --   --   HGB 6.4* 5.7* 6.0*  HCT 19.0* 16.7* 18.1*  MCV 90.5 88.8  --   PLT 565* 535*  --    Cardiac Enzymes: No results for input(s): CKTOTAL, CKMB, CKMBINDEX, TROPONINI in the last 168 hours. BNP: Invalid input(s): POCBNP CBG: No results for input(s): GLUCAP in the last 168 hours.   Micro Results: Recent Results (from the past 240 hour(s))  MRSA PCR Screening     Status: Abnormal   Collection Time: 11/12/14  4:17  PM  Result Value Ref Range Status   MRSA by PCR POSITIVE (A) NEGATIVE Final    Comment:        The GeneXpert MRSA Assay (FDA approved for NASAL specimens only), is one component of a comprehensive MRSA colonization surveillance program. It is not intended to diagnose MRSA infection nor to guide or monitor treatment for MRSA infections. RESULT CALLED TO, READ BACK BY AND VERIFIED WITH: RINANEO,Y RN [phone] SKEEN,P     Studies/Results: No results found.  Medications: Scheduled Meds: . Chlorhexidine Gluconate Cloth  6 each Topical Q0600  . docusate sodium  100 mg Oral BID  . enoxaparin (LOVENOX) injection  40 mg Subcutaneous Q24H  . folic acid  1 mg Oral Daily  . Influenza vac split quadrivalent PF  0.5 mL Intramuscular Tomorrow-1000  . mupirocin ointment  1 application Nasal BID  . nystatin cream   Topical BID  . vancomycin  1,500 mg Intravenous Q12H   Time spent 25 minutes     LOS: 2 days   Sophia Sperry M.D. [facility] 11/14/2014, 11:01 AM Pager: [phone]  If 7PM-7AM, please contact night-coverage [email or web address] Password [email or web address]

## 2014-11-15 DIAGNOSIS — D7289 Other specified disorders of white blood cells: Secondary | ICD-10-CM

## 2014-11-15 DIAGNOSIS — L0293 Carbuncle, unspecified: Secondary | ICD-10-CM

## 2014-11-15 DIAGNOSIS — L0390 Cellulitis, unspecified: Secondary | ICD-10-CM

## 2014-11-15 LAB — TYPE AND SCREEN
ABO/RH(D): A POS
Antibody Screen: POSITIVE
DAT, IgG: NEGATIVE
Unit division: 0
Unit division: 0

## 2014-11-15 LAB — CBC
HCT: 22.6 % — ABNORMAL LOW (ref 39.0–52.0)
Hemoglobin: 7.5 g/dL — ABNORMAL LOW (ref 13.0–17.0)
MCH: 29 pg (ref 26.0–34.0)
MCHC: 33.2 g/dL (ref 30.0–36.0)
MCV: 87.3 fL (ref 78.0–100.0)
Platelets: 596 10*3/uL — ABNORMAL HIGH (ref 150–400)
RBC: 2.59 MIL/uL — AB (ref 4.22–5.81)
RDW: 20.8 % — ABNORMAL HIGH (ref 11.5–15.5)
WBC: 8.5 10*3/uL (ref 4.0–10.5)

## 2014-11-15 NOTE — Progress Notes (Signed)
Tylene Fantasia NP notified of BP 98/54 manually and HR 46 after 1 unit blood @ 1:30 am. No orders received. Will continue to monitor.   HR continues to range from 40-50s this am. BP 95/44.   Raquel James 11/15/2014 6:03 AM

## 2014-11-15 NOTE — Progress Notes (Signed)
Patient ID: INFANT ZINK  male  [id number]    DOB: 12-01-1995    DOA: 11/12/2014  PCP: No PCP Per Patient  Brief history of present illness  Jeffrey Morrison is a 19 y.o. male, with a past medical history of sickle cell disease and chronic lower back pain who presents to the emergency department with lower extremity cellulitis. Jeffrey Morrison reports that he frequently gets pustules on his buttocks and lower extremities. These irritate him so badly that he scratches them. Approximately 3 days ago his left leg began to swell. Here in the ER, he felt a tight. He reported being taken off of PCN, hydroxy urea and folic acid recently by Jeffrey Morrison. His last sickle cell flare was approximately 2 weeks ago. He reported that he smoked weed for the pain and stayed home as he did not want to come to the hospital. In the ER the patient's WBC is 18.3. His left lower extremity has mild erythema and 2+ edema thru the dorsum of his foot.   Assessment/Plan: Principal Problem: Left lower extremity Cellulitis with weeping pustules from the left leg - Blood cultures negative so far, HIV negative, leukocytosis trending down - Continue IV vancomycin, patient needs wound care, consult placed - He will likely benefit from ID follow-up and Bactrim/doxycycline oral, discussed with Jeffrey Morrison, Will see patient - Venous Dopplers negative for DVT   Active Problems:   Leukocytosis - Resolved, likely due to #1     Acute on chronic Anemia of chronic disease due to sickle cell disease - Patient has history of sickle cell disease, baseline hemoglobin ~7 - Hemoglobin 7.5, status post 2 units of packed RBC transfusion, currently at baseline.     Acute folliculitis - Continue wound care, will benefit from Bactrim/doxycycline upon discharge, dermatology referral, ID follow-up    Tinea cruris Continue nystatin cream to the groin   DVT Prophylaxis: Lovenox   Code Status: full code   Family  Communication:  Disposition: Possibly tomorrow, awaiting ID follow-up  Consultants:  none  Procedures:  None   Antibiotics:  IV vanc    Subjective: Left leg feels better, still has some pain and weeping pustules, no fevers or chills  Objective  Weight change:   Intake/Output Summary (Last 24 hours) at 11/15/14 1159 Last data filed at 11/15/14 0900  Gross per 24 hour  Intake   2395 ml  Output      0 ml  Net   2395 ml   Blood pressure 95/44, pulse 55, temperature 98.2 F (36.8 C), temperature source Oral, resp. rate 16, height 5\' 8"  (1.727 m), weight 73.936 kg (163 lb), SpO2 100 %.  Physical Exam: General: Alert and awake, oriented x3, not in any acute distress. CVS: S1-S2 clear Chest: CTAB Abdomen: soft NT, ND, NBS  Extremities: no cyanosis, clubbing. Pustules on his buttocks, hips and the lower extremity left more than right  Left leg edema compared to right much improved today after Lasix was  Lab Results: Basic Metabolic Panel:  Recent Labs Lab 11/12/14 1247  NA 138  K 4.2  CL 105  CO2 28  GLUCOSE 93  BUN 10  CREATININE 1.00  CALCIUM 8.9   Liver Function Tests: No results for input(s): AST, ALT, ALKPHOS, BILITOT, PROT, ALBUMIN in the last 168 hours. No results for input(s): LIPASE, AMYLASE in the last 168 hours. No results for input(s): AMMONIA in the last 168 hours. CBC:  Recent Labs Lab 11/12/14 1247 11/13/14 0700 11/14/14 0710  11/15/14 0634  WBC 18.3* 14.3*  --  8.5  NEUTROABS 15.0*  --   --   --   HGB 6.4* 5.7* 6.0* 7.5*  HCT 19.0* 16.7* 18.1* 22.6*  MCV 90.5 88.8  --  87.3  PLT 565* 535*  --  596*   Cardiac Enzymes: No results for input(s): CKTOTAL, CKMB, CKMBINDEX, TROPONINI in the last 168 hours. BNP: Invalid input(s): POCBNP CBG: No results for input(s): GLUCAP in the last 168 hours.   Micro Results: Recent Results (from the past 240 hour(s))  MRSA PCR Screening     Status: Abnormal   Collection Time: 11/12/14  4:17 PM   Result Value Ref Range Status   MRSA by PCR POSITIVE (A) NEGATIVE Final    Comment:        The GeneXpert MRSA Assay (FDA approved for NASAL specimens only), is one component of a comprehensive MRSA colonization surveillance program. It is not intended to diagnose MRSA infection nor to guide or monitor treatment for MRSA infections. RESULT CALLED TO, READ BACK BY AND VERIFIED WITH: RINANEO,Y RN [phone] [id number] SKEEN,P     Studies/Results: No results found.  Medications: Scheduled Meds: . sodium chloride   Intravenous Once  . Chlorhexidine Gluconate Cloth  6 each Topical Q0600  . docusate sodium  100 mg Oral BID  . enoxaparin (LOVENOX) injection  40 mg Subcutaneous Q24H  . folic acid  1 mg Oral Daily  . mupirocin ointment  1 application Nasal BID  . nystatin cream   Topical BID  . vancomycin  1,500 mg Intravenous Q12H   Time spent 25 minutes     LOS: 3 days   Jeffrey Morrison M.D. [facility] 11/15/2014, 11:59 AM Pager: [phone]  If 7PM-7AM, please contact night-coverage [email or web address] Password [email or web address]

## 2014-11-15 NOTE — Progress Notes (Signed)
ANTIBIOTIC CONSULT NOTE - Follow-up  Pharmacy Consult for Vancomycin Indication: Cellulitis  No Known Allergies  Patient Measurements: Height: 5\' 8"  (172.7 cm) Weight: 163 lb (73.936 kg) IBW/kg (Calculated) : 68.4  Vital Signs: Temp: 98.2 F (36.8 C) (03/02 0500) Temp Source: Oral (03/02 0500) BP: 95/44 mmHg (03/02 0500) Pulse Rate: 55 (03/02 0500) Intake/Output from previous day: 03/01 0701 - 03/02 0700 In: 2395 [P.O.:1560; Blood:335; IV Piggyback:500] Out: -  Intake/Output from this shift:    Labs:  Recent Labs  11/12/14 1247 11/13/14 0700 11/14/14 0710 11/15/14 0634  WBC 18.3* 14.3*  --  8.5  HGB 6.4* 5.7* 6.0* 7.5*  PLT 565* 535*  --  596*  CREATININE 1.00  --   --   --    Estimated Creatinine Clearance: 114 mL/min (by C-G formula based on Cr of 1). No results for input(s): VANCOTROUGH, VANCOPEAK, VANCORANDOM, GENTTROUGH, GENTPEAK, GENTRANDOM, TOBRATROUGH, TOBRAPEAK, TOBRARND, AMIKACINPEAK, AMIKACINTROU, AMIKACIN in the last 72 hours.   Microbiology: Recent Results (from the past 720 hour(s))  MRSA PCR Screening     Status: Abnormal   Collection Time: 11/12/14  4:17 PM  Result Value Ref Range Status   MRSA by PCR POSITIVE (A) NEGATIVE Final    Comment:        The GeneXpert MRSA Assay (FDA approved for NASAL specimens only), is one component of a comprehensive MRSA colonization surveillance program. It is not intended to diagnose MRSA infection nor to guide or monitor treatment for MRSA infections. RESULT CALLED TO, READ BACK BY AND VERIFIED WITH: RINANEO,Y RN [phone] SKEEN,P     Medical History: Past Medical History  Diagnosis Date  . Sickle cell crisis   . Chronic lower back pain     Assessment: 48 YOM with sickle cell disease on Vancomycin Day #4 for LLE cellulitis. Pt now getting wound care. Noted plan for ID follow-up and bactrim/doxycycline oral on discharge. WBC down to nl. Afeb. No cultures done. SCr remains stable. UOP  good.  Goal of Therapy:  Vancomycin trough level 10-15 mcg/ml  Plan:  Continue Vancomycin 1500 mg IV every 12 hours Will continue to follow renal function, LOT, and antibiotic de-escalation plans  Vanc trough in next 2-3 days if continues  Sherlon Handing, PharmD, BCPS Clinical pharmacist, pager [phone] 11/15/2014 8:20 AM

## 2014-11-15 NOTE — Consult Note (Signed)
[facility]  Total days of antibiotics 4        Day 4 vancomycin               Reason for Consult: mrsa folliculitis/carbuncles   Referring Physician: Tana Coast  Principal Problem:   Cellulitis Active Problems:   Leukocytosis   Anemia of chronic disease   Acute folliculitis   Tinea cruris    HPI: Jeffrey Morrison is a 19 y.o. male with history of sickle cell disease admitted for worsening left lower extremity pain, swelling, and erythema. He has had numerous pustules on bilateral lower extremities starting to drain. He was found to have leukocytosis of 15K, and fevers. Started on vancomycin for cellulitis, since he has hx of mrsa colonization. He has had hx of this occurring in the past. Legs are still exquisitely painful. Unable to tolerate showers with warm water hitting skin lesions  Past Medical History  Diagnosis Date  . Sickle cell crisis   . Chronic lower back pain     Allergies: No Known Allergies  MEDICATIONS: . sodium chloride   Intravenous Once  . Chlorhexidine Gluconate Cloth  6 each Topical Q0600  . docusate sodium  100 mg Oral BID  . enoxaparin (LOVENOX) injection  40 mg Subcutaneous Q24H  . folic acid  1 mg Oral Daily  . mupirocin ointment  1 application Nasal BID  . nystatin cream   Topical BID  . vancomycin  1,500 mg Intravenous Q12H    History  Substance Use Topics  . Smoking status: Current Every Day Smoker -- 0.50 packs/day    Types: Cigarettes  . Smokeless tobacco: Not on file  . Alcohol Use: No    No family history on file.  Review of Systems  Constitutional: positive for fever, chills, diaphoresis, activity change, appetite change, fatigue and unexpected weight change.  HENT: Negative for congestion, sore throat, rhinorrhea, sneezing, trouble swallowing and sinus pressure.  Eyes: Negative for photophobia and visual disturbance.  Respiratory: Negative for cough, chest tightness, shortness of breath, wheezing and  stridor.  Cardiovascular: Negative for chest pain, palpitations and leg swelling.  Gastrointestinal: Negative for nausea, vomiting, abdominal pain, diarrhea, constipation, blood in stool, abdominal distention and anal bleeding.  Genitourinary: Negative for dysuria, hematuria, flank pain and difficulty urinating.  Musculoskeletal: Negative for myalgias, back pain, joint swelling, arthralgias and gait problem.  Skin: positive for rash and wound per hpi Neurological: Negative for dizziness, tremors, weakness and light-headedness.  Hematological: Negative for adenopathy. Does not bruise/bleed easily.  Psychiatric/Behavioral: Negative for behavioral problems, confusion, sleep disturbance, dysphoric mood, decreased concentration and agitation.     OBJECTIVE: Temp:  [97.4 F (36.3 C)-98.4 F (36.9 C)] 98.2 F (36.8 C) (03/02 0500) Pulse Rate:  [46-69] 55 (03/02 0500) Resp:  [16-19] 16 (03/02 0500) BP: (95-118)/(44-61) 95/44 mmHg (03/02 0500) SpO2:  [98 %-100 %] 100 % (03/02 0500) Physical Exam  Constitutional: He is oriented to person, place, and time. He appears well-developed and well-nourished. No distress.  HENT:  Mouth/Throat: Oropharynx is clear and moist. No oropharyngeal exudate. Poor dentition Cardiovascular: Normal rate, regular rhythm and normal heart sounds. Exam reveals no gallop and no friction rub.  No murmur heard.  Pulmonary/Chest: Effort normal and breath sounds normal. No respiratory distress. He has no wheezes.  Abdominal: Soft. Bowel sounds are normal. He exhibits no distension. There is no tenderness.  Lymphadenopathy:  He has no cervical adenopathy.  Neurological: He is alert and numerous pustules draining,  some nearly close to spontaneous drainage to lower extremities bilaterally Ext: tender to palpation in areas of pustules, trace edema, wearing right ankle surveillance bracelet Psychiatric: He has a normal mood and affect. His behavior is normal.      LABS: Results for orders placed or performed during the hospital encounter of 11/12/14 (from the past 48 hour(s))  Hemoglobin and hematocrit, blood     Status: Abnormal   Collection Time: 11/14/14  7:10 AM  Result Value Ref Range   Hemoglobin 6.0 (LL) 13.0 - 17.0 g/dL    Comment: REPEATED TO VERIFY POST TRANSFUSION SPECIMEN CRITICAL VALUE NOTED.  VALUE IS CONSISTENT WITH PREVIOUSLY REPORTED AND CALLED VALUE.    HCT 18.1 (L) 39.0 - 52.0 %  Prepare RBC     Status: None   Collection Time: 11/14/14 11:01 AM  Result Value Ref Range   Order Confirmation ORDER PROCESSED BY BLOOD BANK   CBC     Status: Abnormal   Collection Time: 11/15/14  6:34 AM  Result Value Ref Range   WBC 8.5 4.0 - 10.5 K/uL   RBC 2.59 (L) 4.22 - 5.81 MIL/uL   Hemoglobin 7.5 (L) 13.0 - 17.0 g/dL    Comment: REPEATED TO VERIFY POST TRANSFUSION SPECIMEN    HCT 22.6 (L) 39.0 - 52.0 %   MCV 87.3 78.0 - 100.0 fL   MCH 29.0 26.0 - 34.0 pg   MCHC 33.2 30.0 - 36.0 g/dL   RDW 20.8 (H) 11.5 - 15.5 %   Platelets 596 (H) 150 - 400 K/uL    MICRO: mrsa colonized   Assessment/Plan:  18yo with lower extremity MRSA cellulitis c/b carbuncle/furuncles  - continue with IV vancomycin for now, can transition to doxycycline 100mg  BID when ready for discharge. Would treat for 14 days - consider asking wound care if patient would benefit from using santyl to the ulcers that have open wound bed with fibrinous debris -  Would give him mupirocin in nares TID x 10 days as well as part of decolonization to help prevent further recurrences  Ileanna Gemmill [facility] [phone]

## 2014-11-15 NOTE — Progress Notes (Deleted)
Tylene Fantasia NP notified of BP and HR, no orders received

## 2014-11-16 DIAGNOSIS — L03119 Cellulitis of unspecified part of limb: Secondary | ICD-10-CM

## 2014-11-16 LAB — CBC
HEMATOCRIT: 23.8 % — AB (ref 39.0–52.0)
Hemoglobin: 8 g/dL — ABNORMAL LOW (ref 13.0–17.0)
MCH: 29.4 pg (ref 26.0–34.0)
MCHC: 33.6 g/dL (ref 30.0–36.0)
MCV: 87.5 fL (ref 78.0–100.0)
Platelets: 666 10*3/uL — ABNORMAL HIGH (ref 150–400)
RBC: 2.72 MIL/uL — ABNORMAL LOW (ref 4.22–5.81)
RDW: 21.5 % — AB (ref 11.5–15.5)
WBC: 9.9 10*3/uL (ref 4.0–10.5)

## 2014-11-16 MED ORDER — BACITRACIN-NEOMYCIN-POLYMYXIN 400-5-5000 EX OINT: TOPICAL_OINTMENT | Freq: Every day | CUTANEOUS | Status: DC

## 2014-11-16 MED ORDER — HYDROXYUREA 500 MG PO CAPS: 500.0000 mg | ORAL_CAPSULE | Freq: Every day | ORAL | Status: DC

## 2014-11-16 MED ORDER — DOXYCYCLINE HYCLATE 100 MG PO TABS: 100.0000 mg | ORAL_TABLET | Freq: Two times a day (BID) | ORAL | Status: DC

## 2014-11-16 MED ORDER — MUPIROCIN 2 % EX OINT: 1.0000 "application " | TOPICAL_OINTMENT | Freq: Two times a day (BID) | CUTANEOUS | Status: DC

## 2014-11-16 MED ORDER — OXYCODONE-ACETAMINOPHEN 5-325 MG PO TABS: 1.0000 | ORAL_TABLET | Freq: Three times a day (TID) | ORAL | Status: DC | PRN

## 2014-11-16 MED ORDER — NYSTATIN 100000 UNIT/GM EX CREA: TOPICAL_CREAM | Freq: Two times a day (BID) | CUTANEOUS | Status: DC

## 2014-11-16 MED ORDER — FOLIC ACID 1 MG PO TABS: 1.0000 mg | ORAL_TABLET | Freq: Every day | ORAL | Status: DC

## 2014-11-16 MED ORDER — DIPHENHYDRAMINE HCL 25 MG PO CAPS: 25.0000 mg | ORAL_CAPSULE | Freq: Four times a day (QID) | ORAL | Status: DC | PRN

## 2014-11-16 MED ORDER — DOXYCYCLINE HYCLATE 100 MG PO TABS
100.0000 mg | ORAL_TABLET | Freq: Two times a day (BID) | ORAL | Status: DC
Start: 2014-11-16 — End: 2014-11-16
  Administered 2014-11-16: 100 mg via ORAL
  Filled 2014-11-16: qty 1

## 2014-11-16 NOTE — Care Management Note (Signed)
CARE MANAGEMENT NOTE 11/16/2014  Patient:  Jeffrey Morrison, Jeffrey Morrison   Account Number:  [id number]  Date Initiated:  11/16/2014  Documentation initiated by:  Ricki Miller  Subjective/Objective Assessment:   19 yr old male admitted with bilateral lower extremitiy cellulitis. Hx of sickle cell.     Action/Plan:   Case manager arranged appointment at [facility] for patient on 11/16/13 @ 9:15am.  Patient given appointment info and contact number. Pt will need taxi transport home. SW notified.   Anticipated DC Date:  11/16/2014   Anticipated DC Plan:  HOME/SELF CARE  In-house referral  Clinical Social Worker      [facility]  Follow-up appt scheduled      [facility] Choice  NA   Choice offered to / List presented to:     DME arranged  NA        HH arranged  NA      Status of service:  Completed, signed off Medicare Important Message given?   (If response is "NO", the following Medicare IM given date fields will be blank) Date Medicare IM given:   Medicare IM given by:   Date Additional Medicare IM given:   Additional Medicare IM given by:    Discharge Disposition:  HOME/SELF CARE  Per UR Regulation:    If discussed at Long Length of Stay Meetings, dates discussed:    Comments:

## 2014-11-16 NOTE — Discharge Summary (Signed)
Physician Discharge Summary  Jeffrey Morrison ID: Jeffrey Morrison MRN: [id number] DOB/AGE: Oct 06, 1995 19 y.o.  Admit date: 11/12/2014 Discharge date: 11/16/2014  Primary Care Physician:  No PCP Per Jeffrey Morrison  Discharge Diagnoses:    . Cellulitis of the left lower extremity  . Anemia of chronic disease . Leukocytosis . folliculitis  Consults:  Infectious disease, Dr. Graylon Good   Recommendations for Outpatient Follow-up:   Outpatient follow-up was arranged at [facility]. 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  Jeffrey Morrison was explained Wound Care, Neosporin ointment on the open areas and cover with Band-Aid, change daily Mupirocin ointment nares for 10 days  Doxycycline 100 mg twice a day for 14 days     DIET: Regular diet   Allergies:  No Known Allergies   Discharge Medications:   Medication List    STOP taking these medications        ibuprofen 400 MG tablet  Commonly known as:  ADVIL,MOTRIN     penicillin v potassium 500 MG tablet  Commonly known as:  VEETID     traMADol 50 MG tablet  Commonly known as:  ULTRAM      TAKE these medications        diphenhydrAMINE 25 mg capsule  Commonly known as:  BENADRYL  Take 1 capsule (25 mg total) by mouth every 6 (six) hours as needed for itching (also available over the counter).     doxycycline 100 MG tablet  Commonly known as:  VIBRA-TABS  Take 1 tablet (100 mg total) by mouth 2 (two) times daily. X [id number]     folic acid 1 MG tablet  Commonly known as:  FOLVITE  Take 1 tablet (1 mg total) by mouth daily.     hydroxyurea 500 MG capsule  Commonly known as:  HYDREA  Take 1 capsule (500 mg total) by mouth daily. May take with food to minimize GI side effects.     mupirocin ointment 2 %  Commonly known as:  BACTROBAN  Place 1 application into the nose 2 (two) times daily. Each nare for 10 days     neomycin-bacitracin-polymyxin ointment  Commonly known as:  NEOSPORIN  Apply  topically daily. apply to wounds daily, also over the counter     nystatin cream  Commonly known as:  MYCOSTATIN  Apply topically 2 (two) times daily. Apply to the groin     oxyCODONE-acetaminophen 5-325 MG per tablet  Commonly known as:  PERCOCET  Take 1 tablet by mouth every 8 (eight) hours as needed for severe pain.         Brief H and P: For complete details please refer to admission H and P, but in brief Jeffrey Morrison is a 19 y.o. male, with a past medical history of sickle cell disease and chronic lower back pain who presents to the emergency department with lower extremity cellulitis. Jeffrey Morrison reports that he frequently gets pustules on his buttocks and lower extremities. These irritate him so badly that he scratches them. Approximately 3 days ago his left leg began to swell. Here in the ER, he felt a tight. He reported being taken off of PCN, hydroxy urea and folic acid recently by Dr. Zigmund Daniel. His last sickle cell flare was approximately 2 weeks ago. He reported that he smoked weed for the pain and stayed home as he did not want to come to the hospital. In the ER the Jeffrey Morrison's WBC is 18.3. His left lower extremity has mild erythema and 2+  edema thru the dorsum of his foot.   Hospital Course:  Left lower extremity Cellulitis with weeping pustules from the left leg-significantly improved  - Blood cultures negative so far, HIV negative, leukocytosis resolved -  Jeffrey Morrison was placed on IV vancomycin, wound care consult was placed and Jeffrey Morrison was recommended daily wound care, please Neosporin ointment on the open wounds and cover with Band-Aid. His cellulitis has tremendously improved. Infectious disease consult was obtained, Jeffrey Morrison was seen by Dr. Graylon Good who recommended doxycycline for 14 days. Venous Dopplers for the lower extremities was negative for DVT.    Leukocytosis - Resolved, likely due to #1    Acute on chronic Anemia of chronic disease due to sickle cell disease -  Jeffrey Morrison has history of sickle cell disease, baseline hemoglobin ~7, Jeffrey Morrison's hemoglobin had dropped down to 5.7, required 2 units of packed RBC transfusion. Hemoglobin improved to 8.0 at the time of discharge.   Acute folliculitis - Continue wound care, will benefit from dermatology referral outpatient. Hospital follow-[facility] with [facility] arranged.    Tinea cruris Continue nystatin cream to the groin    Day of Discharge BP 108/46 mmHg  Pulse 63  Temp(Src) 98.4 F (36.9 C) (Oral)  Resp 18  Ht 5\' 8"  (1.727 m)  Wt 73.936 kg (163 lb)  BMI 24.79 kg/m2  SpO2 100%  Physical Exam: General: Alert and awake oriented x3 not in any acute distress. HEENT: anicteric sclera, pupils reactive to light and accommodation CVS: S1-S2 clear no murmur rubs or gallops Chest: clear to auscultation bilaterally, no wheezing rales or rhonchi Abdomen: soft nontender, nondistended, normal bowel sounds Extremities: no cyanosis, clubbing or edema noted bilaterally, left lower extremity cellulitis with wounds have significantly improved Neuro: Cranial nerves II-XII intact, no focal neurological deficits   The results of significant diagnostics from this hospitalization (including imaging, microbiology, ancillary and laboratory) are listed below for reference.    LAB RESULTS: Basic Metabolic Panel:  Recent Labs Lab 11/12/14 1247  NA 138  K 4.2  CL 105  CO2 28  GLUCOSE 93  BUN 10  CREATININE 1.00  CALCIUM 8.9   Liver Function Tests: No results for input(s): AST, ALT, ALKPHOS, BILITOT, PROT, ALBUMIN in the last 168 hours. No results for input(s): LIPASE, AMYLASE in the last 168 hours. No results for input(s): AMMONIA in the last 168 hours. CBC:  Recent Labs Lab 11/12/14 1247  11/15/14 0634 11/16/14 0535  WBC 18.3*  < > 8.5 9.9  NEUTROABS 15.0*  --   --   --   HGB 6.4*  < > 7.5* 8.0*  HCT 19.0*  < > 22.6* 23.8*  MCV 90.5  < > 87.3 87.5  PLT 565*  < > 596* 666*  < > =  values in this interval not displayed. Cardiac Enzymes: No results for input(s): CKTOTAL, CKMB, CKMBINDEX, TROPONINI in the last 168 hours. BNP: Invalid input(s): POCBNP CBG: No results for input(s): GLUCAP in the last 168 hours.  Significant Diagnostic Studies:  No results found.  2D ECHO:   Disposition and Follow-up: Discharge Instructions    Diet - low sodium heart healthy    Complete by:  As directed      Diet general    Complete by:  As directed      Discharge instructions    Complete by:  As directed   Wound care: Apply to left lateral open wound daily.     Increase activity slowly    Complete by:  As  directed      Increase activity slowly    Complete by:  As directed             DISPOSITION: Home   DISCHARGE FOLLOW-UP Follow-up Information    Follow up with [facility]    .   Why:  You have an appointment scheduled for Friday, November 17, 2014 at 9:15AM   Contact information:   [address] [place] [address] [phone]       Time spent on Discharge: 35 minutes   Signed:   RAI,RIPUDEEP M.D. [facility] 11/16/2014, 1:55 PM Pager: [phone]

## 2014-11-16 NOTE — Consult Note (Signed)
WOC wound consult note Reason for Consult: reeval LE wounds for use of enzymatic debridement ointment.  Wound type: lesions of unknown etiology, may be related to sickle cell disorder Measurement: scattered lesions over the bilateral LE and buttocks Wound bed:all are crusted over now, one open area noted left lateral leg that is dry and not necrotic.  Drainage (amount, consistency, odor) serosanguineous on the sheets  Periwound: intact Dressing procedure/placement/frequency: Antibiotic ointment to the one open area, cover with band aid. Change daily.   Discussed POC with patient and hospitalist   Re consult if needed, will not follow at this time. Thanks  Palmer Shorey Kellogg, [place] [phone])

## 2014-11-16 NOTE — Progress Notes (Signed)
Social worker [phone]) informed that patient is ready for discharge and needs a voucher for a taxi. Arrangements being made for Medicaid taxi.

## 2014-11-17 ENCOUNTER — Inpatient Hospital Stay: Payer: Medicaid Other

## 2015-01-04 ENCOUNTER — Emergency Department (HOSPITAL_COMMUNITY)
Admission: EM | Admit: 2015-01-04 | Discharge: 2015-01-04 | Disposition: A | Discharge status: Home / Self Care | Payer: Medicaid Other | Attending: Emergency Medicine | Admitting: Emergency Medicine

## 2015-01-04 ENCOUNTER — Encounter (HOSPITAL_COMMUNITY): Payer: Self-pay

## 2015-01-04 DIAGNOSIS — M25511 Pain in right shoulder: Secondary | ICD-10-CM | POA: Diagnosis not present

## 2015-01-04 DIAGNOSIS — Z792 Long term (current) use of antibiotics: Secondary | ICD-10-CM | POA: Diagnosis not present

## 2015-01-04 DIAGNOSIS — D5700 Hb-SS disease with crisis, unspecified: Secondary | ICD-10-CM | POA: Diagnosis present

## 2015-01-04 DIAGNOSIS — G8929 Other chronic pain: Secondary | ICD-10-CM | POA: Insufficient documentation

## 2015-01-04 DIAGNOSIS — Z720 Tobacco use: Secondary | ICD-10-CM | POA: Insufficient documentation

## 2015-01-04 DIAGNOSIS — M25512 Pain in left shoulder: Secondary | ICD-10-CM | POA: Diagnosis not present

## 2015-01-04 DIAGNOSIS — Z79899 Other long term (current) drug therapy: Secondary | ICD-10-CM | POA: Diagnosis not present

## 2015-01-04 LAB — BASIC METABOLIC PANEL
Anion gap: 7 (ref 5–15)
BUN: 8 mg/dL (ref 6–23)
CHLORIDE: 106 mmol/L (ref 96–112)
CO2: 26 mmol/L (ref 19–32)
CREATININE: 0.92 mg/dL (ref 0.50–1.35)
Calcium: 9.4 mg/dL (ref 8.4–10.5)
GFR calc Af Amer: >90 mL/min (ref >90)
Glucose, Bld: 100 mg/dL — ABNORMAL HIGH (ref 70–99)
POTASSIUM: 3.8 mmol/L (ref 3.5–5.1)
SODIUM: 139 mmol/L (ref 135–145)

## 2015-01-04 LAB — CBC WITH DIFFERENTIAL/PLATELET
Basophils Absolute: 0 10*3/uL (ref 0.0–0.1)
Basophils Relative: 0 % (ref 0–1)
EOS ABS: 0.2 10*3/uL (ref 0.0–0.7)
Eosinophils Relative: 1 % (ref 0–5)
HCT: 20 % — ABNORMAL LOW (ref 39.0–52.0)
Hemoglobin: 7.1 g/dL — ABNORMAL LOW (ref 13.0–17.0)
LYMPHS ABS: 1.5 10*3/uL (ref 0.7–4.0)
Lymphocytes Relative: 10 % — ABNORMAL LOW (ref 12–46)
MCH: 32.7 pg (ref 26.0–34.0)
MCHC: 35.5 g/dL (ref 30.0–36.0)
MCV: 92.2 fL (ref 78.0–100.0)
MONOS PCT: 11 % (ref 3–12)
Monocytes Absolute: 1.7 10*3/uL — ABNORMAL HIGH (ref 0.1–1.0)
NEUTROS ABS: 11.7 10*3/uL — AB (ref 1.7–7.7)
Neutrophils Relative %: 78 % — ABNORMAL HIGH (ref 43–77)
PLATELETS: 379 10*3/uL (ref 150–400)
RBC: 2.17 MIL/uL — ABNORMAL LOW (ref 4.22–5.81)
RDW: 24.4 % — AB (ref 11.5–15.5)
WBC: 15.1 10*3/uL — ABNORMAL HIGH (ref 4.0–10.5)

## 2015-01-04 MED ORDER — OXYCODONE-ACETAMINOPHEN 5-325 MG PO TABS: 1.0000 | ORAL_TABLET | ORAL | Status: DC | PRN

## 2015-01-04 MED ORDER — SODIUM CHLORIDE 0.9 % IV SOLN
INTRAVENOUS | Status: DC
  Administered 2015-01-04: 11:00:00 via INTRAVENOUS

## 2015-01-04 MED ORDER — HYDROMORPHONE HCL 1 MG/ML IJ SOLN
1.0000 mg | INTRAMUSCULAR | Status: DC | PRN
  Administered 2015-01-04: 1 mg via INTRAVENOUS
  Filled 2015-01-04: qty 1

## 2015-01-04 NOTE — ED Notes (Signed)
Pt complaining of bilateral shoulder pain. Hx. Sickle cell disease. Pt states no relief with heat/cold compress. Rates pain 6/10.

## 2015-01-04 NOTE — Discharge Instructions (Signed)
Call the wellness clinic, today to schedule a follow-up appointment for further care. Get plenty of rest, and drink a lot of fluids. Return here if needed, for problems.    Sickle Cell Anemia, Adult Sickle cell anemia is a condition in which red blood cells have an abnormal "sickle" shape. This abnormal shape shortens the cells' life span, which results in a lower than normal concentration of red blood cells in the blood. The sickle shape also causes the cells to clump together and block free blood flow through the blood vessels. As a result, the tissues and organs of the body do not receive enough oxygen. Sickle cell anemia causes organ damage and pain and increases the risk of infection. CAUSES  Sickle cell anemia is a genetic disorder. Those who receive two copies of the gene have the condition, and those who receive one copy have the trait. RISK FACTORS The sickle cell gene is most common in people whose families originated in [place]. Other areas of the globe where sickle cell trait occurs include the [place], [place] and [place], and the [place].  SIGNS AND SYMPTOMS  Pain, especially in the extremities, back, chest, or abdomen (common). The pain may start suddenly or may develop following an illness, especially if there is dehydration. Pain can also occur due to overexertion or exposure to extreme temperature changes.  Frequent severe bacterial infections, especially certain types of pneumonia and meningitis.  Pain and swelling in the hands and feet.  Decreased activity.   Loss of appetite.   Change in behavior.  Headaches.  Seizures.  Shortness of breath or difficulty breathing.  Vision changes.  Skin ulcers. Those with the trait may not have symptoms or they may have mild symptoms.  DIAGNOSIS  Sickle cell anemia is diagnosed with blood tests that demonstrate the genetic trait. It is often diagnosed during the newborn period, due to  mandatory testing nationwide. A variety of blood tests, X-rays, CT scans, MRI scans, ultrasounds, and lung function tests may also be done to monitor the condition. TREATMENT  Sickle cell anemia may be treated with:  Medicines. You may be given pain medicines, antibiotic medicines (to treat and prevent infections) or medicines to increase the production of certain types of hemoglobin.  Fluids.  Oxygen.  Blood transfusions. HOME CARE INSTRUCTIONS   Drink enough fluid to keep your urine clear or pale yellow. Increase your fluid intake in hot weather and during exercise.  Do not smoke. Smoking lowers oxygen levels in the blood.   Only take over-the-counter or prescription medicines for pain, fever, or discomfort as directed by your health care provider.  Take antibiotics as directed by your health care provider. Make sure you finish them it even if you start to feel better.   Take supplements as directed by your health care provider.   Consider wearing a medical alert bracelet. This tells anyone caring for you in an emergency of your condition.   When traveling, keep your medical information, health care provider's names, and the medicines you take with you at all times.   If you develop a fever, do not take medicines to reduce the fever right away. This could cover up a problem that is developing. Notify your health care provider.  Keep all follow-up appointments with your health care provider. Sickle cell anemia requires regular medical care. SEEK MEDICAL CARE IF: You have a fever. SEEK IMMEDIATE MEDICAL CARE IF:   You feel dizzy or faint.   You have  new abdominal pain, especially on the left side near the stomach area.   You develop a persistent, often uncomfortable and painful penile erection (priapism). If this is not treated immediately it will lead to impotence.   You have numbness your arms or legs or you have a hard time moving them.   You have a hard time  with speech.   You have a fever or persistent symptoms for more than 2-3 days.   You have a fever and your symptoms suddenly get worse.   You have signs or symptoms of infection. These include:   Chills.   Abnormal tiredness (lethargy).   Irritability.   Poor eating.   Vomiting.   You develop pain that is not helped with medicine.   You develop shortness of breath.  You have pain in your chest.   You are coughing up pus-like or bloody sputum.   You develop a stiff neck.  Your feet or hands swell or have pain.  Your abdomen appears bloated.  You develop joint pain. MAKE SURE YOU:  Understand these instructions. Document Released: 12/10/2005 Document Revised: 01/16/2014 Document Reviewed: 04/13/2013 [facility] Patient Information 2015 [place], [place]. This information is not intended to replace advice given to you by your health care provider. Make sure you discuss any questions you have with your health care provider.

## 2015-01-04 NOTE — ED Provider Notes (Signed)
CSN: [id number]     Arrival date & time 01/04/15  0911 History   First MD Initiated Contact with Patient 01/04/15 0914     Chief Complaint  Patient presents with  . Sickle Cell Pain Crisis     (Consider location/radiation/quality/duration/timing/severity/associated sxs/prior Treatment) HPI   Jeffrey Morrison is a 19 y.o. male presents for evaluation of shoulder pain which is typical of his sickle cell crisis. Pain has been present for 2 days. Pain is worse with movement. Pain is not improved with application of Icey Hot. He has not tried anything else. Is not currently taking any other medicines per his report. He does not have a primary care doctor. He denies chest pain, cough, nausea, vomiting, weakness or dizziness. There is no pain in his legs. He has no problems walking. There are no other known modifying factors.  Past Medical History  Diagnosis Date  . Sickle cell crisis   . Chronic lower back pain    Past Surgical History  Procedure Laterality Date  . Splenectomy     History reviewed. No pertinent family history. History  Substance Use Topics  . Smoking status: Current Every Day Smoker -- 0.50 packs/day    Types: Cigarettes  . Smokeless tobacco: Not on file  . Alcohol Use: No    Review of Systems  All other systems reviewed and are negative.     Allergies  Review of patient's allergies indicates no known allergies.  Home Medications   Prior to Admission medications   Medication Sig Start Date End Date Taking? Authorizing Provider  folic acid (FOLVITE) 1 MG tablet Take 1 tablet (1 mg total) by mouth daily. 11/16/14  Yes Ripudeep Krystal Eaton, MD  hydroxyurea (HYDREA) 500 MG capsule Take 1 capsule (500 mg total) by mouth daily. May take with food to minimize GI side effects. 11/16/14  Yes Ripudeep Krystal Eaton, MD  ibuprofen (ADVIL,MOTRIN) 200 MG tablet Take 400 mg by mouth every 6 (six) hours as needed for mild pain or moderate pain.   Yes Historical Provider, MD  Menthol-Methyl  Salicylate (ICY HOT EXTRA STRENGTH) 10-30 % CREA Apply 1 application topically daily as needed (pain).   Yes Historical Provider, MD  mupirocin ointment (BACTROBAN) 2 % Place 1 application into the nose 2 (two) times daily. Each nare for 10 days 11/16/14  Yes Ripudeep Krystal Eaton, MD  nystatin cream (MYCOSTATIN) Apply topically 2 (two) times daily. Apply to the groin 11/16/14  Yes Ripudeep Krystal Eaton, MD  oxyCODONE-acetaminophen (PERCOCET) 5-325 MG per tablet Take 1 tablet by mouth every 4 (four) hours as needed. 01/04/15   Daleen Bo, MD   BP 113/70 mmHg  Pulse 64  Temp(Src) 98.6 F (37 C) (Oral)  Resp 21  Ht 5\' 9"  (1.753 m)  Wt 165 lb (74.844 kg)  BMI 24.36 kg/m2  SpO2 99% Physical Exam  Constitutional: He is oriented to person, place, and time. He appears well-developed and well-nourished. No distress.  HENT:  Head: Normocephalic and atraumatic.  Right Ear: External ear normal.  Left Ear: External ear normal.  Eyes: Conjunctivae and EOM are normal. Pupils are equal, round, and reactive to light.  Neck: Normal range of motion and phonation normal. Neck supple.  Cardiovascular: Normal rate, regular rhythm and normal heart sounds.   Pulmonary/Chest: Effort normal and breath sounds normal. He exhibits no bony tenderness.  Abdominal: Soft. There is no tenderness.  Musculoskeletal: Normal range of motion.  Mild shoulder pain, left greater than right with palpation. Good  range of motion. Shoulders, both passively and actively.  Neurological: He is alert and oriented to person, place, and time. No cranial nerve deficit or sensory deficit. He exhibits normal muscle tone. Coordination normal.  Skin: Skin is warm, dry and intact.  Psychiatric: He has a normal mood and affect. His behavior is normal. Judgment and thought content normal.  Nursing note and vitals reviewed.   ED Course  Procedures (including critical care time) Clinically, he has mild sickle cell crisis. Will evaluate with labs, treat with  analgesia, and reevaluate.  Medications  HYDROmorphone (DILAUDID) injection 1 mg (1 mg Intravenous Given 01/04/15 1045)  0.9 %  sodium chloride infusion ( Intravenous New Bag/Given 01/04/15 1045)    Patient Vitals for the past 24 hrs:  BP Temp Temp src Pulse Resp SpO2 Height Weight  01/04/15 1046 113/70 mmHg - - 64 21 99 % - -  01/04/15 0921 133/66 mmHg 98.6 F (37 C) Oral 68 15 100 % 5\' 9"  (1.753 m) 165 lb (74.844 kg)    12:07 PM Reevaluation with update and discussion. After initial assessment and treatment, an updated evaluation reveals his pain is better now. He is able to move his arms easily without pain and has no further complaints. He feels like he is well enough to go home. Findings discussed with patient, all questions answered. Jule Whitsel L    Labs Review Labs Reviewed  BASIC METABOLIC PANEL - Abnormal; Notable for the following:    Glucose, Bld 100 (*)    All other components within normal limits  CBC WITH DIFFERENTIAL/PLATELET - Abnormal; Notable for the following:    WBC 15.1 (*)    RBC 2.17 (*)    Hemoglobin 7.1 (*)    HCT 20.0 (*)    RDW 24.4 (*)    Neutrophils Relative % 78 (*)    Lymphocytes Relative 10 (*)    Neutro Abs 11.7 (*)    Monocytes Absolute 1.7 (*)    All other components within normal limits    Imaging Review No results found.   EKG Interpretation None      MDM   Final diagnoses:  Sickle cell crisis  Bilateral shoulder pain    Nonspecific shoulder pain, possibly sickle cell crisis, without significant hemolysis. Pain has been controlled with a single dose of Dilaudid. Patient does not currently have a primary care provider or take any medications.   Nursing Notes Reviewed/ Care Coordinated Applicable Imaging Reviewed Interpretation of Laboratory Data incorporated into ED treatment  The patient appears reasonably screened and/or stabilized for discharge and I doubt any other medical condition or other [facility] requiring further  screening, evaluation, or treatment in the ED at this time prior to discharge.  Plan: Home Medications- Percocet; Home Treatments- rest; return here if the recommended treatment, does not improve the symptoms; Recommended follow up- PCP prn   Daleen Bo, MD 01/08/15 [phone]

## 2015-07-05 ENCOUNTER — Emergency Department (HOSPITAL_COMMUNITY): Payer: Medicaid Other

## 2015-07-05 ENCOUNTER — Inpatient Hospital Stay (HOSPITAL_COMMUNITY)
Admission: EM | Admit: 2015-07-05 | Discharge: 2015-07-08 | DRG: 811 | Disposition: A | Discharge status: Home / Self Care | Payer: Self-pay | Attending: Internal Medicine | Admitting: Internal Medicine

## 2015-07-05 ENCOUNTER — Encounter (HOSPITAL_COMMUNITY): Payer: Self-pay | Admitting: *Deleted

## 2015-07-05 DIAGNOSIS — D5700 Hb-SS disease with crisis, unspecified: Secondary | ICD-10-CM

## 2015-07-05 DIAGNOSIS — F17210 Nicotine dependence, cigarettes, uncomplicated: Secondary | ICD-10-CM | POA: Diagnosis present

## 2015-07-05 DIAGNOSIS — D5701 Hb-SS disease with acute chest syndrome: Principal | ICD-10-CM | POA: Diagnosis present

## 2015-07-05 DIAGNOSIS — J189 Pneumonia, unspecified organism: Secondary | ICD-10-CM | POA: Diagnosis present

## 2015-07-05 DIAGNOSIS — Z8614 Personal history of Methicillin resistant Staphylococcus aureus infection: Secondary | ICD-10-CM

## 2015-07-05 DIAGNOSIS — D638 Anemia in other chronic diseases classified elsewhere: Secondary | ICD-10-CM | POA: Diagnosis present

## 2015-07-05 LAB — COMPREHENSIVE METABOLIC PANEL
ALT: 15 U/L — ABNORMAL LOW (ref 17–63)
ANION GAP: 7 (ref 5–15)
AST: 40 U/L (ref 15–41)
Albumin: 4.9 g/dL (ref 3.5–5.0)
Alkaline Phosphatase: 63 U/L (ref 38–126)
BUN: 16 mg/dL (ref 6–20)
CALCIUM: 9.4 mg/dL (ref 8.9–10.3)
CO2: 24 mmol/L (ref 22–32)
Chloride: 108 mmol/L (ref 101–111)
Creatinine, Ser: 0.92 mg/dL (ref 0.61–1.24)
GFR calc non Af Amer: >60 mL/min (ref >60)
Glucose, Bld: 81 mg/dL (ref 65–99)
Potassium: 4.2 mmol/L (ref 3.5–5.1)
SODIUM: 139 mmol/L (ref 135–145)
Total Bilirubin: 4 mg/dL — ABNORMAL HIGH (ref 0.3–1.2)
Total Protein: 7.7 g/dL (ref 6.5–8.1)

## 2015-07-05 LAB — RETICULOCYTES

## 2015-07-05 LAB — CBC WITH DIFFERENTIAL/PLATELET
BASOS PCT: 0 %
Band Neutrophils: 2 %
Basophils Absolute: 0 10*3/uL (ref 0.0–0.1)
EOS ABS: 0.4 10*3/uL (ref 0.0–0.7)
Eosinophils Relative: 2 %
HCT: 21.3 % — ABNORMAL LOW (ref 39.0–52.0)
Hemoglobin: 7.5 g/dL — ABNORMAL LOW (ref 13.0–17.0)
LYMPHS PCT: 11 %
Lymphs Abs: 2.2 10*3/uL (ref 0.7–4.0)
MCH: 33.8 pg (ref 26.0–34.0)
MCHC: 35.2 g/dL (ref 30.0–36.0)
MCV: 95.9 fL (ref 78.0–100.0)
MONO ABS: 1.2 10*3/uL — AB (ref 0.1–1.0)
Metamyelocytes Relative: 1 %
Monocytes Relative: 6 %
NEUTROS ABS: 16.6 10*3/uL — AB (ref 1.7–7.7)
NEUTROS PCT: 78 %
PLATELETS: 385 10*3/uL (ref 150–400)
RBC: 2.22 MIL/uL — AB (ref 4.22–5.81)
RDW: 24.7 % — AB (ref 11.5–15.5)
WBC: 20.4 10*3/uL — AB (ref 4.0–10.5)
nRBC: 19 /100 WBC — ABNORMAL HIGH

## 2015-07-05 LAB — LIPASE, BLOOD: LIPASE: 30 U/L (ref 11–51)

## 2015-07-05 MED ORDER — HEPARIN SODIUM (PORCINE) 5000 UNIT/ML IJ SOLN
5000.0000 [IU] | Freq: Three times a day (TID) | INTRAMUSCULAR | Status: DC
  Administered 2015-07-06 (×2): 5000 [IU] via SUBCUTANEOUS
  Filled 2015-07-05 (×4): qty 1

## 2015-07-05 MED ORDER — DEXTROSE 5 % IV SOLN
500.0000 mg | INTRAVENOUS | Status: DC
  Administered 2015-07-06 – 2015-07-08 (×3): 500 mg via INTRAVENOUS
  Filled 2015-07-05 (×3): qty 500

## 2015-07-05 MED ORDER — DEXTROSE-NACL 5-0.45 % IV SOLN
INTRAVENOUS | Status: DC
  Administered 2015-07-06 – 2015-07-07 (×3): via INTRAVENOUS

## 2015-07-05 MED ORDER — VANCOMYCIN HCL IN DEXTROSE 1-5 GM/200ML-% IV SOLN
1000.0000 mg | Freq: Three times a day (TID) | INTRAVENOUS | Status: DC
  Administered 2015-07-06 (×2): 1000 mg via INTRAVENOUS
  Filled 2015-07-05 (×2): qty 200

## 2015-07-05 MED ORDER — ONDANSETRON HCL 4 MG PO TABS: 4.0000 mg | ORAL_TABLET | ORAL | Status: DC | PRN

## 2015-07-05 MED ORDER — ONDANSETRON HCL 4 MG/2ML IJ SOLN
4.0000 mg | INTRAMUSCULAR | Status: DC | PRN
  Administered 2015-07-06: 4 mg via INTRAVENOUS
  Filled 2015-07-05: qty 2

## 2015-07-05 MED ORDER — HYDROMORPHONE HCL 1 MG/ML IJ SOLN
1.0000 mg | Freq: Once | INTRAMUSCULAR | Status: AC
  Administered 2015-07-05: 1 mg via INTRAVENOUS
  Filled 2015-07-05: qty 1

## 2015-07-05 MED ORDER — HYDROXYUREA 500 MG PO CAPS
500.0000 mg | ORAL_CAPSULE | Freq: Two times a day (BID) | ORAL | Status: DC
  Administered 2015-07-06 (×2): 500 mg via ORAL
  Filled 2015-07-05 (×2): qty 1

## 2015-07-05 MED ORDER — KETOROLAC TROMETHAMINE 15 MG/ML IJ SOLN
15.0000 mg | Freq: Four times a day (QID) | INTRAMUSCULAR | Status: DC
  Administered 2015-07-06 – 2015-07-08 (×10): 15 mg via INTRAVENOUS
  Filled 2015-07-05 (×11): qty 1

## 2015-07-05 MED ORDER — DIPHENHYDRAMINE HCL 12.5 MG/5ML PO ELIX: 12.5000 mg | ORAL_SOLUTION | Freq: Four times a day (QID) | ORAL | Status: DC | PRN

## 2015-07-05 MED ORDER — SODIUM CHLORIDE 0.9 % IV BOLUS (SEPSIS)
1000.0000 mL | Freq: Once | INTRAVENOUS | Status: AC
  Administered 2015-07-05: 1000 mL via INTRAVENOUS

## 2015-07-05 MED ORDER — SENNOSIDES-DOCUSATE SODIUM 8.6-50 MG PO TABS
1.0000 | ORAL_TABLET | Freq: Two times a day (BID) | ORAL | Status: DC
  Administered 2015-07-06 – 2015-07-08 (×5): 1 via ORAL
  Filled 2015-07-05 (×5): qty 1

## 2015-07-05 MED ORDER — FOLIC ACID 1 MG PO TABS
1.0000 mg | ORAL_TABLET | Freq: Every day | ORAL | Status: DC
  Administered 2015-07-06 – 2015-07-08 (×3): 1 mg via ORAL
  Filled 2015-07-05 (×3): qty 1

## 2015-07-05 MED ORDER — DEXTROSE 5 % IV SOLN
1.0000 g | INTRAVENOUS | Status: DC
  Administered 2015-07-06 – 2015-07-07 (×3): 1 g via INTRAVENOUS
  Filled 2015-07-05 (×3): qty 10

## 2015-07-05 MED ORDER — HYDROMORPHONE 1 MG/ML IV SOLN
INTRAVENOUS | Status: DC
  Administered 2015-07-05: via INTRAVENOUS
  Administered 2015-07-06: 1 mg via INTRAVENOUS
  Administered 2015-07-06: 1.2 mg via INTRAVENOUS
  Filled 2015-07-05: qty 25

## 2015-07-05 MED ORDER — POLYETHYLENE GLYCOL 3350 17 G PO PACK: 17.0000 g | PACK | Freq: Every day | ORAL | Status: DC | PRN

## 2015-07-05 MED ORDER — SODIUM CHLORIDE 0.9 % IJ SOLN: 9.0000 mL | INTRAMUSCULAR | Status: DC | PRN

## 2015-07-05 MED ORDER — DIPHENHYDRAMINE HCL 50 MG/ML IJ SOLN: 12.5000 mg | Freq: Four times a day (QID) | INTRAMUSCULAR | Status: DC | PRN

## 2015-07-05 MED ORDER — ONDANSETRON HCL 4 MG/2ML IJ SOLN: 4.0000 mg | Freq: Four times a day (QID) | INTRAMUSCULAR | Status: DC | PRN

## 2015-07-05 MED ORDER — KETOROLAC TROMETHAMINE 30 MG/ML IJ SOLN
30.0000 mg | Freq: Once | INTRAMUSCULAR | Status: AC
  Administered 2015-07-05: 30 mg via INTRAVENOUS
  Filled 2015-07-05: qty 1

## 2015-07-05 MED ORDER — NALOXONE HCL 0.4 MG/ML IJ SOLN: 0.4000 mg | INTRAMUSCULAR | Status: DC | PRN

## 2015-07-05 NOTE — ED Provider Notes (Signed)
CSN: [id number]     Arrival date & time 07/05/15  1632 History   First MD Initiated Contact with Patient 07/05/15 1656     Chief Complaint  Patient presents with  . Sickle Cell Pain Crisis   HPI  Jeffrey Morrison is a 19 year old male with PMHx of sickle cell presenting in pain crisis. Pt states he had acute onset of severe pain in his chest, upper abdomen, flanks, lumbar region and all extremities. Pt is extremely distressed on interview, repeats "it hurts, it hurts". Endorses associated SOB and cough for the past 2-3 days with non-bloody, yellow sputum. States he has felt mildly feverish but has not taken his temrparute. Denies recent sick contacts. Denies chills, diaphoresis, headache, dizziness, syncope, nausea, vomiting diarrhea or numbness/swelling in the extremities   Past Medical History  Diagnosis Date  . Sickle cell crisis ([place])   . Chronic lower back pain    Past Surgical History  Procedure Laterality Date  . Splenectomy     History reviewed. No pertinent family history. Social History  Substance Use Topics  . Smoking status: Current Every Day Smoker -- 0.50 packs/day    Types: Cigarettes  . Smokeless tobacco: None  . Alcohol Use: No    Review of Systems  Constitutional: Positive for fever. Negative for chills and diaphoresis.  Respiratory: Positive for cough and shortness of breath.   Cardiovascular: Positive for chest pain.  Gastrointestinal: Positive for abdominal pain. Negative for nausea, vomiting and diarrhea.  Musculoskeletal: Positive for myalgias, back pain and arthralgias.  Skin: Negative for rash.  Neurological: Negative for dizziness, syncope, numbness and headaches.  All other systems reviewed and are negative.     Allergies  Review of patient's allergies indicates no known allergies.  Home Medications   Prior to Admission medications   Medication Sig Start Date End Date Taking? Authorizing Provider  acetaminophen (TYLENOL) 325 MG tablet Take 650 mg  by mouth every 6 (six) hours as needed for moderate pain.   Yes Historical Provider, MD  folic acid (FOLVITE) 1 MG tablet Take 1 tablet (1 mg total) by mouth daily. 11/16/14   Ripudeep Krystal Eaton, MD  hydroxyurea (HYDREA) 500 MG capsule Take 1 capsule (500 mg total) by mouth daily. May take with food to minimize GI side effects. 11/16/14   Ripudeep Krystal Eaton, MD  ibuprofen (ADVIL,MOTRIN) 200 MG tablet Take 400 mg by mouth every 6 (six) hours as needed for mild pain or moderate pain.    Historical Provider, MD  Menthol-Methyl Salicylate (ICY HOT EXTRA STRENGTH) 10-30 % CREA Apply 1 application topically daily as needed (pain).    Historical Provider, MD  mupirocin ointment (BACTROBAN) 2 % Place 1 application into the nose 2 (two) times daily. Each nare for 10 days Patient not taking: Reported on 07/05/2015 11/16/14   Ripudeep Krystal Eaton, MD  nystatin cream (MYCOSTATIN) Apply topically 2 (two) times daily. Apply to the groin Patient not taking: Reported on 07/05/2015 11/16/14   Ripudeep Krystal Eaton, MD  oxyCODONE-acetaminophen (PERCOCET) 5-325 MG per tablet Take 1 tablet by mouth every 4 (four) hours as needed. 01/04/15   Daleen Bo, MD   BP 111/67 mmHg  Pulse 68  Temp(Src) 98.7 F (37.1 C) (Oral)  Resp 18  Ht 5' 8.9" (1.75 m)  Wt 151 lb 7.3 oz (68.7 kg)  BMI 22.43 kg/m2  SpO2 100% Physical Exam  Constitutional: He appears well-developed and well-nourished. He appears distressed.  HENT:  Head: Normocephalic and atraumatic.  Mouth/Throat:  Oropharynx is clear and moist. No oropharyngeal exudate.  Eyes: Conjunctivae and EOM are normal. Pupils are equal, round, and reactive to light. Right eye exhibits no discharge. Left eye exhibits no discharge. No scleral icterus.  Neck: Normal range of motion.  Cardiovascular: Normal rate, regular rhythm, normal heart sounds and intact distal pulses.   Pedal pulses palpable.   Pulmonary/Chest: Effort normal. No respiratory distress.  Tachpneic. Breathing unlabored. Rhonchus breath  sounds throughout lung fields  Abdominal: Soft. There is tenderness (upper abdomen). There is no rebound and no guarding.  Musculoskeletal: Normal range of motion. He exhibits tenderness. He exhibits no edema.  Moves all extremities spontaneously. Diffuse TTP over extremities. No point tenderness. No edema or deformity.   Neurological: He is alert. No cranial nerve deficit. Coordination normal.  5/5 strength of major muscle groups. Sensation to light touch intact.   Skin: Skin is warm and dry. No rash noted.  Psychiatric: He has a normal mood and affect. His behavior is normal.  Nursing note and vitals reviewed.   ED Course  Procedures (including critical care time) Labs Review Labs Reviewed  COMPREHENSIVE METABOLIC PANEL - Abnormal; Notable for the following:    ALT 15 (*)    Total Bilirubin 4.0 (*)    All other components within normal limits  CBC WITH DIFFERENTIAL/PLATELET - Abnormal; Notable for the following:    WBC 20.4 (*)    RBC 2.22 (*)    Hemoglobin 7.5 (*)    HCT 21.3 (*)    RDW 24.7 (*)    nRBC 19 (*)    Neutro Abs 16.6 (*)    Monocytes Absolute 1.2 (*)    All other components within normal limits  RETICULOCYTES - Abnormal; Notable for the following:    Retic Ct Pct >23.0 (*)    All other components within normal limits  COMPREHENSIVE METABOLIC PANEL - Abnormal; Notable for the following:    Sodium 133 (*)    Glucose, Bld 159 (*)    Calcium 8.5 (*)    ALT 13 (*)    Total Bilirubin 3.0 (*)    All other components within normal limits  LACTATE DEHYDROGENASE - Abnormal; Notable for the following:    LDH 411 (*)    All other components within normal limits  CBC WITH DIFFERENTIAL/PLATELET - Abnormal; Notable for the following:    WBC 20.7 (*)    RBC 1.88 (*)    Hemoglobin 6.5 (*)    HCT 18.1 (*)    MCH 34.6 (*)    RDW 24.8 (*)    nRBC 20 (*)    Neutro Abs 15.9 (*)    Monocytes Absolute 2.9 (*)    All other components within normal limits  RETICULOCYTES -  Abnormal; Notable for the following:    Retic Ct Pct >23.0 (*)    RBC. 1.88 (*)    All other components within normal limits  CULTURE, BLOOD (ROUTINE X 2)  CULTURE, BLOOD (ROUTINE X 2)  CULTURE, EXPECTORATED SPUTUM-ASSESSMENT  GRAM STAIN  LIPASE, BLOOD  MAGNESIUM  TROPONIN I  CBC  HIV ANTIBODY (ROUTINE TESTING)  LEGIONELLA PNEUMOPHILA SEROGP 1 UR AG  STREP PNEUMONIAE URINARY ANTIGEN    Imaging Review Dg Chest 2 View  07/05/2015  CLINICAL DATA:  Sickle cell pain crisis. Chest pain. Cough and fever, onset today. EXAM: CHEST  2 VIEW COMPARISON:  09/10/2014 FINDINGS: New ill-defined right infrahilar opacity. Cardiomediastinal contours are unchanged, heart at the upper limits of normal in size. No pulmonary  edema, pleural effusion or pneumothorax. No acute osseous abnormalities. IMPRESSION: Right infrahilar opacity, may reflect pneumonia or acute chest. Electronically Signed   By: Jeb Levering M.D.   On: 07/05/2015 18:18   I have personally reviewed and evaluated these images and lab results as part of my medical decision-making.   EKG Interpretation None      MDM   Final diagnoses:  Sickle cell pain crisis ([place])   Pt presenting with sickle cell pain. Pain located over chest, back, abdomen and extremities. Pt also endorsing SOB, productive cough and fevers. On exam, pt very uncomfortable and tearful. Rhonchi in all lung fields. CXR concerning for ACS. WBC 20.7. Hgb 7.5 which appears to be near his baseline. Attempted to control pain in ED with dilaudid x 2 and toradol. Pt reports the pain comes back within 1-2 hours after each dose. Consulted Dr. Humphrey Rolls for admission for possible ACS and uncontrolled sickle cell pain. Dr. Humphrey Rolls agrees and will admit.   Josephina Gip, PA-C 07/06/15 [place], MD 07/07/15 [phone]

## 2015-07-05 NOTE — ED Notes (Signed)
PA at bedside. Pt complaining of pain returning

## 2015-07-05 NOTE — ED Notes (Signed)
Pt stated "it started a couple a minutes ago.  My side, back & chest hurt all over."

## 2015-07-05 NOTE — ED Notes (Signed)
Pt reports L side pain today, states that this is not his usual pain when he has a crisis.  No n/v at this time.

## 2015-07-05 NOTE — ED Notes (Signed)
Pt made aware of urine sample. Unable to void

## 2015-07-05 NOTE — ED Notes (Signed)
Per EMS-sickle cell pain that started for an hour, out of meds-abdominal pain and back pain

## 2015-07-05 NOTE — Progress Notes (Signed)
ANTIBIOTIC CONSULT NOTE - INITIAL  Pharmacy Consult for Vancomycin Indication: pneumonia  No Known Allergies  Patient Measurements: Height: 5' 8.9" (175 cm) Weight: 160 lb (72.576 kg) IBW/kg (Calculated) : 70.47  Vital Signs: Temp: 98.5 F (36.9 C) (10/20 1647) Temp Source: Oral (10/20 1647) BP: 109/54 mmHg (10/20 2151) Pulse Rate: 75 (10/20 2151) Intake/Output from previous day:   Intake/Output from this shift:    Labs:  Recent Labs  07/05/15 1704  WBC 20.4*  HGB 7.5*  PLT 385  CREATININE 0.92   Estimated Creatinine Clearance: 126.7 mL/min (by C-G formula based on Cr of 0.92). No results for input(s): VANCOTROUGH, VANCOPEAK, VANCORANDOM, GENTTROUGH, GENTPEAK, GENTRANDOM, TOBRATROUGH, TOBRAPEAK, TOBRARND, AMIKACINPEAK, AMIKACINTROU, AMIKACIN in the last 72 hours.   Microbiology: No results found for this or any previous visit (from the past 720 hour(s)).  Medical History: Past Medical History  Diagnosis Date  . Sickle cell crisis ([place])   . Chronic lower back pain     Medications:  Scheduled:  . azithromycin  500 mg Intravenous Q24H  . cefTRIAXone (ROCEPHIN)  IV  1 g Intravenous Q24H  . [START ON 03/26/1974] folic acid  1 mg Oral Daily  . [START ON 07/06/2015] heparin  5,000 Units Subcutaneous 3 times per day  . [START ON 07/06/2015] HYDROmorphone   Intravenous 6 times per day  . [START ON 07/06/2015] hydroxyurea  500 mg Oral BID  . [START ON 07/06/2015] ketorolac  15 mg Intravenous 4 times per day  . [START ON 07/06/2015] senna-docusate  1 tablet Oral BID  . [START ON 07/06/2015] vancomycin  1,000 mg Intravenous Q8H   Infusions:  . dextrose 5 % and 0.45% NaCl     Assessment:  19 yr male with h/o sickle cell presents with chest and back pain, cough and shortness of breath  Afebrile, elevated WBC  Chest Xray shows acute chest vs pneumonia  Blood and sputum cultures ordered  CrCl > 120 ml/min  Pharmacy consulted to dose Vancomycin for  pneumonia  MD has also ordered Ceftriaxone and Azithromycin, requesting pharmacy adjust dosages as needed  10/21 >>Vanc >> 10/21 >>Azith >>  10/21 >> Ceftriaxone >>   10/21 blood: 10/21 sputum:   Trough/Dose change info:  Goal of Therapy:  Vancomycin trough level 15-20 mcg/ml  Plan:  Measure antibiotic drug levels at steady state Follow up culture results  Vancomycin 1gm IV q8h Continue Ceftriaxone 1gm IV q24h per MD Continue Azithromycin 500mg  IV q24h per MD  Jad Johansson, [place], PharmD 07/05/2015,11:56 PM

## 2015-07-05 NOTE — ED Notes (Signed)
Pt aware that a urine sample was needed. Pt sts he has been unable to urinate at this time.

## 2015-07-05 NOTE — H&P (Signed)
[facility] History and Physical  [name]:[id number] DOB: [birth year] DOA: 07/05/2015  Referring physician: Josephina Gip PA PCP: No PCP Per Patient   Chief Complaint: Pain Crisis  HPI: Jeffrey Morrison is a 19 y.o. male with history of sickle cell disease presents with pain crisis. Patient states that he was doing fine until the last 2 hours. He states that he started having pain all over his body. He states that he is having pain in his chest area back pain and sides. Patient has also been experiencing shortness of breath also. He states that he has had a cough with some sputum production. He states that it is yellow in color. Patient has had no hemoptysis. He has felt feverish but has not actually checked his temperature. He states that has no syncope noted. He has not noted any edema.   Review of Systems:  Complete ROS performed and is unremarkable other than HPI.   Past Medical History  Diagnosis Date  . Sickle cell crisis ([place])   . Chronic lower back pain    Past Surgical History  Procedure Laterality Date  . Splenectomy     Social History:  reports that he has been smoking Cigarettes.  He has been smoking about 0.50 packs per day. He does not have any smokeless tobacco history on file. He reports that he does not drink alcohol or use illicit drugs.  No Known Allergies  No family history on file.   Prior to Admission medications   Medication Sig Start Date End Date Taking? Authorizing Provider  acetaminophen (TYLENOL) 325 MG tablet Take 650 mg by mouth every 6 (six) hours as needed for moderate pain.   Yes Historical Provider, MD  folic acid (FOLVITE) 1 MG tablet Take 1 tablet (1 mg total) by mouth daily. 11/16/14   Ripudeep Krystal Eaton, MD  hydroxyurea (HYDREA) 500 MG capsule Take 1 capsule (500 mg total) by mouth daily. May take with food to minimize GI side effects. 11/16/14   Ripudeep Krystal Eaton, MD  ibuprofen (ADVIL,MOTRIN) 200 MG tablet Take 400 mg by mouth every 6  (six) hours as needed for mild pain or moderate pain.    Historical Provider, MD  Menthol-Methyl Salicylate (ICY HOT EXTRA STRENGTH) 10-30 % CREA Apply 1 application topically daily as needed (pain).    Historical Provider, MD  mupirocin ointment (BACTROBAN) 2 % Place 1 application into the nose 2 (two) times daily. Each nare for 10 days Patient not taking: Reported on 07/05/2015 11/16/14   Ripudeep Krystal Eaton, MD  nystatin cream (MYCOSTATIN) Apply topically 2 (two) times daily. Apply to the groin Patient not taking: Reported on 07/05/2015 11/16/14   Ripudeep Krystal Eaton, MD  oxyCODONE-acetaminophen (PERCOCET) 5-325 MG per tablet Take 1 tablet by mouth every 4 (four) hours as needed. 01/04/15   Daleen Bo, MD   Physical Exam: Filed Vitals:   07/05/15 1647 07/05/15 1738 07/05/15 1852 07/05/15 2151  BP: 130/70  104/51 109/54  Pulse: 97  93 75  Temp: 98.5 F (36.9 C)     TempSrc: Oral     Resp: 20  20 22   Weight:  72.576 kg (160 lb)    SpO2: 99%  95% 90%    Wt Readings from Last 3 Encounters:  07/05/15 72.576 kg (160 lb)  01/04/15 74.844 kg (165 lb)  11/12/14 73.936 kg (163 lb)    General:  Appears anxious and uncomfortable Eyes: PERRL, normal lids ENT: grossly normal hearing, lips & tongue Neck:  no LAD, masses or thyromegaly Cardiovascular: RRR, no m/r/g. No LE edema  Respiratory:  Rhonchi bilaterally, no rales. Abdomen: soft, ntnd Skin: dry likely has tinea Musculoskeletal: grossly normal tone BUE/BLE Psychiatric: grossly anxious mood Neurologic: non-focal on gross motor exam. Gait not checked          Labs on Admission:  Basic Metabolic Panel:  Recent Labs Lab 07/05/15 1704  NA 139  K 4.2  CL 108  CO2 24  GLUCOSE 81  BUN 16  CREATININE 0.92  CALCIUM 9.4   Liver Function Tests:  Recent Labs Lab 07/05/15 1704  AST 40  ALT 15*  ALKPHOS 63  BILITOT 4.0*  PROT 7.7  ALBUMIN 4.9    Recent Labs Lab 07/05/15 1704  LIPASE 30   No results for input(s): AMMONIA in the  last 168 hours. CBC:  Recent Labs Lab 07/05/15 1704  WBC 20.4*  NEUTROABS 16.6*  HGB 7.5*  HCT 21.3*  MCV 95.9  PLT 385   Cardiac Enzymes: No results for input(s): CKTOTAL, CKMB, CKMBINDEX, TROPONINI in the last 168 hours.  BNP (last 3 results) No results for input(s): BNP in the last 8760 hours.  ProBNP (last 3 results) No results for input(s): PROBNP in the last 8760 hours.  CBG: No results for input(s): GLUCAP in the last 168 hours.  Radiological Exams on Admission: Dg Chest 2 View  07/05/2015  CLINICAL DATA:  Sickle cell pain crisis. Chest pain. Cough and fever, onset today. EXAM: CHEST  2 VIEW COMPARISON:  09/10/2014 FINDINGS: New ill-defined right infrahilar opacity. Cardiomediastinal contours are unchanged, heart at the upper limits of normal in size. No pulmonary edema, pleural effusion or pneumothorax. No acute osseous abnormalities. IMPRESSION: Right infrahilar opacity, may reflect pneumonia or acute chest. Electronically Signed   By: Jeb Levering M.D.   On: 07/05/2015 18:18      Assessment/Plan Active Problems:   Sickle cell crisis ([place])   Acute chest syndrome in sickle crisis ([place])   CAP (community acquired pneumonia)   1. Acute Sickle Cell Crisis with possible chest syndrome -will admit to telemetry -started on oxygen therapy -cultures ordered -check retic count -will check a STAT ABG -will start on PCA Pump -start on hydroxyurea -transfuse as needed  -will start on bronchodilators  2. 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

## 2015-07-05 NOTE — ED Notes (Signed)
Bed: FS14 Expected date:  Expected time:  Means of arrival:  Comments: Hold for triage

## 2015-07-06 ENCOUNTER — Encounter (HOSPITAL_COMMUNITY): Payer: Self-pay | Admitting: *Deleted

## 2015-07-06 DIAGNOSIS — J189 Pneumonia, unspecified organism: Secondary | ICD-10-CM

## 2015-07-06 DIAGNOSIS — D72829 Elevated white blood cell count, unspecified: Secondary | ICD-10-CM

## 2015-07-06 DIAGNOSIS — D5700 Hb-SS disease with crisis, unspecified: Secondary | ICD-10-CM

## 2015-07-06 LAB — CBC WITH DIFFERENTIAL/PLATELET
BASOS PCT: 0 %
Basophils Absolute: 0 10*3/uL (ref 0.0–0.1)
EOS PCT: 0 %
Eosinophils Absolute: 0 10*3/uL (ref 0.0–0.7)
HEMATOCRIT: 18.1 % — AB (ref 39.0–52.0)
Hemoglobin: 6.5 g/dL — CL (ref 13.0–17.0)
LYMPHS ABS: 1.9 10*3/uL (ref 0.7–4.0)
Lymphocytes Relative: 9 %
MCH: 34.6 pg — AB (ref 26.0–34.0)
MCHC: 35.9 g/dL (ref 30.0–36.0)
MCV: 96.3 fL (ref 78.0–100.0)
Monocytes Absolute: 2.9 10*3/uL — ABNORMAL HIGH (ref 0.1–1.0)
Monocytes Relative: 14 %
NEUTROS ABS: 15.9 10*3/uL — AB (ref 1.7–7.7)
Neutrophils Relative %: 77 %
Platelets: 329 10*3/uL (ref 150–400)
RBC: 1.88 MIL/uL — ABNORMAL LOW (ref 4.22–5.81)
RDW: 24.8 % — AB (ref 11.5–15.5)
WBC: 20.7 10*3/uL — AB (ref 4.0–10.5)
nRBC: 20 /100 WBC — ABNORMAL HIGH

## 2015-07-06 LAB — COMPREHENSIVE METABOLIC PANEL
ALBUMIN: 4.2 g/dL (ref 3.5–5.0)
ALK PHOS: 55 U/L (ref 38–126)
ALT: 13 U/L — ABNORMAL LOW (ref 17–63)
ANION GAP: 6 (ref 5–15)
AST: 39 U/L (ref 15–41)
BILIRUBIN TOTAL: 3 mg/dL — AB (ref 0.3–1.2)
BUN: 14 mg/dL (ref 6–20)
CALCIUM: 8.5 mg/dL — AB (ref 8.9–10.3)
CO2: 24 mmol/L (ref 22–32)
Chloride: 103 mmol/L (ref 101–111)
Creatinine, Ser: 0.86 mg/dL (ref 0.61–1.24)
GFR calc Af Amer: >60 mL/min (ref >60)
GLUCOSE: 159 mg/dL — AB (ref 65–99)
Potassium: 4 mmol/L (ref 3.5–5.1)
Sodium: 133 mmol/L — ABNORMAL LOW (ref 135–145)
TOTAL PROTEIN: 7 g/dL (ref 6.5–8.1)

## 2015-07-06 LAB — RETICULOCYTES
RBC.: 1.88 MIL/uL — ABNORMAL LOW (ref 4.22–5.81)
Retic Ct Pct: >23 % — ABNORMAL HIGH (ref 0.4–3.1)

## 2015-07-06 LAB — LACTATE DEHYDROGENASE: LDH: 411 U/L — AB (ref 98–192)

## 2015-07-06 LAB — STREP PNEUMONIAE URINARY ANTIGEN: Strep Pneumo Urinary Antigen: NEGATIVE

## 2015-07-06 LAB — MAGNESIUM: Magnesium: 2 mg/dL (ref 1.7–2.4)

## 2015-07-06 LAB — TROPONIN I: Troponin I: <0.03 ng/mL (ref <0.031)

## 2015-07-06 LAB — MRSA PCR SCREENING: MRSA BY PCR: NEGATIVE

## 2015-07-06 LAB — HIV ANTIBODY (ROUTINE TESTING W REFLEX): HIV SCREEN 4TH GENERATION: NONREACTIVE

## 2015-07-06 MED ORDER — NALOXONE HCL 0.4 MG/ML IJ SOLN: 0.4000 mg | INTRAMUSCULAR | Status: DC | PRN

## 2015-07-06 MED ORDER — ONDANSETRON HCL 4 MG/2ML IJ SOLN: 4.0000 mg | Freq: Four times a day (QID) | INTRAMUSCULAR | Status: DC | PRN

## 2015-07-06 MED ORDER — HYDROMORPHONE HCL 1 MG/ML IJ SOLN: 0.5000 mg | Freq: Once | INTRAMUSCULAR | Status: AC

## 2015-07-06 MED ORDER — DIPHENHYDRAMINE HCL 25 MG PO CAPS: 25.0000 mg | ORAL_CAPSULE | Freq: Four times a day (QID) | ORAL | Status: DC | PRN

## 2015-07-06 MED ORDER — SODIUM CHLORIDE 0.9 % IJ SOLN: 9.0000 mL | INTRAMUSCULAR | Status: DC | PRN

## 2015-07-06 MED ORDER — HYDROMORPHONE 1 MG/ML IV SOLN
INTRAVENOUS | Status: DC
Start: 2015-07-06 — End: 2015-07-08
  Administered 2015-07-06: 1.6 mg via INTRAVENOUS
  Administered 2015-07-06: 2.6 mg via INTRAVENOUS
  Administered 2015-07-06: 5.5 mg via INTRAVENOUS
  Administered 2015-07-06: 1.2 mg via INTRAVENOUS
  Administered 2015-07-06: 4.8 mg via INTRAVENOUS
  Administered 2015-07-07 (×2): 1.2 mg via INTRAVENOUS
  Administered 2015-07-07: 3.6 mg via INTRAVENOUS
  Administered 2015-07-07: 3 mg via INTRAVENOUS
  Administered 2015-07-07: 08:00:00 via INTRAVENOUS
  Administered 2015-07-07: 1.2 mg via INTRAVENOUS
  Administered 2015-07-08: 4.3 mg via INTRAVENOUS
  Administered 2015-07-08 (×2): 3 mg via INTRAVENOUS
  Administered 2015-07-08: 4.2 mg via INTRAVENOUS
  Filled 2015-07-06: qty 25

## 2015-07-06 MED ORDER — SODIUM CHLORIDE 0.9 % IV SOLN
25.0000 mg | INTRAVENOUS | Status: DC | PRN
  Administered 2015-07-08: 25 mg via INTRAVENOUS
  Filled 2015-07-06 (×3): qty 0.5

## 2015-07-06 MED ORDER — INFLUENZA VAC SPLIT QUAD 0.5 ML IM SUSY
0.5000 mL | PREFILLED_SYRINGE | INTRAMUSCULAR | Status: AC
  Administered 2015-07-07: 0.5 mL via INTRAMUSCULAR
  Filled 2015-07-06 (×2): qty 0.5

## 2015-07-06 NOTE — Progress Notes (Signed)
CRITICAL VALUE ALERT  Critical value received:  Hemoglobin 6.5  Date of notification:  07/06/2015  Time of notification:  0321  Critical value read back:Yes.    Nurse who received alert:  L. Vergel de Larkin Ina RN  MD notified (1st page):  Lamar Blinks NP  Time of first page:  [phone]  MD notified (2nd page):  Time of second page:  Responding MD:  Lamar Blinks NP  Time MD responded:  [phone]

## 2015-07-06 NOTE — Progress Notes (Signed)
SICKLE CELL SERVICE PROGRESS NOTE  Jeffrey Morrison ZCH:[id number] DOB: 05-28-1996 DOA: 07/05/2015 PCP: No PCP Per Patient  Assessment/Plan: Active Problems:   Sickle cell crisis ([place])   Acute chest syndrome in sickle crisis ([place])   CAP (community acquired pneumonia)   Sickle cell crisis acute chest syndrome ([place])  1. CAP: Continue Rocephin and Azithromycin. Discontinue Vancomycin. Encourage incentive spirometer and supportive Oxygen.  2. Hb SS with crisis: Pt was on reduced dose PCA. Will change to a weight based PCA. Continue Toradol and IVf. Re-assess pain in the morning. 3. Leukocytosis: Likely related to pneumonia and crisis. 4. Anemia of chronic disease: Pt does not know his baseline Hb but clearly has a tolerated pneumonia. 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 He need to follow up with primary care and a Hematologist in the ambulatory setting. He is not currently on Hydrea.   Code Status: Full Code Family Communication: N/A Disposition Plan: Not yet ready for discharge  [place].  Pager [phone]. If 7PM-7AM, please contact night-coverage.  07/06/2015, 9:48 AM  LOS: 1 day    Consultants:  None  Procedures:  None  Antibiotics:  Rocephin 10/20 >>  Azithromycin 10/20 >>  Vancomycin 10/20 >>10/21  HPI/Subjective: Pt states that current regimen not working. Pain currently at 8/10  Objective: Filed Vitals:   07/06/15 0400 07/06/15 0622 07/06/15 0745 07/06/15 0800  BP:  106/52 114/68   Pulse:  76 64   Temp:  98.3 F (36.8 C) 99.3 F (37.4 C)   TempSrc:  Axillary Oral   Resp: 16 20 20 19   Height:      Weight:      SpO2: 98% 94% 97% 95%   Weight change:   Intake/Output Summary (Last 24 hours) at 07/06/15 0948 Last data filed at 07/06/15 0600  Gross per 24 hour  Intake 794.17 ml  Output      0 ml  Net 794.17 ml    General: Alert, awake, oriented x3, in mild distress.  HEENT: [place]/AT PEERL, EOMI, mild icterus Neck: Trachea midline,   no masses, no thyromegal,y no JVD, no carotid bruit OROPHARYNX:  Moist, No exudate/ erythema/lesions.  Heart: Regular rate and rhythm, without murmurs, rubs, gallops, PMI non-displaced, no heaves or thrills on palpation.  Lungs: Clear to auscultation, no wheezing or rhonchi noted. No increased vocal fremitus resonant to percussion  Abdomen: Soft, nontender, nondistended, positive bowel sounds, no masses no hepatosplenomegaly noted.  Neuro: No focal neurological deficits noted cranial nerves II through XII grossly intact.  Strength at baseline in bilateral upper and lower extremities. Musculoskeletal: No warm swelling or erythema around joints, no spinal tenderness noted. Psychiatric: Patient alert and oriented x3, good insight and cognition, good recent to remote recall.    Data Reviewed: Basic Metabolic Panel:  Recent Labs Lab 07/05/15 1704 07/06/15 0155  NA 139 133*  K 4.2 4.0  CL 108 103  CO2 24 24  GLUCOSE 81 159*  BUN 16 14  CREATININE 0.92 0.86  CALCIUM 9.4 8.5*  MG  --  2.0   Liver Function Tests:  Recent Labs Lab 07/05/15 1704 07/06/15 0155  AST 40 39  ALT 15* 13*  ALKPHOS 63 55  BILITOT 4.0* 3.0*  PROT 7.7 7.0  ALBUMIN 4.9 4.2    Recent Labs Lab 07/05/15 1704  LIPASE 30   No results for input(s): AMMONIA in the last 168 hours. CBC:  Recent Labs Lab 07/05/15 1704 07/06/15 0155  WBC 20.4* 20.7*  NEUTROABS 16.6* 15.9*  HGB 7.5* 6.5*  HCT 21.3* 18.1*  MCV 95.9 96.3  PLT 385 329   Cardiac Enzymes:  Recent Labs Lab 07/06/15 0155  TROPONINI <0.03   BNP (last 3 results) No results for input(s): BNP in the last 8760 hours.  ProBNP (last 3 results) No results for input(s): PROBNP in the last 8760 hours.  CBG: No results for input(s): GLUCAP in the last 168 hours.  Recent Results (from the past 240 hour(s))  Culture, blood (routine x 2)     Status: None (Preliminary result)   Collection Time: 07/06/15  1:47 AM  Result Value Ref Range  Status   Specimen Description BLOOD RIGHT ARM  Final   Special Requests   Final    BOTTLES DRAWN AEROBIC AND ANAEROBIC 10CC ANA [place] AER Performed at [facility]    Culture PENDING  Incomplete   Report Status PENDING  Incomplete  Culture, blood (routine x 2)     Status: None (Preliminary result)   Collection Time: 07/06/15  1:52 AM  Result Value Ref Range Status   Specimen Description BLOOD RIGHT ARM  Final   Special Requests   Final    BOTTLES DRAWN AEROBIC AND ANAEROBIC 8ML ANA 7ML AER Performed at [facility]    Culture PENDING  Incomplete   Report Status PENDING  Incomplete     Studies: Dg Chest 2 View  07/05/2015  CLINICAL DATA:  Sickle cell pain crisis. Chest pain. Cough and fever, onset today. EXAM: CHEST  2 VIEW COMPARISON:  09/10/2014 FINDINGS: New ill-defined right infrahilar opacity. Cardiomediastinal contours are unchanged, heart at the upper limits of normal in size. No pulmonary edema, pleural effusion or pneumothorax. No acute osseous abnormalities. IMPRESSION: Right infrahilar opacity, may reflect pneumonia or acute chest. Electronically Signed   By: Jeb Levering M.D.   On: 07/05/2015 18:18    Scheduled Meds: . azithromycin  500 mg Intravenous Q24H  . cefTRIAXone (ROCEPHIN)  IV  1 g Intravenous Q24H  . folic acid  1 mg Oral Daily  . heparin  5,000 Units Subcutaneous 3 times per day  . HYDROmorphone   Intravenous 6 times per day  . hydroxyurea  500 mg Oral BID  . [START ON 07/07/2015] Influenza vac split quadrivalent PF  0.5 mL Intramuscular Tomorrow-1000  . ketorolac  15 mg Intravenous 4 times per day  . senna-docusate  1 tablet Oral BID   Continuous Infusions: . dextrose 5 % and 0.45% NaCl 50 mL/hr at 07/06/15 0007    Time spent 30 minutes.

## 2015-07-07 DIAGNOSIS — D5701 Hb-SS disease with acute chest syndrome: Principal | ICD-10-CM

## 2015-07-07 LAB — CBC WITH DIFFERENTIAL/PLATELET
BASOS ABS: 0 10*3/uL (ref 0.0–0.1)
Basophils Relative: 0 %
EOS ABS: 0.3 10*3/uL (ref 0.0–0.7)
Eosinophils Relative: 2 %
HEMATOCRIT: 18.8 % — AB (ref 39.0–52.0)
Hemoglobin: 6.6 g/dL — CL (ref 13.0–17.0)
LYMPHS ABS: 1.9 10*3/uL (ref 0.7–4.0)
Lymphocytes Relative: 11 %
MCH: 34 pg (ref 26.0–34.0)
MCHC: 35.1 g/dL (ref 30.0–36.0)
MCV: 96.9 fL (ref 78.0–100.0)
MONO ABS: 1.7 10*3/uL — AB (ref 0.1–1.0)
Monocytes Relative: 10 %
NEUTROS ABS: 13.5 10*3/uL — AB (ref 1.7–7.7)
NEUTROS PCT: 77 %
PLATELETS: 313 10*3/uL (ref 150–400)
RBC: 1.94 MIL/uL — AB (ref 4.22–5.81)
RDW: 21.6 % — AB (ref 11.5–15.5)
WBC: 17.4 10*3/uL — AB (ref 4.0–10.5)
nRBC: 14 /100 WBC — ABNORMAL HIGH

## 2015-07-07 LAB — LEGIONELLA PNEUMOPHILA SEROGP 1 UR AG: L. PNEUMOPHILA SEROGP 1 UR AG: NEGATIVE

## 2015-07-07 LAB — EXPECTORATED SPUTUM ASSESSMENT W GRAM STAIN, RFLX TO RESP C

## 2015-07-07 LAB — EXPECTORATED SPUTUM ASSESSMENT W REFEX TO RESP CULTURE

## 2015-07-07 NOTE — Progress Notes (Signed)
SICKLE CELL SERVICE PROGRESS NOTE  Jeffrey Morrison OIN:[id number] DOB: Nov 09, 1995 DOA: 07/05/2015 PCP: No PCP Per Patient  Assessment/Plan: Active Problems:   Sickle cell crisis ([place])   Acute chest syndrome in sickle crisis ([place])   CAP (community acquired pneumonia)   Sickle cell crisis acute chest syndrome ([place])  1. CAP: Much better. Continue Rocephin and Azithromycin. Encourage incentive spirometer and supportive Oxygen.  2. Hb SS with crisis: Pt was on reduced dose PCA. Will change to a weight based PCA. Continue Toradol and IVF. Re-assess pain in the morning. 3. Leukocytosis: Likely related to pneumonia and crisis. 4. Anemia of chronic disease: Pt has Hb of 6.6. He is not currently on Hydrea.   Code Status: Full Code Family Communication: N/A Disposition Plan: Not yet ready for discharge  [facility]  Pager [phone]. If 7PM-7AM, please contact night-coverage.  07/07/2015, 5:53 PM  LOS: 2 days    Consultants:  None  Procedures:  None  Antibiotics:  Rocephin 10/20 >>  Azithromycin 10/20 >>  Vancomycin 10/20 >>10/21  HPI/Subjective: Pt doing better. Still having pain pain is not getting better. Pain currently at 7/10.  Objective: Filed Vitals:   07/07/15 1226 07/07/15 1404 07/07/15 1634 07/07/15 1716  BP:  112/51  146/48  Pulse:      Temp:  98.4 F (36.9 C)  99.8 F (37.7 C)  TempSrc:  Oral  Oral  Resp: 16 16 16 14   Height:      Weight:      SpO2: 95% 96% 96% 95%   Weight change: 2.722 kg (6 lb)  Intake/Output Summary (Last 24 hours) at 07/07/15 1753 Last data filed at 07/07/15 1300  Gross per 24 hour  Intake 2027.93 ml  Output      0 ml  Net 2027.93 ml    General: Alert, awake, oriented x3, in mild distress.  HEENT: [place]/AT PEERL, EOMI, mild icterus Neck: Trachea midline,  no masses, no thyromegal,y no JVD, no carotid bruit OROPHARYNX:  Moist, No exudate/ erythema/lesions.  Heart: Regular rate and rhythm, without murmurs, rubs, gallops, PMI  non-displaced, no heaves or thrills on palpation.  Lungs: Clear to auscultation, no wheezing or rhonchi noted. No increased vocal fremitus resonant to percussion  Abdomen: Soft, nontender, nondistended, positive bowel sounds, no masses no hepatosplenomegaly noted.  Neuro: No focal neurological deficits noted cranial nerves II through XII grossly intact.  Strength at baseline in bilateral upper and lower extremities. Musculoskeletal: No warm swelling or erythema around joints, no spinal tenderness noted. Psychiatric: Patient alert and oriented x3, good insight and cognition, good recent to remote recall.    Data Reviewed: Basic Metabolic Panel:  Recent Labs Lab 07/05/15 1704 07/06/15 0155  NA 139 133*  K 4.2 4.0  CL 108 103  CO2 24 24  GLUCOSE 81 159*  BUN 16 14  CREATININE 0.92 0.86  CALCIUM 9.4 8.5*  MG  --  2.0   Liver Function Tests:  Recent Labs Lab 07/05/15 1704 07/06/15 0155  AST 40 39  ALT 15* 13*  ALKPHOS 63 55  BILITOT 4.0* 3.0*  PROT 7.7 7.0  ALBUMIN 4.9 4.2    Recent Labs Lab 07/05/15 1704  LIPASE 30   No results for input(s): AMMONIA in the last 168 hours. CBC:  Recent Labs Lab 07/05/15 1704 07/06/15 0155 07/07/15 0543  WBC 20.4* 20.7* 17.4*  NEUTROABS 16.6* 15.9* 13.5*  HGB 7.5* 6.5* 6.6*  HCT 21.3* 18.1* 18.8*  MCV 95.9 96.3 96.9  PLT 385 329 313  Cardiac Enzymes:  Recent Labs Lab 07/06/15 0155  TROPONINI <0.03   BNP (last 3 results) No results for input(s): BNP in the last 8760 hours.  ProBNP (last 3 results) No results for input(s): PROBNP in the last 8760 hours.  CBG: No results for input(s): GLUCAP in the last 168 hours.  Recent Results (from the past 240 hour(s))  Culture, blood (routine x 2)     Status: None (Preliminary result)   Collection Time: 07/06/15  1:47 AM  Result Value Ref Range Status   Specimen Description BLOOD RIGHT ARM  Final   Special Requests   Final    BOTTLES DRAWN AEROBIC AND ANAEROBIC 10CC ANA  [place] [facility]   Culture   Final    NO GROWTH 1 DAY Performed at [facility]    Report Status PENDING  Incomplete  Culture, blood (routine x 2)     Status: None (Preliminary result)   Collection Time: 07/06/15  1:52 AM  Result Value Ref Range Status   Specimen Description BLOOD RIGHT ARM  Final   Special Requests   Final    BOTTLES DRAWN AEROBIC AND ANAEROBIC 8ML ANA 7ML AER   Culture   Final    NO GROWTH 1 DAY Performed at [facility]    Report Status PENDING  Incomplete  MRSA PCR Screening     Status: None   Collection Time: 07/06/15  2:20 PM  Result Value Ref Range Status   MRSA by PCR NEGATIVE NEGATIVE Final    Comment:        The GeneXpert MRSA Assay (FDA approved for NASAL specimens only), is one component of a comprehensive MRSA colonization surveillance program. It is not intended to diagnose MRSA infection nor to guide or monitor treatment for MRSA infections.   Sputum culture     Status: None   Collection Time: 07/07/15  1:10 AM  Result Value Ref Range Status   Specimen Description SPUTUM  Final   Special Requests NONE  Final   Sputum evaluation   Final    THIS SPECIMEN IS ACCEPTABLE. RESPIRATORY CULTURE REPORT TO FOLLOW.   Report Status 07/07/2015 FINAL  Final     Studies: Dg Chest 2 View  07/05/2015  CLINICAL DATA:  Sickle cell pain crisis. Chest pain. Cough and fever, onset today. EXAM: CHEST  2 VIEW COMPARISON:  09/10/2014 FINDINGS: New ill-defined right infrahilar opacity. Cardiomediastinal contours are unchanged, heart at the upper limits of normal in size. No pulmonary edema, pleural effusion or pneumothorax. No acute osseous abnormalities. IMPRESSION: Right infrahilar opacity, may reflect pneumonia or acute chest. Electronically Signed   By: Jeb Levering M.D.   On: 07/05/2015 18:18    Scheduled Meds: . azithromycin  500 mg Intravenous Q24H  . cefTRIAXone (ROCEPHIN)  IV  1 g Intravenous Q24H  . folic acid  1 mg Oral Daily  . heparin   5,000 Units Subcutaneous 3 times per day  . HYDROmorphone   Intravenous 6 times per day  . ketorolac  15 mg Intravenous 4 times per day  . senna-docusate  1 tablet Oral BID   Continuous Infusions: . dextrose 5 % and 0.45% NaCl 50 mL/hr at 07/06/15 2118    Time spent 30 minutes.

## 2015-07-08 MED ORDER — LEVOFLOXACIN 750 MG PO TABS: 750.0000 mg | ORAL_TABLET | Freq: Every day | ORAL | Status: DC

## 2015-07-08 MED ORDER — OXYCODONE-ACETAMINOPHEN 5-325 MG PO TABS: 1.0000 | ORAL_TABLET | ORAL | Status: DC | PRN

## 2015-07-08 NOTE — Plan of Care (Signed)
Problem: Phase I Progression Outcomes Goal: Pulmonary Hygiene as Indicated (Sickle Cell) Outcome: Progressing Incentive spirometer achieved 1650

## 2015-07-08 NOTE — Progress Notes (Signed)
Discharge orders explained to pt and script given for Levaquin. Iv dc'd. Pt asked what he was to take for pain at home. States he didn't ask Dr. Jonelle Sidle about this. Dr. Jonelle Sidle called and informed of situation. Dr. Jonelle Sidle states he needs to get prescription for pain from his PCP. Also that he can stay until tommorrow when he could call his PCP and get a script. Pt states he doesn't have PCP. Pt states he will stay until tommorrow, Dr. Jonelle Sidle called, he ordered Percocet for pain because IV was dc'd.

## 2015-07-08 NOTE — Discharge Summary (Signed)
Physician Discharge Summary  Patient ID: Jeffrey Morrison MRN: [id number] DOB/AGE: May 17, 1996 19 y.o.  Admit date: 07/05/2015 Discharge date: 07/08/2015  Admission Diagnoses:  Discharge Diagnoses:  Active Problems:   Sickle cell crisis (HCC)   Acute chest syndrome in sickle crisis ([place])   CAP (community acquired pneumonia)   Sickle cell crisis acute chest syndrome [facility])   Discharged Condition: fair  Hospital Course: Patient admitted with Community acquired pneumonia and Sickle cell Painful crisis. Has been on Dilaudid PCA, Toradol and IV fluids. Also was on Rocephin and Zithromax for pneumonia. Patient has done better and will be discharged home on levaquin and follow up with her PCP.   Consults: None  Significant Diagnostic Studies: labs: Serial CBCs and CMPs done. Hb was at 6.6 near his baseline  Treatments: IV hydration, antibiotics: ceftriaxone and azithromycin and analgesia: Dilaudid  Discharge Exam: Blood pressure 142/67, pulse 70, temperature 98.9 F (37.2 C), temperature source Oral, resp. rate 12, height 5' 8.9" (1.75 m), weight 74.2 kg (163 lb 9.3 oz), SpO2 100 %. General appearance: alert and cooperative Eyes: conjunctivae/corneas clear. PERRL, EOM's intact. Fundi benign. Back: symmetric, no curvature. ROM normal. No CVA tenderness. Resp: clear to auscultation bilaterally Chest wall: no tenderness Cardio: regular rate and rhythm, S1, S2 normal, no murmur, click, rub or gallop GI: soft, non-tender; bowel sounds normal; no masses,  no organomegaly Extremities: extremities normal, atraumatic, no cyanosis or edema Pulses: 2+ and symmetric Skin: Skin color, texture, turgor normal. No rashes or lesions Neurologic: Grossly normal  Disposition: 01-Home or Self Care     Medication List    TAKE these medications        acetaminophen 325 MG tablet  Commonly known as:  TYLENOL  Take 650 mg by mouth every 6 (six) hours as needed for moderate pain.     folic acid 1  MG tablet  Commonly known as:  FOLVITE  Take 1 tablet (1 mg total) by mouth daily.     hydroxyurea 500 MG capsule  Commonly known as:  HYDREA  Take 1 capsule (500 mg total) by mouth daily. May take with food to minimize GI side effects.     ibuprofen 200 MG tablet  Commonly known as:  ADVIL,MOTRIN  Take 400 mg by mouth every 6 (six) hours as needed for mild pain or moderate pain.     ICY HOT EXTRA STRENGTH 10-30 % Crea  Apply 1 application topically daily as needed (pain).     levofloxacin 750 MG tablet  Commonly known as:  LEVAQUIN  Take 1 tablet (750 mg total) by mouth daily.     mupirocin ointment 2 %  Commonly known as:  BACTROBAN  Place 1 application into the nose 2 (two) times daily. Each nare for 10 days     nystatin cream  Commonly known as:  MYCOSTATIN  Apply topically 2 (two) times daily. Apply to the groin     oxyCODONE-acetaminophen 5-325 MG tablet  Commonly known as:  PERCOCET  Take 1 tablet by mouth every 4 (four) hours as needed.           Follow-up Information    Follow up with [facility] On 07/17/2015.   Specialty:  Internal Medicine   Why:  at 10:45am   For Post Hospitalization Follow Up   Contact information:   [place]      Signed: Barbette Merino 07/08/2015, 11:49 AM  Time spent 33 minutes

## 2015-07-08 NOTE — Progress Notes (Signed)
Patient decided to discharge to home today with the understanding that physician could not provide prescription for pain medications and he would need to establish follow up care with a PCP. Patient states will call sickle cell center in am. Patient declined wheelchair assistance to vehicle, will ambulate.

## 2015-07-08 NOTE — Plan of Care (Signed)
Problem: Discharge Progression Outcomes Goal: Discharge plan in place and appropriate Outcome: Completed/Met Date Met:  07/08/15 Pt to get a voucher for ride home

## 2015-07-09 LAB — CULTURE, RESPIRATORY: CULTURE: NORMAL

## 2015-07-09 LAB — CULTURE, RESPIRATORY W GRAM STAIN

## 2015-07-11 LAB — CULTURE, BLOOD (ROUTINE X 2)
CULTURE: NO GROWTH
Culture: NO GROWTH

## 2015-07-17 ENCOUNTER — Ambulatory Visit: Payer: Medicaid Other | Admitting: Internal Medicine

## 2015-08-11 ENCOUNTER — Emergency Department (HOSPITAL_COMMUNITY)
Admission: EM | Admit: 2015-08-11 | Discharge: 2015-08-11 | Disposition: A | Discharge status: Home / Self Care | Payer: Self-pay | Attending: Emergency Medicine | Admitting: Emergency Medicine

## 2015-08-11 ENCOUNTER — Encounter (HOSPITAL_COMMUNITY): Payer: Self-pay | Admitting: Emergency Medicine

## 2015-08-11 DIAGNOSIS — M25562 Pain in left knee: Secondary | ICD-10-CM

## 2015-08-11 DIAGNOSIS — Z79899 Other long term (current) drug therapy: Secondary | ICD-10-CM | POA: Insufficient documentation

## 2015-08-11 DIAGNOSIS — G8929 Other chronic pain: Secondary | ICD-10-CM | POA: Insufficient documentation

## 2015-08-11 DIAGNOSIS — D5700 Hb-SS disease with crisis, unspecified: Secondary | ICD-10-CM | POA: Insufficient documentation

## 2015-08-11 DIAGNOSIS — F17210 Nicotine dependence, cigarettes, uncomplicated: Secondary | ICD-10-CM | POA: Insufficient documentation

## 2015-08-11 DIAGNOSIS — Z792 Long term (current) use of antibiotics: Secondary | ICD-10-CM | POA: Insufficient documentation

## 2015-08-11 LAB — CBC WITH DIFFERENTIAL/PLATELET
BASOS ABS: 0 10*3/uL (ref 0.0–0.1)
Basophils Relative: 0 %
EOS ABS: 0.5 10*3/uL (ref 0.0–0.7)
Eosinophils Relative: 3 %
HCT: 19.9 % — ABNORMAL LOW (ref 39.0–52.0)
Hemoglobin: 7 g/dL — ABNORMAL LOW (ref 13.0–17.0)
LYMPHS ABS: 3.8 10*3/uL (ref 0.7–4.0)
Lymphocytes Relative: 23 %
MCH: 32.9 pg (ref 26.0–34.0)
MCHC: 35.2 g/dL (ref 30.0–36.0)
MCV: 93.4 fL (ref 78.0–100.0)
Monocytes Absolute: 1.7 10*3/uL — ABNORMAL HIGH (ref 0.1–1.0)
Monocytes Relative: 10 %
NEUTROS ABS: 10.6 10*3/uL — AB (ref 1.7–7.7)
Neutrophils Relative %: 64 %
PLATELETS: 289 10*3/uL (ref 150–400)
RBC: 2.13 MIL/uL — AB (ref 4.22–5.81)
RDW: 25.3 % — AB (ref 11.5–15.5)
WBC: 16.6 10*3/uL — AB (ref 4.0–10.5)

## 2015-08-11 LAB — BASIC METABOLIC PANEL
Anion gap: 7 (ref 5–15)
BUN: 12 mg/dL (ref 6–20)
CALCIUM: 8.7 mg/dL — AB (ref 8.9–10.3)
CO2: 24 mmol/L (ref 22–32)
CREATININE: 0.93 mg/dL (ref 0.61–1.24)
Chloride: 106 mmol/L (ref 101–111)
GFR calc Af Amer: >60 mL/min (ref >60)
Glucose, Bld: 87 mg/dL (ref 65–99)
Potassium: 4.2 mmol/L (ref 3.5–5.1)
SODIUM: 137 mmol/L (ref 135–145)

## 2015-08-11 LAB — RETICULOCYTES: RBC.: 2.13 MIL/uL — ABNORMAL LOW (ref 4.22–5.81)

## 2015-08-11 MED ORDER — OXYCODONE-ACETAMINOPHEN 5-325 MG PO TABS: 2.0000 | ORAL_TABLET | ORAL | Status: DC | PRN

## 2015-08-11 MED ORDER — SODIUM CHLORIDE 0.45 % IV SOLN
INTRAVENOUS | Status: DC
  Administered 2015-08-11: 19:00:00 via INTRAVENOUS

## 2015-08-11 MED ORDER — HYDROMORPHONE HCL 1 MG/ML IJ SOLN
1.0000 mg | Freq: Once | INTRAMUSCULAR | Status: AC
  Administered 2015-08-11: 1 mg via INTRAVENOUS
  Filled 2015-08-11: qty 1

## 2015-08-11 MED ORDER — ONDANSETRON HCL 4 MG/2ML IJ SOLN
4.0000 mg | Freq: Once | INTRAMUSCULAR | Status: AC
  Administered 2015-08-11: 4 mg via INTRAVENOUS
  Filled 2015-08-11: qty 2

## 2015-08-11 NOTE — ED Notes (Signed)
Bed: [id number] Expected date:  Expected time:  Means of arrival:  Comments: SCC

## 2015-08-11 NOTE — ED Notes (Signed)
Pt reports improvement in pain but new onset nausea.  PA notified.

## 2015-08-11 NOTE — ED Provider Notes (Signed)
CSN: [id number]     Arrival date & time 08/11/15  1732 History   First MD Initiated Contact with Patient 08/11/15 1751     Chief Complaint  Patient presents with  . Sickle Cell Pain Crisis     (Consider location/radiation/quality/duration/timing/severity/associated sxs/prior Treatment) HPI   Patient is a 19 year old male with history of sickle cell disease who presents to the ED via EMS with complaint of left knee and leg pain, onset noon. Patient reports he began having pain in his left knee and leg that is consistent with pain associated with his sickle cell crisis, pain is a 9/10. Patient reports his mother gave him a muscle relaxant at home and reports mild relief but he notes the pain returned after the medication wore off resulting in him calling EMS. EMS administered 30 mg Toradol and route which patient reports relieved his pain. He states his pain is now a 6/10. Denies fever, chills, SOB, cough, CP, abdominal pain, N/V/D, numbness, tingling, weakness. Denies any recent fall, trauma, injury. Patient states he currently does not have a primary care provider. He notes he was last seen in the ED for sickle cell pain last month and had an appointment scheduled for the sickle cell clinic which he reports he was unable to attend. Patient states he is currently out of Percocet which she was given during his last ED visit.  Past Medical History  Diagnosis Date  . Sickle cell crisis ([place])   . Chronic lower back pain    Past Surgical History  Procedure Laterality Date  . Splenectomy     No family history on file. Social History  Substance Use Topics  . Smoking status: Current Every Day Smoker -- 0.50 packs/day    Types: Cigarettes  . Smokeless tobacco: None  . Alcohol Use: No    Review of Systems  Musculoskeletal:       Left knee and leg pain  All other systems reviewed and are negative.     Allergies  Review of patient's allergies indicates no known allergies.  Home  Medications   Prior to Admission medications   Medication Sig Start Date End Date Taking? Authorizing Provider  acetaminophen (TYLENOL) 325 MG tablet Take 650 mg by mouth every 6 (six) hours as needed for moderate pain.   Yes Historical Provider, MD  folic acid (FOLVITE) 1 MG tablet Take 1 tablet (1 mg total) by mouth daily. 11/16/14  Yes Ripudeep Krystal Eaton, MD  guaiFENesin (ROBITUSSIN) 100 MG/5ML liquid Take 200 mg by mouth daily as needed for cough.   Yes Historical Provider, MD  hydroxyurea (HYDREA) 500 MG capsule Take 1 capsule (500 mg total) by mouth daily. May take with food to minimize GI side effects. 11/16/14  Yes Ripudeep Krystal Eaton, MD  ibuprofen (ADVIL,MOTRIN) 200 MG tablet Take 400 mg by mouth every 6 (six) hours as needed for mild pain or moderate pain.   Yes Historical Provider, MD  Menthol-Methyl Salicylate (ICY HOT EXTRA STRENGTH) 10-30 % CREA Apply 1 application topically daily as needed (pain).   Yes Historical Provider, MD  penicillin v potassium (VEETID) 500 MG tablet Take 500 mg by mouth 2 (two) times daily.   Yes Historical Provider, MD  levofloxacin (LEVAQUIN) 750 MG tablet Take 1 tablet (750 mg total) by mouth daily. 07/08/15   Elwyn Reach, MD  mupirocin ointment (BACTROBAN) 2 % Place 1 application into the nose 2 (two) times daily. Each nare for 10 days Patient not taking: Reported on 07/05/2015  11/16/14   Ripudeep Krystal Eaton, MD  nystatin cream (MYCOSTATIN) Apply topically 2 (two) times daily. Apply to the groin Patient not taking: Reported on 07/05/2015 11/16/14   Ripudeep Krystal Eaton, MD  oxyCODONE-acetaminophen (PERCOCET/ROXICET) 5-325 MG tablet Take 2 tablets by mouth every 4 (four) hours as needed for severe pain. 08/11/15   Chesley Noon Ona Roehrs, PA-C   BP 89/41 mmHg  Pulse 54  Temp(Src) 98.6 F (37 C) (Oral)  Resp 16  Wt 73.936 kg  SpO2 94% Physical Exam  Constitutional: He is oriented to person, place, and time. He appears well-developed and well-nourished.  HENT:  Head:  Normocephalic and atraumatic.  Mouth/Throat: Oropharynx is clear and moist. No oropharyngeal exudate.  Eyes: Conjunctivae and EOM are normal. Right eye exhibits no discharge. Left eye exhibits no discharge. No scleral icterus.  Neck: Normal range of motion. Neck supple.  Cardiovascular: Normal rate, regular rhythm, normal heart sounds and intact distal pulses.   Pulmonary/Chest: Effort normal and breath sounds normal. No respiratory distress. He has no wheezes. He has no rales. He exhibits no tenderness.  Abdominal: Soft. Bowel sounds are normal. He exhibits no distension and no mass. There is no tenderness. There is no rebound and no guarding.  Musculoskeletal: Normal range of motion. He exhibits no edema or tenderness.  Lymphadenopathy:    He has no cervical adenopathy.  Neurological: He is alert and oriented to person, place, and time.  Skin: Skin is warm and dry.  Nursing note and vitals reviewed.   ED Course  Procedures (including critical care time) Labs Review Labs Reviewed  CBC WITH DIFFERENTIAL/PLATELET - Abnormal; Notable for the following:    WBC 16.6 (*)    RBC 2.13 (*)    Hemoglobin 7.0 (*)    HCT 19.9 (*)    RDW 25.3 (*)    Neutro Abs 10.6 (*)    Monocytes Absolute 1.7 (*)    All other components within normal limits  BASIC METABOLIC PANEL - Abnormal; Notable for the following:    Calcium 8.7 (*)    All other components within normal limits  RETICULOCYTES - Abnormal; Notable for the following:    Retic Ct Pct >23.0 (*)    RBC. 2.13 (*)    All other components within normal limits    Imaging Review No results found. I have personally reviewed and evaluated these images and lab results as part of my medical decision-making.  Filed Vitals:   08/11/15 2005 08/11/15 2030  BP: 98/48 89/41  Pulse: 72 54  Temp:    Resp: 16 16     MDM   Final diagnoses:  Hb-SS disease with crisis ([place])  Left knee pain    Patient presents with left knee and leg pain.  History of sickle cell disease, he reports he currently does not have a primary care doctor and has not seen by any provider regarding his sickle cell disease. He reports his pain is consistent with his typical sickle cell crisis pain. Denies fever, chest pain, shortness of breath. VSS. Exam unremarkable. Labs at patient's baseline values. Patient given 1 dose of Dilaudid and Zofran. He reports his pain has improved and he is wanting to go home at this time. Pt's presentation appears to be consistent with his sickle cell crisis pain and with management of his pain meds I feel that he is appropriate at this time for d/c. I do not suspect ACS at this time. Discussed results and plan for discharge with patient. Advised patient  to follow up with a primary care provider for management of his pain related to sickle cell disease, patient given resources.  Evaluation does not show pathology requring ongoing emergent intervention or admission. Pt is hemodynamically stable and mentating appropriately. Discussed findings/results and plan with patient/guardian, who agrees with plan. All questions answered. Return precautions discussed and outpatient follow up given.      Chesley Noon [place], [place] 08/12/15 [place], MD 08/12/15 1356

## 2015-08-11 NOTE — Discharge Instructions (Signed)
Take your medications as prescribed as needed for pain relief. Follow up with the [facility] this week for management of your pain associated with your sickle cell disease. Please return to the Emergency Department if symptoms worsen or new onset of fever, difficulty breathing, chest pain, vomiting.

## 2015-08-11 NOTE — ED Notes (Signed)
Pt from home via EMS-Per EMS, pt c/o SCC that started today in his L knee. Pt mother gave pt a muscle relaxer that helped until it wore off. Pt received 30 Toradol en route that relieved pt pain. Pt is A&O and in NAD

## 2015-08-30 ENCOUNTER — Encounter (HOSPITAL_COMMUNITY): Payer: Self-pay | Admitting: Emergency Medicine

## 2015-08-30 ENCOUNTER — Emergency Department (HOSPITAL_COMMUNITY): Payer: Self-pay

## 2015-08-30 ENCOUNTER — Emergency Department (HOSPITAL_COMMUNITY)
Admission: EM | Admit: 2015-08-30 | Discharge: 2015-08-30 | Disposition: A | Discharge status: Home / Self Care | Payer: Medicaid Other | Attending: Emergency Medicine | Admitting: Emergency Medicine

## 2015-08-30 ENCOUNTER — Telehealth: Payer: Self-pay | Admitting: *Deleted

## 2015-08-30 DIAGNOSIS — Z79899 Other long term (current) drug therapy: Secondary | ICD-10-CM | POA: Insufficient documentation

## 2015-08-30 DIAGNOSIS — F17210 Nicotine dependence, cigarettes, uncomplicated: Secondary | ICD-10-CM | POA: Insufficient documentation

## 2015-08-30 DIAGNOSIS — G8929 Other chronic pain: Secondary | ICD-10-CM | POA: Insufficient documentation

## 2015-08-30 DIAGNOSIS — S80212A Abrasion, left knee, initial encounter: Secondary | ICD-10-CM | POA: Insufficient documentation

## 2015-08-30 DIAGNOSIS — S199XXA Unspecified injury of neck, initial encounter: Secondary | ICD-10-CM | POA: Insufficient documentation

## 2015-08-30 DIAGNOSIS — Y92481 Parking lot as the place of occurrence of the external cause: Secondary | ICD-10-CM | POA: Insufficient documentation

## 2015-08-30 DIAGNOSIS — Y9302 Activity, running: Secondary | ICD-10-CM | POA: Insufficient documentation

## 2015-08-30 DIAGNOSIS — S0993XA Unspecified injury of face, initial encounter: Secondary | ICD-10-CM

## 2015-08-30 DIAGNOSIS — Z862 Personal history of diseases of the blood and blood-forming organs and certain disorders involving the immune mechanism: Secondary | ICD-10-CM | POA: Insufficient documentation

## 2015-08-30 DIAGNOSIS — Y998 Other external cause status: Secondary | ICD-10-CM | POA: Insufficient documentation

## 2015-08-30 DIAGNOSIS — W01198A Fall on same level from slipping, tripping and stumbling with subsequent striking against other object, initial encounter: Secondary | ICD-10-CM | POA: Insufficient documentation

## 2015-08-30 DIAGNOSIS — Z792 Long term (current) use of antibiotics: Secondary | ICD-10-CM | POA: Insufficient documentation

## 2015-08-30 DIAGNOSIS — S60511A Abrasion of right hand, initial encounter: Secondary | ICD-10-CM | POA: Insufficient documentation

## 2015-08-30 NOTE — Telephone Encounter (Signed)
Detention Officer called because pt release to their facility with medications and they are unaware of the use of meds.

## 2015-08-30 NOTE — ED Notes (Signed)
Pt presents with swelling and laceration to R side of face and c.o pain to  L knee- abrasion noted.

## 2015-08-30 NOTE — ED Notes (Signed)
Patient transported to CT scan . 

## 2015-08-30 NOTE — ED Notes (Signed)
Per ems-- pt was being chased by police when he tripped and fell into cement parking stop. Pt was unable to answer questions by gpd. Pt placed in c-collar. Pt disoriented to time.

## 2015-08-30 NOTE — Discharge Instructions (Signed)
Please read attached information. If you experience any new or worsening signs or symptoms please return to the emergency room for evaluation. Please follow-up with your primary care provider or specialist as discussed. Please use medication prescribed only as directed and discontinue taking if you have any concerning signs or symptoms.   °

## 2015-08-30 NOTE — ED Provider Notes (Signed)
CSN: [id number]     Arrival date & time 08/30/15  0155 History   First MD Initiated Contact with Patient 08/30/15 0224     Chief Complaint  Patient presents with  . Facial Laceration  . Altered Mental Status   HPI   19 year old male presents today in the custody [employer] for facial trauma. Patient was running from police when he fell hitting his face on a parking curb. Patient would not answer questions to [employer], and had obvious swelling to the right side of his face. At the time my evaluation patient was alert, responding to verbal stimuli but was not able to recall much of the incident or alert to time. Patient reports that he had no alcohol today, was using cocaine, and had not taking any of his prescription opioids today. Patient reports pain to the right and left cheek bones with obvious swelling to the right. He has a small superficial abrasion to the right ear, superficial abrasions to the right hand, superficial abrasion the left knee with global left knee pain. Patient was able to ambulate after the incident. He denies any other complaints other than those noted above, no medications prior to arrival.     Past Medical History  Diagnosis Date  . Sickle cell crisis ([place])   . Chronic lower back pain    Past Surgical History  Procedure Laterality Date  . Splenectomy     No family history on file. Social History  Substance Use Topics  . Smoking status: Current Every Day Smoker -- 0.50 packs/day    Types: Cigarettes  . Smokeless tobacco: None  . Alcohol Use: No    Review of Systems  All other systems reviewed and are negative.     Allergies  Review of patient's allergies indicates no known allergies.  Home Medications   Prior to Admission medications   Medication Sig Start Date End Date Taking? Authorizing Provider  acetaminophen (TYLENOL) 325 MG tablet Take 650 mg by mouth every 6 (six) hours as needed for moderate pain.    Historical Provider, MD  folic acid (FOLVITE) 1 MG  tablet Take 1 tablet (1 mg total) by mouth daily. 11/16/14   Ripudeep Krystal Eaton, MD  guaiFENesin (ROBITUSSIN) 100 MG/5ML liquid Take 200 mg by mouth daily as needed for cough.    Historical Provider, MD  hydroxyurea (HYDREA) 500 MG capsule Take 1 capsule (500 mg total) by mouth daily. May take with food to minimize GI side effects. 11/16/14   Ripudeep Krystal Eaton, MD  ibuprofen (ADVIL,MOTRIN) 200 MG tablet Take 400 mg by mouth every 6 (six) hours as needed for mild pain or moderate pain.    Historical Provider, MD  levofloxacin (LEVAQUIN) 750 MG tablet Take 1 tablet (750 mg total) by mouth daily. 07/08/15   Elwyn Reach, MD  Menthol-Methyl Salicylate (ICY HOT EXTRA STRENGTH) 10-30 % CREA Apply 1 application topically daily as needed (pain).    Historical Provider, MD  mupirocin ointment (BACTROBAN) 2 % Place 1 application into the nose 2 (two) times daily. Each nare for 10 days Patient not taking: Reported on 07/05/2015 11/16/14   Ripudeep Krystal Eaton, MD  nystatin cream (MYCOSTATIN) Apply topically 2 (two) times daily. Apply to the groin Patient not taking: Reported on 07/05/2015 11/16/14   Ripudeep Krystal Eaton, MD  oxyCODONE-acetaminophen (PERCOCET/ROXICET) 5-325 MG tablet Take 2 tablets by mouth every 4 (four) hours as needed for severe pain. 08/11/15   Nona Dell, PA-C  penicillin v potassium (VEETID)  500 MG tablet Take 500 mg by mouth 2 (two) times daily.    Historical Provider, MD   BP 112/58 mmHg  Pulse 84  Temp(Src) 98.4 F (36.9 C) (Oral)  Resp 16  SpO2 99%   Physical Exam  Constitutional: He is oriented to person, place, and time. He appears well-developed and well-nourished.  HENT:  Head: Normocephalic and atraumatic.  Eyes: Conjunctivae are normal. Pupils are equal, round, and reactive to light. Right eye exhibits no discharge. Left eye exhibits no discharge. No scleral icterus.  Neck: Normal range of motion. No JVD present. No tracheal deviation present.  Cardiovascular: Normal rate,  regular rhythm, normal heart sounds and intact distal pulses.  Exam reveals no gallop and no friction rub.   No murmur heard. Pulmonary/Chest: Effort normal and breath sounds normal. No stridor. No respiratory distress. He has no wheezes. He has no rales. He exhibits no tenderness.  Abdominal: He exhibits no distension and no mass. There is no tenderness. There is no rebound and no guarding.  Musculoskeletal:  Tenderness to palpation of the cervical soft tissue and spine. No T or L-spine tenderness or signs of trauma. Left knee with superficial abrasion, pain with palpation diffusely for active range of motion with pain. No significant swelling or edema. Distal sensation strength or motor function intact  Neurological: He is alert and oriented to person, place, and time. He has normal strength. No cranial nerve deficit or sensory deficit. Coordination normal. GCS eye subscore is 4. GCS verbal subscore is 5. GCS motor subscore is 6.  Skin: Skin is warm and dry. No rash noted. No erythema. No pallor.  Superficial abrasion to the right hand  Psychiatric: He has a normal mood and affect. His behavior is normal. Judgment and thought content normal.  Nursing note and vitals reviewed.   ED Course  Procedures (including critical care time) Labs Review Labs Reviewed - No data to display  Imaging Review Ct Head Wo Contrast  08/30/2015  CLINICAL DATA:  Tripped and fell into cement parking stop. Right facial laceration. Concern for head or cervical spine injury. Initial encounter. EXAM: CT HEAD WITHOUT CONTRAST CT MAXILLOFACIAL WITHOUT CONTRAST CT CERVICAL SPINE WITHOUT CONTRAST TECHNIQUE: Multidetector CT imaging of the head, cervical spine, and maxillofacial structures were performed using the standard protocol without intravenous contrast. Multiplanar CT image reconstructions of the cervical spine and maxillofacial structures were also generated. COMPARISON:  CT of the head performed 01/31/2006, and  MRI/MRA of the brain performed 07/09/2005 FINDINGS: CT HEAD FINDINGS There is no evidence of acute infarction, mass lesion, or intra- or extra-axial hemorrhage on CT. The posterior fossa, including the cerebellum, brainstem and fourth ventricle, is within normal limits. The third and lateral ventricles, and basal ganglia are unremarkable in appearance. The cerebral hemispheres are symmetric in appearance, with normal gray-white differentiation. No mass effect or midline shift is seen. There is no evidence of fracture; visualized osseous structures are unremarkable in appearance. The orbits are within normal limits. The paranasal sinuses and mastoid air cells are well-aerated. No significant soft tissue abnormalities are seen. CT MAXILLOFACIAL FINDINGS There is no evidence of fracture or dislocation. The maxilla and mandible appear intact. The nasal bone is unremarkable in appearance. The visualized dentition demonstrates no acute abnormality. Incidental note is made of a small bony spur on the left side of the base of the skull at the foramen magnum, extending adjacent to the brainstem and cerebellum. The orbits are intact bilaterally. The visualized paranasal sinuses and mastoid air  cells are well-aerated. Mild soft tissue swelling is noted lateral to the right orbit, above the right zygomatic arch. The parapharyngeal fat planes are preserved. The nasopharynx, oropharynx and hypopharynx are unremarkable in appearance. The visualized portions of the valleculae and piriform sinuses are grossly unremarkable. The parotid and submandibular glands are within normal limits. No cervical lymphadenopathy is seen. CT CERVICAL SPINE FINDINGS There is no evidence of fracture or subluxation. There is apparently mild baseline angulation of the C1-C2 articulation, possibly reflecting developmental compensation for the bony spur described above. Vertebral bodies demonstrate normal height and alignment. Intervertebral disc spaces  are preserved. Prevertebral soft tissues are within normal limits. The visualized neural foramina are grossly unremarkable. The thyroid gland is unremarkable in appearance. The visualized lung apices are clear. No significant soft tissue abnormalities are seen. IMPRESSION: 1. No evidence of traumatic intracranial injury or fracture. 2. No evidence of fracture or dislocation with regard to the maxillofacial structures. 3. No evidence of fracture or subluxation along the cervical spine. 4. Incidental note of a small developmental bony spur on the left side of the base of the skull at the foramen magnum, extending adjacent to the brainstem and cerebellum. Apparent mild baseline angulation of the atlantoaxial articulation may reflect corresponding developmental compensation. Electronically Signed   By: Garald Balding M.D.   On: 08/30/2015 03:44   Ct Cervical Spine Wo Contrast  08/30/2015  CLINICAL DATA:  Tripped and fell into cement parking stop. Right facial laceration. Concern for head or cervical spine injury. Initial encounter. EXAM: CT HEAD WITHOUT CONTRAST CT MAXILLOFACIAL WITHOUT CONTRAST CT CERVICAL SPINE WITHOUT CONTRAST TECHNIQUE: Multidetector CT imaging of the head, cervical spine, and maxillofacial structures were performed using the standard protocol without intravenous contrast. Multiplanar CT image reconstructions of the cervical spine and maxillofacial structures were also generated. COMPARISON:  CT of the head performed 01/31/2006, and MRI/MRA of the brain performed 07/09/2005 FINDINGS: CT HEAD FINDINGS There is no evidence of acute infarction, mass lesion, or intra- or extra-axial hemorrhage on CT. The posterior fossa, including the cerebellum, brainstem and fourth ventricle, is within normal limits. The third and lateral ventricles, and basal ganglia are unremarkable in appearance. The cerebral hemispheres are symmetric in appearance, with normal gray-white differentiation. No mass effect or  midline shift is seen. There is no evidence of fracture; visualized osseous structures are unremarkable in appearance. The orbits are within normal limits. The paranasal sinuses and mastoid air cells are well-aerated. No significant soft tissue abnormalities are seen. CT MAXILLOFACIAL FINDINGS There is no evidence of fracture or dislocation. The maxilla and mandible appear intact. The nasal bone is unremarkable in appearance. The visualized dentition demonstrates no acute abnormality. Incidental note is made of a small bony spur on the left side of the base of the skull at the foramen magnum, extending adjacent to the brainstem and cerebellum. The orbits are intact bilaterally. The visualized paranasal sinuses and mastoid air cells are well-aerated. Mild soft tissue swelling is noted lateral to the right orbit, above the right zygomatic arch. The parapharyngeal fat planes are preserved. The nasopharynx, oropharynx and hypopharynx are unremarkable in appearance. The visualized portions of the valleculae and piriform sinuses are grossly unremarkable. The parotid and submandibular glands are within normal limits. No cervical lymphadenopathy is seen. CT CERVICAL SPINE FINDINGS There is no evidence of fracture or subluxation. There is apparently mild baseline angulation of the C1-C2 articulation, possibly reflecting developmental compensation for the bony spur described above. Vertebral bodies demonstrate normal height and alignment. Intervertebral  disc spaces are preserved. Prevertebral soft tissues are within normal limits. The visualized neural foramina are grossly unremarkable. The thyroid gland is unremarkable in appearance. The visualized lung apices are clear. No significant soft tissue abnormalities are seen. IMPRESSION: 1. No evidence of traumatic intracranial injury or fracture. 2. No evidence of fracture or dislocation with regard to the maxillofacial structures. 3. No evidence of fracture or subluxation along  the cervical spine. 4. Incidental note of a small developmental bony spur on the left side of the base of the skull at the foramen magnum, extending adjacent to the brainstem and cerebellum. Apparent mild baseline angulation of the atlantoaxial articulation may reflect corresponding developmental compensation. Electronically Signed   By: Garald Balding M.D.   On: 08/30/2015 03:44   Dg Knee Complete 4 Views Left  08/30/2015  CLINICAL DATA:  Left knee abrasion and anterior knee pain.  Trauma. EXAM: LEFT KNEE - COMPLETE 4+ VIEW COMPARISON:  01/08/2014 FINDINGS: There is no evidence of fracture, dislocation, or joint effusion. There is no evidence of arthropathy or other focal bone abnormality. Soft tissues are unremarkable. IMPRESSION: Negative. Electronically Signed   By: Lucienne Capers M.D.   On: 08/30/2015 03:56   Ct Maxillofacial Wo Cm  08/30/2015  CLINICAL DATA:  Tripped and fell into cement parking stop. Right facial laceration. Concern for head or cervical spine injury. Initial encounter. EXAM: CT HEAD WITHOUT CONTRAST CT MAXILLOFACIAL WITHOUT CONTRAST CT CERVICAL SPINE WITHOUT CONTRAST TECHNIQUE: Multidetector CT imaging of the head, cervical spine, and maxillofacial structures were performed using the standard protocol without intravenous contrast. Multiplanar CT image reconstructions of the cervical spine and maxillofacial structures were also generated. COMPARISON:  CT of the head performed 01/31/2006, and MRI/MRA of the brain performed 07/09/2005 FINDINGS: CT HEAD FINDINGS There is no evidence of acute infarction, mass lesion, or intra- or extra-axial hemorrhage on CT. The posterior fossa, including the cerebellum, brainstem and fourth ventricle, is within normal limits. The third and lateral ventricles, and basal ganglia are unremarkable in appearance. The cerebral hemispheres are symmetric in appearance, with normal gray-white differentiation. No mass effect or midline shift is seen. There is no  evidence of fracture; visualized osseous structures are unremarkable in appearance. The orbits are within normal limits. The paranasal sinuses and mastoid air cells are well-aerated. No significant soft tissue abnormalities are seen. CT MAXILLOFACIAL FINDINGS There is no evidence of fracture or dislocation. The maxilla and mandible appear intact. The nasal bone is unremarkable in appearance. The visualized dentition demonstrates no acute abnormality. Incidental note is made of a small bony spur on the left side of the base of the skull at the foramen magnum, extending adjacent to the brainstem and cerebellum. The orbits are intact bilaterally. The visualized paranasal sinuses and mastoid air cells are well-aerated. Mild soft tissue swelling is noted lateral to the right orbit, above the right zygomatic arch. The parapharyngeal fat planes are preserved. The nasopharynx, oropharynx and hypopharynx are unremarkable in appearance. The visualized portions of the valleculae and piriform sinuses are grossly unremarkable. The parotid and submandibular glands are within normal limits. No cervical lymphadenopathy is seen. CT CERVICAL SPINE FINDINGS There is no evidence of fracture or subluxation. There is apparently mild baseline angulation of the C1-C2 articulation, possibly reflecting developmental compensation for the bony spur described above. Vertebral bodies demonstrate normal height and alignment. Intervertebral disc spaces are preserved. Prevertebral soft tissues are within normal limits. The visualized neural foramina are grossly unremarkable. The thyroid gland is unremarkable in appearance. The  visualized lung apices are clear. No significant soft tissue abnormalities are seen. IMPRESSION: 1. No evidence of traumatic intracranial injury or fracture. 2. No evidence of fracture or dislocation with regard to the maxillofacial structures. 3. No evidence of fracture or subluxation along the cervical spine. 4. Incidental  note of a small developmental bony spur on the left side of the base of the skull at the foramen magnum, extending adjacent to the brainstem and cerebellum. Apparent mild baseline angulation of the atlantoaxial articulation may reflect corresponding developmental compensation. Electronically Signed   By: Garald Balding M.D.   On: 08/30/2015 03:44   I have personally reviewed and evaluated these images and lab results as part of my medical decision-making.   EKG Interpretation None      MDM   Final diagnoses:  Facial trauma, initial encounter    Labs:  Imaging: CT head, CT maxillofacial, CT neck, DG knee- significant findings ( incidental finding noted above)  Consults:  Therapeutics:  Discharge Meds:   Assessment/Plan: Patient presents status post facial trauma, originally he was not answering questions appropriately, unable to determine if this was patient compliance or due to head trauma. CT scan ordered due to inadequate ability to assess mental functioning, CT face for obvious swelling to the right cheek with significant tenderness, neck cleared via CT C-spine, DG knee. No significant findings on exam or diagnostic imaging that would necessitate further evaluation and management here in the ED. Incidental note was read word for word to patient who understood and agreed to discuss this with his primary care provider. Patient reports one to baseline mental status over the course of his hospital stay I saw no neurological deficits or significant findings. Discharged in the custody of [employer] with return precautions given.         Okey Regal, PA-C 08/30/15 [place], DO 08/31/15 [phone]

## 2015-09-06 ENCOUNTER — Telehealth: Payer: Self-pay | Admitting: General Practice

## 2015-09-06 NOTE — Telephone Encounter (Signed)
Left message for patient to call to reschedule cancelled appointment from 09/18/15.

## 2015-09-18 ENCOUNTER — Ambulatory Visit: Payer: Self-pay | Admitting: Internal Medicine

## 2015-10-29 ENCOUNTER — Emergency Department (HOSPITAL_COMMUNITY)
Admission: EM | Admit: 2015-10-29 | Discharge: 2015-10-29 | Disposition: A | Discharge status: Home / Self Care | Payer: Medicaid Other | Attending: Emergency Medicine | Admitting: Emergency Medicine

## 2015-10-29 ENCOUNTER — Encounter (HOSPITAL_COMMUNITY): Payer: Self-pay | Admitting: Emergency Medicine

## 2015-10-29 DIAGNOSIS — Z792 Long term (current) use of antibiotics: Secondary | ICD-10-CM | POA: Insufficient documentation

## 2015-10-29 DIAGNOSIS — Z79899 Other long term (current) drug therapy: Secondary | ICD-10-CM | POA: Insufficient documentation

## 2015-10-29 DIAGNOSIS — H109 Unspecified conjunctivitis: Secondary | ICD-10-CM | POA: Insufficient documentation

## 2015-10-29 DIAGNOSIS — G8929 Other chronic pain: Secondary | ICD-10-CM | POA: Insufficient documentation

## 2015-10-29 DIAGNOSIS — F17210 Nicotine dependence, cigarettes, uncomplicated: Secondary | ICD-10-CM | POA: Insufficient documentation

## 2015-10-29 DIAGNOSIS — Z862 Personal history of diseases of the blood and blood-forming organs and certain disorders involving the immune mechanism: Secondary | ICD-10-CM | POA: Insufficient documentation

## 2015-10-29 MED ORDER — TETRACAINE HCL 0.5 % OP SOLN
2.0000 [drp] | Freq: Once | OPHTHALMIC | Status: AC
  Administered 2015-10-29: 2 [drp] via OPHTHALMIC
  Filled 2015-10-29: qty 4

## 2015-10-29 MED ORDER — ERYTHROMYCIN 5 MG/GM OP OINT: TOPICAL_OINTMENT | OPHTHALMIC | Status: DC

## 2015-10-29 MED ORDER — FLUORESCEIN SODIUM 1 MG OP STRP
1.0000 | ORAL_STRIP | Freq: Once | OPHTHALMIC | Status: AC
  Administered 2015-10-29: 1 via OPHTHALMIC
  Filled 2015-10-29: qty 1

## 2015-10-29 MED ORDER — ACETAMINOPHEN 500 MG PO TABS: 500.0000 mg | ORAL_TABLET | Freq: Four times a day (QID) | ORAL | Status: DC | PRN

## 2015-10-29 NOTE — ED Notes (Signed)
Patient was alert, oriented and stable upon discharge. RN went over AVS and patient had no further questions.  

## 2015-10-29 NOTE — Discharge Instructions (Signed)
Bacterial Conjunctivitis Bacterial conjunctivitis, commonly called pink eye, is an inflammation of the clear membrane that covers the white part of the eye (conjunctiva). The inflammation can also happen on the underside of the eyelids. The blood vessels in the conjunctiva become inflamed, causing the eye to become red or pink. Bacterial conjunctivitis may spread easily from one eye to another and from person to person (contagious).  CAUSES  Bacterial conjunctivitis is caused by bacteria. The bacteria may come from your own skin, your upper respiratory tract, or from someone else with bacterial conjunctivitis. SYMPTOMS  The normally white color of the eye or the underside of the eyelid is usually pink or red. The pink eye is usually associated with irritation, tearing, and some sensitivity to light. Bacterial conjunctivitis is often associated with a thick, yellowish discharge from the eye. The discharge may turn into a crust on the eyelids overnight, which causes your eyelids to stick together. If a discharge is present, there may also be some blurred vision in the affected eye. DIAGNOSIS  Bacterial conjunctivitis is diagnosed by your caregiver through an eye exam and the symptoms that you report. Your caregiver looks for changes in the surface tissues of your eyes, which may point to the specific type of conjunctivitis. A sample of any discharge may be collected on a cotton-tip swab if you have a severe case of conjunctivitis, if your cornea is affected, or if you keep getting repeat infections that do not respond to treatment. The sample will be sent to a lab to see if the inflammation is caused by a bacterial infection and to see if the infection will respond to antibiotic medicines. TREATMENT   Bacterial conjunctivitis is treated with antibiotics. Antibiotic eyedrops are most often used. However, antibiotic ointments are also available. Antibiotics pills are sometimes used. Artificial tears or eye  washes may ease discomfort. HOME CARE INSTRUCTIONS   To ease discomfort, apply a cool, clean washcloth to your eye for 10-20 minutes, 3-4 times a day.  Gently wipe away any drainage from your eye with a warm, wet washcloth or a cotton ball.  Wash your hands often with soap and water. Use paper towels to dry your hands.  Do not share towels or washcloths. This may spread the infection.  Change or wash your pillowcase every day.  You should not use eye makeup until the infection is gone.  Do not operate machinery or drive if your vision is blurred.  Stop using contact lenses. Ask your caregiver how to sterilize or replace your contacts before using them again. This depends on the type of contact lenses that you use.  When applying medicine to the infected eye, do not touch the edge of your eyelid with the eyedrop bottle or ointment tube. SEEK IMMEDIATE MEDICAL CARE IF:   Your infection has not improved within 3 days after beginning treatment.  You had yellow discharge from your eye and it returns.  You have increased eye pain.  Your eye redness is spreading.  Your vision becomes blurred.  You have a fever or persistent symptoms for more than 2-3 days.  You have a fever and your symptoms suddenly get worse.  You have facial pain, redness, or swelling. MAKE SURE YOU:   Understand these instructions.  Will watch your condition.  Will get help right away if you are not doing well or get worse.   This information is not intended to replace advice given to you by your health care provider. Make sure you   discuss any questions you have with your health care provider.   Document Released: 09/01/2005 Document Revised: 09/22/2014 Document Reviewed: 02/02/2012 Elsevier Interactive Patient Education 2016 [employer] Viral Conjunctivitis Viral conjunctivitis is an inflammation of the clear membrane that covers the white part of your eye and the inner surface of your eyelid  (conjunctiva). The inflammation is caused by a viral infection. The blood vessels in the conjunctiva become inflamed, causing the eye to become red or pink, and often itchy. Viral conjunctivitis can easily be passed from one person to another (contagious). CAUSES  Viral conjunctivitis is caused by a virus. A virus is a type of contagious germ. It can be spread by touching objects that have been contaminated with the virus, such as doorknobs or towels.  SYMPTOMS  Symptoms of viral conjunctivitis may include:   Eye redness.  Tearing or watery eyes.  Itchy eyes.  Burning feeling in the eyes.  Clear drainage from the eye.  Swollen eyelids.  A gritty feeling in the eye.  Light sensitivity. DIAGNOSIS  Viral conjunctivitis may be diagnosed with a medical history and physical exam. If you have discharge from your eye, the discharge may be tested to rule out other causes of conjunctivitis.  TREATMENT  Viral conjunctivitis does not respond to medicines that kill bacteria (antibiotics). Treatment for viral conjunctivitis is directed at stopping a bacterial infection from developing in addition to the viral infection. Treatment also aims to relieve your symptoms, such as itching. This may be done with antihistamine drops or other eye medicines. HOME CARE INSTRUCTIONS  Take medicines only as directed by your health care provider.  Avoid touching or rubbing your eyes.  Apply a warm, clean washcloth to your eye for 10-20 minutes, 3-4 times per day.  If you wear contact lenses, do not wear them until the inflammation is gone and your health care provider says it is safe to wear them again. Ask your health care provider how to sterilize or replace your contact lenses before using them again. Wear glasses until you can resume wearing contacts.  Avoid wearing eye makeup until the inflammation is gone. Throw away any old eye cosmetics that may be contaminated.  Change or wash your pillowcase every  day.  Do not share towels or washcloths. This may spread the infection.  Wash your hands often with soap and water. Use paper towels to dry your hands.  Gently wipe away any drainage from your eye with a warm, wet washcloth or a cotton ball.  Be very careful to avoid touching the edge of the eyelid with the eye drop bottle or ointment tube when applying medicines to the affected eye. This will stop you from spreading the infection to the other eye or to other people. SEEK MEDICAL CARE IF:   Your symptoms do not improve with treatment.  You have increased pain.  Your vision becomes blurry.  You have a fever.  You have facial pain, redness, or swelling.  You have new symptoms.  Your symptoms get worse.   This information is not intended to replace advice given to you by your health care provider. Make sure you discuss any questions you have with your health care provider.   Document Released: 11/22/2002 Document Revised: 02/23/2006 Document Reviewed: 06/13/2014 Elsevier Interactive Patient Education [employer].

## 2015-10-29 NOTE — ED Notes (Signed)
Pt states that he has been rubbing his R eye and now it hurts and is irritated. No trauma. Alert and oriented. In police custody.

## 2015-10-29 NOTE — ED Provider Notes (Signed)
CSN: [id number]     Arrival date & time 10/29/15  2105 History  By signing my name below, I, Jeffrey Morrison, attest that this documentation has been prepared under the direction and in the presence of Delos Haring, PA-C Electronically Signed: Soijett Morrison, ED Scribe. 10/29/2015. 10:57 PM.   Chief Complaint  Patient presents with  . Eye Pain      The history is provided by the patient. No language interpreter was used.    HPI Comments: Jeffrey Morrison is a 20 y.o. male who presents to the Emergency Department in police custody complaining of right eye pain, redness and drainage onset 7:20 PM today. He notes that he began to have pain to his right eye initially prior to him rubbing his eye. Pt denies any injury/trauma or working with dirt or debris prior to the onset of his symptoms. He states that he has not tried any medications for the relief for his symptoms. He denies any other symptoms.   Past Medical History  Diagnosis Date  . Sickle cell crisis ([place])   . Chronic lower back pain    Past Surgical History  Procedure Laterality Date  . Splenectomy     History reviewed. No pertinent family history. Social History  Substance Use Topics  . Smoking status: Current Every Day Smoker -- 0.50 packs/day    Types: Cigarettes  . Smokeless tobacco: None  . Alcohol Use: No    Review of Systems  Constitutional: Negative for fever.  Eyes: Positive for pain and itching.  All other systems reviewed and are negative.   Allergies  Review of patient's allergies indicates no known allergies.  Home Medications   Prior to Admission medications   Medication Sig Start Date End Date Taking? Authorizing Provider  acetaminophen (TYLENOL) 325 MG tablet Take 650 mg by mouth every 6 (six) hours as needed for moderate pain.    Historical Provider, MD  acetaminophen (TYLENOL) 500 MG tablet Take 1 tablet (500 mg total) by mouth every 6 (six) hours as needed. 10/29/15   Delos Haring, PA-C   erythromycin ophthalmic ointment Place a 1/2 inch ribbon of ointment into the lower eyelid BID for 10 days 10/29/15   Delos Haring, PA-C  folic acid (FOLVITE) 1 MG tablet Take 1 tablet (1 mg total) by mouth daily. 11/16/14   Ripudeep Krystal Eaton, MD  guaiFENesin (ROBITUSSIN) 100 MG/5ML liquid Take 200 mg by mouth daily as needed for cough.    Historical Provider, MD  hydroxyurea (HYDREA) 500 MG capsule Take 1 capsule (500 mg total) by mouth daily. May take with food to minimize GI side effects. 11/16/14   Ripudeep Krystal Eaton, MD  ibuprofen (ADVIL,MOTRIN) 200 MG tablet Take 400 mg by mouth every 6 (six) hours as needed for mild pain or moderate pain.    Historical Provider, MD  levofloxacin (LEVAQUIN) 750 MG tablet Take 1 tablet (750 mg total) by mouth daily. 07/08/15   Elwyn Reach, MD  Menthol-Methyl Salicylate (ICY HOT EXTRA STRENGTH) 10-30 % CREA Apply 1 application topically daily as needed (pain).    Historical Provider, MD  mupirocin ointment (BACTROBAN) 2 % Place 1 application into the nose 2 (two) times daily. Each nare for 10 days Patient not taking: Reported on 07/05/2015 11/16/14   Ripudeep Krystal Eaton, MD  nystatin cream (MYCOSTATIN) Apply topically 2 (two) times daily. Apply to the groin Patient not taking: Reported on 07/05/2015 11/16/14   Ripudeep Krystal Eaton, MD  oxyCODONE-acetaminophen (PERCOCET/ROXICET) 5-325 MG tablet Take  2 tablets by mouth every 4 (four) hours as needed for severe pain. 08/11/15   Nona Dell, PA-C  penicillin v potassium (VEETID) 500 MG tablet Take 500 mg by mouth 2 (two) times daily.    Historical Provider, MD   BP 146/76 mmHg  Pulse 70  Temp(Src) 98.3 F (36.8 C) (Oral)  Resp 16  SpO2 96% Physical Exam  Constitutional: He is oriented to person, place, and time. He appears well-developed and well-nourished. No distress.  HENT:  Head: Normocephalic and atraumatic. Head is without raccoon's eyes, without Battle's sign, without contusion, without right periorbital  erythema and without left periorbital erythema.  Eyes: EOM are normal. Pupils are equal, round, and reactive to light. Right eye exhibits discharge (small amount). Left eye exhibits no discharge and no exudate. Right conjunctiva is injected. Right conjunctiva has no hemorrhage. Left conjunctiva is not injected. Left conjunctiva has no hemorrhage. No scleral icterus.  Fundoscopic exam:      The right eye shows red reflex.  Slit lamp exam:      The right eye shows no corneal abrasion, no hyphema and no fluorescein uptake.  Neck: Neck supple.  Cardiovascular: Normal rate.   Pulmonary/Chest: Effort normal. No respiratory distress.  Abdominal: Soft. There is no tenderness.  Musculoskeletal: Normal range of motion.  Neurological: He is alert and oriented to person, place, and time.  Skin: Skin is warm and dry.  Psychiatric: He has a normal mood and affect. His behavior is normal.  Nursing note and vitals reviewed.   ED Course  Procedures (including critical care time) DIAGNOSTIC STUDIES: Oxygen Saturation is 96% on RA, nl by my interpretation.    COORDINATION OF CARE: 10:55 PM Discussed treatment plan with pt at bedside which includes visual acuity, abx Rx and pt agreed to plan.    Labs Review Labs Reviewed - No data to display  Imaging Review No results found.   EKG Interpretation None      MDM   Final diagnoses:  Conjunctivitis of right eye   Patient presentation consistent with conjunctivitis.  No evidence of corneal abrasions, entrapment, consensual photophobia, or herpes keratitis.  Presentation not concerning for iritis, or corneal abrasions.  Pt discharged with erythromycin ointment.  Personal hygiene and frequent handwashing discussed.  Patient advised to follow up with ophthalmologist if symptoms persist or worsen. Return precautions discussed.  Patient verbalizes understanding and is agreeable with discharge.    I personally performed the services described in this  documentation, which was scribed in my presence. The recorded information has been reviewed and is accurate.   Delos Haring, PA-C 10/29/15 [id number]  Davonna Belling, MD 10/30/15 [id number]

## 2015-12-27 ENCOUNTER — Telehealth: Payer: Self-pay

## 2016-08-06 NOTE — Telephone Encounter (Signed)
ERROR

## 2017-02-21 ENCOUNTER — Encounter (HOSPITAL_COMMUNITY): Payer: Self-pay | Admitting: Emergency Medicine

## 2017-02-21 ENCOUNTER — Emergency Department (HOSPITAL_COMMUNITY): Payer: Medicare Other

## 2017-02-21 ENCOUNTER — Emergency Department (HOSPITAL_COMMUNITY)
Admission: EM | Admit: 2017-02-21 | Discharge: 2017-02-21 | Disposition: A | Discharge status: Home / Self Care | Payer: Medicare Other | Attending: Emergency Medicine | Admitting: Emergency Medicine

## 2017-02-21 DIAGNOSIS — Z79899 Other long term (current) drug therapy: Secondary | ICD-10-CM | POA: Insufficient documentation

## 2017-02-21 DIAGNOSIS — F17210 Nicotine dependence, cigarettes, uncomplicated: Secondary | ICD-10-CM | POA: Insufficient documentation

## 2017-02-21 DIAGNOSIS — M25551 Pain in right hip: Secondary | ICD-10-CM | POA: Diagnosis not present

## 2017-02-21 DIAGNOSIS — D5700 Hb-SS disease with crisis, unspecified: Secondary | ICD-10-CM | POA: Insufficient documentation

## 2017-02-21 DIAGNOSIS — M25561 Pain in right knee: Secondary | ICD-10-CM | POA: Diagnosis not present

## 2017-02-21 DIAGNOSIS — M79604 Pain in right leg: Secondary | ICD-10-CM | POA: Diagnosis present

## 2017-02-21 DIAGNOSIS — D72829 Elevated white blood cell count, unspecified: Secondary | ICD-10-CM | POA: Diagnosis not present

## 2017-02-21 LAB — CBC WITH DIFFERENTIAL/PLATELET
Band Neutrophils: 5 %
Basophils Absolute: 0 10*3/uL (ref 0.0–0.1)
Basophils Relative: 0 %
Blasts: 0 %
Eosinophils Absolute: 0.2 10*3/uL (ref 0.0–0.7)
Eosinophils Relative: 1 %
HCT: 25.2 % — ABNORMAL LOW (ref 39.0–52.0)
Hemoglobin: 8.7 g/dL — ABNORMAL LOW (ref 13.0–17.0)
Lymphocytes Relative: 20 %
Lymphs Abs: 3.1 10*3/uL (ref 0.7–4.0)
MCH: 34.4 pg — ABNORMAL HIGH (ref 26.0–34.0)
MCHC: 34.5 g/dL (ref 30.0–36.0)
MCV: 99.6 fL (ref 78.0–100.0)
Metamyelocytes Relative: 1 %
Monocytes Absolute: 2 10*3/uL — ABNORMAL HIGH (ref 0.1–1.0)
Monocytes Relative: 13 %
Myelocytes: 0 %
Neutro Abs: 9.9 10*3/uL — ABNORMAL HIGH (ref 1.7–7.7)
Neutrophils Relative %: 58 %
Other: 2 %
Platelets: 348 10*3/uL (ref 150–400)
Promyelocytes Absolute: 0 %
RBC: 2.53 MIL/uL — ABNORMAL LOW (ref 4.22–5.81)
RDW: 21.9 % — ABNORMAL HIGH (ref 11.5–15.5)
WBC: 15.4 10*3/uL — ABNORMAL HIGH (ref 4.0–10.5)
nRBC: 36 /100 WBC — ABNORMAL HIGH

## 2017-02-21 LAB — COMPREHENSIVE METABOLIC PANEL
ALT: 18 U/L (ref 17–63)
AST: 37 U/L (ref 15–41)
Albumin: 4.3 g/dL (ref 3.5–5.0)
Alkaline Phosphatase: 64 U/L (ref 38–126)
Anion gap: 8 (ref 5–15)
BUN: 7 mg/dL (ref 6–20)
CO2: 23 mmol/L (ref 22–32)
Calcium: 9.2 mg/dL (ref 8.9–10.3)
Chloride: 104 mmol/L (ref 101–111)
Creatinine, Ser: 0.84 mg/dL (ref 0.61–1.24)
GFR calc Af Amer: >60 mL/min (ref >60)
GFR calc non Af Amer: >60 mL/min (ref >60)
Glucose, Bld: 104 mg/dL — ABNORMAL HIGH (ref 65–99)
Potassium: 4.1 mmol/L (ref 3.5–5.1)
Sodium: 135 mmol/L (ref 135–145)
Total Bilirubin: 4.4 mg/dL — ABNORMAL HIGH (ref 0.3–1.2)
Total Protein: 7.4 g/dL (ref 6.5–8.1)

## 2017-02-21 MED ORDER — HYDROMORPHONE HCL 1 MG/ML IJ SOLN
2.0000 mg | INTRAMUSCULAR | Status: AC
  Administered 2017-02-21: 2 mg via INTRAVENOUS
  Filled 2017-02-21: qty 2

## 2017-02-21 MED ORDER — IBUPROFEN 600 MG PO TABS: 600.0000 mg | ORAL_TABLET | Freq: Four times a day (QID) | ORAL | 0 refills | Status: DC | PRN

## 2017-02-21 MED ORDER — HYDROMORPHONE HCL 1 MG/ML IJ SOLN: 1.0000 mg | Freq: Once | INTRAMUSCULAR | Status: DC

## 2017-02-21 MED ORDER — HYDROMORPHONE HCL 1 MG/ML IJ SOLN: 2.0000 mg | INTRAMUSCULAR | Status: AC

## 2017-02-21 MED ORDER — OXYCODONE-ACETAMINOPHEN 5-325 MG PO TABS
1.0000 | ORAL_TABLET | ORAL | Status: DC | PRN
  Administered 2017-02-21: 1 via ORAL

## 2017-02-21 MED ORDER — OXYCODONE-ACETAMINOPHEN 5-325 MG PO TABS
ORAL_TABLET | ORAL | Status: AC
  Filled 2017-02-21: qty 1

## 2017-02-21 MED ORDER — IBUPROFEN 400 MG PO TABS
600.0000 mg | ORAL_TABLET | Freq: Once | ORAL | Status: AC
  Administered 2017-02-21: 600 mg via ORAL
  Filled 2017-02-21: qty 1

## 2017-02-21 MED ORDER — DEXTROSE-NACL 5-0.45 % IV SOLN
INTRAVENOUS | Status: DC
  Administered 2017-02-21: 12:00:00 via INTRAVENOUS

## 2017-02-21 MED ORDER — HYDROMORPHONE HCL 1 MG/ML IJ SOLN
2.0000 mg | INTRAMUSCULAR | Status: AC
  Administered 2017-02-21: 2 mg via SUBCUTANEOUS

## 2017-02-21 MED ORDER — HYDROMORPHONE HCL 1 MG/ML IJ SOLN
2.0000 mg | INTRAMUSCULAR | Status: AC
  Filled 2017-02-21: qty 2

## 2017-02-21 NOTE — ED Notes (Signed)
Pt feels that this is a sickle cell crisis, and he has been out of medicine. Received percocet at triage.

## 2017-02-21 NOTE — ED Provider Notes (Signed)
[place] DEPT Provider Note   CSN: [id number] Arrival date & time: 02/21/17  0973     History   Chief Complaint Chief Complaint  Patient presents with  . Leg Pain  . Sickle Cell Pain Crisis    HPI Jeffrey Morrison is a 21 y.o. male with PMHX Hb-SS disease Who presents today with chief complaint gradual onset, gradually worsening constant sharp right leg pain which began last night at around 10 PM. Patient states that pain is localized to the right hip radiating to the right knee. Standing worsens the pain, however he can ambulate even though it is painful. He states relaxing and laying down helps his pain. He states he has had similar pain in the past but usually it is in his bilateral legs and lower back. He notes that temperature changes in the weather can sometimes bring on his pain. Pain is only in his right LE at this time. He denies trauma, falls, numbness, tingling, or weakness. He denies trying anything at home for his pain. He has had Percocet in triage which she states was not helpful. He denies fever, chills, chest pain, shortness of breath, abdominal pain, nausea, vomiting, diarrhea, melena, hematuria, dysuria.  The history is provided by the patient.    Past Medical History:  Diagnosis Date  . Chronic lower back pain   . Sickle cell crisis [facility])     Patient Active Problem List   Diagnosis Date Noted  . CAP (community acquired pneumonia) 07/05/2015  . Sickle cell crisis acute chest syndrome ([place]) 07/05/2015  . Pain of left lower leg 11/12/2014  . Cellulitis 11/12/2014  . Acute folliculitis 53/29/9242  . Tinea cruris 11/12/2014  . Acute chest syndrome in sickle crisis ([place]) 08/08/2014  . HCAP (healthcare-associated pneumonia) 08/08/2014  . Acute respiratory failure with hypoxia ([place]) 08/08/2014  . Hypoxia   . Anemia of chronic disease   . Sickle cell pain crisis ([place]) 01/08/2014  . Sickle cell crisis ([place]) 01/08/2014  . Leukocytosis 01/08/2014  . Anemia  01/08/2014    Past Surgical History:  Procedure Laterality Date  . SPLENECTOMY         Home Medications    Prior to Admission medications   Medication Sig Start Date End Date Taking? Authorizing Provider  acetaminophen (TYLENOL) 500 MG tablet Take 1 tablet (500 mg total) by mouth every 6 (six) hours as needed. Patient taking differently: Take 1,000-2,000 mg by mouth every 6 (six) hours as needed for moderate pain.  10/29/15  Yes Carlota Raspberry, Tiffany, PA-C  folic acid (FOLVITE) 1 MG tablet Take 1 tablet (1 mg total) by mouth daily. 11/16/14  Yes Rai, Ripudeep K, MD  hydroxyurea (HYDREA) 500 MG capsule Take 1 capsule (500 mg total) by mouth daily. May take with food to minimize GI side effects. Patient taking differently: Take 500 mg by mouth 2 (two) times daily. May take with food to minimize GI side effects. 11/16/14  Yes Rai, Ripudeep K, MD  Menthol-Methyl Salicylate (ICY HOT EXTRA STRENGTH) 10-30 % CREA Apply 1 application topically daily as needed (pain).   Yes [provider]  oxyCODONE-acetaminophen (PERCOCET/ROXICET) 5-325 MG tablet Take 2 tablets by mouth every 4 (four) hours as needed for severe pain. 08/11/15  Yes Nona Dell, PA-C  penicillin v potassium (VEETID) 500 MG tablet Take 500 mg by mouth daily.    Yes [provider]  ibuprofen (ADVIL,MOTRIN) 600 MG tablet Take 1 tablet (600 mg total) by mouth every 6 (six) hours as  needed for moderate pain. 02/21/17   Renita Papa, PA-C    Family History No family history on file.  Social History Social History  Substance Use Topics  . Smoking status: Current Every Day Smoker    Packs/day: 0.50    Types: Cigarettes  . Smokeless tobacco: Never Used  . Alcohol use No     Allergies   Patient has no known allergies.   Review of Systems Review of Systems  Constitutional: Negative for chills and fever.  Respiratory: Negative for shortness of breath.   Cardiovascular: Negative for chest pain.    Gastrointestinal: Negative for abdominal pain, diarrhea, nausea and vomiting.  Genitourinary: Negative for dysuria and hematuria.  Musculoskeletal: Positive for arthralgias. Negative for back pain, joint swelling and neck pain.  Neurological: Negative for syncope, weakness and numbness.  All other systems reviewed and are negative.    Physical Exam Updated Vital Signs BP 119/80   Pulse (!) 45   Temp 98.8 F (37.1 C) (Oral)   Resp 13   Ht 5\' 9"  (1.753 m)   Wt 87.1 kg (192 lb)   SpO2 96%   BMI 28.35 kg/m   Physical Exam  Constitutional: He is oriented to person, place, and time. He appears well-developed and well-nourished. No distress.  HENT:  Head: Normocephalic and atraumatic.  Right Ear: External ear normal.  Left Ear: External ear normal.  Mouth/Throat: Oropharynx is clear and moist. No oropharyngeal exudate.  Eyes: Conjunctivae are normal. Right eye exhibits no discharge. Left eye exhibits no discharge. No scleral icterus.  Neck: Neck supple. No JVD present. No tracheal deviation present. No thyromegaly present.  Cardiovascular: Normal rate, regular rhythm, normal heart sounds and intact distal pulses.  Exam reveals no gallop and no friction rub.   No murmur heard. 2+ radial and DP/PT pulses bl, negative Homan's bl   Pulmonary/Chest: Effort normal and breath sounds normal. No respiratory distress. He has no wheezes. He has no rales.  Abdominal: Soft. Bowel sounds are normal. He exhibits no distension. There is no tenderness.  Musculoskeletal: Normal range of motion. He exhibits tenderness. He exhibits no edema or deformity.  Normal PROM of BL hips. Pain elicited with flexion of the right hip. No tenderness to palpation of the left hip. Lateral thigh minimally tender to palpation. Right knee tender to palpation at the superior pole of the patella. No varus/valgus deformity. No ligamentous laxity. No deformity, crepitus, or swelling or erythema noted. Negative  anterior/posterior drawer test. Normal range of motion, pain is elicited at the superior portion of the knee with flexion of the knee. No midline spine TTP. No deformity, crepitus, or step-off noted. No paraspinal muscle tenderness.  Neurological: He is alert and oriented to person, place, and time. No cranial nerve deficit or sensory deficit.  Fluent speech, no facial droop, sensation intact to soft touch of BLE, antalgic gait   Skin: Skin is warm and dry. Capillary refill takes less than 2 seconds. No rash noted. He is not diaphoretic. No erythema. No pallor.  Psychiatric: He has a normal mood and affect. His behavior is normal.  Nursing note and vitals reviewed.    ED Treatments / Results  Labs (all labs ordered are listed, but only abnormal results are displayed) Labs Reviewed  CBC WITH DIFFERENTIAL/PLATELET - Abnormal; Notable for the following:       Result Value   WBC 15.4 (*)    RBC 2.53 (*)    Hemoglobin 8.7 (*)    HCT 25.2 (*)  MCH 34.4 (*)    RDW 21.9 (*)    nRBC 36 (*)    Neutro Abs 9.9 (*)    Monocytes Absolute 2.0 (*)    All other components within normal limits  COMPREHENSIVE METABOLIC PANEL - Abnormal; Notable for the following:    Glucose, Bld 104 (*)    Total Bilirubin 4.4 (*)    All other components within normal limits  URINALYSIS, ROUTINE W REFLEX MICROSCOPIC    EKG  EKG Interpretation None       Radiology Dg Knee Complete 4 Views Right  Result Date: 02/21/2017 CLINICAL DATA:  Right knee pain. Sickle cell anemia. No reported injury. EXAM: RIGHT KNEE - COMPLETE 4+ VIEW COMPARISON:  None. FINDINGS: No fracture, joint effusion, malalignment or suspicious focal osseous lesion. Faint patchy sclerosis in the distal right femoral shaft and proximal right tibial metadiaphysis, compatible with bone infarcts. No radiopaque foreign body. 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 Electronically Signed    By: Ilona Sorrel M.D.   On: 02/21/2017 11:00   Dg Hip Unilat With Pelvis 2-3 Views Right  Result Date: 02/21/2017 CLINICAL DATA:  Chronic pain.  Sickle cell disease EXAM: DG HIP (WITH OR WITHOUT PELVIS) 2-3V RIGHT COMPARISON:  None. FINDINGS: Frontal pelvis as well as frontal and lateral right hip images were obtained. No fracture or dislocation. Joint spaces appear normal. No erosive change or evidence of bone infarct. An assimilation joint is noted in the lumbosacral region on the right, an anatomic variant. IMPRESSION: No fracture or dislocation. No avascular necrosis or erosion. No appreciable joint space narrowing. Electronically Signed   By: Lowella Grip III M.D.   On: 02/21/2017 11:00    Procedures Procedures (including critical care time)  Medications Ordered in ED Medications  oxyCODONE-acetaminophen (PERCOCET/ROXICET) 5-325 MG per tablet 1 tablet (1 tablet Oral Given 02/21/17 0840)  oxyCODONE-acetaminophen (PERCOCET/ROXICET) 5-325 MG per tablet (not administered)  HYDROmorphone (DILAUDID) injection 1 mg (not administered)  dextrose 5 %-0.45 % sodium chloride infusion ( Intravenous New Bag/Given 02/21/17 1150)  HYDROmorphone (DILAUDID) injection 2 mg (not administered)    Or  HYDROmorphone (DILAUDID) injection 2 mg (not administered)  HYDROmorphone (DILAUDID) injection 2 mg (not administered)    Or  HYDROmorphone (DILAUDID) injection 2 mg (not administered)  HYDROmorphone (DILAUDID) injection 2 mg (2 mg Intravenous Given 02/21/17 1145)    Or  HYDROmorphone (DILAUDID) injection 2 mg ( Subcutaneous See Alternative 02/21/17 1145)  HYDROmorphone (DILAUDID) injection 2 mg (2 mg Intravenous Given 02/21/17 1304)    Or  HYDROmorphone (DILAUDID) injection 2 mg ( Subcutaneous See Alternative 02/21/17 1304)     Initial Impression / Assessment and Plan / ED Course  I have reviewed the triage vital signs and the nursing notes.  Pertinent labs & imaging results that were available during my care  of the patient were reviewed by me and considered in my medical decision making (see chart for details).     Patient presents sickle cell pain crisis in multiple joints. Afebrile, vital signs are stable and presentation. No significant changes in his lab work from last in February done by [facility] sickle cell clinic. Initiate pain management with Dilaudid. On re-evaluation by Dr. Winfred Leeds, he states he now has pain in his left shoulder and that his pain feels like his usual sickle cell pain. He tells me "I hurt all over but I don't want to bother you all". Pain management while in the ED. Low suspicion  of acute chest syndrome, infectious process such as meningitis or pneumonia, aplastic crisis or splenic sequestration crisis. Pain successfully managed while in the ED. He states he is currently homeless and has run out of pain medication given to him by present, from which she was recently discharged. Case manager consulted, who recommends the patient call the sickle cell center on Monday to establish follow-up swiftly. She also recommends follow-up during open hours on Thursday. Patient will manage pain with ibuprofen in the meantime, and knows to follow up with the sickle cell center. Discussed indications for return to the ED. Pt verbalized understanding of and agreement with plan and is safe for discharge home at this time.    Final Clinical Impressions(s) / ED Diagnoses   Final diagnoses:  Sickle cell pain crisis (HCC)    New Prescriptions New Prescriptions   IBUPROFEN (ADVIL,MOTRIN) 600 MG TABLET    Take 1 tablet (600 mg total) by mouth every 6 (six) hours as needed for moderate pain.     Renita Papa, PA-C 02/21/17 [place], MD 02/21/17 [phone]

## 2017-02-21 NOTE — ED Notes (Signed)
C/o more pain   Pain med given

## 2017-02-21 NOTE — ED Triage Notes (Signed)
Received pt from home with c/o right leg pain in thigh area onset 1 hour PTA. Pt has history of sickle cell and ran out of his oxycodone 2 weeks ago.

## 2017-02-21 NOTE — ED Notes (Signed)
Talked with the pa about the pts pain

## 2017-02-21 NOTE — Care Management Note (Signed)
Case Management Note  Patient Details  Name: Jeffrey Morrison MRN: [id number] Date of Birth: 19-Aug-1996  Subjective/Objective:  21 y.o. M seen in the ED for [facility]. CM spoke with the PA-C who reports he is stable and will need followup for Dz management as he was recently released from [facility] and has no follow up care. Encouraged that he can go to [facility] on [address] for open clinic hours on Thursday 02/26/2017 and ask to be soon ASAP. CM will In basket CM for [facility] to promote follow up. Pt has Medicaid and should be able to afford medications for short term pain management until he can procure PCP.                   Action/Plan:CM will sign off for now but will be available should additional discharge needs arise or disposition change.    Expected Discharge Date:                  Expected Discharge Plan:  Home/Self Care  In-House Referral:     Discharge planning Services  CM Consult, [facility]  Post Acute Care Choice:    Choice offered to:     DME Arranged:    DME Agency:     HH Arranged:    [place] Agency:     Status of Service:  Completed, signed off  If discussed at [employer] of Stay Meetings, dates discussed:    Additional Comments:  Delrae Sawyers, RN 02/21/2017, 3:19 PM

## 2017-02-21 NOTE — ED Notes (Signed)
Pt drinking ginger ale  

## 2017-02-21 NOTE — ED Notes (Signed)
Pt crying with pain

## 2017-02-21 NOTE — ED Provider Notes (Signed)
Complains of right thighain and left shoulder pain typical of sickle cell crisis he's had in the past. Started yesterday. He is not taking any medicine at home has he has run out. He denies shortness breath fever or cough or chest pain. No other associated symptoms. Nothing makes symptoms better or worse.. On exam patient is alert appears mildly uncomfortable. Glasgow Coma Score 15 lungs clear auscultation heart regular rate and rhythm abdomen nondistended nontender all 4 extremity is a redness swelling or tenderness neurovascularly intact   Orlie Dakin, MD 02/21/17 1120

## 2017-02-21 NOTE — ED Notes (Signed)
Pt itching, but has eyes closed, nodding off. BP==98/62,

## 2017-02-21 NOTE — ED Notes (Signed)
The pts reports that his pain is no better

## 2017-02-21 NOTE — ED Notes (Signed)
Pt tearful upon departure and reports he plans to "walk home" despite feeling weak. Pt given bus pass and wheeled to lobby. EDP aware of pt condition.

## 2017-02-21 NOTE — Discharge Instructions (Addendum)
Ask your doctor to help you quit smoking. This will be very important for your sickle cell pain. Take ibuprofen 600 milligrams 4 times daily for your pain. Follow-up with the sickle cell center. Call them on Monday to establish a follow-up appointment. They have open hours on Thursday. Return to the ED if any concerning signs or symptoms develop.

## 2017-02-21 NOTE — ED Notes (Signed)
Pt states that he is homeless, left parents house last night-- pt having rambling speech, states was in prison for 2 years and has been at parents' house since released-- "kicked me out because I was drinking alcohol"

## 2017-02-21 NOTE — ED Notes (Signed)
Pt asking for more pain med

## 2017-02-23 ENCOUNTER — Telehealth: Payer: Self-pay

## 2017-02-23 ENCOUNTER — Emergency Department (HOSPITAL_COMMUNITY): Payer: Medicare Other

## 2017-02-23 ENCOUNTER — Encounter (HOSPITAL_COMMUNITY): Payer: Self-pay | Admitting: Nurse Practitioner

## 2017-02-23 ENCOUNTER — Emergency Department (HOSPITAL_COMMUNITY)
Admission: EM | Admit: 2017-02-23 | Discharge: 2017-02-23 | Disposition: A | Discharge status: Home / Self Care | Payer: Medicare Other | Attending: Emergency Medicine | Admitting: Emergency Medicine

## 2017-02-23 DIAGNOSIS — D5700 Hb-SS disease with crisis, unspecified: Secondary | ICD-10-CM | POA: Insufficient documentation

## 2017-02-23 DIAGNOSIS — R9431 Abnormal electrocardiogram [ECG] [EKG]: Secondary | ICD-10-CM | POA: Diagnosis not present

## 2017-02-23 DIAGNOSIS — F17210 Nicotine dependence, cigarettes, uncomplicated: Secondary | ICD-10-CM | POA: Diagnosis not present

## 2017-02-23 DIAGNOSIS — Z79899 Other long term (current) drug therapy: Secondary | ICD-10-CM | POA: Diagnosis not present

## 2017-02-23 DIAGNOSIS — M79602 Pain in left arm: Secondary | ICD-10-CM | POA: Diagnosis not present

## 2017-02-23 DIAGNOSIS — D571 Sickle-cell disease without crisis: Secondary | ICD-10-CM | POA: Diagnosis not present

## 2017-02-23 LAB — CBC WITH DIFFERENTIAL/PLATELET
BASOS PCT: 0 %
Band Neutrophils: 0 %
Basophils Absolute: 0 10*3/uL (ref 0.0–0.1)
Blasts: 0 %
EOS ABS: 0 10*3/uL (ref 0.0–0.7)
EOS PCT: 0 %
HCT: 26.2 % — ABNORMAL LOW (ref 39.0–52.0)
Hemoglobin: 9.4 g/dL — ABNORMAL LOW (ref 13.0–17.0)
LYMPHS PCT: 5 %
Lymphs Abs: 0.8 10*3/uL (ref 0.7–4.0)
MCH: 34.8 pg — ABNORMAL HIGH (ref 26.0–34.0)
MCHC: 35.9 g/dL (ref 30.0–36.0)
MCV: 97 fL (ref 78.0–100.0)
MONO ABS: 2.7 10*3/uL — AB (ref 0.1–1.0)
MYELOCYTES: 0 %
Metamyelocytes Relative: 0 %
Monocytes Relative: 17 %
NEUTROS ABS: 12.6 10*3/uL — AB (ref 1.7–7.7)
NEUTROS PCT: 78 %
NRBC: 0 /100{WBCs}
PLATELETS: 393 10*3/uL (ref 150–400)
Promyelocytes Absolute: 0 %
RBC: 2.7 MIL/uL — ABNORMAL LOW (ref 4.22–5.81)
RDW: 21 % — AB (ref 11.5–15.5)
WBC: 16.1 10*3/uL — ABNORMAL HIGH (ref 4.0–10.5)

## 2017-02-23 LAB — LACTATE DEHYDROGENASE: LDH: 373 U/L — ABNORMAL HIGH (ref 98–192)

## 2017-02-23 LAB — COMPREHENSIVE METABOLIC PANEL
ALBUMIN: 4.5 g/dL (ref 3.5–5.0)
ALT: 33 U/L (ref 17–63)
AST: 35 U/L (ref 15–41)
Alkaline Phosphatase: 75 U/L (ref 38–126)
Anion gap: 10 (ref 5–15)
BUN: 7 mg/dL (ref 6–20)
CHLORIDE: 100 mmol/L — AB (ref 101–111)
CO2: 25 mmol/L (ref 22–32)
CREATININE: 0.66 mg/dL (ref 0.61–1.24)
Calcium: 9.2 mg/dL (ref 8.9–10.3)
GFR calc non Af Amer: >60 mL/min (ref >60)
Glucose, Bld: 98 mg/dL (ref 65–99)
Potassium: 3.9 mmol/L (ref 3.5–5.1)
SODIUM: 135 mmol/L (ref 135–145)
Total Bilirubin: 5.9 mg/dL — ABNORMAL HIGH (ref 0.3–1.2)
Total Protein: 7.9 g/dL (ref 6.5–8.1)

## 2017-02-23 LAB — RETICULOCYTES: RBC.: 2.7 MIL/uL — AB (ref 4.22–5.81)

## 2017-02-23 LAB — TROPONIN I: Troponin I: <0.03 ng/mL (ref <0.03)

## 2017-02-23 MED ORDER — HYDROMORPHONE HCL 1 MG/ML IJ SOLN
1.0000 mg | INTRAMUSCULAR | Status: AC
  Administered 2017-02-23: 1 mg via INTRAVENOUS
  Filled 2017-02-23: qty 1

## 2017-02-23 MED ORDER — MELOXICAM 15 MG PO TABS: 15.0000 mg | ORAL_TABLET | Freq: Every day | ORAL | 0 refills | Status: DC

## 2017-02-23 MED ORDER — PROCHLORPERAZINE EDISYLATE 5 MG/ML IJ SOLN
5.0000 mg | Freq: Once | INTRAMUSCULAR | Status: AC
  Administered 2017-02-23: 5 mg via INTRAVENOUS
  Filled 2017-02-23: qty 2

## 2017-02-23 MED ORDER — HYDROMORPHONE HCL 1 MG/ML IJ SOLN: 1.0000 mg | INTRAMUSCULAR | Status: AC

## 2017-02-23 MED ORDER — KETOROLAC TROMETHAMINE 30 MG/ML IJ SOLN
30.0000 mg | Freq: Once | INTRAMUSCULAR | Status: AC
  Administered 2017-02-23: 30 mg via INTRAVENOUS
  Filled 2017-02-23: qty 1

## 2017-02-23 MED ORDER — MORPHINE SULFATE (PF) 2 MG/ML IV SOLN
4.0000 mg | Freq: Once | INTRAVENOUS | Status: AC
  Administered 2017-02-23: 4 mg via INTRAVENOUS
  Filled 2017-02-23: qty 2

## 2017-02-23 MED ORDER — HYDROMORPHONE HCL 1 MG/ML IJ SOLN
0.5000 mg | INTRAMUSCULAR | Status: AC
  Administered 2017-02-23: 0.5 mg via INTRAVENOUS
  Filled 2017-02-23: qty 1

## 2017-02-23 MED ORDER — HYDROXYUREA 500 MG PO CAPS: 1000.0000 mg | ORAL_CAPSULE | Freq: Every day | ORAL | 0 refills | Status: DC

## 2017-02-23 MED ORDER — HYDROMORPHONE HCL 1 MG/ML IJ SOLN: 0.5000 mg | INTRAMUSCULAR | Status: AC

## 2017-02-23 MED ORDER — ONDANSETRON HCL 4 MG/2ML IJ SOLN
4.0000 mg | Freq: Once | INTRAMUSCULAR | Status: AC
  Administered 2017-02-23: 4 mg via INTRAVENOUS
  Filled 2017-02-23: qty 2

## 2017-02-23 MED ORDER — OXYCODONE-ACETAMINOPHEN 5-325 MG PO TABS: 1.0000 | ORAL_TABLET | ORAL | 0 refills | Status: DC | PRN

## 2017-02-23 NOTE — ED Triage Notes (Signed)
Patient was at [facility] the day before yesterday for sickle cell and pain is still having pain in his right leg and arm. Patient was discharged with Ibuprofen and he is requesting something stronger.

## 2017-02-23 NOTE — Progress Notes (Signed)
Patient ID: Jeffrey Morrison, male   DOB: 03/27/1996, 21 y.o.   MRN: [id number] Called by Ned Grace from the ED regarding patient presenting with Sickle Cell Crisis. Per Vernie Shanks, patient received a total of 4 mg of IV Dilaudid and 30 mg of Ketorolac. Pain decreased from 10/10 down to 2/10 and per Abigail patient clinically stable. Abigail expressed concern that patient possible had an acute infarct as he is having new pain in the LE. I explained to Abigail that bony infarct are common in Sickle Cell and without any abnormality requiring orthopedic intervention, the care  is supportive. Vernie Shanks has patient set up for an appointment at the [facility] in 3 days and her plan is to discharge the patient with Opiate pain medications to manage pain intermittently which based on the information presented, I felt was a reasonable plan of care. I also advised Ibuprofen for a short course and continued Hydrea use.   Nik Gorrell A.

## 2017-02-23 NOTE — Telephone Encounter (Signed)
Message received from Leona Carry, RN CM requesting an appointment to establish care at the sickle cell clinic.  The patient is currently in the ED and an appointment has been scheduled in the [facility] for 03/03/17

## 2017-02-23 NOTE — Progress Notes (Signed)
CM noted patient's second ED visit within 2 days. Patient reports being recently  released from prison and has Sickle Cell Disease. Patient currently does not have any follow up for [facility] management. He presents today with sickle cell pain crisis. ED evaluation still in progress. CM contacted [facility] to schedule an appointment to establish care Tuesday 6/19 at 2pm.

## 2017-02-23 NOTE — Discharge Instructions (Signed)
Contact a health care provider if: °You have a fever. °Get help right away if: °You feel dizzy or faint. °You have new abdominal pain, especially on the left side near the stomach area. °You develop a persistent, often uncomfortable and painful penile erection (priapism). If this is not treated immediately it will lead to impotence. °You have numbness your arms or legs or you have a hard time moving them. °You have a hard time with speech. °You have a fever or persistent symptoms for more than 2-3 days. °You have a fever and your symptoms suddenly get worse. °You have signs or symptoms of infection. These include: °Chills. °Abnormal tiredness (lethargy). °Irritability. °Poor eating. °Vomiting. °You develop pain that is not helped with medicine. °You develop shortness of breath. °You have pain in your chest. °You are coughing up pus-like or bloody sputum. °You develop a stiff neck. °Your feet or hands swell or have pain. °Your abdomen appears bloated. °You develop joint pain. °

## 2017-02-23 NOTE — ED Notes (Signed)
Bed: SR15 Expected date:  Expected time:  Means of arrival:  Comments: [facility]

## 2017-02-23 NOTE — ED Provider Notes (Addendum)
[facility] DEPT Provider Note   CSN: [id number] Arrival date & time: 02/23/17  1308     History   Chief Complaint Chief Complaint  Patient presents with  . Sickle Cell Pain Crisis    HPI Jeffrey Morrison is a 21 y.o. male who presents emergency Department with chief complaint of sickle cell pain crisis. Patient was seen last night at [facility] emergency department for the same. Patient states that he was recently released from prison and does not have primary care at this time. He is taking hydroxyurea. He is not on any opiate medications at this time. He complains of severe pain in his left arm and leg. He denies chest pain, shortness of breath or fevers. Left knee xray shows bone infarct yesterday.  HPI  Past Medical History:  Diagnosis Date  . Chronic lower back pain   . Sickle cell crisis [facility])     Patient Active Problem List   Diagnosis Date Noted  . CAP (community acquired pneumonia) 07/05/2015  . Sickle cell crisis acute chest syndrome ([place]) 07/05/2015  . Pain of left lower leg 11/12/2014  . Cellulitis 11/12/2014  . Acute folliculitis 65/78/4696  . Tinea cruris 11/12/2014  . Acute chest syndrome in sickle crisis ([place]) 08/08/2014  . HCAP (healthcare-associated pneumonia) 08/08/2014  . Acute respiratory failure with hypoxia ([place]) 08/08/2014  . Hypoxia   . Anemia of chronic disease   . Sickle cell pain crisis ([place]) 01/08/2014  . Sickle cell crisis ([place]) 01/08/2014  . Leukocytosis 01/08/2014  . Anemia 01/08/2014    Past Surgical History:  Procedure Laterality Date  . SPLENECTOMY         Home Medications    Prior to Admission medications   Medication Sig Start Date End Date Taking? Authorizing Provider  acetaminophen (TYLENOL) 500 MG tablet Take 1 tablet (500 mg total) by mouth every 6 (six) hours as needed. Patient taking differently: Take 1,000-2,000 mg by mouth every 6 (six) hours as needed for moderate pain.  10/29/15  Yes Carlota Raspberry, Tiffany, PA-C  folic  acid (FOLVITE) 1 MG tablet Take 1 tablet (1 mg total) by mouth daily. 11/16/14  Yes Rai, Ripudeep K, MD  hydroxyurea (HYDREA) 500 MG capsule Take 2 capsules (1,000 mg total) by mouth daily. May take with food to minimize GI side effects. 02/23/17   Dolce Sylvia, Vernie Shanks, PA-C  ibuprofen (ADVIL,MOTRIN) 600 MG tablet Take 1 tablet (600 mg total) by mouth every 6 (six) hours as needed for moderate pain. Patient not taking: Reported on 02/23/2017 02/21/17   Rodell Perna A, PA-C  meloxicam (MOBIC) 15 MG tablet Take 1 tablet (15 mg total) by mouth daily. Take 1 daily with food. 02/23/17   Margarita Mail, PA-C  oxyCODONE-acetaminophen (PERCOCET) 5-325 MG tablet Take 1-2 tablets by mouth every 4 (four) hours as needed. 02/23/17   Margarita Mail, PA-C    Family History History reviewed. No pertinent family history.  Social History Social History  Substance Use Topics  . Smoking status: Current Every Day Smoker    Packs/day: 0.50    Types: Cigarettes  . Smokeless tobacco: Never Used  . Alcohol use No     Allergies   Patient has no known allergies.   Review of Systems Review of Systems  Ten systems reviewed and are negative for acute change, except as noted in the HPI.  Physical Exam Updated Vital Signs BP 129/87 (BP Location: Right Arm)   Pulse 77   Temp 99 F (37.2 C)   Resp 18  Ht 5\' 9"  (1.753 m)   Wt 87.1 kg (192 lb)   SpO2 97%   BMI 28.35 kg/m   Physical Exam  Constitutional: He appears well-developed and well-nourished. No distress.  HENT:  Head: Normocephalic and atraumatic.  Eyes: Conjunctivae are normal. Scleral icterus is present.  Neck: Normal range of motion. Neck supple.  Cardiovascular: Normal rate, regular rhythm and normal heart sounds.   Pulmonary/Chest: Effort normal and breath sounds normal. No respiratory distress.  Abdominal: Soft. There is no tenderness.  Musculoskeletal: He exhibits no edema.  Neurological: He is alert.  Skin: Skin is warm and dry. He is not  diaphoretic.  Psychiatric: His behavior is normal.  Nursing note and vitals reviewed.    ED Treatments / Results  Labs (all labs ordered are listed, but only abnormal results are displayed) Labs Reviewed  COMPREHENSIVE METABOLIC PANEL - Abnormal; Notable for the following:       Result Value   Chloride 100 (*)    Total Bilirubin 5.9 (*)    All other components within normal limits  CBC WITH DIFFERENTIAL/PLATELET - Abnormal; Notable for the following:    WBC 16.1 (*)    RBC 2.70 (*)    Hemoglobin 9.4 (*)    HCT 26.2 (*)    MCH 34.8 (*)    RDW 21.0 (*)    Neutro Abs 12.6 (*)    Monocytes Absolute 2.7 (*)    All other components within normal limits  LACTATE DEHYDROGENASE - Abnormal; Notable for the following:    LDH 373 (*)    All other components within normal limits  RETICULOCYTES - Abnormal; Notable for the following:    Retic Ct Pct >23.0 (*)    RBC. 2.70 (*)    All other components within normal limits  TROPONIN I  URINALYSIS, ROUTINE W REFLEX MICROSCOPIC    EKG  EKG Interpretation None       Radiology Dg Chest 2 View  Result Date: 02/23/2017 CLINICAL DATA:  Sickle cell disease with left-sided pain EXAM: CHEST  2 VIEW COMPARISON:  07/05/2015 FINDINGS: The heart size and mediastinal contours are within normal limits. Both lungs are clear. The visualized skeletal structures are unremarkable. IMPRESSION: No active cardiopulmonary disease. Electronically Signed   By: Franchot Gallo M.D.   On: 02/23/2017 10:52   Dg Shoulder Left  Result Date: 02/23/2017 CLINICAL DATA:  Sickle cell pain crisis with left shoulder and arm discomfort. EXAM: LEFT SHOULDER - 2+ VIEW COMPARISON:  Chest x-ray of July 05, 2015 FINDINGS: The bones of the left shoulder are subjectively adequately mineralized. The joint spaces are well maintained. No soft tissue calcifications or other soft tissue abnormalities are observed. IMPRESSION: There is no acute or significant chronic bony abnormality  of the left shoulder. Electronically Signed   By: David  Martinique M.D.   On: 02/23/2017 10:52    Procedures Procedures (including critical care time)  Medications Ordered in ED Medications  HYDROmorphone (DILAUDID) injection 1 mg (1 mg Intravenous Not Given 02/23/17 1226)    Or  HYDROmorphone (DILAUDID) injection 1 mg ( Subcutaneous See Alternative 02/23/17 1226)  HYDROmorphone (DILAUDID) injection 1 mg (1 mg Intravenous Not Given 02/23/17 1228)    Or  HYDROmorphone (DILAUDID) injection 1 mg ( Subcutaneous See Alternative 02/23/17 1228)  HYDROmorphone (DILAUDID) injection 1 mg (1 mg Intravenous Not Given 02/23/17 1414)    Or  HYDROmorphone (DILAUDID) injection 1 mg ( Subcutaneous See Alternative 02/23/17 1414)  ketorolac (TORADOL) 30 MG/ML injection 30 mg (30  mg Intravenous Given 02/23/17 1032)  ondansetron (ZOFRAN) injection 4 mg (4 mg Intravenous Given 02/23/17 1032)  morphine 2 MG/ML injection 4 mg (4 mg Intravenous Given 02/23/17 1032)  HYDROmorphone (DILAUDID) injection 0.5 mg (0.5 mg Intravenous Given 02/23/17 1126)    Or  HYDROmorphone (DILAUDID) injection 0.5 mg ( Subcutaneous See Alternative 02/23/17 1126)  prochlorperazine (COMPAZINE) injection 5 mg (5 mg Intravenous Given 02/23/17 1412)     Initial Impression / Assessment and Plan / ED Course  I have reviewed the triage vital signs and the nursing notes.  Pertinent labs & imaging results that were available during my care of the patient were reviewed by me and considered in my medical decision making (see chart for details).     Jeffrey EMPEY is a 21 y.o. male who presents to ED for pain c/w typical sickle cell crisis. No shortness of breath, fevers or signs of acute chest. Patientt otherwise in no acute distress objectively other than complaint of pain. Given IV fluids and pain medication. Will obtain labs and continue to monitor with admit vs. Discharge based on response and results of testing.    Patient re-evaluated and feels  much improved. Comfortable with discharge to home. Understands to follow up with sickle cell center on 6/19 and reasons to return to ER. Discussed case with Dr. Zigmund Daniel who agrees with work up and plan for dishcarge, will increase dose of Hydroxyurea to 1000mg  as discussed..      Final Clinical Impressions(s) / ED Diagnoses   Final diagnoses:  Sickle cell pain crisis [facility])    New Prescriptions Discharge Medication List as of 02/23/2017  2:19 PM    START taking these medications   Details  meloxicam (MOBIC) 15 MG tablet Take 1 tablet (15 mg total) by mouth daily. Take 1 daily with food., Starting Mon 02/23/2017, Print         Margarita Mail, PA-C 02/23/17 1624    Margarita Mail, PA-C 02/23/17 1625    Lajean Saver, MD 02/24/17 [place], [place], PA-C 03/12/17 0300    Lajean Saver, MD 03/12/17 [phone]

## 2017-02-24 LAB — PATHOLOGIST SMEAR REVIEW

## 2017-02-26 ENCOUNTER — Ambulatory Visit: Payer: Medicaid Other | Admitting: Family Medicine

## 2017-03-03 ENCOUNTER — Ambulatory Visit: Payer: Medicaid Other | Admitting: Family Medicine

## 2017-03-30 DIAGNOSIS — N4830 Priapism, unspecified: Secondary | ICD-10-CM | POA: Diagnosis not present

## 2017-03-30 DIAGNOSIS — D571 Sickle-cell disease without crisis: Secondary | ICD-10-CM | POA: Diagnosis not present

## 2017-04-06 DIAGNOSIS — N4830 Priapism, unspecified: Secondary | ICD-10-CM | POA: Diagnosis not present

## 2017-04-06 DIAGNOSIS — D571 Sickle-cell disease without crisis: Secondary | ICD-10-CM | POA: Diagnosis not present

## 2017-04-07 DIAGNOSIS — N4830 Priapism, unspecified: Secondary | ICD-10-CM | POA: Diagnosis not present

## 2017-04-10 DIAGNOSIS — N4830 Priapism, unspecified: Secondary | ICD-10-CM | POA: Diagnosis not present

## 2017-04-12 DIAGNOSIS — N4830 Priapism, unspecified: Secondary | ICD-10-CM | POA: Diagnosis not present

## 2017-04-12 DIAGNOSIS — D571 Sickle-cell disease without crisis: Secondary | ICD-10-CM | POA: Diagnosis not present

## 2017-04-25 DIAGNOSIS — N4830 Priapism, unspecified: Secondary | ICD-10-CM | POA: Diagnosis not present

## 2017-04-25 DIAGNOSIS — N50811 Right testicular pain: Secondary | ICD-10-CM | POA: Diagnosis not present

## 2017-04-25 DIAGNOSIS — N50812 Left testicular pain: Secondary | ICD-10-CM | POA: Diagnosis not present

## 2017-05-17 DIAGNOSIS — N4830 Priapism, unspecified: Secondary | ICD-10-CM | POA: Diagnosis not present

## 2017-05-20 DIAGNOSIS — D571 Sickle-cell disease without crisis: Secondary | ICD-10-CM | POA: Diagnosis not present

## 2017-05-29 ENCOUNTER — Emergency Department (HOSPITAL_COMMUNITY)
Admission: EM | Admit: 2017-05-29 | Discharge: 2017-05-29 | Disposition: A | Discharge status: Home / Self Care | Source: Home / Self Care | Attending: Physician Assistant | Admitting: Physician Assistant

## 2017-05-29 ENCOUNTER — Encounter (HOSPITAL_COMMUNITY): Payer: Self-pay

## 2017-05-29 DIAGNOSIS — N4832 Priapism due to disease classified elsewhere: Secondary | ICD-10-CM | POA: Diagnosis not present

## 2017-05-29 DIAGNOSIS — D5700 Hb-SS disease with crisis, unspecified: Secondary | ICD-10-CM | POA: Insufficient documentation

## 2017-05-29 DIAGNOSIS — F17210 Nicotine dependence, cigarettes, uncomplicated: Secondary | ICD-10-CM

## 2017-05-29 DIAGNOSIS — R079 Chest pain, unspecified: Secondary | ICD-10-CM | POA: Diagnosis not present

## 2017-05-29 DIAGNOSIS — D638 Anemia in other chronic diseases classified elsewhere: Secondary | ICD-10-CM | POA: Diagnosis not present

## 2017-05-29 DIAGNOSIS — M545 Low back pain: Secondary | ICD-10-CM | POA: Diagnosis not present

## 2017-05-29 DIAGNOSIS — N4830 Priapism, unspecified: Secondary | ICD-10-CM | POA: Diagnosis not present

## 2017-05-29 DIAGNOSIS — Z9081 Acquired absence of spleen: Secondary | ICD-10-CM | POA: Diagnosis not present

## 2017-05-29 DIAGNOSIS — D571 Sickle-cell disease without crisis: Secondary | ICD-10-CM

## 2017-05-29 DIAGNOSIS — D57219 Sickle-cell/Hb-C disease with crisis, unspecified: Secondary | ICD-10-CM | POA: Diagnosis not present

## 2017-05-29 DIAGNOSIS — Z79899 Other long term (current) drug therapy: Secondary | ICD-10-CM

## 2017-05-29 LAB — BASIC METABOLIC PANEL
ANION GAP: 5 (ref 5–15)
BUN: 12 mg/dL (ref 6–20)
CALCIUM: 9.2 mg/dL (ref 8.9–10.3)
CO2: 29 mmol/L (ref 22–32)
CREATININE: 0.86 mg/dL (ref 0.61–1.24)
Chloride: 105 mmol/L (ref 101–111)
GFR calc Af Amer: >60 mL/min (ref >60)
GLUCOSE: 96 mg/dL (ref 65–99)
Potassium: 4.4 mmol/L (ref 3.5–5.1)
Sodium: 139 mmol/L (ref 135–145)

## 2017-05-29 LAB — CBC WITH DIFFERENTIAL/PLATELET
Basophils Absolute: 0 10*3/uL (ref 0.0–0.1)
Basophils Relative: 0 %
EOS ABS: 0.3 10*3/uL (ref 0.0–0.7)
Eosinophils Relative: 2 %
HCT: 22.9 % — ABNORMAL LOW (ref 39.0–52.0)
Hemoglobin: 8.4 g/dL — ABNORMAL LOW (ref 13.0–17.0)
LYMPHS ABS: 2.3 10*3/uL (ref 0.7–4.0)
LYMPHS PCT: 17 %
MCH: 34 pg (ref 26.0–34.0)
MCHC: 36.7 g/dL — AB (ref 30.0–36.0)
MCV: 92.7 fL (ref 78.0–100.0)
MONO ABS: 1.5 10*3/uL — AB (ref 0.1–1.0)
Monocytes Relative: 11 %
NRBC: 7 /100{WBCs} — AB
Neutro Abs: 9.4 10*3/uL — ABNORMAL HIGH (ref 1.7–7.7)
Neutrophils Relative %: 70 %
PLATELETS: 406 10*3/uL — AB (ref 150–400)
RBC: 2.47 MIL/uL — ABNORMAL LOW (ref 4.22–5.81)
RDW: 22.7 % — ABNORMAL HIGH (ref 11.5–15.5)
WBC: 13.5 10*3/uL — ABNORMAL HIGH (ref 4.0–10.5)

## 2017-05-29 LAB — RETICULOCYTES
RBC.: 2.47 MIL/uL — ABNORMAL LOW (ref 4.22–5.81)
RETIC CT PCT: 22.1 % — AB (ref 0.4–3.1)
Retic Count, Absolute: 545.9 10*3/uL — ABNORMAL HIGH (ref 19.0–186.0)

## 2017-05-29 MED ORDER — HYDROMORPHONE HCL 1 MG/ML IJ SOLN
1.0000 mg | INTRAMUSCULAR | Status: AC
  Administered 2017-05-29: 0.5 mg via INTRAVENOUS
  Filled 2017-05-29: qty 1

## 2017-05-29 MED ORDER — PHENYLEPHRINE 200 MCG/ML FOR PRIAPISM / HYPOTENSION
50.0000 ug | Freq: Once | INTRAMUSCULAR | Status: AC
  Administered 2017-05-29: 50 ug via INTRACAVERNOUS
  Filled 2017-05-29: qty 50

## 2017-05-29 MED ORDER — KETOROLAC TROMETHAMINE 30 MG/ML IJ SOLN
30.0000 mg | INTRAMUSCULAR | Status: AC
  Administered 2017-05-29: 30 mg via INTRAVENOUS
  Filled 2017-05-29: qty 1

## 2017-05-29 MED ORDER — ONDANSETRON HCL 4 MG/2ML IJ SOLN: 4.0000 mg | INTRAMUSCULAR | Status: DC | PRN

## 2017-05-29 MED ORDER — DIPHENHYDRAMINE HCL 25 MG PO CAPS
25.0000 mg | ORAL_CAPSULE | ORAL | Status: DC | PRN
  Administered 2017-05-29: 25 mg via ORAL
  Filled 2017-05-29: qty 1

## 2017-05-29 MED ORDER — HYDROMORPHONE HCL 1 MG/ML IJ SOLN
1.0000 mg | INTRAMUSCULAR | Status: AC
  Administered 2017-05-29 (×2): 1 mg via INTRAVENOUS
  Filled 2017-05-29: qty 1

## 2017-05-29 MED ORDER — SODIUM CHLORIDE 0.45 % IV SOLN
INTRAVENOUS | Status: DC
  Administered 2017-05-29: 18:00:00 via INTRAVENOUS

## 2017-05-29 MED ORDER — HYDROMORPHONE HCL 1 MG/ML IJ SOLN: 1.0000 mg | INTRAMUSCULAR | Status: AC

## 2017-05-29 MED ORDER — HYDROMORPHONE HCL 1 MG/ML IJ SOLN
1.0000 mg | INTRAMUSCULAR | Status: AC
  Administered 2017-05-29: 1 mg via INTRAVENOUS
  Filled 2017-05-29: qty 1

## 2017-05-29 MED ORDER — HYDROMORPHONE HCL 1 MG/ML IJ SOLN: 0.5000 mg | INTRAMUSCULAR | Status: AC

## 2017-05-29 MED ORDER — HYDROMORPHONE HCL 1 MG/ML IJ SOLN
1.0000 mg | Freq: Once | INTRAMUSCULAR | Status: DC
  Filled 2017-05-29: qty 1

## 2017-05-29 MED ORDER — PHENYLEPHRINE 200 MCG/ML FOR PRIAPISM / HYPOTENSION
500.0000 ug | INTRAMUSCULAR | Status: DC | PRN
  Filled 2017-05-29: qty 50

## 2017-05-29 MED ORDER — HYDROMORPHONE HCL 1 MG/ML IJ SOLN
0.5000 mg | INTRAMUSCULAR | Status: AC
  Administered 2017-05-29: 0.5 mg via INTRAVENOUS
  Filled 2017-05-29: qty 1

## 2017-05-29 NOTE — ED Notes (Signed)
Bed: WA05 Expected date:  Expected time:  Means of arrival:  Comments: 

## 2017-05-29 NOTE — Consult Note (Signed)
Urology Consult Note   Requesting Attending Physician:  Macarthur Critchley, * Service Providing Consult: Urology  Consulting Attending: [place]   Reason for Consult:  Priapism  HPI: Jeffrey Morrison is seen in consultation for reasons noted above at the request of Mackuen, Courteney Lyn, * for evaluation of priapism.  This is a 21 y.o. 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 He has required injection and/or irrigation for this 8x over the past month, most recently yesterday.      Past Medical History: Past Medical History:  Diagnosis Date  . Chronic lower back pain   . Sickle cell crisis [facility])     Past Surgical History:  Past Surgical History:  Procedure Laterality Date  . SPLENECTOMY      Medication: Current Facility-Administered Medications  Medication Dose Route Frequency Provider Last Rate Last Dose  . 0.45 % sodium chloride infusion   Intravenous Continuous Russo, [place] N, PA-C 150 mL/hr at 05/29/17 1746    . diphenhydrAMINE (BENADRYL) capsule 25-50 mg  25-50 mg Oral Q4H PRN Russo, [place] N, PA-C   25 mg at 05/29/17 1557  . HYDROmorphone (DILAUDID) injection 1 mg  1 mg Intravenous Once Russo, [place] N, PA-C      . ondansetron [facility]) injection 4 mg  4 mg Intravenous Q4H PRN Russo, [place] N, PA-C      . phenylephrine 200 mcg / ml CONC. DILUTION INJ (ED / Urology USE ONLY)  500 mcg Intracavernosal Q5 Min x 2 PRN Stasia Cavalier, MD       Current Outpatient Prescriptions  Medication Sig Dispense Refill  . busPIRone (BUSPAR) 10 MG tablet Take 10 mg by mouth 2 (two) times daily.    . citalopram (CELEXA) 40 MG tablet Take 40 mg by mouth daily.    . folic acid (FOLVITE) 1 MG tablet Take 1 tablet (1 mg total) by mouth daily. 30 tablet 3  . hydroxyurea (HYDREA) 500 MG capsule Take 2 capsules (1,000 mg total) by mouth daily. May take with food to minimize GI side effects.  (Patient taking differently: Take 1,000 mg by mouth 2 (two) times daily. May take with food to minimize GI side effects.) 60 capsule 0  . OLANZapine (ZYPREXA) 2.5 MG tablet Take 2.5 mg by mouth daily.    Marland Kitchen oxyCODONE-acetaminophen (PERCOCET) 5-325 MG tablet Take 1-2 tablets by mouth every 4 (four) hours as needed. (Patient taking differently: Take 2 tablets by mouth every 6 (six) hours as needed for moderate pain. ) 20 tablet 0  . PENICILLIN V POTASSIUM PO Take 1 tablet by mouth daily.    . pseudoephedrine (SUDAFED) 30 MG tablet Take 30 mg by mouth every 4 (four) hours as needed (for priapism).    Marland Kitchen ibuprofen (ADVIL,MOTRIN) 600 MG tablet Take 1 tablet (600 mg total) by mouth every 6 (six) hours as needed for moderate pain. (Patient not taking: Reported on 02/23/2017) 30 tablet 0  . meloxicam (MOBIC) 15 MG tablet Take 1 tablet (15 mg total) by mouth daily. Take 1 daily with food. (Patient not taking: Reported on 05/29/2017) 10 tablet 0    Allergies: No Known Allergies  Social History: Social History  Substance Use Topics  . Smoking status: Current Every Day Smoker    Packs/day: 0.50    Types: Cigarettes  . Smokeless tobacco: Never Used  . Alcohol use No    Family History No family history on file.  Review of Systems 10 systems were reviewed and are negative except as noted specifically in the HPI.  Objective   Vital signs in last 24 hours: BP 139/73   Pulse 62   Temp 98.7 F (37.1 C) (Oral)   Resp (!) 23   Ht 5\' 9"  (1.753 m)   Wt 77.1 kg (170 lb)   SpO2 94%   BMI 25.10 kg/m   Physical Exam General: NAD, A&O, resting, appropriate HEENT: [place]/AT, EOMI, MMM Pulmonary: Normal work of breathing Cardiovascular: HDS, adequate peripheral perfusion Abdomen: Soft, NTTP, nondistended. GU: Erect penis, circumcised, tender to palpation. Otherwise normal external genitalia.  Extremities: warm and well perfused Neuro: Appropriate, no focal neurological deficits  Most Recent Labs: Lab  Results  Component Value Date   WBC 13.5 (H) 05/29/2017   HGB 8.4 (L) 05/29/2017   HCT 22.9 (L) 05/29/2017   PLT 406 (H) 05/29/2017    Lab Results  Component Value Date   NA 139 05/29/2017   K 4.4 05/29/2017   CL 105 05/29/2017   CO2 29 05/29/2017   BUN 12 05/29/2017   CREATININE 0.86 05/29/2017   CALCIUM 9.2 05/29/2017   MG 2.0 07/06/2015    Lab Results  Component Value Date   INR 1.2 12/17/2007   APTT 30 12/17/2007     IMAGING: No results found.  ------  Assessment:  21 y.o. male with SCD and depression (on Trazodone) and recent recurrent priapism. Presents today with a painful erection lasting 6 hours. Initially on exam he was fully erect after having received 530mcg phenylephrine. After gathering supplies for irrigation, he was found to be completely flaccid.   On no other medications besides trazodone which would put him at risk for priapism.   Discussed etiology, natural hx, and treatment options. Discussed that Trazodone and SCD together likely contributing. Discussed injection with phenyephrine v irrgation v shunting. Also discussed prevention.    Recommendations: - Ok to d/c - Stopt Trazodone. This was communicated with the prison as well as the patient. This is likely contributing to his recurrent priapism.  - Continue pseudoephedrine 30mg  prn for priapism - If continues to occur despite cessation of trazodone, can consider ketoconazole and steroid taper for prevention.    Thank you for this consult. Please contact the urology consult pager with any further questions/concerns.

## 2017-05-29 NOTE — ED Provider Notes (Signed)
[facility] DEPT Provider Note   CSN: [id number] Arrival date & time: 05/29/17  1451     History   Chief Complaint Chief Complaint  Patient presents with  . Sickle Cell Pain Crisis    HPI Jeffrey Morrison is a 21 y.o. male w PMHx sickle cell crisis, presenting with acute onset of persistent sickle cell pain that began this morning. He reports pain is his typical pain in the left wrist. He reports priapism since yesterday, that was treated with drainage and medication injections. He reports priapism returned this morning and has been persistent with worsening pain. Reports 8 episodes of priapism in the past. Denies CP, SOB, abd pain, N/V/D, fever/chills, vision changes, or any other complaints today.  The history is provided by the patient.    Past Medical History:  Diagnosis Date  . Chronic lower back pain   . Sickle cell crisis [facility])     Patient Active Problem List   Diagnosis Date Noted  . CAP (community acquired pneumonia) 07/05/2015  . Sickle cell crisis acute chest syndrome ([place]) 07/05/2015  . Pain of left lower leg 11/12/2014  . Cellulitis 11/12/2014  . Acute folliculitis 15/40/0867  . Tinea cruris 11/12/2014  . Acute chest syndrome in sickle crisis ([place]) 08/08/2014  . HCAP (healthcare-associated pneumonia) 08/08/2014  . Acute respiratory failure with hypoxia ([place]) 08/08/2014  . Hypoxia   . Anemia of chronic disease   . Sickle cell pain crisis ([place]) 01/08/2014  . Sickle cell crisis ([place]) 01/08/2014  . Leukocytosis 01/08/2014  . Anemia 01/08/2014    Past Surgical History:  Procedure Laterality Date  . SPLENECTOMY         Home Medications    Prior to Admission medications   Medication Sig Start Date End Date Taking? Authorizing Provider  busPIRone (BUSPAR) 10 MG tablet Take 10 mg by mouth 2 (two) times daily.   Yes [provider]  citalopram (CELEXA) 40 MG tablet Take 40 mg by mouth daily.   Yes [provider]  folic acid (FOLVITE)  1 MG tablet Take 1 tablet (1 mg total) by mouth daily. 11/16/14  Yes Rai, Ripudeep K, MD  hydroxyurea (HYDREA) 500 MG capsule Take 2 capsules (1,000 mg total) by mouth daily. May take with food to minimize GI side effects. Patient taking differently: Take 1,000 mg by mouth 2 (two) times daily. May take with food to minimize GI side effects. 02/23/17  Yes Harris, Abigail, PA-C  OLANZapine (ZYPREXA) 2.5 MG tablet Take 2.5 mg by mouth daily.   Yes [provider]  oxyCODONE-acetaminophen (PERCOCET) 5-325 MG tablet Take 1-2 tablets by mouth every 4 (four) hours as needed. Patient taking differently: Take 2 tablets by mouth every 6 (six) hours as needed for moderate pain.  02/23/17  Yes Harris, Abigail, PA-C  PENICILLIN V POTASSIUM PO Take 1 tablet by mouth daily.   Yes [provider]  pseudoephedrine (SUDAFED) 30 MG tablet Take 30 mg by mouth every 4 (four) hours as needed (for priapism).   Yes [provider]  ibuprofen (ADVIL,MOTRIN) 600 MG tablet Take 1 tablet (600 mg total) by mouth every 6 (six) hours as needed for moderate pain. Patient not taking: Reported on 02/23/2017 02/21/17   Rodell Perna A, PA-C  meloxicam (MOBIC) 15 MG tablet Take 1 tablet (15 mg total) by mouth daily. Take 1 daily with food. Patient not taking: Reported on 05/29/2017 02/23/17   Margarita Mail, PA-C    Family History No family history on file.  Social History Social History  Substance Use Topics  . Smoking status: Current Every Day Smoker    Packs/day: 0.50    Types: Cigarettes  . Smokeless tobacco: Never Used  . Alcohol use No     Allergies   Patient has no known allergies.   Review of Systems Review of Systems  Constitutional: Negative for chills and fever.  Eyes: Negative for visual disturbance.  Respiratory: Negative for shortness of breath.   Cardiovascular: Negative for chest pain and leg swelling.  Gastrointestinal: Negative for abdominal pain, diarrhea, nausea and vomiting.    Genitourinary: Positive for penile pain.       Priapism  Musculoskeletal: Positive for myalgias.  Neurological: Negative for headaches.  All other systems reviewed and are negative.    Physical Exam Updated Vital Signs BP 118/66 (BP Location: Right Arm)   Pulse 65   Temp 98.7 F (37.1 C) (Oral)   Resp (!) 22   Ht 5\' 9"  (1.753 m)   Wt 77.1 kg (170 lb)   SpO2 93%   BMI 25.10 kg/m   Physical Exam  Constitutional: He appears well-developed and well-nourished.  HENT:  Head: Normocephalic and atraumatic.  Eyes: Pupils are equal, round, and reactive to light. Conjunctivae and EOM are normal.  Neck: Normal range of motion. Neck supple.  Cardiovascular: Normal rate, regular rhythm, normal heart sounds and intact distal pulses.  Exam reveals no friction rub.   No murmur heard. Pulmonary/Chest: Effort normal and breath sounds normal. No respiratory distress. He has no wheezes. He has no rales.  Abdominal: Soft. Bowel sounds are normal. He exhibits no distension. There is no tenderness. There is no rebound and no guarding.  Genitourinary:  Genitourinary Comments: Priapism present  Musculoskeletal:  No LE edema or tenderness  Neurological: He is alert.  Skin: Skin is warm.  Psychiatric: He has a normal mood and affect. His behavior is normal.  Nursing note and vitals reviewed.    ED Treatments / Results  Labs (all labs ordered are listed, but only abnormal results are displayed) Labs Reviewed  CBC WITH DIFFERENTIAL/PLATELET - Abnormal; Notable for the following:       Result Value   WBC 13.5 (*)    RBC 2.47 (*)    Hemoglobin 8.4 (*)    HCT 22.9 (*)    MCHC 36.7 (*)    RDW 22.7 (*)    Platelets 406 (*)    nRBC 7 (*)    Neutro Abs 9.4 (*)    Monocytes Absolute 1.5 (*)    All other components within normal limits  RETICULOCYTES - Abnormal; Notable for the following:    Retic Ct Pct 22.1 (*)    RBC. 2.47 (*)    Retic Count, Absolute 545.9 (*)    All other components  within normal limits  BASIC METABOLIC PANEL    EKG  EKG Interpretation None       Radiology No results found.  Procedures Procedures (including critical care time)  See procedure note per Dr. Duffy Bruce.  Medications Ordered in ED Medications  ondansetron (ZOFRAN) injection 4 mg (not administered)  diphenhydrAMINE (BENADRYL) capsule 25-50 mg (25 mg Oral Given 05/29/17 1557)  0.45 % sodium chloride infusion ( Intravenous Stopped 05/29/17 2110)  phenylephrine 200 mcg / ml CONC. DILUTION INJ (ED / Urology USE ONLY) (not administered)  HYDROmorphone (DILAUDID) injection 1 mg (1 mg Intravenous Not Given 05/29/17 2109)  ketorolac (TORADOL) 30 MG/ML injection 30 mg (30 mg Intravenous Given 05/29/17 1607)  HYDROmorphone (DILAUDID) injection 0.5 mg (0.5 mg Intravenous Given 05/29/17 1608)    Or  HYDROmorphone (DILAUDID) injection 0.5 mg ( Subcutaneous See Alternative 05/29/17 1608)  HYDROmorphone (DILAUDID) injection 1 mg (0.5 mg Intravenous Given 05/29/17 1645)    Or  HYDROmorphone (DILAUDID) injection 1 mg ( Subcutaneous See Alternative 05/29/17 1645)  HYDROmorphone (DILAUDID) injection 1 mg (1 mg Intravenous Given 05/29/17 1740)    Or  HYDROmorphone (DILAUDID) injection 1 mg ( Subcutaneous See Alternative 05/29/17 1740)  HYDROmorphone (DILAUDID) injection 1 mg (1 mg Intravenous Given 05/29/17 2019)    Or  HYDROmorphone (DILAUDID) injection 1 mg ( Subcutaneous See Alternative 05/29/17 2019)  phenylephrine 200 mcg / ml CONC. DILUTION INJ (ED / Urology USE ONLY) (50 mcg Intracavernosal Given 05/29/17 1853)     Initial Impression / Assessment and Plan / ED Course  I have reviewed the triage vital signs and the nursing notes.  Pertinent labs & imaging results that were available during my care of the patient were reviewed by me and considered in my medical decision making (see chart for details).     Patient presenting with sickle cell crisis. Patient with typical symptoms and pain,  however also with priapism. Pt without CP, abdominal pain, or SOB. Pt is not exhibiting signs or symptoms of acute chest syndrome, organ failure, or DVT. Pt is afebrile, hemodynamically stable. WBC within limits of typical crisis. Retic appropriately elevated. Hgb without change from baseline. Priapism treated with drainage and phenylephrine injections performed by Dr. Ellender Hose, however priapism did not resolve immediately. Urology saw patient with intent of treating priapism, however priapism resolved prior to additional treatment. Patient treated with sickle cell protocol, with improvement in symptoms. Pt tolerating PO. Pt is safe for discharge with symptomatic management. Urology referral given for recurrent priapism. Strict return precautions discussed.  Patient discussed with and seen by Dr. Ellender Hose.  Discussed results, findings, treatment and follow up. Patient advised of return precautions. Patient verbalized understanding and agreed with plan.  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

## 2017-05-29 NOTE — ED Triage Notes (Signed)
Pt BIB [place] today for reports of sickle cell pain and priapism starting this morning.Pt c/o left arm pain and priapism. Pt denies chest pain.

## 2017-05-29 NOTE — Discharge Instructions (Signed)
Please read instructions below. Stop taking trazodone, as this can contribute to the recurrent priapism. You can take over the counter Pseudoephedrine (Sudafed) 30mg  as needed for priapism.  Continue taking percocet as prescribed for sickle cell pain. Schedule an appointment within 1 week with the Urologist, to discuss preventative options for  Return to the ER for fever, chest pain, shortness of breath, priapism, or new or concerning symptoms.

## 2017-05-29 NOTE — ED Notes (Signed)
Urology in room

## 2017-05-29 NOTE — ED Notes (Signed)
Spoke with corrections nurse  Called [facility] for current medication list  If on trazodone needs to stop ASAP per provider recommendation  Medications and procedure equipment gathered for provider  Pt stable. Waiting for provider to start

## 2017-05-29 NOTE — ED Notes (Signed)
ED Provider at bedside. 

## 2017-05-30 NOTE — ED Provider Notes (Signed)
Irrigate corpus cavern, priapism Date/Time: 05/30/2017 12:08 PM Performed by: Duffy Bruce Authorized by: Duffy Bruce  Consent: Verbal consent obtained. Written consent obtained. Risks and benefits: risks, benefits and alternatives were discussed Consent given by: patient Patient understanding: patient states understanding of the procedure being performed Required items: required blood products, implants, devices, and special equipment available Patient identity confirmed: arm band Time out: Immediately prior to procedure a "time out" was called to verify the correct patient, procedure, equipment, support staff and site/side marked as required. Preparation: Patient was prepped and draped in the usual sterile fashion.  Sedation: Patient sedated: no Patient tolerance: Patient tolerated the procedure well with no immediate complications Comments: The penis was fully prepped with betadine and allowed to dry fully. Full sterile precautions were used. Following sterilization, an 18 gauge needle was introduced at positions of 3 o'clock and 9 o'clock, along the mid penile shaft, with return of dark red blood. Approx 60 cc of blood was drained, and the corpora were then injected with 500 mcg of phenylephrine (total). Pt had improvement but mild persistent of tumescence. He reports symptomatic improvement. Pressure was then held at the sites of needle entry for 5 minutes, with no further bleeding, hematoma formation, or skin breakdown.       Duffy Bruce, MD 05/30/17 1210

## 2017-05-31 ENCOUNTER — Other Ambulatory Visit: Payer: Self-pay

## 2017-05-31 ENCOUNTER — Inpatient Hospital Stay (HOSPITAL_COMMUNITY)
Admission: EM | Admit: 2017-05-31 | Discharge: 2017-06-06 | DRG: 812 | Attending: Internal Medicine | Admitting: Internal Medicine

## 2017-05-31 ENCOUNTER — Encounter (HOSPITAL_COMMUNITY): Payer: Self-pay | Admitting: Emergency Medicine

## 2017-05-31 ENCOUNTER — Emergency Department (HOSPITAL_COMMUNITY)

## 2017-05-31 DIAGNOSIS — G8929 Other chronic pain: Secondary | ICD-10-CM | POA: Diagnosis present

## 2017-05-31 DIAGNOSIS — Z9081 Acquired absence of spleen: Secondary | ICD-10-CM

## 2017-05-31 DIAGNOSIS — R079 Chest pain, unspecified: Secondary | ICD-10-CM | POA: Diagnosis not present

## 2017-05-31 DIAGNOSIS — I351 Nonrheumatic aortic (valve) insufficiency: Secondary | ICD-10-CM | POA: Diagnosis not present

## 2017-05-31 DIAGNOSIS — F17210 Nicotine dependence, cigarettes, uncomplicated: Secondary | ICD-10-CM | POA: Diagnosis present

## 2017-05-31 DIAGNOSIS — D638 Anemia in other chronic diseases classified elsewhere: Secondary | ICD-10-CM | POA: Diagnosis present

## 2017-05-31 DIAGNOSIS — N4832 Priapism due to disease classified elsewhere: Secondary | ICD-10-CM | POA: Diagnosis present

## 2017-05-31 DIAGNOSIS — D5700 Hb-SS disease with crisis, unspecified: Secondary | ICD-10-CM | POA: Diagnosis not present

## 2017-05-31 DIAGNOSIS — R011 Cardiac murmur, unspecified: Secondary | ICD-10-CM | POA: Diagnosis present

## 2017-05-31 DIAGNOSIS — R52 Pain, unspecified: Secondary | ICD-10-CM

## 2017-05-31 DIAGNOSIS — G4733 Obstructive sleep apnea (adult) (pediatric): Secondary | ICD-10-CM

## 2017-05-31 DIAGNOSIS — M545 Low back pain: Secondary | ICD-10-CM | POA: Diagnosis present

## 2017-05-31 DIAGNOSIS — I340 Nonrheumatic mitral (valve) insufficiency: Secondary | ICD-10-CM | POA: Diagnosis not present

## 2017-05-31 DIAGNOSIS — D571 Sickle-cell disease without crisis: Secondary | ICD-10-CM

## 2017-05-31 DIAGNOSIS — M25511 Pain in right shoulder: Secondary | ICD-10-CM | POA: Diagnosis not present

## 2017-05-31 DIAGNOSIS — D57219 Sickle-cell/Hb-C disease with crisis, unspecified: Secondary | ICD-10-CM | POA: Diagnosis not present

## 2017-05-31 DIAGNOSIS — N4830 Priapism, unspecified: Secondary | ICD-10-CM | POA: Diagnosis not present

## 2017-05-31 LAB — COMPREHENSIVE METABOLIC PANEL
ALBUMIN: 4.2 g/dL (ref 3.5–5.0)
ALK PHOS: 74 U/L (ref 38–126)
ALT: 31 U/L (ref 17–63)
AST: 33 U/L (ref 15–41)
Anion gap: 8 (ref 5–15)
BILIRUBIN TOTAL: 5.2 mg/dL — AB (ref 0.3–1.2)
BUN: 10 mg/dL (ref 6–20)
CALCIUM: 9.4 mg/dL (ref 8.9–10.3)
CO2: 27 mmol/L (ref 22–32)
CREATININE: 0.76 mg/dL (ref 0.61–1.24)
Chloride: 105 mmol/L (ref 101–111)
GFR calc Af Amer: >60 mL/min (ref >60)
GLUCOSE: 107 mg/dL — AB (ref 65–99)
POTASSIUM: 3.8 mmol/L (ref 3.5–5.1)
Sodium: 140 mmol/L (ref 135–145)
TOTAL PROTEIN: 7.3 g/dL (ref 6.5–8.1)

## 2017-05-31 LAB — URINALYSIS, ROUTINE W REFLEX MICROSCOPIC
BILIRUBIN URINE: NEGATIVE
GLUCOSE, UA: NEGATIVE mg/dL
HGB URINE DIPSTICK: NEGATIVE
Ketones, ur: NEGATIVE mg/dL
Leukocytes, UA: NEGATIVE
Nitrite: NEGATIVE
PROTEIN: NEGATIVE mg/dL
Specific Gravity, Urine: 1.006 (ref 1.005–1.030)
pH: 6 (ref 5.0–8.0)

## 2017-05-31 LAB — CBC WITH DIFFERENTIAL/PLATELET
BAND NEUTROPHILS: 0 %
Basophils Absolute: 0 10*3/uL (ref 0.0–0.1)
Basophils Relative: 0 %
Blasts: 0 %
EOS PCT: 4 %
Eosinophils Absolute: 0.5 10*3/uL (ref 0.0–0.7)
HEMATOCRIT: 21.7 % — AB (ref 39.0–52.0)
HEMOGLOBIN: 7.9 g/dL — AB (ref 13.0–17.0)
LYMPHS PCT: 25 %
Lymphs Abs: 3.2 10*3/uL (ref 0.7–4.0)
MCH: 33.9 pg (ref 26.0–34.0)
MCHC: 36.4 g/dL — ABNORMAL HIGH (ref 30.0–36.0)
MCV: 93.1 fL (ref 78.0–100.0)
METAMYELOCYTES PCT: 0 %
MONOS PCT: 13 %
Monocytes Absolute: 1.7 10*3/uL — ABNORMAL HIGH (ref 0.1–1.0)
Myelocytes: 0 %
Neutro Abs: 7.5 10*3/uL (ref 1.7–7.7)
Neutrophils Relative %: 58 %
Other: 0 %
Platelets: 394 10*3/uL (ref 150–400)
Promyelocytes Absolute: 0 %
RBC: 2.33 MIL/uL — AB (ref 4.22–5.81)
RDW: 24.4 % — ABNORMAL HIGH (ref 11.5–15.5)
WBC: 12.9 10*3/uL — AB (ref 4.0–10.5)
nRBC: 0 /100 WBC

## 2017-05-31 LAB — CBC
HCT: 21.2 % — ABNORMAL LOW (ref 39.0–52.0)
Hemoglobin: 7.5 g/dL — ABNORMAL LOW (ref 13.0–17.0)
MCH: 33.8 pg (ref 26.0–34.0)
MCHC: 35.4 g/dL (ref 30.0–36.0)
MCV: 95.5 fL (ref 78.0–100.0)
Platelets: 365 10*3/uL (ref 150–400)
RBC: 2.22 MIL/uL — AB (ref 4.22–5.81)
RDW: 24 % — ABNORMAL HIGH (ref 11.5–15.5)
WBC: 12.7 10*3/uL — ABNORMAL HIGH (ref 4.0–10.5)

## 2017-05-31 LAB — CREATININE, SERUM
Creatinine, Ser: 0.9 mg/dL (ref 0.61–1.24)
GFR calc Af Amer: >60 mL/min (ref >60)
GFR calc non Af Amer: >60 mL/min (ref >60)

## 2017-05-31 LAB — MRSA PCR SCREENING: MRSA BY PCR: NEGATIVE

## 2017-05-31 MED ORDER — DIPHENHYDRAMINE HCL 50 MG/ML IJ SOLN
25.0000 mg | Freq: Once | INTRAMUSCULAR | Status: AC
  Administered 2017-05-31: 25 mg via INTRAVENOUS
  Filled 2017-05-31: qty 1

## 2017-05-31 MED ORDER — ONDANSETRON HCL 4 MG/2ML IJ SOLN: 4.0000 mg | Freq: Four times a day (QID) | INTRAMUSCULAR | Status: DC | PRN

## 2017-05-31 MED ORDER — BUSPIRONE HCL 10 MG PO TABS
10.0000 mg | ORAL_TABLET | Freq: Two times a day (BID) | ORAL | Status: DC
  Administered 2017-05-31 – 2017-06-06 (×13): 10 mg via ORAL
  Filled 2017-05-31 (×13): qty 1

## 2017-05-31 MED ORDER — DIPHENHYDRAMINE HCL 50 MG/ML IJ SOLN
12.5000 mg | Freq: Four times a day (QID) | INTRAMUSCULAR | Status: DC | PRN
  Administered 2017-05-31 – 2017-06-01 (×2): 12.5 mg via INTRAVENOUS
  Filled 2017-05-31 (×2): qty 1

## 2017-05-31 MED ORDER — HYDROMORPHONE HCL 1 MG/ML IJ SOLN: 1.0000 mg | INTRAMUSCULAR | Status: AC

## 2017-05-31 MED ORDER — KETOROLAC TROMETHAMINE 30 MG/ML IJ SOLN
30.0000 mg | INTRAMUSCULAR | Status: AC
  Administered 2017-05-31: 30 mg via INTRAVENOUS
  Filled 2017-05-31: qty 1

## 2017-05-31 MED ORDER — ENOXAPARIN SODIUM 40 MG/0.4ML ~~LOC~~ SOLN
40.0000 mg | SUBCUTANEOUS | Status: DC
  Administered 2017-05-31 – 2017-06-05 (×6): 40 mg via SUBCUTANEOUS
  Filled 2017-05-31 (×6): qty 0.4

## 2017-05-31 MED ORDER — HYDROMORPHONE HCL 1 MG/ML IJ SOLN
1.0000 mg | INTRAMUSCULAR | Status: AC
  Administered 2017-05-31: 1 mg via INTRAVENOUS
  Filled 2017-05-31: qty 1

## 2017-05-31 MED ORDER — PHENYLEPHRINE 200 MCG/ML FOR PRIAPISM / HYPOTENSION
200.0000 ug | INTRAMUSCULAR | Status: DC | PRN
  Administered 2017-05-31: 200 ug via INTRACAVERNOUS
  Filled 2017-05-31: qty 50

## 2017-05-31 MED ORDER — PSEUDOEPHEDRINE HCL 30 MG PO TABS
30.0000 mg | ORAL_TABLET | ORAL | Status: DC | PRN
  Administered 2017-06-02 – 2017-06-06 (×9): 30 mg via ORAL
  Filled 2017-05-31 (×10): qty 1

## 2017-05-31 MED ORDER — HYDROMORPHONE HCL 1 MG/ML IJ SOLN
0.5000 mg | INTRAMUSCULAR | Status: AC
  Administered 2017-05-31: 0.5 mg via INTRAVENOUS
  Filled 2017-05-31: qty 1

## 2017-05-31 MED ORDER — HYDROMORPHONE HCL 1 MG/ML IJ SOLN: 0.5000 mg | INTRAMUSCULAR | Status: AC

## 2017-05-31 MED ORDER — POLYETHYLENE GLYCOL 3350 17 G PO PACK: 17.0000 g | PACK | Freq: Every day | ORAL | Status: DC | PRN

## 2017-05-31 MED ORDER — KETOROLAC TROMETHAMINE 30 MG/ML IJ SOLN
30.0000 mg | Freq: Four times a day (QID) | INTRAMUSCULAR | Status: AC
  Administered 2017-05-31 – 2017-06-05 (×19): 30 mg via INTRAVENOUS
  Filled 2017-05-31 (×19): qty 1

## 2017-05-31 MED ORDER — FOLIC ACID 1 MG PO TABS
1.0000 mg | ORAL_TABLET | Freq: Every day | ORAL | Status: DC
  Administered 2017-05-31 – 2017-06-06 (×7): 1 mg via ORAL
  Filled 2017-05-31 (×7): qty 1

## 2017-05-31 MED ORDER — NALOXONE HCL 0.4 MG/ML IJ SOLN: 0.4000 mg | INTRAMUSCULAR | Status: DC | PRN

## 2017-05-31 MED ORDER — HYDROMORPHONE 1 MG/ML IV SOLN
INTRAVENOUS | Status: DC
  Administered 2017-05-31: 14:00:00 via INTRAVENOUS
  Administered 2017-05-31: 2.6 mg via INTRAVENOUS
  Administered 2017-05-31: 0.6 mg via INTRAVENOUS
  Administered 2017-06-01: 2.1 mg via INTRAVENOUS
  Administered 2017-06-01: 2.8 mg via INTRAVENOUS
  Filled 2017-05-31: qty 25

## 2017-05-31 MED ORDER — SODIUM CHLORIDE 0.9% FLUSH: 9.0000 mL | INTRAVENOUS | Status: DC | PRN

## 2017-05-31 MED ORDER — DIPHENHYDRAMINE HCL 12.5 MG/5ML PO ELIX
12.5000 mg | ORAL_SOLUTION | Freq: Four times a day (QID) | ORAL | Status: DC | PRN
  Administered 2017-05-31: 12.5 mg via ORAL
  Filled 2017-05-31: qty 5

## 2017-05-31 MED ORDER — SODIUM CHLORIDE 0.45 % IV SOLN
INTRAVENOUS | Status: DC
  Administered 2017-05-31 (×2): via INTRAVENOUS

## 2017-05-31 MED ORDER — CITALOPRAM HYDROBROMIDE 20 MG PO TABS
40.0000 mg | ORAL_TABLET | Freq: Every day | ORAL | Status: DC
  Administered 2017-05-31 – 2017-06-06 (×7): 40 mg via ORAL
  Filled 2017-05-31 (×7): qty 2

## 2017-05-31 MED ORDER — OLANZAPINE 2.5 MG PO TABS
2.5000 mg | ORAL_TABLET | Freq: Every day | ORAL | Status: DC
  Administered 2017-05-31 – 2017-06-06 (×7): 2.5 mg via ORAL
  Filled 2017-05-31 (×7): qty 1

## 2017-05-31 MED ORDER — LIDOCAINE HCL (PF) 2 % IJ SOLN
INTRAMUSCULAR | Status: AC
  Administered 2017-05-31: 08:00:00
  Filled 2017-05-31: qty 10

## 2017-05-31 MED ORDER — DEXTROSE-NACL 5-0.45 % IV SOLN
INTRAVENOUS | Status: DC
  Administered 2017-05-31 – 2017-06-04 (×6): via INTRAVENOUS

## 2017-05-31 MED ORDER — LIDOCAINE HCL (PF) 1 % IJ SOLN: 5.0000 mL | Freq: Once | INTRAMUSCULAR | Status: DC

## 2017-05-31 MED ORDER — SENNOSIDES-DOCUSATE SODIUM 8.6-50 MG PO TABS
1.0000 | ORAL_TABLET | Freq: Two times a day (BID) | ORAL | Status: DC
  Administered 2017-05-31 – 2017-06-06 (×13): 1 via ORAL
  Filled 2017-05-31 (×13): qty 1

## 2017-05-31 MED ORDER — HYDROXYUREA 500 MG PO CAPS
1000.0000 mg | ORAL_CAPSULE | Freq: Every day | ORAL | Status: DC
  Administered 2017-05-31 – 2017-06-01 (×2): 1000 mg via ORAL
  Filled 2017-05-31 (×2): qty 2

## 2017-05-31 NOTE — ED Provider Notes (Signed)
[facility] DEPT Provider Note   CSN: [id number] Arrival date & time: 05/31/17  4034     History   Chief Complaint Chief Complaint  Patient presents with  . Priapism    HPI Jeffrey Morrison is a 21 y.o. male.  HPI Patient presents with sickle cell pain. Pain is somewhat diffuse in his chest. Also priapism. Seen 2 days ago for the same. Had drainage for the priapism 2 days ago. Had to have urology consult but had detumesced before second drainage needed. Has had priapism 8 times in the past. Has recently seen urology in the ER and recommended phenylephrine orally when necessary  and trazodone to be stopped. It is unclear if this is happened. Has 5 mg oxycodone use for pain. Pain has been uncontrolled. States she has been able to urinate. Erection started around 1 in the morning. Past Medical History:  Diagnosis Date  . Chronic lower back pain   . Sickle cell crisis [facility])     Patient Active Problem List   Diagnosis Date Noted  . CAP (community acquired pneumonia) 07/05/2015  . Sickle cell crisis acute chest syndrome ([place]) 07/05/2015  . Pain of left lower leg 11/12/2014  . Cellulitis 11/12/2014  . Acute folliculitis 74/25/9563  . Tinea cruris 11/12/2014  . Acute chest syndrome in sickle crisis ([place]) 08/08/2014  . HCAP (healthcare-associated pneumonia) 08/08/2014  . Acute respiratory failure with hypoxia ([place]) 08/08/2014  . Hypoxia   . Anemia of chronic disease   . Sickle cell pain crisis ([place]) 01/08/2014  . Sickle cell crisis ([place]) 01/08/2014  . Leukocytosis 01/08/2014  . Anemia 01/08/2014    Past Surgical History:  Procedure Laterality Date  . SPLENECTOMY         Home Medications    Prior to Admission medications   Medication Sig Start Date End Date Taking? Authorizing Provider  busPIRone (BUSPAR) 10 MG tablet Take 10 mg by mouth 2 (two) times daily.   Yes [provider]  citalopram (CELEXA) 40 MG tablet Take 40 mg by mouth daily.   Yes [provider]  folic acid (FOLVITE) 1 MG tablet Take 1 tablet (1 mg total) by mouth daily. 11/16/14  Yes Rai, Ripudeep K, MD  hydroxyurea (HYDREA) 500 MG capsule Take 2 capsules (1,000 mg total) by mouth daily. May take with food to minimize GI side effects. Patient taking differently: Take 1,000 mg by mouth 2 (two) times daily. May take with food to minimize GI side effects. 02/23/17  Yes Harris, Abigail, PA-C  OLANZapine (ZYPREXA) 2.5 MG tablet Take 2.5 mg by mouth daily.   Yes [provider]  pseudoephedrine (SUDAFED) 30 MG tablet Take 30 mg by mouth every 4 (four) hours as needed (for priapism).   Yes [provider]  PENICILLIN V POTASSIUM PO Take 1 tablet by mouth daily.    [provider]    Family History History reviewed. No pertinent family history.  Social History Social History  Substance Use Topics  . Smoking status: Current Every Day Smoker    Packs/day: 0.50    Types: Cigarettes  . Smokeless tobacco: Never Used  . Alcohol use No     Allergies   Patient has no known allergies.   Review of Systems Review of Systems  Constitutional: Negative for appetite change and fever.  Respiratory: Negative for cough and shortness of breath.   Cardiovascular: Positive for chest pain.  Gastrointestinal: Negative for abdominal pain.  Genitourinary:  Priapism  Skin: Negative for rash and wound.  Neurological: Negative for syncope.  Hematological: Negative for adenopathy.  Psychiatric/Behavioral: Negative for confusion.     Physical Exam Updated Vital Signs BP (!) 112/52   Pulse (!) 59   Temp 98.4 F (36.9 C) (Oral)   Resp 15   Ht 5\' 8"  (1.727 m)   Wt 81.6 kg (180 lb)   SpO2 98%   BMI 27.37 kg/m   Physical Exam  Constitutional: He appears well-developed.  HENT:  Head: Atraumatic.  Eyes: Pupils are equal, round, and reactive to light.  Neck: Neck supple.  Cardiovascular: Normal rate.   Pulmonary/Chest: Effort normal.  Abdominal:  There is no tenderness.  Genitourinary:  Genitourinary Comments: Penis is erect with some thickening of the mid shaft that patient states is a site of his previous injections for drainage.  Musculoskeletal: Normal range of motion.  Neurological: He is alert.  Skin: Skin is warm. Capillary refill takes less than 2 seconds.     ED Treatments / Results  Labs (all labs ordered are listed, but only abnormal results are displayed) Labs Reviewed  CBC WITH DIFFERENTIAL/PLATELET - Abnormal; Notable for the following:       Result Value   WBC 12.9 (*)    RBC 2.33 (*)    Hemoglobin 7.9 (*)    HCT 21.7 (*)    MCHC 36.4 (*)    RDW 24.4 (*)    All other components within normal limits  COMPREHENSIVE METABOLIC PANEL - Abnormal; Notable for the following:    Glucose, Bld 107 (*)    Total Bilirubin 5.2 (*)    All other components within normal limits  URINALYSIS, ROUTINE W REFLEX MICROSCOPIC    EKG  EKG Interpretation None       Radiology Dg Chest Portable 1 View  Result Date: 05/31/2017 CLINICAL DATA:  Chest pain this morning. History of sickle cell anemia. EXAM: PORTABLE CHEST 1 VIEW COMPARISON:  February 23, 2017 FINDINGS: Mild cardiomegaly. The hila and mediastinum are normal. No pulmonary nodules or masses. No focal infiltrates. No acute abnormalities. IMPRESSION: No active disease. Electronically Signed   By: Dorise Bullion III M.D   On: 05/31/2017 07:47    Procedures Irrigate corpus cavern, priapism Date/Time: 05/31/2017 9:00 AM Performed by: Davonna Belling Authorized by: Davonna Belling  Consent: Verbal consent obtained. Written consent not obtained. Consent given by: patient Patient understanding: patient states understanding of the procedure being performed Required items: required blood products, implants, devices, and special equipment available Patient identity confirmed: verbally with patient Time out: Immediately prior to procedure a "time out" was called to verify  the correct patient, procedure, equipment, support staff and site/side marked as required. Preparation: Patient was prepped and draped in the usual sterile fashion. Local anesthesia used: yes Anesthesia: local infiltration  Anesthesia: Local anesthesia used: yes Local Anesthetic: lidocaine 1% without epinephrine Anesthetic total: 2 mL  Sedation: Patient sedated: no Patient tolerance: Patient tolerated the procedure well with no immediate complications Comments: Patient had 30 mL of blood removed from his penis. Then had 500 g of phenylephrine infusion. Had detumescence and patient tolerated well.    (including critical care time)  Medications Ordered in ED Medications  0.45 % sodium chloride infusion ( Intravenous New Bag/Given 05/31/17 0837)  HYDROmorphone (DILAUDID) injection 1 mg (not administered)    Or  HYDROmorphone (DILAUDID) injection 1 mg (not administered)  phenylephrine 200 mcg / ml CONC. DILUTION INJ (ED / Urology USE ONLY) (200 mcg Intracavernosal  Given by Other 05/31/17 0825)  lidocaine (PF) (XYLOCAINE) 1 % injection 5 mL (not administered)  ketorolac (TORADOL) 30 MG/ML injection 30 mg (30 mg Intravenous Given 05/31/17 0748)  HYDROmorphone (DILAUDID) injection 0.5 mg (0.5 mg Intravenous Given 05/31/17 0749)    Or  HYDROmorphone (DILAUDID) injection 0.5 mg ( Subcutaneous See Alternative 05/31/17 0749)  HYDROmorphone (DILAUDID) injection 1 mg (1 mg Intravenous Given 05/31/17 0837)    Or  HYDROmorphone (DILAUDID) injection 1 mg ( Subcutaneous See Alternative 05/31/17 0837)  HYDROmorphone (DILAUDID) injection 1 mg (1 mg Intravenous Given 05/31/17 0916)    Or  HYDROmorphone (DILAUDID) injection 1 mg ( Subcutaneous See Alternative 05/31/17 0916)  lidocaine (XYLOCAINE) 2 % injection (  Given by Other 05/31/17 0825)  diphenhydrAMINE (BENADRYL) injection 25 mg (25 mg Intravenous Given 05/31/17 0939)     Initial Impression / Assessment and Plan / ED Course  I have reviewed the  triage vital signs and the nursing notes.  Pertinent labs & imaging results that were available during my care of the patient were reviewed by me and considered in my medical decision making (see chart for details).     Patient with sickle cell pain crisis. Pain in lower back and chest. Labs reassuring. Chest x-ray reassuring. Also had priapism. Drained by myself with detumescence. Continued pain after treatment. Will admit to sickle cell medicine.    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

## 2017-05-31 NOTE — ED Triage Notes (Signed)
[facility] for evaluation of priapism that started at 0100 this morning. Pt reports prior hx of sickle cell crisis.

## 2017-05-31 NOTE — H&P (Signed)
Jeffrey Morrison is an 21 y.o. male.   Chief Complaint: pain all over HPI: patient is a 21 year old gentleman  Br[facility] in [facility] jail.  He has history of sickle cell disease.  Patient has episode of Priapsm and was brought to ER yesterday were he was treated by Urology.  He was brought back again because it came back.   In the ER today he had drainage of his penis as well as injection of epinephrine.  This has resolved his priapsm but patient continues to have significant pain all over consitent with his sickle cell crisis.   He is being admitted therefore for treatment.  Pain is said to be 10 out of 10 in t and legs.  Not relievable with the Dilaudid in the emergency room  Which was given 3 times.  No fever or chills and no nausea vomiting or diarrhea.  Past Medical History:  Diagnosis Date  . Chronic lower back pain   . Sickle cell crisis [facility])     Past Surgical History:  Procedure Laterality Date  . SPLENECTOMY      History reviewed. No pertinent family history. Social History:  reports that he has been smoking Cigarettes.  He has been smoking about 0.50 packs per day. He has never used smokeless tobacco. He reports that he does not drink alcohol or use drugs.  Allergies: No Known Allergies   (Not in a hospital admission)  Results for orders placed or performed during the hospital encounter of 05/31/17 (from the past 48 hour(s))  CBC WITH DIFFERENTIAL     Status: Abnormal   Collection Time: 05/31/17  7:42 AM  Result Value Ref Range   WBC 12.9 (H) 4.0 - 10.5 K/uL    Comment: ADJUSTED FOR NUCLEATED RBC'S 22 NRBCS    RBC 2.33 (L) 4.22 - 5.81 MIL/uL   Hemoglobin 7.9 (L) 13.0 - 17.0 g/dL   HCT 21.7 (L) 39.0 - 52.0 %   MCV 93.1 78.0 - 100.0 fL   MCH 33.9 26.0 - 34.0 pg   MCHC 36.4 (H) 30.0 - 36.0 g/dL   RDW 24.4 (H) 11.5 - 15.5 %   Platelets 394 150 - 400 K/uL    Comment: RESULT REPEATED AND VERIFIED SPECIMEN CHECKED FOR CLOTS PLATELET COUNT CONFIRMED BY SMEAR    Neutrophils  Relative % 58 %   Lymphocytes Relative 25 %   Monocytes Relative 13 %   Eosinophils Relative 4 %   Basophils Relative 0 %   Band Neutrophils 0 %   Metamyelocytes Relative 0 %   Myelocytes 0 %   Promyelocytes Absolute 0 %   Blasts 0 %   nRBC 0 0 /100 WBC   Other 0 %   Neutro Abs 7.5 1.7 - 7.7 K/uL   Lymphs Abs 3.2 0.7 - 4.0 K/uL   Monocytes Absolute 1.7 (H) 0.1 - 1.0 K/uL   Eosinophils Absolute 0.5 0.0 - 0.7 K/uL   Basophils Absolute 0.0 0.0 - 0.1 K/uL   RBC Morphology SICKLE CELLS     Comment: TARGET CELLS POLYCHROMASIA PRESENT   Comprehensive metabolic panel     Status: Abnormal   Collection Time: 05/31/17  7:42 AM  Result Value Ref Range   Sodium 140 135 - 145 mmol/L   Potassium 3.8 3.5 - 5.1 mmol/L   Chloride 105 101 - 111 mmol/L   CO2 27 22 - 32 mmol/L   Glucose, Bld 107 (H) 65 - 99 mg/dL   BUN 10 6 - 20  mg/dL   Creatinine, Ser 0.76 0.61 - 1.24 mg/dL   Calcium 9.4 8.9 - 10.3 mg/dL   Total Protein 7.3 6.5 - 8.1 g/dL   Albumin 4.2 3.5 - 5.0 g/dL   AST 33 15 - 41 U/L   ALT 31 17 - 63 U/L   Alkaline Phosphatase 74 38 - 126 U/L   Total Bilirubin 5.2 (H) 0.3 - 1.2 mg/dL   GFR calc non Af Amer >60 >60 mL/min   GFR calc Af Amer >60 >60 mL/min    Comment: (NOTE) The eGFR has been calculated using the CKD EPI equation. This calculation has not been validated in all clinical situations. eGFR's persistently <60 mL/min signify possible Chronic Kidney Disease.    Anion gap 8 5 - 15   Dg Chest Portable 1 View  Result Date: 05/31/2017 CLINICAL DATA:  Chest pain this morning. History of sickle cell anemia. EXAM: PORTABLE CHEST 1 VIEW COMPARISON:  February 23, 2017 FINDINGS: Mild cardiomegaly. The hila and mediastinum are normal. No pulmonary nodules or masses. No focal infiltrates. No acute abnormalities. IMPRESSION: No active disease. Electronically Signed   By: Dorise Bullion III M.D   On: 05/31/2017 07:47    Review of Systems  Constitutional: Positive for malaise/fatigue.   HENT: Negative.   Eyes: Negative.   Respiratory: Negative.   Cardiovascular: Negative.   Gastrointestinal: Negative.   Genitourinary: Negative for urgency.  Musculoskeletal: Positive for joint pain and myalgias.  Skin: Negative.   Neurological: Negative.   Endo/Heme/Allergies: Negative.   Psychiatric/Behavioral: Negative.     Blood pressure (!) 120/56, pulse 68, temperature 98.4 F (36.9 C), temperature source Oral, resp. rate 16, height '5\' 8"'  (1.727 m), weight 81.6 kg (180 lb), SpO2 95 %. Physical Exam  Constitutional: He is oriented to person, place, and time. He appears well-developed and well-nourished.  HENT:  Head: Normocephalic and atraumatic.  Eyes: Pupils are equal, round, and reactive to light. Conjunctivae are normal.  Neck: Normal range of motion. Neck supple.  Cardiovascular: Normal rate, regular rhythm and normal heart sounds.   Respiratory: Effort normal and breath sounds normal.  GI: Soft. Bowel sounds are normal.  Genitourinary: Penis normal. No penile tenderness.  Musculoskeletal: Normal range of motion.  Neurological: He is alert and oriented to person, place, and time.  Skin: Skin is warm and dry.     Assessment/Plan A 21 year old gentleman presents with sickle cell crisis and Priapsm.  #1 sickle cell crisis: Patient will be admitted for pain management.  Start IV Dilaudid PCA with Toradol and IV fluids.  Initiate oral medications as needed.   #2 Priapsm: Appears controlled at the moment after procedures in the ER.  Sudafed ordered for when necessary  #3 anemia of chronic disease: H&H appears to be at baseline I will continue mo  #4 anxiety disorder: Continue Georgeanna Harrison, MD 05/31/2017, 11:09 AM

## 2017-06-01 DIAGNOSIS — D571 Sickle-cell disease without crisis: Secondary | ICD-10-CM

## 2017-06-01 DIAGNOSIS — D638 Anemia in other chronic diseases classified elsewhere: Secondary | ICD-10-CM

## 2017-06-01 DIAGNOSIS — N4832 Priapism due to disease classified elsewhere: Secondary | ICD-10-CM

## 2017-06-01 DIAGNOSIS — G4733 Obstructive sleep apnea (adult) (pediatric): Secondary | ICD-10-CM

## 2017-06-01 LAB — CBC WITH DIFFERENTIAL/PLATELET
Basophils Absolute: 0 10*3/uL (ref 0.0–0.1)
Basophils Relative: 0 %
EOS ABS: 0.9 10*3/uL — AB (ref 0.0–0.7)
Eosinophils Relative: 6 %
HCT: 18.7 % — ABNORMAL LOW (ref 39.0–52.0)
HEMOGLOBIN: 6.9 g/dL — AB (ref 13.0–17.0)
LYMPHS ABS: 3.4 10*3/uL (ref 0.7–4.0)
Lymphocytes Relative: 23 %
MCH: 35.4 pg — AB (ref 26.0–34.0)
MCHC: 36.9 g/dL — AB (ref 30.0–36.0)
MCV: 95.9 fL (ref 78.0–100.0)
MONO ABS: 1.9 10*3/uL — AB (ref 0.1–1.0)
Monocytes Relative: 13 %
NEUTROS ABS: 8.7 10*3/uL — AB (ref 1.7–7.7)
NRBC: 30 /100{WBCs} — AB
Neutrophils Relative %: 58 %
PLATELETS: 307 10*3/uL (ref 150–400)
RBC: 1.95 MIL/uL — AB (ref 4.22–5.81)
RDW: 23.5 % — AB (ref 11.5–15.5)
WBC: 14.9 10*3/uL — AB (ref 4.0–10.5)

## 2017-06-01 LAB — COMPREHENSIVE METABOLIC PANEL
ALBUMIN: 3.5 g/dL (ref 3.5–5.0)
ALK PHOS: 62 U/L (ref 38–126)
ALT: 32 U/L (ref 17–63)
AST: 34 U/L (ref 15–41)
Anion gap: 6 (ref 5–15)
BILIRUBIN TOTAL: 3.1 mg/dL — AB (ref 0.3–1.2)
BUN: 11 mg/dL (ref 6–20)
CALCIUM: 8.5 mg/dL — AB (ref 8.9–10.3)
CO2: 29 mmol/L (ref 22–32)
CREATININE: 0.91 mg/dL (ref 0.61–1.24)
Chloride: 106 mmol/L (ref 101–111)
GFR calc Af Amer: >60 mL/min (ref >60)
GLUCOSE: 103 mg/dL — AB (ref 65–99)
POTASSIUM: 4.3 mmol/L (ref 3.5–5.1)
Sodium: 141 mmol/L (ref 135–145)
TOTAL PROTEIN: 6.6 g/dL (ref 6.5–8.1)

## 2017-06-01 LAB — RETICULOCYTES
RBC.: 1.96 MIL/uL — AB (ref 4.22–5.81)
RETIC CT PCT: 16.9 % — AB (ref 0.4–3.1)
Retic Count, Absolute: 331.2 10*3/uL — ABNORMAL HIGH (ref 19.0–186.0)

## 2017-06-01 LAB — PREPARE RBC (CROSSMATCH)

## 2017-06-01 MED ORDER — ONDANSETRON HCL 4 MG/2ML IJ SOLN
4.0000 mg | Freq: Four times a day (QID) | INTRAMUSCULAR | Status: DC | PRN
  Administered 2017-06-02: 4 mg via INTRAVENOUS

## 2017-06-01 MED ORDER — HYDROMORPHONE 1 MG/ML IV SOLN
INTRAVENOUS | Status: DC
  Administered 2017-06-01: 4.5 mg via INTRAVENOUS
  Administered 2017-06-01: 23:00:00 via INTRAVENOUS
  Administered 2017-06-01: 3 mg via INTRAVENOUS
  Administered 2017-06-02: 5 mg via INTRAVENOUS
  Administered 2017-06-02 (×2): 3 mg via INTRAVENOUS
  Administered 2017-06-02: 5 mg via INTRAVENOUS
  Administered 2017-06-02: 2 mg via INTRAVENOUS
  Administered 2017-06-03: 0.5 mg via INTRAVENOUS
  Administered 2017-06-03: 3 mg via INTRAVENOUS
  Administered 2017-06-03: 3.5 mg via INTRAVENOUS
  Administered 2017-06-03: 4 mg via INTRAVENOUS
  Administered 2017-06-03: 5.5 mg via INTRAVENOUS
  Administered 2017-06-03: 08:00:00 via INTRAVENOUS
  Administered 2017-06-04: 3 mg via INTRAVENOUS
  Administered 2017-06-04: 3.5 mg via INTRAVENOUS
  Filled 2017-06-01 (×2): qty 25

## 2017-06-01 MED ORDER — NALOXONE HCL 0.4 MG/ML IJ SOLN: 0.4000 mg | INTRAMUSCULAR | Status: DC | PRN

## 2017-06-01 MED ORDER — HYDROXYUREA 500 MG PO CAPS
500.0000 mg | ORAL_CAPSULE | Freq: Once | ORAL | Status: AC
  Administered 2017-06-01: 500 mg via ORAL
  Filled 2017-06-01: qty 1

## 2017-06-01 MED ORDER — ACETAMINOPHEN 325 MG PO TABS
650.0000 mg | ORAL_TABLET | ORAL | Status: DC | PRN
  Administered 2017-06-01: 650 mg via ORAL
  Filled 2017-06-01: qty 2

## 2017-06-01 MED ORDER — DIPHENHYDRAMINE HCL 25 MG PO CAPS
25.0000 mg | ORAL_CAPSULE | Freq: Four times a day (QID) | ORAL | Status: DC | PRN
  Administered 2017-06-01 – 2017-06-06 (×10): 50 mg via ORAL
  Filled 2017-06-01 (×11): qty 2

## 2017-06-01 MED ORDER — HYDROMORPHONE HCL-NACL 0.5-0.9 MG/ML-% IV SOSY
1.0000 mg | PREFILLED_SYRINGE | Freq: Once | INTRAVENOUS | Status: AC
  Administered 2017-06-01: 1 mg via INTRAVENOUS

## 2017-06-01 MED ORDER — HYDROXYUREA 500 MG PO CAPS
1500.0000 mg | ORAL_CAPSULE | ORAL | Status: DC
  Administered 2017-06-03 – 2017-06-05 (×2): 1500 mg via ORAL
  Filled 2017-06-01 (×2): qty 3

## 2017-06-01 MED ORDER — SODIUM CHLORIDE 0.9 % IV SOLN: Freq: Once | INTRAVENOUS | Status: DC

## 2017-06-01 MED ORDER — HYDROXYUREA 500 MG PO CAPS
1000.0000 mg | ORAL_CAPSULE | ORAL | Status: DC
  Administered 2017-06-02 – 2017-06-06 (×3): 1000 mg via ORAL
  Filled 2017-06-01 (×3): qty 2

## 2017-06-01 MED ORDER — SODIUM CHLORIDE 0.9% FLUSH: 9.0000 mL | INTRAVENOUS | Status: DC | PRN

## 2017-06-01 NOTE — Care Management Important Message (Signed)
Important Message  Patient Details  Name: JERONE CUDMORE MRN: [id number] Date of Birth: 12-31-1995   Medicare Important Message Given:  Yes    Kerin Salen 06/01/2017, 9:21 AMImportant Message  Patient Details  Name: GUSTIN ZOBRIST MRN: [id number] Date of Birth: 11-04-1995   Medicare Important Message Given:  Yes    Kerin Salen 06/01/2017, 9:21 AM

## 2017-06-01 NOTE — Progress Notes (Signed)
PHARMACIST - PHYSICIAN COMMUNICATION  Key Points: Use following P&T approved IV to PO diphenhydramine (Benadryl) policy. Description contains the criteria that are approved  DR:   Zigmund Daniel CONCERNING: IV to Oral Route Change Policy  RECOMMENDATION: This patient is receiving diphenhydramine by the intravenous route.  Based on criteria approved by the Pharmacy and Therapeutics Committee, intravenous diphenhydramine is being converted to the equivalent oral dose form(s).   DESCRIPTION: These criteria include:  Diphenhydramine is not prescribed to treat or prevent a severe allergic reaction  Diphenhydramine is not prescribed as premedication prior to receiving blood product, biologic medication, antimicrobial, or chemotherapy agent  The patient has tolerated at least one dose of an oral or enteral medication  The patient has no evidence of active gastrointestinal bleeding or impaired GI absorption (gastrectomy, short bowel, patient on TNA or NPO).  The patient is not undergoing procedural sedation   If you have questions about this conversion, please contact the Pharmacy Department  []   [phone])  [facility] PharmD, [place] Pager [phone] 06/01/2017 10:52 AM

## 2017-06-01 NOTE — Progress Notes (Signed)
CRITICAL VALUE ALERT  Critical Value:  hgb 6.9  Date & Time Notied: 06/01/17  Provider Notified: [facility]   Orders Received/Actions taken:

## 2017-06-01 NOTE — Care Management Note (Signed)
Case Management Note  Patient Details  Name: LYNDA CAPISTRAN MRN: [id number] Date of Birth: 1996-01-17  Subjective/Objective:                   21 year old gentleman  [facility].  He has history of sickle cell disease.  Patient has episode of Priapsm and was brought to ER yesterday were he was treated by Urology.  He was brought back again because it came back.   In the ER today he had drainage of his penis as well as injection of epinephrine.  This has resolved his priapsm but patient continues to have significant pain all over consitent with his sickle cell crisis.   He is being admitted therefore for treatment.  Pain is said to be 10 out of 10 in t and legs.  Not relievable with the Dilaudid in the emergency room  Which was given 3 times.  No fever or chills and no nausea vomiting or diarrhea.  Action/Plan: Date:  June 01, 2017 Chart reviewed for concurrent status and case management needs.  Will continue to follow patient progress.  Discharge Planning: following for needs  Expected discharge date: [id number]  Velva Harman, BSN, [place], [place]   Expected Discharge Date:                  Expected Discharge Plan:  Home/Self Care  In-House Referral:     Discharge planning Services  CM Consult  Post Acute Care Choice:    Choice offered to:     DME Arranged:    DME Agency:     HH Arranged:    HH Agency:     Status of Service:  In process, will continue to follow  If discussed at Long Length of Stay Meetings, dates discussed:    Additional Comments:  Leeroy Cha, RN 06/01/2017, 9:18 AM

## 2017-06-01 NOTE — Progress Notes (Signed)
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 Will transfuse 1 unit RBC's particularly since his last Hb Elactrophresis in 2015 showed Hb S%84.2 and he has not received any transfusions since.  2. Hb SS with crisis: Change PCA to Dilaudid custom dose with bolus 0.5 mg, lockout 10 minutes and 1 hour limit 3 mg. Continue Toradol. Will increase Hydrea to 1500 mg alternating with 1000 mg which will get him to a therapeutic dosing.  3. Anemia of Chronic Disease: Will increase Hydrea and transfuse 1 unit RBC's today.  4. Psychiatric History: Pt is on Zyprexa however he is unclear of the indication. No mention in available records.  5. OSA: Pt refused CPAP and states that he has uses Oxygen at night.    Code Status: Full Code Family Communication: [facility] present in room. Disposition Plan: Not yet ready for discharge  Laiah Pouncey A.  Pager [phone]. If 7PM-7AM, please contact night-coverage.  06/01/2017, 11:18 AM  LOS: 1 day   Interim History: Pt is remotely familiar to me from previous admissions. He reports another episode of priapism this morning which lasted about 30 minutes and resolved spontaneously. Currently he has no priapism. He reports that he has been having frequent episodes and that when he has these episodes, he sometimes has blood discharged from the meatus. However this is not currently occurring. He also has pain in the right shoulder at an intensity of 8/10 and intermittently in the low back. He has used 11.4 mg with 66/33:demands/deliveries since admission.   Consultants:  None  Procedures:  None  Antibiotics:  None    Objective: Vitals:   05/31/17 2049 06/01/17 0017  06/01/17 0500 06/01/17 0816  BP: (!) 107/59 (!) 103/54 (!) 114/48   Pulse: (!) 55 (!) 56 61   Resp: 14 12 16 14   Temp: 97.9 F (36.6 C) 98.3 F (36.8 C) 98.2 F (36.8 C)   TempSrc: Oral Oral Oral   SpO2: 96% 95% 93% 93%  Weight:      Height:       Weight change: -4.536 kg (-10 lb)  Intake/Output Summary (Last 24 hours) at 06/01/17 1118 Last data filed at 06/01/17 0505  Gross per 24 hour  Intake          3447.92 ml  Output              200 ml  Net          3247.92 ml      Physical Exam General: Alert, awake, oriented x3, in no apparent distress.  HEENT: [place]/AT PEERL, EOMI, anicteric Neck: Trachea midline,  no masses, no thyromegal,y no JVD, no carotid bruit OROPHARYNX:  Moist, No exudate/ erythema/lesions.  Heart: Regular rate and rhythm, without murmurs, rubs, gallops, PMI non-displaced, no heaves or thrills on palpation.  Lungs: Clear to auscultation, no wheezing or rhonchi noted. No increased vocal fremitus resonant to percussion  Abdomen: Soft, nontender, nondistended, positive bowel sounds, no masses no hepatosplenomegaly noted.  Neuro: No focal neurological deficits noted cranial nerves II through XII grossly intact.  Strength at baseline in bilateral upper and lower extremities. Musculoskeletal: No warmth swelling or erythema around joints, no spinal tenderness noted. Psychiatric: Patient alert and oriented x3, good insight and  cognition, good recent to remote recall. Genitalia: Penis engorged but not erect.    Data Reviewed: Basic Metabolic Panel:  Recent Labs Lab 05/29/17 1606 05/31/17 0742 05/31/17 1236 06/01/17 0605  NA 139 140  --  141  K 4.4 3.8  --  4.3  CL 105 105  --  106  CO2 29 27  --  29  GLUCOSE 96 107*  --  103*  BUN 12 10  --  11  CREATININE 0.86 0.76 0.90 0.91  CALCIUM 9.2 9.4  --  8.5*   Liver Function Tests:  Recent Labs Lab 05/31/17 0742 06/01/17 0605  AST 33 34  ALT 31 32  ALKPHOS 74 62  BILITOT 5.2* 3.1*  PROT 7.3 6.6   ALBUMIN 4.2 3.5   No results for input(s): LIPASE, AMYLASE in the last 168 hours. No results for input(s): AMMONIA in the last 168 hours. CBC:  Recent Labs Lab 05/29/17 1606 05/31/17 0742 05/31/17 1236 06/01/17 0605  WBC 13.5* 12.9* 12.7* 14.9*  NEUTROABS 9.4* 7.5  --  8.7*  HGB 8.4* 7.9* 7.5* 6.9*  HCT 22.9* 21.7* 21.2* 18.7*  MCV 92.7 93.1 95.5 95.9  PLT 406* 394 365 307   Cardiac Enzymes: No results for input(s): CKTOTAL, CKMB, CKMBINDEX, TROPONINI in the last 168 hours. BNP (last 3 results) No results for input(s): BNP in the last 8760 hours.  ProBNP (last 3 results) No results for input(s): PROBNP in the last 8760 hours.  CBG: No results for input(s): GLUCAP in the last 168 hours.  Recent Results (from the past 240 hour(s))  MRSA PCR Screening     Status: None   Collection Time: 05/31/17 12:26 PM  Result Value Ref Range Status   MRSA by PCR NEGATIVE NEGATIVE Final    Comment:        The GeneXpert MRSA Assay (FDA approved for NASAL specimens only), is one component of a comprehensive MRSA colonization surveillance program. It is not intended to diagnose MRSA infection nor to guide or monitor treatment for MRSA infections.      Studies: Dg Chest Portable 1 View  Result Date: 05/31/2017 CLINICAL DATA:  Chest pain this morning. History of sickle cell anemia. EXAM: PORTABLE CHEST 1 VIEW COMPARISON:  February 23, 2017 FINDINGS: Mild cardiomegaly. The hila and mediastinum are normal. No pulmonary nodules or masses. No focal infiltrates. No acute abnormalities. IMPRESSION: No active disease. Electronically Signed   By: Dorise Bullion III M.D   On: 05/31/2017 07:47    Scheduled Meds: . busPIRone  10 mg Oral BID  . citalopram  40 mg Oral Daily  . enoxaparin (LOVENOX) injection  40 mg Subcutaneous Q24H  . folic acid  1 mg Oral Daily  . HYDROmorphone   Intravenous Q4H  . hydroxyurea  1,000 mg Oral Daily  . ketorolac  30 mg Intravenous Q6H  . lidocaine (PF)  5 mL  Other Once  . OLANZapine  2.5 mg Oral Daily  . senna-docusate  1 tablet Oral BID   Continuous Infusions: . dextrose 5 % and 0.45% NaCl 125 mL/hr at 06/01/17 0206    Principal Problem:   Sickle cell pain crisis ([facility]) Active Problems:   Priapism   Sickle cell disease with crisis ([place])    In excess of 35 minutes spent during this visit. Greater than 50% involved face to face contact with the patient for assessment, counseling and coordination of care.

## 2017-06-02 DIAGNOSIS — R011 Cardiac murmur, unspecified: Secondary | ICD-10-CM

## 2017-06-02 DIAGNOSIS — N4830 Priapism, unspecified: Secondary | ICD-10-CM

## 2017-06-02 LAB — CBC WITH DIFFERENTIAL/PLATELET
BASOS ABS: 0 10*3/uL (ref 0.0–0.1)
BASOS PCT: 0 %
Band Neutrophils: 0 %
Blasts: 0 %
EOS PCT: 7 %
Eosinophils Absolute: 1 10*3/uL — ABNORMAL HIGH (ref 0.0–0.7)
HCT: 20 % — ABNORMAL LOW (ref 39.0–52.0)
Hemoglobin: 7.3 g/dL — ABNORMAL LOW (ref 13.0–17.0)
LYMPHS ABS: 4.1 10*3/uL — AB (ref 0.7–4.0)
Lymphocytes Relative: 30 %
MCH: 34.3 pg — ABNORMAL HIGH (ref 26.0–34.0)
MCHC: 36.5 g/dL — ABNORMAL HIGH (ref 30.0–36.0)
MCV: 93.9 fL (ref 78.0–100.0)
METAMYELOCYTES PCT: 0 %
MONO ABS: 1.1 10*3/uL — AB (ref 0.1–1.0)
MYELOCYTES: 0 %
Monocytes Relative: 8 %
Neutro Abs: 7.5 10*3/uL (ref 1.7–7.7)
Neutrophils Relative %: 55 %
Other: 0 %
PLATELETS: 303 10*3/uL (ref 150–400)
Promyelocytes Absolute: 0 %
RBC: 2.13 MIL/uL — AB (ref 4.22–5.81)
RDW: 22.1 % — ABNORMAL HIGH (ref 11.5–15.5)
WBC: 13.7 10*3/uL — AB (ref 4.0–10.5)
nRBC: 31 /100 WBC — ABNORMAL HIGH

## 2017-06-02 LAB — HIV ANTIBODY (ROUTINE TESTING W REFLEX): HIV SCREEN 4TH GENERATION: NONREACTIVE

## 2017-06-02 LAB — RETICULOCYTES
RBC.: 2.13 MIL/uL — ABNORMAL LOW (ref 4.22–5.81)
RETIC COUNT ABSOLUTE: 400.4 10*3/uL — AB (ref 19.0–186.0)
RETIC CT PCT: 18.8 % — AB (ref 0.4–3.1)

## 2017-06-02 MED ORDER — LIP MEDEX EX OINT
TOPICAL_OINTMENT | CUTANEOUS | Status: DC | PRN
  Filled 2017-06-02: qty 7

## 2017-06-02 NOTE — Progress Notes (Signed)
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 He was  Transfused 1 unit RBC's yesterday but still had an episode this morning which patient lasted about 40 minutes after receiving Sudafed. No priapism currently.  2. Hb SS with crisis: Continue PCA at  Dilaudid custom dose with bolus 0.5 mg, lockout 10 minutes and 1 hour limit 3 mg. Continue Toradol. COntinue Hydrea at 1500 mg alternating with 1000 mg which will get him to a therapeutic dosing. 3. Heart Murmur: Obtain 2-D ECHO  4. Anemia of Chronic Disease: Hb increased to 7.3 g/dL after transfusion od 1 unit RBC's. Will consider transfusion 1 additional unit RBC's if Priapism continues. Psychiatric History: Pt is on Zyprexa however he is unclear of the indication. No mention in available records.  5. OSA: Pt refused CPAP and states that he has uses Oxygen at night.    Code Status: Full Code Family Communication: [facility] present in room. Disposition Plan: Not yet ready for discharge  Garris Melhorn A.  Pager [phone]. If 7PM-7AM, please contact night-coverage.  06/02/2017, 2:23 PM  LOS: 2 days   Interim History: Pt reports that he had an episode of priapism this morning which lasted about 40 minutes. He also reports that he is waiting until pain increases in shoulder before using PCA. Instructed patient to use PCA as long as light is green for any pain so that pain can decrease over time. Pain also increases risk of priapism. He has used 20 mg of Dilaudid with 68/40:demands/deliveries in the last 24 hours.    Consultants:  None  Procedures:  None  Antibiotics:  None    Objective: Vitals:   06/02/17 0754 06/02/17 0756 06/02/17 1147 06/02/17 1257  BP: (!) 110/48  (!) 118/55   Pulse: 60  68   Resp: 14 17 10 12   Temp: 98.1 F (36.7 C)  98.2 F (36.8 C)   TempSrc: Oral  Oral   SpO2: 94% 96% 92% 96%  Weight:      Height:       Weight change:   Intake/Output Summary (Last 24 hours) at 06/02/17 1423 Last data filed at 06/02/17 1049  Gross per 24 hour  Intake          3077.58 ml  Output             1000 ml  Net          2077.58 ml      Physical Exam General: Alert, awake, oriented x3, in no apparent distress.  HEENT: [place]/AT PEERL, EOMI, anicteric Neck: Trachea midline,  no masses, no thyromegal,y no JVD, no carotid bruit OROPHARYNX:  Moist, No exudate/ erythema/lesions.  Heart: Regular rate and rhythm, II/VI non-radiating systolic ejectio murmurs at base. No rubs, gallops, PMI non-displaced, no heaves or thrills on palpation.  Lungs: Clear to auscultation, no wheezing or rhonchi noted. No increased vocal fremitus resonant to percussion  Abdomen: Soft, nontender, nondistended, positive bowel sounds, no masses no hepatosplenomegaly noted.  Neuro: No focal neurological deficits noted cranial nerves II through XII grossly intact.  Strength at baseline in bilateral upper and lower extremities.  Musculoskeletal: No warmth swelling or erythema around joints, no spinal tenderness noted. Psychiatric: Patient alert and oriented x3, good insight and cognition, good recent to remote recall. Genitalia: Penis flaccid.    Data Reviewed: Basic Metabolic Panel:  Recent Labs Lab 05/29/17 1606 05/31/17 0742 05/31/17 1236 06/01/17 0605  NA 139 140  --  141  K 4.4 3.8  --  4.3  CL 105 105  --  106  CO2 29 27  --  29  GLUCOSE 96 107*  --  103*  BUN 12 10  --  11  CREATININE 0.86 0.76 0.90 0.91  CALCIUM 9.2 9.4  --  8.5*   Liver Function Tests:  Recent Labs Lab 05/31/17 0742  06/01/17 0605  AST 33 34  ALT 31 32  ALKPHOS 74 62  BILITOT 5.2* 3.1*  PROT 7.3 6.6  ALBUMIN 4.2 3.5   No results for input(s): LIPASE, AMYLASE in the last 168 hours. No results for input(s): AMMONIA in the last 168 hours. CBC:  Recent Labs Lab 05/29/17 1606 05/31/17 0742 05/31/17 1236 06/01/17 0605 06/02/17 0625  WBC 13.5* 12.9* 12.7* 14.9* 13.7*  NEUTROABS 9.4* 7.5  --  8.7* 7.5  HGB 8.4* 7.9* 7.5* 6.9* 7.3*  HCT 22.9* 21.7* 21.2* 18.7* 20.0*  MCV 92.7 93.1 95.5 95.9 93.9  PLT 406* 394 365 307 303   Cardiac Enzymes: No results for input(s): CKTOTAL, CKMB, CKMBINDEX, TROPONINI in the last 168 hours. BNP (last 3 results) No results for input(s): BNP in the last 8760 hours.  ProBNP (last 3 results) No results for input(s): PROBNP in the last 8760 hours.  CBG: No results for input(s): GLUCAP in the last 168 hours.  Recent Results (from the past 240 hour(s))  MRSA PCR Screening     Status: None   Collection Time: 05/31/17 12:26 PM  Result Value Ref Range Status   MRSA by PCR NEGATIVE NEGATIVE Final    Comment:        The GeneXpert MRSA Assay (FDA approved for NASAL specimens only), is one component of a comprehensive MRSA colonization surveillance program. It is not intended to diagnose MRSA infection nor to guide or monitor treatment for MRSA infections.      Studies: Dg Chest Portable 1 View  Result Date: 05/31/2017 CLINICAL DATA:  Chest pain this morning. History of sickle cell anemia. EXAM: PORTABLE CHEST 1 VIEW COMPARISON:  February 23, 2017 FINDINGS: Mild cardiomegaly. The hila and mediastinum are normal. No pulmonary nodules or masses. No focal infiltrates. No acute abnormalities. IMPRESSION: No active disease. Electronically Signed   By: Dorise Bullion III M.D   On: 05/31/2017 07:47    Scheduled Meds: . busPIRone  10 mg Oral BID  . citalopram  40 mg Oral Daily  . enoxaparin (LOVENOX) injection  40 mg Subcutaneous Q24H  . folic acid  1 mg Oral Daily   . HYDROmorphone   Intravenous Q4H  . hydroxyurea  1,000 mg Oral QODAY   And  . [START ON 06/03/2017] hydroxyurea  1,500 mg Oral QODAY  . ketorolac  30 mg Intravenous Q6H  . lidocaine (PF)  5 mL Other Once  . OLANZapine  2.5 mg Oral Daily  . senna-docusate  1 tablet Oral BID   Continuous Infusions: . sodium chloride    . 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 Greater than 50% involved face to face contact with the patient for assessment, counseling and coordination of care.

## 2017-06-02 NOTE — Progress Notes (Signed)
Patient reported episode of priapism, PRN given.  Patient reassessed 20 minutes later.  Priapism resolved.

## 2017-06-03 ENCOUNTER — Inpatient Hospital Stay (HOSPITAL_COMMUNITY)

## 2017-06-03 DIAGNOSIS — I340 Nonrheumatic mitral (valve) insufficiency: Secondary | ICD-10-CM

## 2017-06-03 DIAGNOSIS — I351 Nonrheumatic aortic (valve) insufficiency: Secondary | ICD-10-CM

## 2017-06-03 LAB — ECHOCARDIOGRAM COMPLETE
HEIGHTINCHES: 69 in
WEIGHTICAEL: 2720 [oz_av]

## 2017-06-03 MED ORDER — OXYCODONE HCL 5 MG PO TABS
10.0000 mg | ORAL_TABLET | ORAL | Status: DC
  Administered 2017-06-03: 10 mg via ORAL
  Filled 2017-06-03: qty 2

## 2017-06-03 MED ORDER — OXYCODONE HCL 5 MG PO TABS
10.0000 mg | ORAL_TABLET | Freq: Four times a day (QID) | ORAL | Status: DC
  Administered 2017-06-04 – 2017-06-06 (×6): 10 mg via ORAL
  Filled 2017-06-03 (×8): qty 2

## 2017-06-03 NOTE — Progress Notes (Signed)
Pt c/o erection. Given po sudafed. Will continue to monitor at this time.

## 2017-06-03 NOTE — Progress Notes (Addendum)
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 He was  Transfused 1 unit RBC's yesterday but still had an episode this morning which patient lasted about 30 minutes however he did not notify nurse. No priapism currently. Electrophoresis still pending. Will speak with Urologist about considering Cialis.  2. Hb SS with crisis: Schedule Oxycodone 10 mg every 6 hours and Continue PCA for PRN use. Continue Toradol. Continue Hydrea at 1500 mg alternating with 1000 mg which will get him to a therapeutic dosing. 3. Heart Murmur: 2-D ECHO performed but Morrison report, PAP could not be estimated. Will speak to Cardiologist reading ECHO to see if able to obtain more inforrnation.  4. Anemia of Chronic Disease: Hb increased to 7.3 g/dL after transfusion of 1 unit RBC's. Hydrea dose increased to therapeutic range. Will recheck labs tomorrow. Psychiatric History: Pt is on Zyprexa however he is unclear of the indication. No mention in available records.  5. OSA: Pt refused CPAP and states that he has uses Oxygen at night.    Code Status: Full Code Family Communication: [facility] present in room. Disposition Plan: Not yet ready for discharge  Jeffrey Wahlberg A.  Pager [phone]. If 7PM-7AM, please contact night-coverage.  06/03/2017, 6:00 PM  LOS: 3 days   Interim History: Pt reports that he had an episode of priapism this morning which lasted about 30 minutes but he didn't report it to nurse. He states that it resolved spontaneously. Pt reports that he has been having frequent episodes of Priapism in the last 2  years since incarcerated. .Pt continue to have pain in right shoulder at intensity of 8/10.  He has used only 22.5 mg of Dilaudid with 49/45:demands/deliveries in the last 24 hours. Pain also increases risk of priapism.   Consultants:  None  Procedures:  None  Antibiotics:  None    Objective: Vitals:   06/03/17 1121 06/03/17 1214 06/03/17 1616 06/03/17 1650  BP: (!) 108/49   (!) 106/53  Pulse: 60   61  Resp: 12 14 12 13   Temp: 98.3 F (36.8 C)   98.6 F (37 C)  TempSrc: Oral   Oral  SpO2: 94% 92% 97% 98%  Weight:      Height:       Weight change:   Intake/Output Summary (Last 24 hours) at 06/03/17 1800 Last data filed at 06/03/17 1700  Gross Morrison 24 hour  Intake           773.67 ml  Output             1600 ml  Net          -826.33 ml      Physical Exam General: Alert, awake, oriented x3, in no apparent distress.  HEENT: [place]/AT PEERL, EOMI, anicteric  Heart: Regular rate and rhythm, II/VI non-radiating systolic ejectio murmurs at base. No rubs, gallops, PMI non-displaced, no heaves or thrills on palpation.  Lungs: Clear to auscultation, no wheezing or rhonchi noted. No increased vocal fremitus resonant to percussion  Abdomen: Soft, nontender, nondistended, positive bowel sounds, no masses no hepatosplenomegaly noted.  Neuro: No focal neurological  deficits noted cranial nerves II through XII grossly intact.  Strength at baseline in bilateral upper and lower extremities. Musculoskeletal: No warmth swelling or erythema around joints, no spinal tenderness noted. Psychiatric: Patient alert and oriented x3, good insight and cognition, good recent to remote recall. Genitalia: Penis flaccid.    Data Reviewed: Basic Metabolic Panel:  Recent Labs Lab 05/29/17 1606 05/31/17 0742 05/31/17 1236 06/01/17 0605  NA 139 140  --  141  K 4.4 3.8  --  4.3  CL 105 105  --  106  CO2 29 27  --  29  GLUCOSE 96 107*  --  103*  BUN 12 10  --  11  CREATININE 0.86 0.76 0.90  0.91  CALCIUM 9.2 9.4  --  8.5*   Liver Function Tests:  Recent Labs Lab 05/31/17 0742 06/01/17 0605  AST 33 34  ALT 31 32  ALKPHOS 74 62  BILITOT 5.2* 3.1*  PROT 7.3 6.6  ALBUMIN 4.2 3.5   No results for input(s): LIPASE, AMYLASE in the last 168 hours. No results for input(s): AMMONIA in the last 168 hours. CBC:  Recent Labs Lab 05/29/17 1606 05/31/17 0742 05/31/17 1236 06/01/17 0605 06/02/17 0625  WBC 13.5* 12.9* 12.7* 14.9* 13.7*  NEUTROABS 9.4* 7.5  --  8.7* 7.5  HGB 8.4* 7.9* 7.5* 6.9* 7.3*  HCT 22.9* 21.7* 21.2* 18.7* 20.0*  MCV 92.7 93.1 95.5 95.9 93.9  PLT 406* 394 365 307 303   Cardiac Enzymes: No results for input(s): CKTOTAL, CKMB, CKMBINDEX, TROPONINI in the last 168 hours. BNP (last 3 results) No results for input(s): BNP in the last 8760 hours.  ProBNP (last 3 results) No results for input(s): PROBNP in the last 8760 hours.  CBG: No results for input(s): GLUCAP in the last 168 hours.  Recent Results (from the past 240 hour(s))  MRSA PCR Screening     Status: None   Collection Time: 05/31/17 12:26 PM  Result Value Ref Range Status   MRSA by PCR NEGATIVE NEGATIVE Final    Comment:        The GeneXpert MRSA Assay (FDA approved for NASAL specimens only), is one component of a comprehensive MRSA colonization surveillance program. It is not intended to diagnose MRSA infection nor to guide or monitor treatment for MRSA infections.      Studies: Dg Chest Portable 1 View  Result Date: 05/31/2017 CLINICAL DATA:  Chest pain this morning. History of sickle cell anemia. EXAM: PORTABLE CHEST 1 VIEW COMPARISON:  February 23, 2017 FINDINGS: Mild cardiomegaly. The hila and mediastinum are normal. No pulmonary nodules or masses. No focal infiltrates. No acute abnormalities. IMPRESSION: No active disease. Electronically Signed   By: Dorise Bullion III M.D   On: 05/31/2017 07:47    Scheduled Meds: . busPIRone  10 mg Oral BID  . citalopram  40 mg Oral  Daily  . enoxaparin (LOVENOX) injection  40 mg Subcutaneous Q24H  . folic acid  1 mg Oral Daily  . HYDROmorphone   Intravenous Q4H  . hydroxyurea  1,000 mg Oral QODAY   And  . hydroxyurea  1,500 mg Oral QODAY  . ketorolac  30 mg Intravenous Q6H  . lidocaine (PF)  5 mL Other Once  . OLANZapine  2.5 mg Oral Daily  . oxyCODONE  10 mg Oral Q4H  . senna-docusate  1 tablet Oral BID   Continuous Infusions: . sodium chloride    . 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 Greater than 50% involved face to face contact with the patient for assessment, counseling and coordination of care.

## 2017-06-03 NOTE — Progress Notes (Signed)
  Echocardiogram 2D Echocardiogram has been performed.  Shanon Seawright L Androw 06/03/2017, 12:09 PM

## 2017-06-04 LAB — CBC WITH DIFFERENTIAL/PLATELET
BASOS PCT: 0 %
Basophils Absolute: 0 10*3/uL (ref 0.0–0.1)
Eosinophils Absolute: 0.9 10*3/uL — ABNORMAL HIGH (ref 0.0–0.7)
Eosinophils Relative: 8 %
HCT: 18.8 % — ABNORMAL LOW (ref 39.0–52.0)
HEMOGLOBIN: 6.7 g/dL — AB (ref 13.0–17.0)
LYMPHS PCT: 23 %
Lymphs Abs: 2.6 10*3/uL (ref 0.7–4.0)
MCH: 33.7 pg (ref 26.0–34.0)
MCHC: 35.6 g/dL (ref 30.0–36.0)
MCV: 94.5 fL (ref 78.0–100.0)
MONO ABS: 1.5 10*3/uL — AB (ref 0.1–1.0)
Monocytes Relative: 13 %
NEUTROS ABS: 6.5 10*3/uL (ref 1.7–7.7)
Neutrophils Relative %: 56 %
Platelets: 312 10*3/uL (ref 150–400)
RBC: 1.99 MIL/uL — ABNORMAL LOW (ref 4.22–5.81)
RDW: 21.4 % — ABNORMAL HIGH (ref 11.5–15.5)
WBC: 11.5 10*3/uL — ABNORMAL HIGH (ref 4.0–10.5)

## 2017-06-04 LAB — HEMOGLOBINOPATHY EVALUATION
HGB VARIANT: 0 %
Hgb A2 Quant: 4.3 % — ABNORMAL HIGH (ref 1.8–3.2)
Hgb A: 0 % — ABNORMAL LOW (ref 96.4–98.8)
Hgb C: 0 %
Hgb F Quant: 10 % — ABNORMAL HIGH (ref 0.0–2.0)
Hgb S Quant: 85.7 % — ABNORMAL HIGH

## 2017-06-04 LAB — BASIC METABOLIC PANEL
Anion gap: 7 (ref 5–15)
BUN: 14 mg/dL (ref 6–20)
CALCIUM: 8.7 mg/dL — AB (ref 8.9–10.3)
CO2: 29 mmol/L (ref 22–32)
Chloride: 107 mmol/L (ref 101–111)
Creatinine, Ser: 0.97 mg/dL (ref 0.61–1.24)
GFR calc non Af Amer: >60 mL/min (ref >60)
GLUCOSE: 106 mg/dL — AB (ref 65–99)
Potassium: 4.5 mmol/L (ref 3.5–5.1)
Sodium: 143 mmol/L (ref 135–145)

## 2017-06-04 LAB — RETICULOCYTES
RBC.: 1.99 MIL/uL — AB (ref 4.22–5.81)
RETIC CT PCT: 12.9 % — AB (ref 0.4–3.1)
Retic Count, Absolute: 256.7 10*3/uL — ABNORMAL HIGH (ref 19.0–186.0)

## 2017-06-04 LAB — PREPARE RBC (CROSSMATCH)

## 2017-06-04 MED ORDER — HYDROMORPHONE 1 MG/ML IV SOLN
INTRAVENOUS | Status: DC
  Administered 2017-06-04: 1.7 mg via INTRAVENOUS
  Administered 2017-06-04: 17:00:00 via INTRAVENOUS
  Administered 2017-06-05: 2 mg via INTRAVENOUS
  Administered 2017-06-05: 4 mg via INTRAVENOUS
  Administered 2017-06-05: 2.5 mg via INTRAVENOUS
  Administered 2017-06-06: 2 mg via INTRAVENOUS
  Administered 2017-06-06: 2.5 mg via INTRAVENOUS
  Filled 2017-06-04: qty 25

## 2017-06-04 MED ORDER — SODIUM CHLORIDE 0.9 % IV SOLN
Freq: Once | INTRAVENOUS | Status: AC
  Administered 2017-06-04: 10:00:00 via INTRAVENOUS

## 2017-06-04 NOTE — Care Management Note (Signed)
Case Management Note  Patient Details  Name: Jeffrey Morrison MRN: [id number] Date of Birth: 02/16/1996  Subjective/Objective:                  Sickle cell crisis Continues to have episodes of priaprism/hbg 6.6  Action/Plan: Date:  June 04, 2017 Chart reviewed for concurrent status and case management needs.  Will continue to follow patient progress.  Discharge Planning: following for needs  Expected discharge date: [id number]  Velva Harman, BSN, [place], [place]   Expected Discharge Date:                  Expected Discharge Plan:  Home/Self Care  In-House Referral:     Discharge planning Services  CM Consult  Post Acute Care Choice:    Choice offered to:     DME Arranged:    DME Agency:     HH Arranged:    HH Agency:     Status of Service:  In process, will continue to follow  If discussed at Long Length of Stay Meetings, dates discussed:    Additional Comments:  Jeffrey Cha, RN 06/04/2017, 9:49 AM

## 2017-06-04 NOTE — Progress Notes (Signed)
CRITICAL VALUE ALERT  Critical Value:  hgb 6.7  Date & Time Notied:  06/04/17 0720  Provider Notified: [facility] Paged [phone] 06/04/17 Will notify Dr Zigmund Daniel at 0800  Orders Received/Actions taken: awaiting orders

## 2017-06-04 NOTE — Progress Notes (Signed)
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 Stuttering Priapism: Pt continues to have intermittent priapism although less frequent. His Hb is lower today than yesterday so will transfuse 1 unit of RBC's today.  Electrophoresis still pending.  2. Hb SS with crisis: Continue scheduled Oxycodone 10 mg every 6 hours and Wean  PCA to every 15 minutes.  Continue Toradol. Continue Hydrea at 1500 mg alternating with 1000 mg which is a therapeutic dose. 3. Heart Murmur: 2-D ECHO performed but per report, PAP could not be estimated. Will speak to Cardiologist reading ECHO to see if able to obtain more inforrnation.  4. Anemia of Chronic Disease: Hb decreased to 6.7 g/dL after transfusion of 1 unit RBC's. Hydrea dose increased to therapeutic range. Will recheck labs after transfusion. 5. Psychiatric History: Pt is on Zyprexa however he is unclear of the indication. No mention in available records.  6. OSA: Pt refused CPAP and states that he has uses Oxygen at night. This is also a risk factor for priapism.    Code Status: Full Code Family Communication: [facility] present in room. Disposition Plan: Not yet ready for discharge  Jeffrey Morrison A.  Pager [phone]. If 7PM-7AM, please contact night-coverage.  06/04/2017, 2:17 PM  LOS: 4 days   Interim History: Pt reports that he had an episode of priapism this morning which lasted about 30 minutes but he didn't report it to nurse. He states that it resolved spontaneously. Pt continues to have pain in right shoulder at intensity of 6/10.  He has used only 17 mg of Dilaudid with 35/34:demands/deliveries in the last 24  hours.   Consultants:  None  Procedures:  None  Antibiotics:  None    Objective: Vitals:   06/04/17 0819 06/04/17 1006 06/04/17 1225 06/04/17 1400  BP:  114/63  (!) 110/57  Pulse:  61  61  Resp: 12 15 11 13   Temp:    98.1 F (36.7 C)  TempSrc:    Oral  SpO2: 95% 100% 99% 100%  Weight:      Height:       Weight change:   Intake/Output Summary (Last 24 hours) at 06/04/17 1417 Last data filed at 06/04/17 1403  Gross per 24 hour  Intake             1503 ml  Output             2000 ml  Net             -497 ml      Physical Exam General: Alert, awake, oriented x3, in no apparent distress.  HEENT: /AT PEERL, EOMI, anicteric  Heart: Regular rate and rhythm, II/VI non-radiating systolic ejectio murmurs at base. No rubs, gallops, PMI non-displaced, no heaves or thrills on palpation.  Lungs: Clear to auscultation, no wheezing or rhonchi noted. No increased vocal fremitus resonant to percussion  Abdomen: Soft, nontender, nondistended, positive bowel sounds, no masses no hepatosplenomegaly noted.  Neuro: No focal neurological deficits noted cranial nerves II through XII grossly intact.  Strength at baseline in bilateral upper and lower extremities. Musculoskeletal: No warmth swelling or erythema around joints, no spinal tenderness  noted. Psychiatric: Patient alert and oriented x3, good insight and cognition, good recent to remote recall. Genitalia: Penis flaccid.    Data Reviewed: Basic Metabolic Panel:  Recent Labs Lab 05/29/17 1606 05/31/17 0742 05/31/17 1236 06/01/17 0605 06/04/17 0643  NA 139 140  --  141 143  K 4.4 3.8  --  4.3 4.5  CL 105 105  --  106 107  CO2 29 27  --  29 29  GLUCOSE 96 107*  --  103* 106*  BUN 12 10  --  11 14  CREATININE 0.86 0.76 0.90 0.91 0.97  CALCIUM 9.2 9.4  --  8.5* 8.7*   Liver Function Tests:  Recent Labs Lab 05/31/17 0742 06/01/17 0605  AST 33 34  ALT 31 32  ALKPHOS 74 62  BILITOT 5.2* 3.1*  PROT 7.3 6.6   ALBUMIN 4.2 3.5   No results for input(s): LIPASE, AMYLASE in the last 168 hours. No results for input(s): AMMONIA in the last 168 hours. CBC:  Recent Labs Lab 05/29/17 1606 05/31/17 0742 05/31/17 1236 06/01/17 0605 06/02/17 0625 06/04/17 0643  WBC 13.5* 12.9* 12.7* 14.9* 13.7* 11.5*  NEUTROABS 9.4* 7.5  --  8.7* 7.5 6.5  HGB 8.4* 7.9* 7.5* 6.9* 7.3* 6.7*  HCT 22.9* 21.7* 21.2* 18.7* 20.0* 18.8*  MCV 92.7 93.1 95.5 95.9 93.9 94.5  PLT 406* 394 365 307 303 312   Cardiac Enzymes: No results for input(s): CKTOTAL, CKMB, CKMBINDEX, TROPONINI in the last 168 hours. BNP (last 3 results) No results for input(s): BNP in the last 8760 hours.  ProBNP (last 3 results) No results for input(s): PROBNP in the last 8760 hours.  CBG: No results for input(s): GLUCAP in the last 168 hours.  Recent Results (from the past 240 hour(s))  MRSA PCR Screening     Status: None   Collection Time: 05/31/17 12:26 PM  Result Value Ref Range Status   MRSA by PCR NEGATIVE NEGATIVE Final    Comment:        The GeneXpert MRSA Assay (FDA approved for NASAL specimens only), is one component of a comprehensive MRSA colonization surveillance program. It is not intended to diagnose MRSA infection nor to guide or monitor treatment for MRSA infections.      Studies: Dg Shoulder Right  Result Date: 06/03/2017 CLINICAL DATA:  Right shoulder pain EXAM: RIGHT SHOULDER - 2+ VIEW COMPARISON:  None. FINDINGS: There is no evidence of fracture or dislocation. There is no evidence of arthropathy or other focal bone abnormality. Soft tissues are unremarkable. IMPRESSION: Negative. Electronically Signed   By: Donavan Foil M.D.   On: 06/03/2017 21:58   Dg Chest Portable 1 View  Result Date: 05/31/2017 CLINICAL DATA:  Chest pain this morning. History of sickle cell anemia. EXAM: PORTABLE CHEST 1 VIEW COMPARISON:  February 23, 2017 FINDINGS: Mild cardiomegaly. The hila and mediastinum are normal. No pulmonary  nodules or masses. No focal infiltrates. No acute abnormalities. IMPRESSION: No active disease. Electronically Signed   By: Dorise Bullion III M.D   On: 05/31/2017 07:47    Scheduled Meds: . busPIRone  10 mg Oral BID  . citalopram  40 mg Oral Daily  . enoxaparin (LOVENOX) injection  40 mg Subcutaneous Q24H  . folic acid  1 mg Oral Daily  . HYDROmorphone   Intravenous Q4H  . hydroxyurea  1,000 mg Oral QODAY   And  . hydroxyurea  1,500 mg Oral QODAY  . ketorolac  30 mg Intravenous Q6H  .  lidocaine (PF)  5 mL Other Once  . OLANZapine  2.5 mg Oral Daily  . oxyCODONE  10 mg Oral Q6H  . senna-docusate  1 tablet Oral BID   Continuous Infusions: . sodium chloride    . 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 Greater than 50% involved face to face contact with the patient for assessment, counseling and coordination of care.

## 2017-06-04 NOTE — Progress Notes (Signed)
Pt complained of another erection. Paged pharmacy for PRN sudafed. Will continue to monitor at this time.

## 2017-06-04 NOTE — Care Management Important Message (Signed)
Important Message  Patient Details  Name: EVERSON MOTT MRN: [id number] Date of Birth: Jul 01, 1996   Medicare Important Message Given:  Yes    Kerin Salen 06/04/2017, 11:18 AMImportant Message  Patient Details  Name: RAMARI BRAY MRN: [id number] Date of Birth: 09-29-1995   Medicare Important Message Given:  Yes    Kerin Salen 06/04/2017, 11:18 AM

## 2017-06-05 LAB — TYPE AND SCREEN
ABO/RH(D): A POS
ANTIBODY SCREEN: NEGATIVE
UNIT DIVISION: 0
Unit division: 0

## 2017-06-05 LAB — BPAM RBC
BLOOD PRODUCT EXPIRATION DATE: 201810192359
Blood Product Expiration Date: 201810022359
ISSUE DATE / TIME: 201809172112
ISSUE DATE / TIME: 201809201822
Unit Type and Rh: 5100
Unit Type and Rh: 5100

## 2017-06-05 NOTE — Progress Notes (Signed)
Subjective: A 21 year old gentleman [facility] with sickle cell crisis and Priapsm. Patient's hemoglobin is at 6.7 and he has been doing better. His pain is at 7 out of 10 today mainly in his back. He denied any fever or chills denied any nausea vomiting or diarrhea. He is on oral oxycodone and the PCA is being weaned off. He has had 2-D echo that is normal.  Objective: Vital signs in last 24 hours: Temp:  [97.8 F (36.6 C)-98.3 F (36.8 C)] 98.3 F (36.8 C) (09/21 1353) Pulse Rate:  [51-63] 63 (09/21 1353) Resp:  [11-17] 13 (09/21 1710) BP: (113-144)/(47-65) 144/54 (09/21 1353) SpO2:  [96 %-100 %] 98 % (09/21 1710) Weight change:  Last BM Date: 05/29/17  Intake/Output from previous day: 09/20 0701 - 09/21 0700 In: 592.7 [P.O.:100; I.V.:492.7] Out: 800 [Urine:800] Intake/Output this shift: No intake/output data recorded.  General appearance: alert, cooperative, appears stated age and no distress Head: Normocephalic, without obvious abnormality, atraumatic Resp: clear to auscultation bilaterally Cardio: regular rate and rhythm, S1, S2 normal, no murmur, click, rub or gallop GI: soft, non-tender; bowel sounds normal; no masses,  no organomegaly Extremities: extremities normal, atraumatic, no cyanosis or edema Pulses: 2+ and symmetric Skin: Skin color, texture, turgor normal. No rashes or lesions Neurologic: Grossly normal  Lab Results:  Recent Labs  06/04/17 0643  WBC 11.5*  HGB 6.7*  HCT 18.8*  PLT 312   BMET  Recent Labs  06/04/17 0643  NA 143  K 4.5  CL 107  CO2 29  GLUCOSE 106*  BUN 14  CREATININE 0.97  CALCIUM 8.7*    Studies/Results: No results found.  Medications: I have reviewed the patient's current medications.  Assessment/Plan: #1 sickle cell painful crisis: Patient is still in crisis. I will keep him on current regimen but he will be transfused 1 unit of packed red blood cells today.  #2 Priapsm: this appears to be improving.  Patient is currently on oral medications but we will transfuse him 1 unit of packed red blood cells today.  #3 heart murmur: 2-D echo showed no obvious findings. Patient will have follow-up in the outpatient setting  #4 anemia of chronic disease: Hemoglobin is 6.7 however this is below his baseline. With ongoing symptoms we will transfuse 1 more unit of packed red blood cells   LOS: 5 days   GARBA,LAWAL 06/05/2017, 7:56 PM

## 2017-06-06 LAB — CBC WITH DIFFERENTIAL/PLATELET
BASOS PCT: 0 %
Band Neutrophils: 0 %
Basophils Absolute: 0 10*3/uL (ref 0.0–0.1)
Blasts: 0 %
Eosinophils Absolute: 1.3 10*3/uL — ABNORMAL HIGH (ref 0.0–0.7)
Eosinophils Relative: 11 %
HCT: 22.1 % — ABNORMAL LOW (ref 39.0–52.0)
Hemoglobin: 7.7 g/dL — ABNORMAL LOW (ref 13.0–17.0)
LYMPHS PCT: 29 %
Lymphs Abs: 3.3 10*3/uL (ref 0.7–4.0)
MCH: 31.8 pg (ref 26.0–34.0)
MCHC: 34.8 g/dL (ref 30.0–36.0)
MCV: 91.3 fL (ref 78.0–100.0)
MONO ABS: 0.9 10*3/uL (ref 0.1–1.0)
MONOS PCT: 8 %
Metamyelocytes Relative: 0 %
Myelocytes: 0 %
NEUTROS ABS: 5.9 10*3/uL (ref 1.7–7.7)
NEUTROS PCT: 52 %
NRBC: 11 /100{WBCs} — AB
OTHER: 0 %
PLATELETS: 354 10*3/uL (ref 150–400)
PROMYELOCYTES ABS: 0 %
RBC: 2.42 MIL/uL — ABNORMAL LOW (ref 4.22–5.81)
RDW: 21.2 % — AB (ref 11.5–15.5)
WBC: 11.4 10*3/uL — ABNORMAL HIGH (ref 4.0–10.5)

## 2017-06-06 MED ORDER — OXYCODONE HCL 10 MG PO TABS: 10.0000 mg | ORAL_TABLET | Freq: Four times a day (QID) | ORAL | 0 refills | Status: DC

## 2017-06-06 NOTE — Discharge Summary (Signed)
Physician Discharge Summary  Patient ID: Jeffrey Morrison MRN: [id number] DOB/AGE: 1996-04-10 21 y.o.  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 He has improved and appears to be doing much better. He has obstructive sleep apnea and has done better with CPAP. Patient also has no pain medications in the present. At discharge we have transition him to oral medications after IV medications here as well as oral oxycodone. Patient has done better at time of discharge. He was discharged back to present. His priapism has resolved.  Consults: urology  Significant Diagnostic Studies: labs: patient had multiple CBCs and CMP's checked. Reticulocyte was also checked.  Treatments: IV hydration and analgesia: Dilaudid  Discharge Exam: Blood pressure (!) 110/57, pulse 61, temperature 98.5 F (36.9 C), temperature source Oral, resp. rate 12, height 5\' 9"  (1.753 m), weight 77.1 kg (170 lb), SpO2 98 %. General appearance: alert, cooperative, appears stated age and no distress Back: symmetric, no curvature. ROM normal. No CVA tenderness. Resp: clear to auscultation bilaterally Cardio: regular rate and rhythm, S1, S2 normal, no murmur, click, rub or gallop GI: soft, non-tender; bowel sounds normal; no masses,  no organomegaly Extremities: extremities normal, atraumatic, no cyanosis or edema Pulses: 2+ and symmetric Skin: Skin color, texture, turgor normal. No rashes or lesions Neurologic: Grossly normal  Disposition: 01-Home or Self Care  Discharge Instructions    Diet - low  sodium heart healthy    Complete by:  As directed    Increase activity slowly    Complete by:  As directed      Allergies as of 06/06/2017   No Known Allergies     Medication List    TAKE these medications   busPIRone 10 MG tablet Commonly known as:  BUSPAR Take 10 mg by mouth 2 (two) times daily.   citalopram 40 MG tablet Commonly known as:  CELEXA Take 40 mg by mouth daily.   folic acid 1 MG tablet Commonly known as:  FOLVITE Take 1 tablet (1 mg total) by mouth daily.   hydroxyurea 500 MG capsule Commonly known as:  HYDREA Take 2 capsules (1,000 mg total) by mouth daily. May take with food to minimize GI side effects. What changed:  when to take this  additional instructions   OLANZapine 2.5 MG tablet Commonly known as:  ZYPREXA Take 2.5 mg by mouth daily.   Oxycodone HCl 10 MG Tabs Take 1 tablet (10 mg total) by mouth every 6 (six) hours.   pseudoephedrine 30 MG tablet Commonly known as:  SUDAFED Take 30 mg by mouth every 4 (four) hours as needed (for priapism).            Discharge Care Instructions        Start     Ordered   06/06/17 0000  oxyCODONE 10 MG TABS  Every 6 hours     06/06/17 0847   06/06/17 0000  Increase activity slowly     06/06/17 0930   06/06/17 0000  Diet - low sodium heart healthy     06/06/17 0930  SignedBarbette Merino 06/06/2017, 9:30 AM   Time spent 35 minutes

## 2017-06-09 ENCOUNTER — Emergency Department (HOSPITAL_COMMUNITY)
Admission: EM | Admit: 2017-06-09 | Discharge: 2017-06-09 | Disposition: A | Discharge status: Home / Self Care | Attending: Emergency Medicine | Admitting: Emergency Medicine

## 2017-06-09 ENCOUNTER — Encounter (HOSPITAL_COMMUNITY): Payer: Self-pay | Admitting: Emergency Medicine

## 2017-06-09 DIAGNOSIS — N4830 Priapism, unspecified: Secondary | ICD-10-CM | POA: Diagnosis not present

## 2017-06-09 DIAGNOSIS — N4832 Priapism due to disease classified elsewhere: Secondary | ICD-10-CM | POA: Diagnosis not present

## 2017-06-09 DIAGNOSIS — F17210 Nicotine dependence, cigarettes, uncomplicated: Secondary | ICD-10-CM | POA: Insufficient documentation

## 2017-06-09 DIAGNOSIS — D5700 Hb-SS disease with crisis, unspecified: Secondary | ICD-10-CM | POA: Diagnosis not present

## 2017-06-09 DIAGNOSIS — D57219 Sickle-cell/Hb-C disease with crisis, unspecified: Secondary | ICD-10-CM | POA: Diagnosis not present

## 2017-06-09 DIAGNOSIS — Z79899 Other long term (current) drug therapy: Secondary | ICD-10-CM | POA: Insufficient documentation

## 2017-06-09 LAB — URINALYSIS, ROUTINE W REFLEX MICROSCOPIC
Bilirubin Urine: NEGATIVE
Glucose, UA: NEGATIVE mg/dL
Hgb urine dipstick: NEGATIVE
KETONES UR: NEGATIVE mg/dL
LEUKOCYTES UA: NEGATIVE
NITRITE: NEGATIVE
PH: 6 (ref 5.0–8.0)
Protein, ur: NEGATIVE mg/dL
SPECIFIC GRAVITY, URINE: 1.008 (ref 1.005–1.030)

## 2017-06-09 LAB — COMPREHENSIVE METABOLIC PANEL
ALT: 57 U/L (ref 17–63)
ANION GAP: 8 (ref 5–15)
AST: 63 U/L — ABNORMAL HIGH (ref 15–41)
Albumin: 4.2 g/dL (ref 3.5–5.0)
Alkaline Phosphatase: 75 U/L (ref 38–126)
BUN: 15 mg/dL (ref 6–20)
CALCIUM: 9.1 mg/dL (ref 8.9–10.3)
CHLORIDE: 106 mmol/L (ref 101–111)
CO2: 25 mmol/L (ref 22–32)
Creatinine, Ser: 0.92 mg/dL (ref 0.61–1.24)
GFR calc non Af Amer: >60 mL/min (ref >60)
Glucose, Bld: 89 mg/dL (ref 65–99)
Potassium: 4.1 mmol/L (ref 3.5–5.1)
SODIUM: 139 mmol/L (ref 135–145)
Total Bilirubin: 2 mg/dL — ABNORMAL HIGH (ref 0.3–1.2)
Total Protein: 8 g/dL (ref 6.5–8.1)

## 2017-06-09 LAB — CBC WITH DIFFERENTIAL/PLATELET
Basophils Absolute: 0 10*3/uL (ref 0.0–0.1)
Basophils Relative: 0 %
EOS ABS: 0.1 10*3/uL (ref 0.0–0.7)
EOS PCT: 1 %
HCT: 23.5 % — ABNORMAL LOW (ref 39.0–52.0)
Hemoglobin: 8.1 g/dL — ABNORMAL LOW (ref 13.0–17.0)
LYMPHS ABS: 2.3 10*3/uL (ref 0.7–4.0)
Lymphocytes Relative: 22 %
MCH: 31.9 pg (ref 26.0–34.0)
MCHC: 34.5 g/dL (ref 30.0–36.0)
MCV: 92.5 fL (ref 78.0–100.0)
MONOS PCT: 9 %
Monocytes Absolute: 0.9 10*3/uL (ref 0.1–1.0)
Neutro Abs: 7 10*3/uL (ref 1.7–7.7)
Neutrophils Relative %: 68 %
PLATELETS: 478 10*3/uL — AB (ref 150–400)
RBC: 2.54 MIL/uL — ABNORMAL LOW (ref 4.22–5.81)
RDW: 20.7 % — AB (ref 11.5–15.5)
WBC: 10.2 10*3/uL (ref 4.0–10.5)

## 2017-06-09 LAB — RETICULOCYTES
RBC.: 2.54 MIL/uL — AB (ref 4.22–5.81)
RETIC COUNT ABSOLUTE: 218.4 10*3/uL — AB (ref 19.0–186.0)
Retic Ct Pct: 8.6 % — ABNORMAL HIGH (ref 0.4–3.1)

## 2017-06-09 MED ORDER — HYDROMORPHONE HCL 1 MG/ML IJ SOLN
2.0000 mg | INTRAMUSCULAR | Status: AC
  Administered 2017-06-09: 2 mg via INTRAVENOUS
  Filled 2017-06-09: qty 2

## 2017-06-09 MED ORDER — ONDANSETRON HCL 4 MG/2ML IJ SOLN: 4.0000 mg | INTRAMUSCULAR | Status: DC | PRN

## 2017-06-09 MED ORDER — PSEUDOEPHEDRINE HCL 60 MG PO TABS
30.0000 mg | ORAL_TABLET | Freq: Once | ORAL | Status: AC
  Administered 2017-06-09: 30 mg via ORAL
  Filled 2017-06-09: qty 1

## 2017-06-09 MED ORDER — DEXTROSE-NACL 5-0.45 % IV SOLN
INTRAVENOUS | Status: DC
  Administered 2017-06-09: 09:00:00 via INTRAVENOUS

## 2017-06-09 MED ORDER — DIPHENHYDRAMINE HCL 50 MG/ML IJ SOLN
25.0000 mg | Freq: Once | INTRAMUSCULAR | Status: AC
  Administered 2017-06-09: 25 mg via INTRAVENOUS
  Filled 2017-06-09: qty 1

## 2017-06-09 MED ORDER — KETOROLAC TROMETHAMINE 30 MG/ML IJ SOLN
30.0000 mg | INTRAMUSCULAR | Status: AC
  Administered 2017-06-09: 30 mg via INTRAVENOUS
  Filled 2017-06-09: qty 1

## 2017-06-09 MED ORDER — HYDROMORPHONE HCL 1 MG/ML IJ SOLN: 2.0000 mg | INTRAMUSCULAR | Status: AC

## 2017-06-09 MED ORDER — OXYCODONE-ACETAMINOPHEN 5-325 MG PO TABS
1.0000 | ORAL_TABLET | Freq: Once | ORAL | Status: AC
  Administered 2017-06-09: 1 via ORAL
  Filled 2017-06-09: qty 1

## 2017-06-09 NOTE — ED Triage Notes (Signed)
Pt [place] by [facility] department for complaints of Sickle cell crisis and priapism. Pt states that When he goes into crisis, he has pain in his legs and priapism. Pain is 7/10 in his legs and groin

## 2017-06-09 NOTE — ED Notes (Signed)
Bed: HT34 Expected date:  Expected time:  Means of arrival:  Comments: [employer]- priapism

## 2017-06-09 NOTE — Discharge Instructions (Signed)
As discussed, your evaluation today has been largely reassuring.  But, it is important that you monitor your condition carefully, and do not hesitate to return to the ED if you develop new, or concerning changes in your condition. ? ?Otherwise, please follow-up with your physician for appropriate ongoing care. ? ?

## 2017-06-09 NOTE — ED Provider Notes (Signed)
[facility] DEPT Provider Note   CSN: [id number] Arrival date & time: 06/09/17  0744     History   Chief Complaint Chief Complaint  Patient presents with  . Sickle Cell Pain Crisis  . Priapism    HPI Jeffrey Morrison is a 21 y.o. male.  HPI  Patient presents due to pain in his thighs bilaterally and priapism. Patient has a history of sickle cell disease, states that he was well until about 3 hours ago. Patient awoke with pain in bilateral thighs, and soon thereafter developed an erection. Patient notes that he is supposed to take narcotics, Sudafed, for pain relief and prophylaxis against priapism. He notes that he has neither of these medications in jail. Since onset no clear alleviating or exacerbating factors, and no other new complaints, patient states that the pain is typical for him during a sickle cell crisis episode. Last ED evaluation was within the past 2 weeks, and the patient has required injection at times for priapism relief.   Past Medical History:  Diagnosis Date  . Chronic lower back pain   . Sickle cell crisis [facility])     Patient Active Problem List   Diagnosis Date Noted  . Hb-SS disease with crisis ([place])   . 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 OSA (obstructive sleep apnea)   . Priapism 05/31/2017  . Sickle cell disease with crisis ([place]) 05/31/2017  . CAP (community acquired pneumonia) 07/05/2015  . Sickle cell crisis acute chest syndrome ([place]) 07/05/2015  . Pain of left lower leg 11/12/2014  . Cellulitis 11/12/2014  . Acute folliculitis 16/60/6301  . Tinea cruris 11/12/2014  . Acute chest syndrome in sickle crisis ([place]) 08/08/2014  . HCAP (healthcare-associated pneumonia) 08/08/2014  . Acute respiratory failure with hypoxia ([place]) 08/08/2014  . Hypoxia   . Anemia of chronic disease   . Sickle cell pain crisis ([place]) 01/08/2014  . Sickle cell crisis ([place]) 01/08/2014  . Leukocytosis 01/08/2014  . Anemia 01/08/2014    Past Surgical  History:  Procedure Laterality Date  . SPLENECTOMY         Home Medications    Prior to Admission medications   Medication Sig Start Date End Date Taking? Authorizing Provider  busPIRone (BUSPAR) 10 MG tablet Take 10 mg by mouth 2 (two) times daily.   Yes [provider]  citalopram (CELEXA) 40 MG tablet Take 40 mg by mouth daily.   Yes [provider]  folic acid (FOLVITE) 1 MG tablet Take 1 tablet (1 mg total) by mouth daily. 11/16/14  Yes Rai, Ripudeep K, MD  hydroxyurea (HYDREA) 500 MG capsule Take 2 capsules (1,000 mg total) by mouth daily. May take with food to minimize GI side effects. Patient taking differently: Take 1,000 mg by mouth 2 (two) times daily. May take with food to minimize GI side effects. 02/23/17  Yes Harris, Abigail, PA-C  pseudoephedrine (SUDAFED) 30 MG tablet Take 30 mg by mouth every 4 (four) hours as needed (for priapism).   Yes [provider]  oxyCODONE 10 MG TABS Take 1 tablet (10 mg total) by mouth every 6 (six) hours. Patient not taking: Reported on 06/09/2017 06/06/17   Elwyn Reach, MD    Family History History reviewed. No pertinent family history.  Social History Social History  Substance Use Topics  . Smoking status: Current Every Day Smoker    Packs/day: 0.50    Types: Cigarettes  . Smokeless tobacco: Never Used  . Alcohol use  No     Allergies   Patient has no known allergies.   Review of Systems Review of Systems  Constitutional:       Per HPI, otherwise negative  HENT:       Per HPI, otherwise negative  Respiratory:       Per HPI, otherwise negative  Cardiovascular:       Per HPI, otherwise negative  Gastrointestinal: Negative for vomiting.  Endocrine:       Negative aside from HPI  Genitourinary:       Neg aside from HPI   Musculoskeletal:       Per HPI, otherwise negative  Skin: Negative.   Allergic/Immunologic: Positive for immunocompromised state.  Neurological: Negative for syncope.    Hematological:       Per history of present illness     Physical Exam Updated Vital Signs Ht 5\' 9"  (1.753 m)   Wt 81.6 kg (180 lb)   BMI 26.58 kg/m   Physical Exam  Constitutional: He is oriented to person, place, and time. He appears well-developed. No distress.  HENT:  Head: Normocephalic and atraumatic.  Eyes: Conjunctivae and EOM are normal.  Cardiovascular: Normal rate and regular rhythm.   Pulmonary/Chest: Effort normal. No stridor. No respiratory distress.  Abdominal: He exhibits no distension.  Genitourinary:  Genitourinary Comments: Erection  Musculoskeletal: He exhibits no edema.  Neurological: He is alert and oriented to person, place, and time.  Skin: Skin is warm and dry.  Psychiatric: He has a normal mood and affect.  Nursing note and vitals reviewed.    ED Treatments / Results  Labs (all labs ordered are listed, but only abnormal results are displayed) Labs Reviewed  CBC WITH DIFFERENTIAL/PLATELET  COMPREHENSIVE METABOLIC PANEL  URINALYSIS, ROUTINE W REFLEX MICROSCOPIC  RETICULOCYTES    Procedures Procedures (including critical care time)  Medications Ordered in ED Medications  dextrose 5 %-0.45 % sodium chloride infusion (not administered)  ketorolac (TORADOL) 30 MG/ML injection 30 mg (not administered)  HYDROmorphone (DILAUDID) injection 2 mg (not administered)    Or  HYDROmorphone (DILAUDID) injection 2 mg (not administered)  HYDROmorphone (DILAUDID) injection 2 mg (not administered)    Or  HYDROmorphone (DILAUDID) injection 2 mg (not administered)  HYDROmorphone (DILAUDID) injection 2 mg (not administered)    Or  HYDROmorphone (DILAUDID) injection 2 mg (not administered)  ondansetron (ZOFRAN) injection 4 mg (not administered)     Initial Impression / Assessment and Plan / ED Course  I have reviewed the triage vital signs and the nursing notes.  Pertinent labs & imaging results that were available during my care of the patient were  reviewed by me and considered in my medical decision making (see chart for details).  Chart review notable for 4 prior ED visits in the past 6 months including one 2 weeks ago, with admission for sickle cell pain episode.   MDM male with history of sickle cell disease presents with sickle cell pain and priapism. Priapism resolved, pain improves, and without concerning findings, the patient was discharged in stable condition.  Final Clinical Impressions(s) / ED Diagnoses  Sickle cell pain crisis Priapism   Carmin Muskrat, MD 06/09/17 1137

## 2017-08-24 ENCOUNTER — Emergency Department (HOSPITAL_COMMUNITY): Payer: Medicare Other

## 2017-08-24 ENCOUNTER — Other Ambulatory Visit: Payer: Self-pay

## 2017-08-24 ENCOUNTER — Encounter (HOSPITAL_COMMUNITY): Payer: Self-pay | Admitting: Emergency Medicine

## 2017-08-24 ENCOUNTER — Inpatient Hospital Stay (HOSPITAL_COMMUNITY)
Admission: EM | Admit: 2017-08-24 | Discharge: 2017-08-27 | DRG: 866 | Disposition: A | Discharge status: Home / Self Care | Payer: Medicare Other | Attending: Internal Medicine | Admitting: Internal Medicine

## 2017-08-24 DIAGNOSIS — B259 Cytomegaloviral disease, unspecified: Secondary | ICD-10-CM | POA: Diagnosis not present

## 2017-08-24 DIAGNOSIS — D72820 Lymphocytosis (symptomatic): Secondary | ICD-10-CM | POA: Diagnosis not present

## 2017-08-24 DIAGNOSIS — E861 Hypovolemia: Secondary | ICD-10-CM | POA: Diagnosis not present

## 2017-08-24 DIAGNOSIS — F332 Major depressive disorder, recurrent severe without psychotic features: Secondary | ICD-10-CM | POA: Diagnosis not present

## 2017-08-24 DIAGNOSIS — R112 Nausea with vomiting, unspecified: Secondary | ICD-10-CM | POA: Diagnosis not present

## 2017-08-24 DIAGNOSIS — F329 Major depressive disorder, single episode, unspecified: Secondary | ICD-10-CM | POA: Diagnosis present

## 2017-08-24 DIAGNOSIS — I959 Hypotension, unspecified: Secondary | ICD-10-CM | POA: Diagnosis not present

## 2017-08-24 DIAGNOSIS — F32A Depression, unspecified: Secondary | ICD-10-CM

## 2017-08-24 DIAGNOSIS — Z9081 Acquired absence of spleen: Secondary | ICD-10-CM | POA: Diagnosis not present

## 2017-08-24 DIAGNOSIS — F17210 Nicotine dependence, cigarettes, uncomplicated: Secondary | ICD-10-CM | POA: Diagnosis present

## 2017-08-24 DIAGNOSIS — D638 Anemia in other chronic diseases classified elsewhere: Secondary | ICD-10-CM | POA: Diagnosis present

## 2017-08-24 DIAGNOSIS — Z818 Family history of other mental and behavioral disorders: Secondary | ICD-10-CM

## 2017-08-24 DIAGNOSIS — R55 Syncope and collapse: Secondary | ICD-10-CM | POA: Diagnosis not present

## 2017-08-24 DIAGNOSIS — R05 Cough: Secondary | ICD-10-CM | POA: Diagnosis not present

## 2017-08-24 DIAGNOSIS — R531 Weakness: Secondary | ICD-10-CM

## 2017-08-24 DIAGNOSIS — I9589 Other hypotension: Secondary | ICD-10-CM | POA: Diagnosis not present

## 2017-08-24 DIAGNOSIS — D571 Sickle-cell disease without crisis: Secondary | ICD-10-CM | POA: Diagnosis not present

## 2017-08-24 DIAGNOSIS — G4733 Obstructive sleep apnea (adult) (pediatric): Secondary | ICD-10-CM | POA: Diagnosis present

## 2017-08-24 DIAGNOSIS — R509 Fever, unspecified: Secondary | ICD-10-CM | POA: Diagnosis present

## 2017-08-24 DIAGNOSIS — F419 Anxiety disorder, unspecified: Secondary | ICD-10-CM | POA: Diagnosis present

## 2017-08-24 DIAGNOSIS — Z915 Personal history of self-harm: Secondary | ICD-10-CM

## 2017-08-24 DIAGNOSIS — N529 Male erectile dysfunction, unspecified: Secondary | ICD-10-CM | POA: Diagnosis not present

## 2017-08-24 DIAGNOSIS — D72829 Elevated white blood cell count, unspecified: Secondary | ICD-10-CM | POA: Diagnosis present

## 2017-08-24 DIAGNOSIS — R1110 Vomiting, unspecified: Secondary | ICD-10-CM | POA: Diagnosis not present

## 2017-08-24 DIAGNOSIS — M6281 Muscle weakness (generalized): Secondary | ICD-10-CM | POA: Diagnosis not present

## 2017-08-24 DIAGNOSIS — E860 Dehydration: Secondary | ICD-10-CM | POA: Diagnosis not present

## 2017-08-24 DIAGNOSIS — R1115 Cyclical vomiting syndrome unrelated to migraine: Secondary | ICD-10-CM | POA: Diagnosis present

## 2017-08-24 LAB — CBC WITH DIFFERENTIAL/PLATELET
Band Neutrophils: 0 %
Basophils Absolute: 0 10*3/uL (ref 0.0–0.1)
Basophils Relative: 0 %
Blasts: 0 %
Eosinophils Absolute: 0 10*3/uL (ref 0.0–0.7)
Eosinophils Relative: 0 %
HCT: 23.1 % — ABNORMAL LOW (ref 39.0–52.0)
Hemoglobin: 7.8 g/dL — ABNORMAL LOW (ref 13.0–17.0)
Lymphocytes Relative: 30 %
Lymphs Abs: 6.9 10*3/uL — ABNORMAL HIGH (ref 0.7–4.0)
MCH: 31.1 pg (ref 26.0–34.0)
MCHC: 33.8 g/dL (ref 30.0–36.0)
MCV: 92 fL (ref 78.0–100.0)
Metamyelocytes Relative: 3 %
Monocytes Absolute: 0.9 10*3/uL (ref 0.1–1.0)
Monocytes Relative: 4 %
Myelocytes: 2 %
Neutro Abs: 15.1 10*3/uL — ABNORMAL HIGH (ref 1.7–7.7)
Neutrophils Relative %: 61 %
Other: 0 %
Platelets: 520 10*3/uL — ABNORMAL HIGH (ref 150–400)
Promyelocytes Absolute: 0 %
RBC: 2.51 MIL/uL — ABNORMAL LOW (ref 4.22–5.81)
RDW: 21.4 % — ABNORMAL HIGH (ref 11.5–15.5)
WBC: 22.9 10*3/uL — ABNORMAL HIGH (ref 4.0–10.5)
nRBC: 8 /100 WBC — ABNORMAL HIGH

## 2017-08-24 LAB — COMPREHENSIVE METABOLIC PANEL
ALT: 136 U/L — ABNORMAL HIGH (ref 17–63)
AST: 95 U/L — ABNORMAL HIGH (ref 15–41)
Albumin: 3.6 g/dL (ref 3.5–5.0)
Alkaline Phosphatase: 96 U/L (ref 38–126)
Anion gap: 5 (ref 5–15)
BUN: 11 mg/dL (ref 6–20)
CO2: 27 mmol/L (ref 22–32)
Calcium: 8.8 mg/dL — ABNORMAL LOW (ref 8.9–10.3)
Chloride: 108 mmol/L (ref 101–111)
Creatinine, Ser: 1 mg/dL (ref 0.61–1.24)
GFR calc Af Amer: >60 mL/min (ref >60)
GFR calc non Af Amer: >60 mL/min (ref >60)
Glucose, Bld: 90 mg/dL (ref 65–99)
Potassium: 4 mmol/L (ref 3.5–5.1)
Sodium: 140 mmol/L (ref 135–145)
Total Bilirubin: 2.6 mg/dL — ABNORMAL HIGH (ref 0.3–1.2)
Total Protein: 7.4 g/dL (ref 6.5–8.1)

## 2017-08-24 LAB — URINALYSIS, ROUTINE W REFLEX MICROSCOPIC
Bacteria, UA: NONE SEEN
Bilirubin Urine: NEGATIVE
Glucose, UA: NEGATIVE mg/dL
Ketones, ur: NEGATIVE mg/dL
Leukocytes, UA: NEGATIVE
Nitrite: NEGATIVE
PROTEIN: 100 mg/dL — AB
Specific Gravity, Urine: 1.015 (ref 1.005–1.030)
Squamous Epithelial / LPF: NONE SEEN
pH: 5 (ref 5.0–8.0)

## 2017-08-24 LAB — RETICULOCYTES
RBC.: 2.51 MIL/uL — ABNORMAL LOW (ref 4.22–5.81)
Retic Count, Absolute: 338.9 10*3/uL — ABNORMAL HIGH (ref 19.0–186.0)
Retic Ct Pct: 13.5 % — ABNORMAL HIGH (ref 0.4–3.1)

## 2017-08-24 LAB — LIPASE, BLOOD: Lipase: 27 U/L (ref 11–51)

## 2017-08-24 LAB — INFLUENZA PANEL BY PCR (TYPE A & B)
Influenza A By PCR: NEGATIVE
Influenza B By PCR: NEGATIVE

## 2017-08-24 LAB — MRSA PCR SCREENING: MRSA by PCR: NEGATIVE

## 2017-08-24 MED ORDER — DEXTROSE-NACL 5-0.45 % IV SOLN
INTRAVENOUS | Status: DC
  Administered 2017-08-24 – 2017-08-25 (×2): via INTRAVENOUS

## 2017-08-24 MED ORDER — ONDANSETRON HCL 4 MG/2ML IJ SOLN
4.0000 mg | Freq: Once | INTRAMUSCULAR | Status: AC
  Administered 2017-08-24: 4 mg via INTRAVENOUS
  Filled 2017-08-24: qty 2

## 2017-08-24 MED ORDER — ACETAMINOPHEN 325 MG PO TABS
650.0000 mg | ORAL_TABLET | Freq: Once | ORAL | Status: AC
  Administered 2017-08-24: 650 mg via ORAL

## 2017-08-24 MED ORDER — SODIUM CHLORIDE 0.9 % IV BOLUS (SEPSIS)
1000.0000 mL | Freq: Once | INTRAVENOUS | Status: AC
  Administered 2017-08-24: 1000 mL via INTRAVENOUS

## 2017-08-24 MED ORDER — DEXTROSE 5 % IV SOLN
1.0000 g | Freq: Once | INTRAVENOUS | Status: DC
  Filled 2017-08-24: qty 10

## 2017-08-24 MED ORDER — DEXTROSE 5 % IV SOLN
500.0000 mg | Freq: Once | INTRAVENOUS | Status: DC
  Filled 2017-08-24: qty 500

## 2017-08-24 MED ORDER — ACETAMINOPHEN 325 MG PO TABS
ORAL_TABLET | ORAL | Status: AC
  Administered 2017-08-24: 650 mg via ORAL
  Filled 2017-08-24: qty 2

## 2017-08-24 MED ORDER — ENOXAPARIN SODIUM 40 MG/0.4ML ~~LOC~~ SOLN
40.0000 mg | SUBCUTANEOUS | Status: DC
  Administered 2017-08-24 – 2017-08-25 (×2): 40 mg via SUBCUTANEOUS
  Filled 2017-08-24 (×3): qty 0.4

## 2017-08-24 MED ORDER — SODIUM CHLORIDE 0.9 % IV BOLUS (SEPSIS)
1000.0000 mL | Freq: Once | INTRAVENOUS | Status: DC
  Administered 2017-08-24: 1000 mL via INTRAVENOUS

## 2017-08-24 NOTE — ED Provider Notes (Addendum)
Medical screening examination/treatment/procedure(s) were conducted as a shared visit with non-physician practitioner(s) and myself.  I personally evaluated the patient during the encounter.  Patient presented to the emergency with colitis of generalized nausea and headache.  He recently has been using crack cocaine but denies any IV drug use.  Patient's laboratory tests are notable for a significant leukocytosis.  Patient has been coughing and has had congestion.  Chest x-ray does not show pneumonia.  On exam the patient appears very fatigued still.  His blood pressure is borderline low and he has had episodes of tachypnea.  Concerned about the possibility of a pneumonia not evident at this time.  Considering his sickle cell disease and immunocompromise state we will consult with the medical service regarding possible admission  EKG Normal sinus rhythm rate 87 Normal axis, normal intervals Normal EKG No prior EKG for comparison       Dorie Rank, MD 08/24/17 1541

## 2017-08-24 NOTE — ED Notes (Signed)
Assigned 1517 @ 16:29 call report @ 16:49

## 2017-08-24 NOTE — ED Triage Notes (Signed)
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 Reports last use 12/2.  BP 100/56 HR 80 O2 100% CBG 97

## 2017-08-24 NOTE — ED Provider Notes (Signed)
[facility] [facility] Provider Note   CSN: [id number] Arrival date & time: 08/24/17  1147     History   Chief Complaint Chief Complaint  Patient presents with  . Weakness    HPI Jeffrey Morrison is a 21 y.o. 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  He reports using marijuana daily, most recently yesterday.  He denies using any other drugs or alcohol recently.  He does use alcohol socially. He has no history of IVDU. He denies any chest pain, but has had some shortness of breath.  Patient reports he does not usually have sickle cell crises and this does not feel like typical.  Patient reports he began having the nausea and vomiting this morning.  He has had associated left upper quadrant pain that is worse with vomiting.  His headache is improved from earlier.  He denies taking any medications prior to arrival.  HPI  Past Medical History:  Diagnosis Date  . Chronic lower back pain   . Sickle cell crisis [facility])     Patient Active Problem List   Diagnosis Date Noted  . Febrile illness, acute 08/24/2017  . Syncope and collapse 08/24/2017  . Hb-SS disease with crisis ([place])   . 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 OSA (obstructive sleep apnea)   . Priapism 05/31/2017  . Sickle cell disease with crisis ([place]) 05/31/2017  . CAP (community acquired pneumonia) 07/05/2015  . Sickle cell crisis acute chest syndrome ([place]) 07/05/2015  . Pain of left lower leg 11/12/2014  . Cellulitis 11/12/2014  . Acute folliculitis 81/19/1478  . Tinea cruris 11/12/2014  . Acute chest syndrome in sickle crisis ([place]) 08/08/2014  . HCAP (healthcare-associated pneumonia) 08/08/2014  . Acute respiratory failure with hypoxia ([place])  08/08/2014  . Hypoxia   . Anemia of chronic disease   . Sickle cell pain crisis ([place]) 01/08/2014  . Sickle cell crisis ([place]) 01/08/2014  . Leukocytosis 01/08/2014  . Anemia 01/08/2014    Past Surgical History:  Procedure Laterality Date  . SPLENECTOMY         Home Medications    Prior to Admission medications   Medication Sig Start Date End Date Taking? Authorizing Provider  busPIRone (BUSPAR) 10 MG tablet Take 10 mg by mouth 2 (two) times daily.    [provider]  citalopram (CELEXA) 40 MG tablet Take 40 mg by mouth daily.    [provider]  folic acid (FOLVITE) 1 MG tablet Take 1 tablet (1 mg total) by mouth daily. 11/16/14   Rai, Vernelle Emerald, MD  hydroxyurea (HYDREA) 500 MG capsule Take 2 capsules (1,000 mg total) by mouth daily. May take with food to minimize GI side effects. Patient taking differently: Take 1,000 mg by mouth 2 (two) times daily. May take with food to minimize GI side effects. 02/23/17   Harris, Vernie Shanks, PA-C  oxyCODONE 10 MG TABS Take 1 tablet (10 mg total) by mouth every 6 (six) hours. Patient not taking: Reported on 06/09/2017 06/06/17   Elwyn Reach, MD    Family History No family history on file.  Social History Social History   Tobacco Use  . Smoking status: Current Every Day Smoker    Packs/day: 0.50  Types: Cigarettes  . Smokeless tobacco: Never Used  Substance Use Topics  . Alcohol use: No  . Drug use: No     Allergies   Patient has no known allergies.   Review of Systems Review of Systems  Constitutional: Negative for chills and fever.  HENT: Negative for facial swelling and sore throat.   Respiratory: Positive for cough and shortness of breath.   Cardiovascular: Negative for chest pain.  Gastrointestinal: Positive for abdominal pain (LUQ), nausea and vomiting.  Genitourinary: Negative for dysuria.  Musculoskeletal: Negative for back pain.  Skin: Negative for rash and wound.  Neurological: Positive for  weakness and headaches.  Psychiatric/Behavioral: The patient is not nervous/anxious.      Physical Exam Updated Vital Signs BP (!) 101/52   Pulse 83   Temp 99.5 F (37.5 C) (Oral)   Resp 16   SpO2 92%   Physical Exam  Constitutional: He appears well-developed and well-nourished. No distress.  HENT:  Head: Normocephalic and atraumatic.  Mouth/Throat: Oropharynx is clear and moist. No oropharyngeal exudate.  Eyes: Conjunctivae are normal. Pupils are equal, round, and reactive to light. Right eye exhibits no discharge. Left eye exhibits no discharge. No scleral icterus.  Neck: Normal range of motion. Neck supple. No thyromegaly present.  Cardiovascular: Normal rate, regular rhythm, normal heart sounds and intact distal pulses. Exam reveals no gallop and no friction rub.  No murmur heard. Pulmonary/Chest: Effort normal and breath sounds normal. No stridor. No respiratory distress. He has no wheezes. He has no rales.  Abdominal: Soft. Bowel sounds are normal. He exhibits no distension. There is tenderness in the left upper quadrant. There is no rebound and no guarding.  Musculoskeletal: He exhibits no edema.  Lymphadenopathy:    He has no cervical adenopathy.  Neurological: He is alert. Coordination normal.  Skin: Skin is warm and dry. No rash noted. He is not diaphoretic. No pallor.  Psychiatric: He has a normal mood and affect.  Nursing note and vitals reviewed.    ED Treatments / Results  Labs (all labs ordered are listed, but only abnormal results are displayed) Labs Reviewed  COMPREHENSIVE METABOLIC PANEL - Abnormal; Notable for the following components:      Result Value   Calcium 8.8 (*)    AST 95 (*)    ALT 136 (*)    Total Bilirubin 2.6 (*)    All other components within normal limits  CBC WITH DIFFERENTIAL/PLATELET - Abnormal; Notable for the following components:   WBC 22.9 (*)    RBC 2.51 (*)    Hemoglobin 7.8 (*)    HCT 23.1 (*)    RDW 21.4 (*)    Platelets  520 (*)    nRBC 8 (*)    Neutro Abs 15.1 (*)    Lymphs Abs 6.9 (*)    All other components within normal limits  RETICULOCYTES - Abnormal; Notable for the following components:   Retic Ct Pct 13.5 (*)    RBC. 2.51 (*)    Retic Count, Absolute 338.9 (*)    All other components within normal limits  CULTURE, BLOOD (ROUTINE X 2)  CULTURE, BLOOD (ROUTINE X 2)  LIPASE, BLOOD  INFLUENZA PANEL BY PCR (TYPE A & B)  URINALYSIS, ROUTINE W REFLEX MICROSCOPIC    EKG  EKG Interpretation None       Radiology Dg Chest 2 View  Result Date: 08/24/2017 CLINICAL DATA:  Weakness with nausea and headache. Cough. Sickle cell disease. EXAM: CHEST  2  VIEW COMPARISON:  05/31/2017 FINDINGS: Airway thickening is present, suggesting bronchitis or reactive airways disease. The lungs appear otherwise clear. Cardiac and mediastinal margins appear normal.  No pleural effusion. IMPRESSION: 1. Mild airway thickening may reflect bronchitis or reactive airways disease. Otherwise, no significant abnormalities are observed. Electronically Signed   By: Van Clines M.D.   On: 08/24/2017 12:43    Procedures Procedures (including critical care time)  Medications Ordered in ED Medications  sodium chloride 0.9 % bolus 1,000 mL (1,000 mLs Intravenous New Bag/Given 08/24/17 1733)  sodium chloride 0.9 % bolus 1,000 mL (0 mLs Intravenous Stopped 08/24/17 1431)  ondansetron (ZOFRAN) injection 4 mg (4 mg Intravenous Given 08/24/17 1403)  acetaminophen (TYLENOL) tablet 650 mg (650 mg Oral Given 08/24/17 1433)  sodium chloride 0.9 % bolus 1,000 mL (0 mLs Intravenous Stopped 08/24/17 1728)     Initial Impression / Assessment and Plan / ED Course  I have reviewed the triage vital signs and the nursing notes.  Pertinent labs & imaging results that were available during my care of the patient were reviewed by me and considered in my medical decision making (see chart for details).     Patient with history of  sickle cell anemia presenting with a one week history of weakness, intermittent cough, and 1 day history of nausea or vomiting.  Patient appears to have significant leukocytosis, ~23,000.  Patient developed fever, 100.6, in the ED, given Tylenol. Patient has mild elevation of LFTs, could be related to vomiting.  Chest x-ray shows mild bronchitic changes.  Patient appearing dehydrated.  Patient initially having some left upper quadrant tenderness, however this resolved after Zofran. No abdomen is soft and nontender. Influenza A and B.  Blood cultures pending.  Initially planned to initiate antibiotics empirically for suspected clinical pneumonia, however will defer to admitting team.  I consulted Dr. Zigmund Daniel with the sickle cell service who will admit the patient for further evaluation and treatment. Patient also evaluated by Dr. Tomi Bamberger who guided the patient's management and agrees with plan.  Final Clinical Impressions(s) / ED Diagnoses   Final diagnoses:  Generalized weakness  Leukocytosis, unspecified type  Non-intractable vomiting with nausea, unspecified vomiting type    ED Discharge Orders    None       Frederica Kuster, PA-C 08/24/17 1754    Dorie Rank, MD 08/25/17 [phone]

## 2017-08-24 NOTE — H&P (Signed)
Hospital Admission Note Date: 08/24/2017  Patient name: Jeffrey Morrison Medical record number: [id number] Date of birth: 1996/02/17 Age: 21 y.o. Gender: male PCP: Patient, No Pcp Per  Attending physician: [name], MD  Chief Complaint:Syncopal episodes x 3 today  History of Present Illness: [name] is a patient with Hb SS who presented to the ED today after having 3 episodes of syncope today. According to the family with friend with whom the patient lives, he had several episodes of emesis within the last day. However this morning they heard a thump upstairs and went up to find the "passed out". He regained a conscious state after a few seconds after they heard the sound.This apparently happened 2 more times and EMS was called. Patient is unable to account any details of the events this morning. He knows that he passed out but was unable to give any details regarding dizziness or palpitations preeceeding the episodes of "passing out". He reports more than 12 episodes of emesis which were non-bilious or without blood. Pt had been using crack cocaine until 1 week ago when he went to [facility] for "detox". However after a few hours he was discharged as the hospital was unable to provide "detox' for crack cocaine., Alternatively, he stopped using crack cocaine "cold [place]" in since last Monday (1 week ago today) and states that her has been feeling "like crap" since then. He has been smoking Marijuana since then.  Pt denies any current pain. He was incarcerated until 1 month ago and since then has not had any of his previously prescribed medications. He was previously taking Hydrea, Folic Acid, Buspar and Celexa all of which were abruptly discontinued at the time of release from prison. He does not currently have a primary provider. He denies any fevers, but does admit to chills and a non-productive cough in the last several days. Pt also admits to decreased urine output and describes urine as dark. He  also c/o feeling thirsty and is requesting something to drink. He has had no further emesis since arrival to the ED.   In the ED he has a CXR which was negative for any acute findings. He was   noted to have low BP and has received 2 L of 0.9 NS. He was afebrile on arrival  But has since developed a temperature of 100.6. Antibiotics were ordered in suspicion of a possible pneumonia that has not yet shown on CXR however I held the antibiotics and the patient did not receive them as he has no compelling history or clinical symptoms of a pneumonia. I am admitting the patient for dehydration and syncopal episodes.   Scheduled Meds: Continuous Infusions: . sodium chloride     PRN Meds:. Allergies: Patient has no known allergies. Past Medical History:  Diagnosis Date  . Chronic lower back pain   . Sickle cell crisis [facility])    Past Surgical History:  Procedure Laterality Date  . SPLENECTOMY     No family history on file. Social History   Socioeconomic History  . Marital status: Single    Spouse name: Not on file  . Number of children: Not on file  . Years of education: Not on file  . Highest education level: Not on file  Social Needs  . Financial resource strain: Not on file  . Food insecurity - worry: Not on file  . Food insecurity - inability: Not on file  . Transportation needs - medical: Not on file  . Transportation needs -  non-medical: Not on file  Occupational History  . Not on file  Tobacco Use  . Smoking status: Current Every Day Smoker    Packs/day: 0.50    Types: Cigarettes  . Smokeless tobacco: Never Used  Substance and Sexual Activity  . Alcohol use: No  . Drug use: No  . Sexual activity: Not on file  Other Topics Concern  . Not on file  Social History Narrative  . Not on file   Review of Systems: Pertinent items noted in HPI and remainder of comprehensive ROS otherwise negative. Physical Exam: No intake or output data in the 24 hours ending 08/24/17  1704 General: Alert, awake, oriented x3, in no apparent distress. Appears generally ill but non-toxic. HEENT: [place]/AT PEERL, EOMI Neck: Trachea midline,  no masses, no thyromegal,y no JVD, no carotid bruit OROPHARYNX:  Moist, No exudate/ erythema/lesions.  Heart: Regular rhythm but tachycardia present. There are no murmurs, rubs or gallops, PMI non-displaced, no heaves or thrills on palpation.  Lungs: Clear to auscultation, no wheezing or rhonchi noted. No increased vocal fremitus resonant to percussion  Abdomen: Soft, nontender, nondistended, positive bowel sounds, no masses no hepatosplenomegaly noted..  Neuro: No focal neurological deficits noted cranial nerves II through XII grossly intact. Strength at baseline in bilateral upper and lower extremities. Musculoskeletal: No warmth swelling or erythema around joints, no spinal tenderness noted. Psychiatric: Patient alert and oriented x3, good insight and cognition, good recent to remote recall. Lymph node survey: No cervical axillary or inguinal lymphadenopathy noted.  Lab results: Recent Labs    08/24/17 1300  NA 140  K 4.0  CL 108  CO2 27  GLUCOSE 90  BUN 11  CREATININE 1.00  CALCIUM 8.8*   Recent Labs    08/24/17 1300  AST 95*  ALT 136*  ALKPHOS 96  BILITOT 2.6*  PROT 7.4  ALBUMIN 3.6   Recent Labs    08/24/17 1300  LIPASE 27   Recent Labs    08/24/17 1300  WBC 22.9*  NEUTROABS 15.1*  HGB 7.8*  HCT 23.1*  MCV 92.0  PLT 520*   No results for input(s): CKTOTAL, CKMB, CKMBINDEX, TROPONINI in the last 72 hours. Invalid input(s): POCBNP No results for input(s): DDIMER in the last 72 hours. No results for input(s): HGBA1C in the last 72 hours. No results for input(s): CHOL, HDL, LDLCALC, TRIG, CHOLHDL, LDLDIRECT in the last 72 hours. No results for input(s): TSH, T4TOTAL, T3FREE, THYROIDAB in the last 72 hours.  Invalid input(s): FREET3 Recent Labs    08/24/17 1300  RETICCTPCT 13.5*   Imaging results:  Dg  Chest 2 View  Result Date: 08/24/2017 CLINICAL DATA:  Weakness with nausea and headache. Cough. Sickle cell disease. EXAM: CHEST  2 VIEW COMPARISON:  05/31/2017 FINDINGS: Airway thickening is present, suggesting bronchitis or reactive airways disease. The lungs appear otherwise clear. Cardiac and mediastinal margins appear normal.  No pleural effusion. IMPRESSION: 1. Mild airway thickening may reflect bronchitis or reactive airways disease. Otherwise, no significant abnormalities are observed. Electronically Signed   By: Van Clines M.D.   On: 08/24/2017 12:43   Other results:  Assessment and Plan: 1. Syncope:  It is possible that the patient was orthostatic from a dehydrated state. Unfortunately he received 2L of NS before orthostatic vital signs were considered. However in light of his concurrent diagnosis of Hb SS he is predisposed to intracerebral events and cardiac architectural cahnges that could lead to syncopal episodes so I will obtain CT head and  12 lead EKG. Will also consider ECHO depending on EKG findings. Place on telemetry. 2. Leukocytosis: Pt has no left shift. BC have been taken in the ED. I suspect this is a result of dehydrated and hemo-concentrated state. Will re-assess after hydration. 3. Hypotension: Secondary to dehydrated state. Will re-assess after 24 hours of volume repletion. If still low will obtain ECHO. 4. Dehydration: Continue IVF fluid replacement and monitor intake and output.  5. Febrile Illness:  Pt has a mild increase in temperature and generalized malaise. While I do not fell that he has a pneumonia, he is at risk for influenza. A Flu PCR has been ordered in the ED. PT should be on Droplet precautions until results of flu PCR resulted. Symptoms started more than 3 days ago so Tamiflu not indicated.   6. Hb SS without Crisis: Will not resume Hydrea s currently he is not followed by any Provider and Hydrea requires close monitoring.  7. Recent Cocaine Use and  Self Detox: Will consult SW for out patient rehab.  8. Depression and Anxiety: Will ask Psychiatry to see in consult for evaluation of medications. Pt is not sure of underlying diagnosis that resulted in him taking these medications.  In excess of 76 minutes spent during this encounter. Greater than 50% of time spent in face to face contact, counseling and coordination of care.  MATTHEWS,MICHELLE A. 08/24/2017, 5:04 PM    MATTHEWS,MICHELLE A.  Pager [phone]. If 7PM-7AM, please contact night-coverage.

## 2017-08-24 NOTE — ED Notes (Signed)
Bed: [facility]PT3 Expected date:  Expected time:  Means of arrival:  Comments: 

## 2017-08-24 NOTE — ED Notes (Signed)
ED TO INPATIENT HANDOFF REPORT  Name/Age/Gender Jeffrey Morrison 21 y.o. male  Code Status Code Status History    Date Active Date Inactive Code Status Order ID Comments User Context   05/31/2017 12:24 06/06/2017 14:34 Full Code [id number]  Elwyn Reach, MD Inpatient   07/05/2015 23:42 07/08/2015 19:58 Full Code [id number]  Allyne Gee, MD Inpatient   11/12/2014 16:14 11/16/2014 19:00 Full Code [id number]  Karen Kitchens Inpatient   09/10/2014 21:57 09/12/2014 23:13 Full Code [id number]  Theressa Millard, MD Inpatient   08/08/2014 03:34 08/14/2014 19:10 Full Code [id number]  Shanda Howells, MD ED   08/08/2014 03:29 08/08/2014 03:34 Full Code [id number]  Shanda Howells, MD ED   01/08/2014 07:46 01/12/2014 17:59 Full Code [id number]  Berle Mull, MD Inpatient      Home/SNF/Other Home  Chief Complaint detox  Level of Care/Admitting Diagnosis ED Disposition    ED Disposition Condition [place]: [facility] [[id number]]  Level of Care: Med-Surg [16]  Diagnosis: Febrile illness, acute [716967]  Admitting Physician: [name] [[id number]]  Attending Physician: Liston Alba A [[id number]]  Estimated length of stay: past midnight tomorrow  Certification:: I certify this patient will need inpatient services for at least 2 midnights  PT Class (Do Not Modify): Inpatient [101]  PT Acc Code (Do Not Modify): Private [1]       Medical History Past Medical History:  Diagnosis Date  . Chronic lower back pain   . Sickle cell crisis ([place])     Allergies No Known Allergies  IV Location/Drains/Wounds Patient Lines/Drains/Airways Status   Active Line/Drains/Airways    None          Labs/Imaging Results for orders placed or performed during the hospital encounter of 08/24/17 (from the past 48 hour(s))  Comprehensive metabolic panel     Status: Abnormal   Collection Time: 08/24/17  1:00 PM  Result Value Ref Range   Sodium 140 135  - 145 mmol/L   Potassium 4.0 3.5 - 5.1 mmol/L   Chloride 108 101 - 111 mmol/L   CO2 27 22 - 32 mmol/L   Glucose, Bld 90 65 - 99 mg/dL   BUN 11 6 - 20 mg/dL   Creatinine, Ser 1.00 0.61 - 1.24 mg/dL   Calcium 8.8 (L) 8.9 - 10.3 mg/dL   Total Protein 7.4 6.5 - 8.1 g/dL   Albumin 3.6 3.5 - 5.0 g/dL   AST 95 (H) 15 - 41 U/L   ALT 136 (H) 17 - 63 U/L   Alkaline Phosphatase 96 38 - 126 U/L   Total Bilirubin 2.6 (H) 0.3 - 1.2 mg/dL   GFR calc non Af Amer >60 >60 mL/min   GFR calc Af Amer >60 >60 mL/min    Comment: (NOTE) The eGFR has been calculated using the CKD EPI equation. This calculation has not been validated in all clinical situations. eGFR's persistently <60 mL/min signify possible Chronic Kidney Disease.    Anion gap 5 5 - 15  Lipase, blood     Status: None   Collection Time: 08/24/17  1:00 PM  Result Value Ref Range   Lipase 27 11 - 51 U/L  CBC with Differential     Status: Abnormal   Collection Time: 08/24/17  1:00 PM  Result Value Ref Range   WBC 22.9 (H) 4.0 - 10.5 K/uL   RBC 2.51 (L) 4.22 - 5.81 MIL/uL   Hemoglobin 7.8 (L) 13.0 - 17.0 g/dL  HCT 23.1 (L) 39.0 - 52.0 %   MCV 92.0 78.0 - 100.0 fL   MCH 31.1 26.0 - 34.0 pg   MCHC 33.8 30.0 - 36.0 g/dL   RDW 21.4 (H) 11.5 - 15.5 %   Platelets 520 (H) 150 - 400 K/uL   Neutrophils Relative % 61 %   Lymphocytes Relative 30 %   Monocytes Relative 4 %   Eosinophils Relative 0 %   Basophils Relative 0 %   Band Neutrophils 0 %   Metamyelocytes Relative 3 %   Myelocytes 2 %   Promyelocytes Absolute 0 %   Blasts 0 %   nRBC 8 (H) 0 /100 WBC   Other 0 %   Neutro Abs 15.1 (H) 1.7 - 7.7 K/uL   Lymphs Abs 6.9 (H) 0.7 - 4.0 K/uL   Monocytes Absolute 0.9 0.1 - 1.0 K/uL   Eosinophils Absolute 0.0 0.0 - 0.7 K/uL   Basophils Absolute 0.0 0.0 - 0.1 K/uL   RBC Morphology POLYCHROMASIA PRESENT     Comment: TARGET CELLS [name]/JOLLY BODIES Sickle cells present RARE NRBCs    WBC Morphology MILD LEFT SHIFT (1-5% METAS, OCC  MYELO, OCC BANDS)   Reticulocytes     Status: Abnormal   Collection Time: 08/24/17  1:00 PM  Result Value Ref Range   Retic Ct Pct 13.5 (H) 0.4 - 3.1 %   RBC. 2.51 (L) 4.22 - 5.81 MIL/uL   Retic Count, Absolute 338.9 (H) 19.0 - 186.0 K/uL   Dg Chest 2 View  Result Date: 08/24/2017 CLINICAL DATA:  Weakness with nausea and headache. Cough. Sickle cell disease. EXAM: CHEST  2 VIEW COMPARISON:  05/31/2017 FINDINGS: Airway thickening is present, suggesting bronchitis or reactive airways disease. The lungs appear otherwise clear. Cardiac and mediastinal margins appear normal.  No pleural effusion. IMPRESSION: 1. Mild airway thickening may reflect bronchitis or reactive airways disease. Otherwise, no significant abnormalities are observed. Electronically Signed   By: Van Clines M.D.   On: 08/24/2017 12:43    Pending Labs Unresulted Labs (From admission, onward)   Start     Ordered   08/24/17 1506  Culture, blood (routine x 2)  BLOOD CULTURE X 2,   STAT     08/24/17 1505   08/24/17 1450  Influenza panel by PCR (type A & B)  (Influenza PCR Panel)  Once,   R     08/24/17 1450   08/24/17 1408  Urinalysis, Routine w reflex microscopic  STAT,   STAT     08/24/17 1407      Vitals/Pain Today's Vitals   08/24/17 1431 08/24/17 1500 08/24/17 1530 08/24/17 1600  BP:  (!) 102/57 (!) 117/53 (!) 95/51  Pulse:  80 (!) 109 83  Resp:  (!) 21 (!) 21 15  Temp: (!) 100.6 F (38.1 C)     TempSrc: Oral     SpO2:  98% 99% 95%    Isolation Precautions Droplet precaution  Medications Medications  sodium chloride 0.9 % bolus 1,000 mL (0 mLs Intravenous Stopped 08/24/17 1431)  ondansetron (ZOFRAN) injection 4 mg (4 mg Intravenous Given 08/24/17 1403)  acetaminophen (TYLENOL) tablet 650 mg (650 mg Oral Given 08/24/17 1433)  sodium chloride 0.9 % bolus 1,000 mL (1,000 mLs Intravenous New Bag/Given 08/24/17 1608)    Mobility walks with person assist

## 2017-08-25 ENCOUNTER — Inpatient Hospital Stay (HOSPITAL_COMMUNITY): Payer: Medicare Other

## 2017-08-25 DIAGNOSIS — R1115 Cyclical vomiting syndrome unrelated to migraine: Secondary | ICD-10-CM | POA: Diagnosis present

## 2017-08-25 DIAGNOSIS — Z818 Family history of other mental and behavioral disorders: Secondary | ICD-10-CM

## 2017-08-25 DIAGNOSIS — F329 Major depressive disorder, single episode, unspecified: Secondary | ICD-10-CM

## 2017-08-25 DIAGNOSIS — R1110 Vomiting, unspecified: Secondary | ICD-10-CM

## 2017-08-25 DIAGNOSIS — D638 Anemia in other chronic diseases classified elsewhere: Secondary | ICD-10-CM

## 2017-08-25 DIAGNOSIS — E861 Hypovolemia: Secondary | ICD-10-CM

## 2017-08-25 DIAGNOSIS — F32A Depression, unspecified: Secondary | ICD-10-CM

## 2017-08-25 DIAGNOSIS — I9589 Other hypotension: Secondary | ICD-10-CM

## 2017-08-25 DIAGNOSIS — E860 Dehydration: Secondary | ICD-10-CM | POA: Diagnosis present

## 2017-08-25 DIAGNOSIS — F17210 Nicotine dependence, cigarettes, uncomplicated: Secondary | ICD-10-CM

## 2017-08-25 DIAGNOSIS — F332 Major depressive disorder, recurrent severe without psychotic features: Secondary | ICD-10-CM

## 2017-08-25 DIAGNOSIS — I959 Hypotension, unspecified: Secondary | ICD-10-CM | POA: Diagnosis present

## 2017-08-25 LAB — CBC WITH DIFFERENTIAL/PLATELET
BAND NEUTROPHILS: 0 %
BASOS ABS: 0 10*3/uL (ref 0.0–0.1)
BASOS PCT: 0 %
BLASTS: 0 %
EOS PCT: 0 %
Eosinophils Absolute: 0 10*3/uL (ref 0.0–0.7)
HEMATOCRIT: 19.7 % — AB (ref 39.0–52.0)
Hemoglobin: 6.7 g/dL — CL (ref 13.0–17.0)
LYMPHS ABS: 11.2 10*3/uL — AB (ref 0.7–4.0)
LYMPHS PCT: 58 %
MCH: 31.6 pg (ref 26.0–34.0)
MCHC: 34 g/dL (ref 30.0–36.0)
MCV: 92.9 fL (ref 78.0–100.0)
MONO ABS: 1.7 10*3/uL — AB (ref 0.1–1.0)
MONOS PCT: 9 %
Metamyelocytes Relative: 0 %
Myelocytes: 0 %
NEUTROS ABS: 6.3 10*3/uL (ref 1.7–7.7)
Neutrophils Relative %: 33 %
Platelets: 497 10*3/uL — ABNORMAL HIGH (ref 150–400)
Promyelocytes Absolute: 0 %
RBC: 2.12 MIL/uL — ABNORMAL LOW (ref 4.22–5.81)
RDW: 21.7 % — AB (ref 11.5–15.5)
WBC: 19.2 10*3/uL — ABNORMAL HIGH (ref 4.0–10.5)
nRBC: 2 /100 WBC — ABNORMAL HIGH

## 2017-08-25 LAB — RETICULOCYTES
RBC.: 2.06 MIL/uL — AB (ref 4.22–5.81)
Retic Count, Absolute: 267.8 10*3/uL — ABNORMAL HIGH (ref 19.0–186.0)
Retic Ct Pct: 13 % — ABNORMAL HIGH (ref 0.4–3.1)

## 2017-08-25 MED ORDER — SODIUM CHLORIDE 0.9 % IV BOLUS (SEPSIS)
500.0000 mL | Freq: Once | INTRAVENOUS | Status: AC
  Administered 2017-08-25: 500 mL via INTRAVENOUS

## 2017-08-25 MED ORDER — ACETAMINOPHEN 325 MG PO TABS
650.0000 mg | ORAL_TABLET | ORAL | Status: DC | PRN
  Administered 2017-08-25: 650 mg via ORAL
  Filled 2017-08-25: qty 2

## 2017-08-25 NOTE — Consult Note (Addendum)
[place] [facility]iat[facility] Consult   Reason for Consult:  Medication management  Referring Physician:  Dr. Zigmund Daniel  Patient Identification: Jeffrey Morrison MRN:  [id number] Principal Diagnosis: Depression Diagnosis:   Patient Active Problem List   Diagnosis Date Noted  . Dehydration [E86.0] 08/25/2017  . Emesis, persistent [R11.10] 08/25/2017  . Hypotension [I95.9] 08/25/2017  . Febrile illness, acute [R50.9] 08/24/2017  . Syncope and collapse [R55] 08/24/2017  . OSA (obstructive sleep apnea) [G47.33]   . Anemia of chronic disease [D63.8]   . Leukocytosis [D72.829] 01/08/2014    Total Time spent with patient: 1 hour  Subjective:   Jeffrey Morrison is a 21 y.o. male patient admitted with dehydration in the setting of syncope.  HPI:   Per chart review, patient has a history of depression, anxiety and cocaine use. He reports that he was recently incarcerated. He went to [facility] for detox from crack cocaine. He was only there for a week but denies relapse although he has been smoking marijuana since this time. He was previously taking Buspar and Celexa but he reports that the medications were abruptly stopped while incarcerated.   On interview, Jeffrey Morrison denies current problems with anxiety or depression. He denies SI, HI or AVH. He does report seeing "little shadow people" once while incarcerated. He was incarcerated for 5 months for probation violation. He has been out of jail for the past month. 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 He went to a detox program recently and has not used for the past week. He denies cravings. He has not received substance abuse treatment for crack cocaine use. He declines discussing what causes him to use cocaine. He is not currently taking psychotropic medications and declines restarting his prior regimen. They caused daytime sedation. He was previously taking Celexa, Buspar and Risperdal but these medications were stopped while incarcerated. He  denies problems with sleep or appetite.   Past Psychiatric History: Depression and anxiety. He also has a prior history of cutting behaviors.   Risk to Self: Is patient at risk for suicide?: No Risk to Others:  None Prior Inpatient Therapy:  He was hospitalized as a young child for behavioral problems.  Prior Outpatient Therapy:  Unknown  Past Medical History:  Past Medical History:  Diagnosis Date  . Chronic lower back pain   . Sickle cell crisis [facility])     Past Surgical History:  Procedure Laterality Date  . SPLENECTOMY     Family History: No family history on file. Family Psychiatric  History: Maternal aunt-schizophrenia.  Social History:  Social History   Substance and Sexual Activity  Alcohol Use No     Social History   Substance and Sexual Activity  Drug Use No    Social History   Socioeconomic History  . Marital status: Single    Spouse name: None  . Number of children: None  . Years of education: None  . Highest education level: None  Social Needs  . Financial resource strain: None  . Food insecurity - worry: None  . Food insecurity - inability: None  . Transportation needs - medical: None  . Transportation needs - non-medical: None  Occupational History  . None  Tobacco Use  . Smoking status: Current Every Day Smoker    Packs/day: 0.50    Types: Cigarettes  . Smokeless tobacco: Never Used  Substance and Sexual Activity  . Alcohol use: No  . Drug use: No  . Sexual activity: None  Other  Topics Concern  . None  Social History Narrative  . None   Additional Social History: He lives with a friend. 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 He dropped out of school after 10th grade. He has no children and has never been married. He reports crack cocaine use for 4 years and his longest period of sobriety is 1 week. He reports daily marijuana use. He denies alcohol use or IVDU. He reports multiple prior incarcerations. He is currently on  probation for breaking and entering. He denies a history of violence.     Allergies:  No Known Allergies  Labs:  Results for orders placed or performed during the hospital encounter of 08/24/17 (from the past 48 hour(s))  Comprehensive metabolic panel     Status: Abnormal   Collection Time: 08/24/17  1:00 PM  Result Value Ref Range   Sodium 140 135 - 145 mmol/L   Potassium 4.0 3.5 - 5.1 mmol/L   Chloride 108 101 - 111 mmol/L   CO2 27 22 - 32 mmol/L   Glucose, Bld 90 65 - 99 mg/dL   BUN 11 6 - 20 mg/dL   Creatinine, Ser 1.00 0.61 - 1.24 mg/dL   Calcium 8.8 (L) 8.9 - 10.3 mg/dL   Total Protein 7.4 6.5 - 8.1 g/dL   Albumin 3.6 3.5 - 5.0 g/dL   AST 95 (H) 15 - 41 U/L   ALT 136 (H) 17 - 63 U/L   Alkaline Phosphatase 96 38 - 126 U/L   Total Bilirubin 2.6 (H) 0.3 - 1.2 mg/dL   GFR calc non Af Amer >60 >60 mL/min   GFR calc Af Amer >60 >60 mL/min    Comment: (NOTE) The eGFR has been calculated using the CKD EPI equation. This calculation has not been validated in all clinical situations. eGFR's persistently <60 mL/min signify possible Chronic Kidney Disease.    Anion gap 5 5 - 15  Lipase, blood     Status: None   Collection Time: 08/24/17  1:00 PM  Result Value Ref Range   Lipase 27 11 - 51 U/L  CBC with Differential     Status: Abnormal   Collection Time: 08/24/17  1:00 PM  Result Value Ref Range   WBC 22.9 (H) 4.0 - 10.5 K/uL   RBC 2.51 (L) 4.22 - 5.81 MIL/uL   Hemoglobin 7.8 (L) 13.0 - 17.0 g/dL   HCT 23.1 (L) 39.0 - 52.0 %   MCV 92.0 78.0 - 100.0 fL   MCH 31.1 26.0 - 34.0 pg   MCHC 33.8 30.0 - 36.0 g/dL   RDW 21.4 (H) 11.5 - 15.5 %   Platelets 520 (H) 150 - 400 K/uL   Neutrophils Relative % 61 %   Lymphocytes Relative 30 %   Monocytes Relative 4 %   Eosinophils Relative 0 %   Basophils Relative 0 %   Band Neutrophils 0 %   Metamyelocytes Relative 3 %   Myelocytes 2 %   Promyelocytes Absolute 0 %   Blasts 0 %   nRBC 8 (H) 0 /100 WBC   Other 0 %   Neutro Abs  15.1 (H) 1.7 - 7.7 K/uL   Lymphs Abs 6.9 (H) 0.7 - 4.0 K/uL   Monocytes Absolute 0.9 0.1 - 1.0 K/uL   Eosinophils Absolute 0.0 0.0 - 0.7 K/uL   Basophils Absolute 0.0 0.0 - 0.1 K/uL   RBC Morphology POLYCHROMASIA PRESENT     Comment: TARGET CELLS [name]/JOLLY BODIES Sickle  cells present RARE NRBCs    WBC Morphology MILD LEFT SHIFT (1-5% METAS, OCC MYELO, OCC BANDS)   Reticulocytes     Status: Abnormal   Collection Time: 08/24/17  1:00 PM  Result Value Ref Range   Retic Ct Pct 13.5 (H) 0.4 - 3.1 %   RBC. 2.51 (L) 4.22 - 5.81 MIL/uL   Retic Count, Absolute 338.9 (H) 19.0 - 186.0 K/uL  Culture, blood (routine x 2)     Status: None (Preliminary result)   Collection Time: 08/24/17  3:06 PM  Result Value Ref Range   Specimen Description BLOOD LEFT HAND    Special Requests      BOTTLES DRAWN AEROBIC AND ANAEROBIC Blood Culture adequate volume   Culture      NO GROWTH < 24 HOURS Performed at [facility], [place] [address] [place], [place] [address]    Report Status PENDING   Culture, blood (routine x 2)     Status: None (Preliminary result)   Collection Time: 08/24/17  3:11 PM  Result Value Ref Range   Specimen Description BLOOD RIGHT HAND    Special Requests      BOTTLES DRAWN AEROBIC AND ANAEROBIC Blood Culture adequate volume   Culture      NO GROWTH < 24 HOURS Performed at [facility], [place] [address] [place], [place] [address]    Report Status PENDING   Influenza panel by PCR (type A & B)     Status: None   Collection Time: 08/24/17  4:28 PM  Result Value Ref Range   Influenza A By PCR NEGATIVE NEGATIVE   Influenza B By PCR NEGATIVE NEGATIVE    Comment: (NOTE) The Xpert Xpress Flu assay is intended as an aid in the diagnosis of  influenza and should not be used as a sole basis for treatment.  This  assay is FDA approved for nasopharyngeal swab specimens only. Nasal  washings and aspirates are unacceptable for Xpert Xpress Flu testing.   MRSA PCR Screening      Status: None   Collection Time: 08/24/17  6:47 PM  Result Value Ref Range   MRSA by PCR NEGATIVE NEGATIVE    Comment:        The GeneXpert MRSA Assay (FDA approved for NASAL specimens only), is one component of a comprehensive MRSA colonization surveillance program. It is not intended to diagnose MRSA infection nor to guide or monitor treatment for MRSA infections.   Urinalysis, Routine w reflex microscopic     Status: Abnormal   Collection Time: 08/24/17  8:25 PM  Result Value Ref Range   Color, Urine AMBER (A) YELLOW    Comment: BIOCHEMICALS MAY BE AFFECTED BY COLOR   APPearance CLEAR CLEAR   Specific Gravity, Urine 1.015 1.005 - 1.030   pH 5.0 5.0 - 8.0   Glucose, UA NEGATIVE NEGATIVE mg/dL   Hgb urine dipstick SMALL (A) NEGATIVE   Bilirubin Urine NEGATIVE NEGATIVE   Ketones, ur NEGATIVE NEGATIVE mg/dL   Protein, ur 100 (A) NEGATIVE mg/dL   Nitrite NEGATIVE NEGATIVE   Leukocytes, UA NEGATIVE NEGATIVE   RBC / HPF 0-5 0 - 5 RBC/hpf   WBC, UA 0-5 0 - 5 WBC/hpf   Bacteria, UA NONE SEEN NONE SEEN   Squamous Epithelial / LPF NONE SEEN NONE SEEN   Mucus PRESENT   CBC with Differential/Platelet     Status: Abnormal   Collection Time: 08/25/17  5:25 AM  Result Value Ref Range  WBC 19.2 (H) 4.0 - 10.5 K/uL   RBC 2.12 (L) 4.22 - 5.81 MIL/uL   Hemoglobin 6.7 (LL) 13.0 - 17.0 g/dL    Comment: CRITICAL RESULT CALLED TO, READ BACK BY AND VERIFIED WITH: FRAZIER,B. RN '@[id number]'  ON 12.11.18 BY NMCCOY    HCT 19.7 (L) 39.0 - 52.0 %   MCV 92.9 78.0 - 100.0 fL   MCH 31.6 26.0 - 34.0 pg   MCHC 34.0 30.0 - 36.0 g/dL   RDW 21.7 (H) 11.5 - 15.5 %   Platelets 497 (H) 150 - 400 K/uL   Neutrophils Relative % 33 %   Lymphocytes Relative 58 %   Monocytes Relative 9 %   Eosinophils Relative 0 %   Basophils Relative 0 %   Band Neutrophils 0 %   Metamyelocytes Relative 0 %   Myelocytes 0 %   Promyelocytes Absolute 0 %   Blasts 0 %   nRBC 2 (H) 0 /100 WBC   Neutro Abs 6.3 1.7 - 7.7 K/uL    Lymphs Abs 11.2 (H) 0.7 - 4.0 K/uL   Monocytes Absolute 1.7 (H) 0.1 - 1.0 K/uL   Eosinophils Absolute 0.0 0.0 - 0.7 K/uL   Basophils Absolute 0.0 0.0 - 0.1 K/uL   RBC Morphology POLYCHROMASIA PRESENT     Comment: TARGET CELLS [name]/JOLLY BODIES Sickle cells present RARE NRBCs    WBC Morphology MILD LEFT SHIFT (1-5% METAS, OCC MYELO, OCC BANDS)   Reticulocytes     Status: Abnormal   Collection Time: 08/25/17  5:25 AM  Result Value Ref Range   Retic Ct Pct 13.0 (H) 0.4 - 3.1 %   RBC. 2.06 (L) 4.22 - 5.81 MIL/uL   Retic Count, Absolute 267.8 (H) 19.0 - 186.0 K/uL    Current Facility-Administered Medications  Medication Dose Route Frequency Provider Last Rate Last Dose  . dextrose 5 %-0.45 % sodium chloride infusion   Intravenous Continuous [name], MD 100 mL/hr at 08/25/17 0552    . enoxaparin (LOVENOX) injection 40 mg  40 mg Subcutaneous Q24H [name], MD   40 mg at 08/24/17 2019    Musculoskeletal: Strength & Muscle Tone: within normal limits Gait & Station: UTA since patient was lying in bed. Patient leans: N/A  Psychiatric Specialty Exam: Physical Exam  Nursing note and vitals reviewed. Constitutional: He is oriented to person, place, and time. He appears well-developed and well-nourished.  HENT:  Head: Normocephalic and atraumatic.  Neck: Normal range of motion.  Respiratory: Effort normal.  Musculoskeletal: Normal range of motion.  Neurological: He is alert and oriented to person, place, and time.  Skin: No rash noted.  Psychiatric: He has a normal mood and affect. His speech is normal and behavior is normal. Judgment and thought content normal. Cognition and memory are normal.    Review of Systems  Constitutional: Negative for chills and fever.  HENT: Negative for hearing loss.   Eyes: Negative for blurred vision.  Gastrointestinal: Negative for abdominal pain, constipation, diarrhea, nausea and vomiting.  Psychiatric/Behavioral:  Negative for depression, hallucinations, substance abuse and suicidal ideas. The patient is not nervous/anxious and does not have insomnia.     Blood pressure (!) 106/53, pulse 77, temperature 98.4 F (36.9 C), temperature source Oral, resp. rate 18, height '5\' 8"'  (1.727 m), weight 88.2 kg (194 lb 7.1 oz), SpO2 97 %.Body mass index is 29.57 kg/m.  General Appearance: Fairly Groomed, African American male with poor dentition and missing front teeth who is lying in bed.  NAD.   Eye Contact:  Good  Speech:  Clear and Coherent and Normal Rate  Volume:  Normal  Mood:  Euthymic  Affect:  Appropriate and Full Range  Thought Process:  Goal Directed and Linear  Orientation:  Full (Time, Place, and Person)  Thought Content:  Logical  Suicidal Thoughts:  No  Homicidal Thoughts:  No  Memory:  Immediate;   Good Recent;   Good Remote;   Good  Judgement:  Good  Insight:  Good  Psychomotor Activity:  Normal  Concentration:  Concentration: Good and Attention Span: Good  Recall:  Good  Fund of Knowledge:  Good  Language:  Good  Akathisia:  No  Handed:  Right  AIMS (if indicated):   N/A  Assets:  Agricultural consultant Housing Social Support  ADL's:  Intact  Cognition:  WNL  Sleep:   Okay   Assessment:  Jeffrey Morrison is a 21 y.o. male who was admitted with dehydration in the setting of syncope. He denies active psychiatric symptoms at this time including depression, anxiety, SI, HI or AVH. He declines to restart his prior medication regimen which was discontinued while incarcerated. He would like resources for an outpatient psychiatrist as needed in the future.   Treatment Plan Summary: -Patient declines restarting prior medication regimen due to lack of active psychiatric symptoms.  -Please have unit SW provide patient with resources for an outpatient psychiatrist, PCP and substance abuse treatment. -Psychiatry will sign off patient at this time. Please consult  psychiatry again as needed.   Disposition: No evidence of imminent risk to self or others at present.   Patient does not meet criteria for psychiatric inpatient admission.  Faythe Dingwall, DO 08/25/2017 1:20 PM

## 2017-08-25 NOTE — Progress Notes (Signed)
SICKLE CELL SERVICE PROGRESS NOTE  GRIFFON HERBERG NLG:[id number] DOB: January 04, 1996 DOA: 08/24/2017 PCP: Patient, No Pcp Per  Assessment/Plan: Active Problems:   Febrile illness, acute   Syncope and collapse   Leukocytosis   Dehydration   Anemia of chronic disease   Emesis, persistent   Hypotension  1. Syncope: This was most likely due to dehydrated and volume depleted state. So far all findings have ben negative. Pt looks well today and has no symptoms of pre-syncope. 2. Dehydration: Resolved.  3. Hypotension: Improved with IVF. I have reviewed the ECHO from September and his EF was WNL's but he did have some dilation of the LV. Will have patient ambulate and assess his tolerance to activity.  4. Anemia of Chronic Disease: Hb at baseline 5. 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 However not currently on Hydrea and Folic Acid 6. Leukocytosis: Pt has chronic leukocytosis with baseline WBC at 15-16. Will re-assess tomorrow.  7. Opiate withdrawal: I suspect that some of the emesis was due to Opiate withdrawal. No current symptoms.  8. Depression and Anxiety: Psychiatry to see today.  9. Recent incarceration and high risk lifestyle: Pt and family requesting HIV test. Ordered.     Code Status: Full Code Family Communication: N/A Disposition Plan: Anticipate discharge tomorrow  Hattye Siegfried A.  Pager [phone]. If 7PM-7AM, please contact night-coverage.  08/25/2017, 1:10 PM  LOS: 1 day   Interim History: Pt reports that he feels much better today. He now recalls that prior to "fainting" yesterday. He had feelings of dizziness. Hehas been able to eat without any emesis but still has some occasional nausea. Pt denies any pain at this time. CT head noted to be negative for any  Pt has asked that I speak with Mrs. Linus Mako with whose family he currently resides and update her on his current medical situation(which I did). She has requested and he has agreed to an HIV test.   He is interested in out-patient drug rehabilitation.   Consultants:  None  Procedures:  None  Antibiotics:  None  Review of Studies:  CT: Negative for any acute findings  ECHO (06/03/2017): moderate dilation of left Ventricle, normal EF (55-60%). Mild Aortic and Mitral Regurgitation.   Objective: Vitals:   08/24/17 1802 08/24/17 2000 08/24/17 2353 08/25/17 0528  BP: 116/65 (!) 103/51 (!) 110/57 (!) 106/53  Pulse: 76 84 91 77  Resp: 16 16 14 18   Temp: 99.4 F (37.4 C) 99.4 F (37.4 C) (!) 101 F (38.3 C) 98.4 F (36.9 C)  TempSrc: Oral Oral Oral Oral  SpO2:  97% 98% 97%  Weight: 88.2 kg (194 lb 7.1 oz)     Height: 5\' 8"  (1.727 m)      Weight change:   Intake/Output Summary (Last 24 hours) at 08/25/2017 1310 Last data filed at 08/25/2017 0300 Gross per 24 hour  Intake 1148.33 ml  Output 1175 ml  Net -26.67 ml     Physical Exam General: Alert, awake, oriented x3, in no acute distress.  HEENT: [place]/AT PEERL, EOMI, anicteric Neck: Trachea midline,  no masses, no thyromegal,y no JVD, no carotid bruit OROPHARYNX:  Moist, No exudate/ erythema/lesions.  Heart: Regular rate and rhythm, without murmurs, rubs, gallops, PMI non-displaced, no heaves or thrills on palpation.  Lungs: Clear to auscultation, no wheezing or rhonchi noted. No increased vocal fremitus resonant to percussion  Abdomen: Soft, nontender, nondistended, positive bowel sounds, no masses no hepatosplenomegaly noted.  Neuro: No focal neurological deficits noted cranial  nerves II through XII grossly intact. Strength at baseline in bilateral upper and lower extremities. Musculoskeletal: No warmth swelling or erythema around joints, no spinal tenderness noted. Psychiatric: Patient alert and oriented x3, good insight and cognition, good recent to remote recall.     Data Reviewed: Basic Metabolic Panel: Recent Labs  Lab 08/24/17 1300  NA 140  K 4.0  CL 108  CO2 27  GLUCOSE 90  BUN 11  CREATININE  1.00  CALCIUM 8.8*   Liver Function Tests: Recent Labs  Lab 08/24/17 1300  AST 95*  ALT 136*  ALKPHOS 96  BILITOT 2.6*  PROT 7.4  ALBUMIN 3.6   Recent Labs  Lab 08/24/17 1300  LIPASE 27   No results for input(s): AMMONIA in the last 168 hours. CBC: Recent Labs  Lab 08/24/17 1300 08/25/17 0525  WBC 22.9* 19.2*  NEUTROABS 15.1* 6.3  HGB 7.8* 6.7*  HCT 23.1* 19.7*  MCV 92.0 92.9  PLT 520* 497*   Cardiac Enzymes: No results for input(s): CKTOTAL, CKMB, CKMBINDEX, TROPONINI in the last 168 hours. BNP (last 3 results) No results for input(s): BNP in the last 8760 hours.  ProBNP (last 3 results) No results for input(s): PROBNP in the last 8760 hours.  CBG: No results for input(s): GLUCAP in the last 168 hours.  Recent Results (from the past 240 hour(s))  Culture, blood (routine x 2)     Status: None (Preliminary result)   Collection Time: 08/24/17  3:06 PM  Result Value Ref Range Status   Specimen Description BLOOD LEFT HAND  Final   Special Requests   Final    BOTTLES DRAWN AEROBIC AND ANAEROBIC Blood Culture adequate volume   Culture   Final    NO GROWTH < 24 HOURS Performed at [facility], [address], [place], [place] [address]    Report Status PENDING  Incomplete  Culture, blood (routine x 2)     Status: None (Preliminary result)   Collection Time: 08/24/17  3:11 PM  Result Value Ref Range Status   Specimen Description BLOOD RIGHT HAND  Final   Special Requests   Final    BOTTLES DRAWN AEROBIC AND ANAEROBIC Blood Culture adequate volume   Culture   Final    NO GROWTH < 24 HOURS Performed at [facility], [place] [address], [place], [place] [address]    Report Status PENDING  Incomplete  MRSA PCR Screening     Status: None   Collection Time: 08/24/17  6:47 PM  Result Value Ref Range Status   MRSA by PCR NEGATIVE NEGATIVE Final    Comment:        The GeneXpert MRSA Assay (FDA approved for NASAL specimens only), is one component of  a comprehensive MRSA colonization surveillance program. It is not intended to diagnose MRSA infection nor to guide or monitor treatment for MRSA infections.      Studies: Dg Chest 2 View  Result Date: 08/24/2017 CLINICAL DATA:  Weakness with nausea and headache. Cough. Sickle cell disease. EXAM: CHEST  2 VIEW COMPARISON:  05/31/2017 FINDINGS: Airway thickening is present, suggesting bronchitis or reactive airways disease. The lungs appear otherwise clear. Cardiac and mediastinal margins appear normal.  No pleural effusion. IMPRESSION: 1. Mild airway thickening may reflect bronchitis or reactive airways disease. Otherwise, no significant abnormalities are observed. Electronically Signed   By: Van Clines M.D.   On: 08/24/2017 12:43   Ct Head Wo Contrast  Result Date: 08/25/2017 CLINICAL DATA:  Multiple syncopal episodes.  Vomiting. EXAM: CT HEAD WITHOUT CONTRAST TECHNIQUE: Contiguous axial images were obtained from the base of the skull through the vertex without intravenous contrast. COMPARISON:  08/30/2015 FINDINGS: Brain: There is no evidence of acute infarct, intracranial hemorrhage, mass, midline shift, or extra-axial fluid collection. The ventricles and sulci are normal. Vascular: No hyperdense vessel. Skull: No fracture or focal osseous lesion. Sinuses/Orbits: Visualized paranasal sinuses and mastoid air cells are clear. Visualized orbits are unremarkable. Other: None. IMPRESSION: Negative head CT. Electronically Signed   By: Logan Bores M.D.   On: 08/25/2017 09:46    Scheduled Meds: . enoxaparin (LOVENOX) injection  40 mg Subcutaneous Q24H   Continuous Infusions: . dextrose 5 % and 0.45% NaCl 100 mL/hr at 08/25/17 6948    Active Problems:   Febrile illness, acute   Syncope and collapse   Leukocytosis   Dehydration   Anemia of chronic disease   Emesis, persistent   Hypotension      In excess of 40 minutes spent during this visit. Greater than 50% involved face  to face contact with the patient for assessment, counseling and coordination of care.

## 2017-08-25 NOTE — Care Management Note (Signed)
Case Management Note  Patient Details  Name: Jeffrey Morrison MRN: [id number] Date of Birth: May 28, 1996  Subjective/Objective:                  Dehydration and syncope  Action/Plan: Date: August 25, 2017 Velva Harman, BSN, [place], [place] Chart and notes review for patient progress and needs. Will follow for case management and discharge needs. Next review date: [id number] Expected Discharge Date:  (unknown)               Expected Discharge Plan:  Home/Self Care  In-House Referral:     Discharge planning Services  CM Consult  Post Acute Care Choice:    Choice offered to:     DME Arranged:    DME Agency:     HH Arranged:    HH Agency:     Status of Service:  In process, will continue to follow  If discussed at Long Length of Stay Meetings, dates discussed:    Additional Comments:  Leeroy Cha, RN 08/25/2017, 8:12 AM

## 2017-08-25 NOTE — Progress Notes (Signed)
CRITICAL VALUE ALERT  Critical Value:  hgb 6.7  Date & Time Notied:  12/11 0711  Provider Notified: Dr. Zigmund Daniel   Orders Received/Actions taken: Awaiting orders

## 2017-08-25 NOTE — Progress Notes (Signed)
Patient temp 102. No Tylenol ordered. Paged on call provider for order. Cool cloths placed. Will c/t monitor.

## 2017-08-26 ENCOUNTER — Inpatient Hospital Stay (HOSPITAL_COMMUNITY): Payer: Medicare Other

## 2017-08-26 DIAGNOSIS — D72820 Lymphocytosis (symptomatic): Secondary | ICD-10-CM

## 2017-08-26 LAB — COMPREHENSIVE METABOLIC PANEL
ALBUMIN: 3.3 g/dL — AB (ref 3.5–5.0)
ALT: 149 U/L — ABNORMAL HIGH (ref 17–63)
ANION GAP: 6 (ref 5–15)
AST: 130 U/L — ABNORMAL HIGH (ref 15–41)
Alkaline Phosphatase: 96 U/L (ref 38–126)
BILIRUBIN TOTAL: 2.4 mg/dL — AB (ref 0.3–1.2)
BUN: <5 mg/dL — ABNORMAL LOW (ref 6–20)
CALCIUM: 8.4 mg/dL — AB (ref 8.9–10.3)
CO2: 25 mmol/L (ref 22–32)
Chloride: 108 mmol/L (ref 101–111)
Creatinine, Ser: 0.91 mg/dL (ref 0.61–1.24)
GLUCOSE: 96 mg/dL (ref 65–99)
POTASSIUM: 3.9 mmol/L (ref 3.5–5.1)
Sodium: 139 mmol/L (ref 135–145)
TOTAL PROTEIN: 6.7 g/dL (ref 6.5–8.1)

## 2017-08-26 LAB — CBC WITH DIFFERENTIAL/PLATELET
BAND NEUTROPHILS: 0 %
BASOS ABS: 0 10*3/uL (ref 0.0–0.1)
BLASTS: 0 %
Basophils Relative: 0 %
EOS ABS: 0 10*3/uL (ref 0.0–0.7)
Eosinophils Relative: 0 %
HEMATOCRIT: 20.4 % — AB (ref 39.0–52.0)
Hemoglobin: 7.1 g/dL — ABNORMAL LOW (ref 13.0–17.0)
LYMPHS ABS: 15.6 10*3/uL — AB (ref 0.7–4.0)
Lymphocytes Relative: 58 %
MCH: 32.1 pg (ref 26.0–34.0)
MCHC: 34.8 g/dL (ref 30.0–36.0)
MCV: 92.3 fL (ref 78.0–100.0)
METAMYELOCYTES PCT: 0 %
MONOS PCT: 3 %
Monocytes Absolute: 0.8 10*3/uL (ref 0.1–1.0)
Myelocytes: 1 %
Neutro Abs: 10.5 10*3/uL — ABNORMAL HIGH (ref 1.7–7.7)
Neutrophils Relative %: 38 %
PROMYELOCYTES ABS: 0 %
Platelets: 510 10*3/uL — ABNORMAL HIGH (ref 150–400)
RBC: 2.21 MIL/uL — ABNORMAL LOW (ref 4.22–5.81)
RDW: 21.5 % — AB (ref 11.5–15.5)
WBC: 26.9 10*3/uL — ABNORMAL HIGH (ref 4.0–10.5)
nRBC: 4 /100 WBC — ABNORMAL HIGH

## 2017-08-26 LAB — PROCALCITONIN: Procalcitonin: 0.42 ng/mL

## 2017-08-26 LAB — RETICULOCYTES
RBC.: 2.21 MIL/uL — AB (ref 4.22–5.81)
RETIC COUNT ABSOLUTE: 360.2 10*3/uL — AB (ref 19.0–186.0)
RETIC CT PCT: 16.3 % — AB (ref 0.4–3.1)

## 2017-08-26 LAB — HIV ANTIBODY (ROUTINE TESTING W REFLEX): HIV SCREEN 4TH GENERATION: NONREACTIVE

## 2017-08-26 MED ORDER — ACETAMINOPHEN 325 MG PO TABS
650.0000 mg | ORAL_TABLET | ORAL | Status: DC | PRN
  Administered 2017-08-26: 650 mg via ORAL
  Filled 2017-08-26: qty 2

## 2017-08-26 MED ORDER — IBUPROFEN 200 MG PO TABS: 400.0000 mg | ORAL_TABLET | Freq: Four times a day (QID) | ORAL | Status: DC | PRN

## 2017-08-26 NOTE — Progress Notes (Signed)
SICKLE CELL SERVICE PROGRESS NOTE  Jeffrey Morrison QQP:[id number] DOB: 12/30/1995 DOA: 08/24/2017 PCP: Patient, No Pcp Per  Assessment/Plan: Principal Problem:   Depression Active Problems:   Febrile illness, acute   Syncope and collapse   Leukocytosis   Dehydration   Anemia of chronic disease   Emesis, persistent   Hypotension  1. Febrile Illness: Pt likely has a viral syndrome characterized by a fever of Tm 102,  generalized chills and an escalation of WBC with a lymphocytic predominance. I will check hepatic panel and also CMV and EBV titers. Continue Supportive care.  2. Syncope: This was most likely due to dehydrated and volume depleted state. So far all findings have ben negative. Pt looks well today and has no symptoms of pre-syncope. 3. Dehydration: Resolved.  4. Hypotension: Improved with IVF. I have reviewed the ECHO from September and his EF was WNL's but he did have some dilation of the LV. Will have patient ambulate and assess his tolerance to activity.  5. Anemia of Chronic Disease: Hb at baseline 6. 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 However not currently on Hydrea and Folic Acid 7. Leukocytosis: Now with Lymphocytosis which I believe to be secondary to viral process.  8. Opiate withdrawal: I suspect that some of the emesis was due to Opiate withdrawal. No current symptoms.  9. Depression and Anxiety: Psychiatry to see today.  10. Recent incarceration and high risk lifestyle:  HIV non-reactive.    Code Status: Full Code Family Communication: N/A Disposition Plan: Not yet ready for discharge  [place].  Pager [phone]. If 7PM-7AM, please contact night-coverage.  08/26/2017, 2:18 PM  LOS: 2 days   Interim History: Pt reports that he feels achy all over today. He has a headache in the temporal region bilaterally which he states usually responds to Ibuprofen. He denies any pain of sickle cell crisis.   Pt has asked that I speak with Mrs. Linus Mako with whose family he currently resides and update her on his current medical situation(which I did).   He is interested in out-patient drug rehabilitation.   Consultants:  None  Procedures:  None  Antibiotics:  None  Review of Studies:  CT: Negative for any acute findings  ECHO (06/03/2017): moderate dilation of left Ventricle, normal EF (55-60%). Mild Aortic and Mitral Regurgitation.  CXR: negative.    Objective: Vitals:   08/25/17 2100 08/25/17 2230 08/26/17 0519 08/26/17 1339  BP: (!) 119/56  119/67 110/60  Pulse: 91  80 79  Resp: 18  18 18   Temp: (!) 102 F (38.9 C) 99 F (37.2 C) 99.6 F (37.6 C) 99.7 F (37.6 C)  TempSrc: Oral Oral Oral Oral  SpO2: 96%  96% 100%  Weight:      Height:       Weight change:   Intake/Output Summary (Last 24 hours) at 08/26/2017 1418 Last data filed at 08/26/2017 1300 Gross per 24 hour  Intake 1417.5 ml  Output 2400 ml  Net -982.5 ml     Physical Exam General: Alert, awake, oriented x3, in mild distress due to headache.Marland Kitchen  HEENT: [place]/AT PEERL, EOMI, anicteric Neck: Trachea midline,  no masses, no thyromegal,y no JVD, no carotid bruit OROPHARYNX:  Moist, No exudate/ erythema/lesions.  Heart: Regular rate and rhythm, without murmurs, rubs, gallops, PMI non-displaced, no heaves or thrills on palpation.  Lungs: Clear to auscultation, no wheezing or rhonchi noted. No increased vocal fremitus resonant to percussion  Abdomen: Soft, nontender, nondistended, positive  bowel sounds, no masses no hepatosplenomegaly noted.  Neuro: No focal neurological deficits noted cranial nerves II through XII grossly intact. Strength at baseline in bilateral upper and lower extremities. Musculoskeletal: No warmth swelling or erythema around joints, no spinal tenderness noted. Psychiatric: Patient alert and oriented x3, good insight and cognition, good recent to remote recall.     Data Reviewed: Basic Metabolic Panel: Recent Labs  Lab  08/24/17 1300  NA 140  K 4.0  CL 108  CO2 27  GLUCOSE 90  BUN 11  CREATININE 1.00  CALCIUM 8.8*   Liver Function Tests: Recent Labs  Lab 08/24/17 1300  AST 95*  ALT 136*  ALKPHOS 96  BILITOT 2.6*  PROT 7.4  ALBUMIN 3.6   Recent Labs  Lab 08/24/17 1300  LIPASE 27   No results for input(s): AMMONIA in the last 168 hours. CBC: Recent Labs  Lab 08/24/17 1300 08/25/17 0525 08/26/17 0528  WBC 22.9* 19.2* 26.9*  NEUTROABS 15.1* 6.3 10.5*  HGB 7.8* 6.7* 7.1*  HCT 23.1* 19.7* 20.4*  MCV 92.0 92.9 92.3  PLT 520* 497* 510*   Cardiac Enzymes: No results for input(s): CKTOTAL, CKMB, CKMBINDEX, TROPONINI in the last 168 hours. BNP (last 3 results) No results for input(s): BNP in the last 8760 hours.  ProBNP (last 3 results) No results for input(s): PROBNP in the last 8760 hours.  CBG: No results for input(s): GLUCAP in the last 168 hours.  Recent Results (from the past 240 hour(s))  Culture, blood (routine x 2)     Status: None (Preliminary result)   Collection Time: 08/24/17  3:06 PM  Result Value Ref Range Status   Specimen Description BLOOD LEFT HAND  Final   Special Requests   Final    BOTTLES DRAWN AEROBIC AND ANAEROBIC Blood Culture adequate volume   Culture   Final    NO GROWTH < 24 HOURS Performed at [facility], [address] [place], [place] [address]    Report Status PENDING  Incomplete  Culture, blood (routine x 2)     Status: None (Preliminary result)   Collection Time: 08/24/17  3:11 PM  Result Value Ref Range Status   Specimen Description BLOOD RIGHT HAND  Final   Special Requests   Final    BOTTLES DRAWN AEROBIC AND ANAEROBIC Blood Culture adequate volume   Culture   Final    NO GROWTH < 24 HOURS Performed at [facility], [place] [address] [place], [place] [address]    Report Status PENDING  Incomplete  MRSA PCR Screening     Status: None   Collection Time: 08/24/17  6:47 PM  Result Value Ref Range Status   MRSA by PCR  NEGATIVE NEGATIVE Final    Comment:        The GeneXpert MRSA Assay (FDA approved for NASAL specimens only), is one component of a comprehensive MRSA colonization surveillance program. It is not intended to diagnose MRSA infection nor to guide or monitor treatment for MRSA infections.      Studies: Dg Chest 2 View  Result Date: 08/26/2017 CLINICAL DATA:  Fever EXAM: CHEST  2 VIEW COMPARISON:  08/24/2017 FINDINGS: The heart size and mediastinal contours are within normal limits. Both lungs are clear. The visualized skeletal structures are unremarkable. IMPRESSION: No active cardiopulmonary disease. Electronically Signed   By: Kathreen Devoid   On: 08/26/2017 10:56   Dg Chest 2 View  Result Date: 08/24/2017 CLINICAL DATA:  Weakness with nausea  and headache. Cough. Sickle cell disease. EXAM: CHEST  2 VIEW COMPARISON:  05/31/2017 FINDINGS: Airway thickening is present, suggesting bronchitis or reactive airways disease. The lungs appear otherwise clear. Cardiac and mediastinal margins appear normal.  No pleural effusion. IMPRESSION: 1. Mild airway thickening may reflect bronchitis or reactive airways disease. Otherwise, no significant abnormalities are observed. Electronically Signed   By: Van Clines M.D.   On: 08/24/2017 12:43   Ct Head Wo Contrast  Result Date: 08/25/2017 CLINICAL DATA:  Multiple syncopal episodes.  Vomiting. EXAM: CT HEAD WITHOUT CONTRAST TECHNIQUE: Contiguous axial images were obtained from the base of the skull through the vertex without intravenous contrast. COMPARISON:  08/30/2015 FINDINGS: Brain: There is no evidence of acute infarct, intracranial hemorrhage, mass, midline shift, or extra-axial fluid collection. The ventricles and sulci are normal. Vascular: No hyperdense vessel. Skull: No fracture or focal osseous lesion. Sinuses/Orbits: Visualized paranasal sinuses and mastoid air cells are clear. Visualized orbits are unremarkable. Other: None. IMPRESSION:  Negative head CT. Electronically Signed   By: Logan Bores M.D.   On: 08/25/2017 09:46    Scheduled Meds: . enoxaparin (LOVENOX) injection  40 mg Subcutaneous Q24H   Continuous Infusions: . dextrose 5 % and 0.45% NaCl 10 mL/hr at 08/25/17 1719    Principal Problem:   Depression Active Problems:   Febrile illness, acute   Syncope and collapse   Leukocytosis   Dehydration   Anemia of chronic disease   Emesis, persistent   Hypotension      In excess of 30 minutes spent during this visit. Greater than 50% involved face to face contact with the patient for assessment, counseling and coordination of care.

## 2017-08-27 DIAGNOSIS — B259 Cytomegaloviral disease, unspecified: Secondary | ICD-10-CM | POA: Diagnosis present

## 2017-08-27 DIAGNOSIS — N529 Male erectile dysfunction, unspecified: Secondary | ICD-10-CM | POA: Diagnosis present

## 2017-08-27 LAB — CMV IGM: CMV IgM: 105 AU/mL — ABNORMAL HIGH (ref 0.0–29.9)

## 2017-08-27 LAB — EBV AB TO VIRAL CAPSID AG PNL, IGG+IGM

## 2017-08-27 LAB — EPSTEIN-BARR VIRUS NUCLEAR ANTIGEN ANTIBODY, IGG: EBV NA IGG: 391 U/mL — AB (ref 0.0–17.9)

## 2017-08-27 LAB — EPSTEIN-BARR VIRUS EARLY D ANTIGEN ANTIBODY, IGG

## 2017-08-27 MED ORDER — IBUPROFEN 400 MG PO TABS: 400.0000 mg | ORAL_TABLET | Freq: Four times a day (QID) | ORAL | 0 refills | Status: DC | PRN

## 2017-08-27 NOTE — Care Management Important Message (Signed)
Important Message  Patient Details  Name: Jeffrey Morrison MRN: [id number] Date of Birth: Feb 23, 1996   Medicare Important Message Given:  Yes    Kerin Salen 08/27/2017, 11:31 AMImportant Message  Patient Details  Name: Jeffrey Morrison MRN: [id number] Date of Birth: 1996-07-07   Medicare Important Message Given:  Yes    Kerin Salen 08/27/2017, 11:30 AM

## 2017-08-27 NOTE — Progress Notes (Signed)
Pts IV removed with a clean and dry dressing intact. Pt denies pain at the time of d/c

## 2017-08-27 NOTE — Discharge Summary (Signed)
Jeffrey Morrison MRN: [id number] DOB/AGE: 1995-12-26 21 y.o.  Admit date: 08/24/2017 Discharge date: 08/27/2017  Primary Care Physician:  Patient, No Pcp Per   Discharge Diagnoses:   Patient Active Problem List   Diagnosis Date Noted  . 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 Erectile dysfunction 08/27/2017  . Depression 08/25/2017  . OSA (obstructive sleep apnea)   . Anemia of chronic disease     DISCHARGE MEDICATION: Allergies as of 08/27/2017   No Known Allergies     Medication List    STOP taking these medications   busPIRone 10 MG tablet Commonly known as:  BUSPAR   citalopram 40 MG tablet Commonly known as:  CELEXA   hydroxyurea 500 MG capsule Commonly known as:  HYDREA   Oxycodone HCl 10 MG Tabs     TAKE these medications   folic acid 1 MG tablet Commonly known as:  FOLVITE Take 1 tablet (1 mg total) by mouth daily.   ibuprofen 400 MG tablet Commonly known as:  ADVIL,MOTRIN Take 1 tablet (400 mg total) by mouth every 6 (six) hours as needed for headache.         Consults:    SIGNIFICANT DIAGNOSTIC STUDIES:  Dg Chest 2 View  Result Date: 08/26/2017 CLINICAL DATA:  Fever EXAM: CHEST  2 VIEW COMPARISON:  08/24/2017 FINDINGS: The heart size and mediastinal contours are within normal limits. Both lungs are clear. The visualized skeletal structures are unremarkable. IMPRESSION: No active cardiopulmonary disease. Electronically Signed   By: Jeffrey Morrison   On: 08/26/2017 10:56   Dg Chest 2 View  Result Date: 08/24/2017 CLINICAL DATA:  Weakness with nausea and headache. Cough. Sickle cell disease. EXAM: CHEST  2 VIEW COMPARISON:  05/31/2017 FINDINGS: Airway thickening is present, suggesting bronchitis or reactive airways disease. The lungs appear otherwise clear. Cardiac and mediastinal margins appear normal.  No pleural effusion. IMPRESSION: 1. Mild airway thickening may reflect bronchitis or reactive airways disease. Otherwise, no significant  abnormalities are observed. Electronically Signed   By: Jeffrey Morrison M.D.   On: 08/24/2017 12:43   Ct Head Wo Contrast  Result Date: 08/25/2017 CLINICAL DATA:  Multiple syncopal episodes.  Vomiting. EXAM: CT HEAD WITHOUT CONTRAST TECHNIQUE: Contiguous axial images were obtained from the base of the skull through the vertex without intravenous contrast. COMPARISON:  08/30/2015 FINDINGS: Brain: There is no evidence of acute infarct, intracranial hemorrhage, mass, midline shift, or extra-axial fluid collection. The ventricles and sulci are normal. Vascular: No hyperdense vessel. Skull: No fracture or focal osseous lesion. Sinuses/Orbits: Visualized paranasal sinuses and mastoid air cells are clear. Visualized orbits are unremarkable. Other: None. IMPRESSION: Negative head CT. Electronically Signed   By: Jeffrey Morrison M.D.   On: 08/25/2017 09:46      Recent Results (from the past 240 hour(s))  Culture, blood (routine x 2)     Status: None (Preliminary result)   Collection Time: 08/24/17  3:06 PM  Result Value Ref Range Status   Specimen Description BLOOD LEFT HAND  Final   Special Requests   Final    BOTTLES DRAWN AEROBIC AND ANAEROBIC Blood Culture adequate volume   Culture   Final    NO GROWTH 2 DAYS Performed at [facility], [place] [address], [place], [place] [address]    Report Status PENDING  Incomplete  Culture, blood (routine x 2)     Status: None (Preliminary result)   Collection Time: 08/24/17  3:11 PM  Result Value Ref Range Status  Specimen Description BLOOD RIGHT HAND  Final   Special Requests   Final    BOTTLES DRAWN AEROBIC AND ANAEROBIC Blood Culture adequate volume   Culture   Final    NO GROWTH 2 DAYS Performed at [facility], [address] [place], [place] [address]    Report Status PENDING  Incomplete  MRSA PCR Screening     Status: None   Collection Time: 08/24/17  6:47 PM  Result Value Ref Range Status   MRSA by PCR NEGATIVE NEGATIVE Final     Comment:        The GeneXpert MRSA Assay (FDA approved for NASAL specimens only), is one component of a comprehensive MRSA colonization surveillance program. It is not intended to diagnose MRSA infection nor to guide or monitor treatment for MRSA infections.     BRIEF ADMITTING H & P: Jeffrey Morrison is a patient with Hb SS who presented to the ED today after having 3 episodes of syncope today. According to the family with friend with whom the patient lives, he had several episodes of emesis within the last day. However this morning they heard a thump upstairs and went up to find the "passed out". He regained a conscious state after a few seconds after they heard the sound.This apparently happened 2 more times and EMS was called. Patient is unable to account any details of the events this morning. He knows that he passed out but was unable to give any details regarding dizziness or palpitations preeceeding the episodes of "passing out". He reports more than 12 episodes of emesis which were non-bilious or without blood. Pt had been using crack cocaine until 1 week ago when he went to [facility] for "detox". However after a few hours he was discharged as the hospital was unable to provide "detox' for crack cocaine., Alternatively, he stopped using crack cocaine "cold [place]" in since last Monday (1 week ago today) and states that her has been feeling "like crap" since then. He has been smoking Marijuana since then.  Pt denies any current pain. He was incarcerated until 1 month ago and since then has not had any of his previously prescribed medications. He was previously taking Hydrea, Folic Acid, Buspar and Celexa all of which were abruptly discontinued at the time of release from prison. He does not currently have a primary provider. He denies any fevers, but does admit to chills and a non-productive cough in the last several days. Pt also admits to decreased urine output and describes urine as dark. He also c/o  feeling thirsty and is requesting something to drink. He has had no further emesis since arrival to the ED.   In the ED he has a CXR which was negative for any acute findings. He was   noted to have low BP and has received 2 L of 0.9 NS. He was afebrile on arrival  But has since developed a temperature of 100.6. Antibiotics were ordered in suspicion of a possible pneumonia that has not yet shown on CXR however I held the antibiotics and the patient did not receive them as he has no compelling history or clinical symptoms of a pneumonia. I am admitting the patient for dehydration and syncopal episodes.      Hospital Course:  Present on Admission: . (Resolved) Febrile illness, acute . (Resolved) Syncope and collapse . (Resolved) Leukocytosis . Anemia of chronic disease . (Resolved) Dehydration . (Resolved) Emesis, persistent . (Resolved) Hypotension . CMV (cytomegalovirus) ([place]) . Erectile  dysfunction  1. CMV Infection: Patient was found to have titers elevated for CMV indicative of an acute CMV infection. This would explain all of the patients symptoms which were treated symptomatically and the patient improved and was discharged home in stable condition.  2. Syncope: Pt had a CT of his head which was negative. His Syncope was secondary to CMV viral syndrome and associated dehydration.  All presyncopal symptoms resolved after viral syndrome resolved. 3. Dehydration: Secondary to vomiting associated with CMV viral syndrome.  The patient was rehydrated with IV fluids and dehydration was corrected. 4. Emesis: Secondary to viral syndrome and dehydration.  This resolved after the patient's volumes was replenished and the patient was tolerating a diet without difficulty at the time of discharge.   5. 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  Since being out of prison he has not had any of his medications.  Currently he does not of a primary care provider.  He has  been given information for an appointment at the patient care center for January 18 at 10:30 AM toestablish care. Patient has been needs to be restarted on Hydrea 1000 mg when seen in the ambulatory setting.  6. Erectile Dysfunction: Patient reports that he has had erectile dysfunction since his last episode of priapism that required hospitalization.  He will need to be referred to urology as an outpatient for further evaluation of this. 7. H/O OSA: Review of his history shows that he does have obstructive sleep apnea.  He is never been on a CPAP machine.  He needs a polysomnogram with titration to determine whether or not this patient has obstructive sleep apnea and will pressure as he needs to be treated.  Disposition and Follow-up: Patient is discharged home in good condition.  He is to follow-up on January 18 with the appointment that has been arranged for him. Discharge Instructions    Activity as tolerated - No restrictions   Complete by:  As directed    Diet general   Complete by:  As directed       DISCHARGE EXAM:  General: Alert, awake, oriented x 3.  Patient is well-appearing and in no distress.  He is ambulatory without any symptoms of presyncope dizziness or fatigue. HEENT: [place]/AT PEERL, EOMI, anicteric Neck: Trachea midline, no masses, no thyromegal,y no JVD, no carotid bruit OROPHARYNX: Moist, No exudate/ erythema/lesions.  Heart: Regular rate and rhythm, without murmurs, rubs, gallops or S3. PMI non-displaced. Exam reveals no decreased pulses. Pulmonary/Chest: Normal effort. Breath sounds normal. No. Apnea. Clear to auscultation,no stridor,  no wheezing and no rhonchi noted. No respiratory distress and no tenderness noted. Abdomen: Soft, nontender, nondistended, normal bowel sounds, no masses no hepatosplenomegaly noted. No fluid wave and no ascites. There is no guarding or rebound. Neuro: Alert and oriented to person, place and time. Normal motor skills, Displays no atrophy or  tremors and exhibits normal muscle tone.  No focal neurological deficits noted cranial nerves II through XII grossly intact. No sensory deficit noted.  Strength at baseline in bilateral upper and lower extremities. Gait normal. Musculoskeletal: No warmth swelling or erythema around joints, no spinal tenderness noted. Psychiatric: Patient alert and oriented x3, good insight and cognition, good recent to remote recall. Lymph node survey: No cervical axillary or inguinal lymphadenopathy noted. Skin: Skin is warm and dry. No bruising, no ecchymosis and no rash noted. Pt is not diaphoretic. No erythema. No pallor    Blood pressure 132/77, pulse 83,  temperature 99.6 F (37.6 C), temperature source Oral, resp. rate 18, height 5\' 8"  (1.727 m), weight 88.2 kg (194 lb 7.1 oz), SpO2 100 %.  Recent Labs    08/24/17 1300 08/26/17 1501  NA 140 139  K 4.0 3.9  CL 108 108  CO2 27 25  GLUCOSE 90 96  BUN 11 <5*  CREATININE 1.00 0.91  CALCIUM 8.8* 8.4*   Recent Labs    08/24/17 1300 08/26/17 1501  AST 95* 130*  ALT 136* 149*  ALKPHOS 96 96  BILITOT 2.6* 2.4*  PROT 7.4 6.7  ALBUMIN 3.6 3.3*   Recent Labs    08/24/17 1300  LIPASE 27   Recent Labs    08/25/17 0525 08/26/17 0528  WBC 19.2* 26.9*  NEUTROABS 6.3 10.5*  HGB 6.7* 7.1*  HCT 19.7* 20.4*  MCV 92.9 92.3  PLT 497* 510*     Total time spent including face to face and decision making was greater than 30 minutes  Signed: Mel Langan A. 08/27/2017, 11:24 AM

## 2017-08-29 LAB — CULTURE, BLOOD (ROUTINE X 2)
Culture: NO GROWTH
Culture: NO GROWTH
Special Requests: ADEQUATE
Special Requests: ADEQUATE

## 2017-09-01 ENCOUNTER — Ambulatory Visit (INDEPENDENT_AMBULATORY_CARE_PROVIDER_SITE_OTHER): Payer: Medicare Other | Admitting: Family Medicine

## 2017-09-01 ENCOUNTER — Telehealth: Payer: Self-pay | Admitting: Family Medicine

## 2017-09-01 ENCOUNTER — Encounter: Payer: Self-pay | Admitting: Family Medicine

## 2017-09-01 VITALS — BP 116/72 | HR 84 | Temp 97.9°F | Resp 16 | Ht 68.0 in | Wt 185.0 lb

## 2017-09-01 DIAGNOSIS — F411 Generalized anxiety disorder: Secondary | ICD-10-CM | POA: Diagnosis not present

## 2017-09-01 DIAGNOSIS — G4733 Obstructive sleep apnea (adult) (pediatric): Secondary | ICD-10-CM | POA: Diagnosis not present

## 2017-09-01 DIAGNOSIS — B258 Other cytomegaloviral diseases: Secondary | ICD-10-CM | POA: Diagnosis not present

## 2017-09-01 DIAGNOSIS — D571 Sickle-cell disease without crisis: Secondary | ICD-10-CM | POA: Diagnosis not present

## 2017-09-01 DIAGNOSIS — N529 Male erectile dysfunction, unspecified: Secondary | ICD-10-CM

## 2017-09-01 DIAGNOSIS — F331 Major depressive disorder, recurrent, moderate: Secondary | ICD-10-CM

## 2017-09-01 DIAGNOSIS — Z23 Encounter for immunization: Secondary | ICD-10-CM | POA: Diagnosis not present

## 2017-09-01 LAB — POCT URINALYSIS DIP (DEVICE)
Glucose, UA: NEGATIVE mg/dL
Ketones, ur: NEGATIVE mg/dL
Leukocytes, UA: NEGATIVE
Nitrite: NEGATIVE
Protein, ur: >=300 mg/dL — AB
Specific Gravity, Urine: 1.025 (ref 1.005–1.030)
Urobilinogen, UA: 2 mg/dL — ABNORMAL HIGH (ref 0.0–1.0)
pH: 6 (ref 5.0–8.0)

## 2017-09-01 MED ORDER — CITALOPRAM HYDROBROMIDE 40 MG PO TABS: 40.0000 mg | ORAL_TABLET | Freq: Every day | ORAL | 3 refills | Status: DC

## 2017-09-01 MED ORDER — BUPROPION HCL ER (XL) 150 MG PO TB24: 150.0000 mg | ORAL_TABLET | Freq: Every day | ORAL | 2 refills | Status: DC

## 2017-09-01 MED ORDER — BUSPIRONE HCL 10 MG PO TABS: 10.0000 mg | ORAL_TABLET | Freq: Three times a day (TID) | ORAL | 1 refills | Status: DC

## 2017-09-01 MED ORDER — FOLIC ACID 1 MG PO TABS: 1.0000 mg | ORAL_TABLET | Freq: Every day | ORAL | 3 refills | Status: DC

## 2017-09-01 MED ORDER — GABAPENTIN 300 MG PO CAPS: 300.0000 mg | ORAL_CAPSULE | Freq: Three times a day (TID) | ORAL | 3 refills | Status: DC

## 2017-09-01 MED ORDER — HYDROXYUREA 500 MG PO CAPS: 500.0000 mg | ORAL_CAPSULE | Freq: Every day | ORAL | 2 refills | Status: DC

## 2017-09-01 MED ORDER — IBUPROFEN 600 MG PO TABS: 600.0000 mg | ORAL_TABLET | Freq: Three times a day (TID) | ORAL | 2 refills | Status: DC | PRN

## 2017-09-01 NOTE — Telephone Encounter (Signed)
Please contact patient's pharmacy apparently he is unable to pick up prescriptions. Received a call from [facility] at the [facility] notifying me of this. Please handle at your earliest convenience.  [place] Jeffrey Morrison. Kenton Kingfisher, MSN, FNP-C [facility]  [address] [place], [place] [address] [phone]

## 2017-09-01 NOTE — Progress Notes (Signed)
Jeffrey Morrison, is a 21 y.o. male  WFU:[id number]  KGU:[id number]  DOB - 1996/07/09  CC:  Chief Complaint  Patient presents with  . Establish Care     HPI: Jeffrey Morrison is a 21 y.o. male is here today establish care. Medical problems significant for Hb-SS disease, current smoker, substance abuse (crack-cocaine) + marijuana, high risk lifestyle (repetitive incarceration), chronic priapism, OSA w/hx of CPAP use and cytomegalovirus. Jeffrey Morrison was hospitalized 08/24/2017 with a complaint of generalized weakness, syncopal episodes, and emesis. During admission he reported recently attempting to detox from crack-cocaine "cold [place]" and had felt poorly after one week of cessation. During the course of his hospitalization he was found to be infected with cytomegalovirus (confirmed IgM) and was treated symptomatically. Hospital course was uncomplicated and he was released once symptoms resolved.  Jeffrey Morrison reports a history of multiple incarcerations stemming from chronic drug addiction. He was only recently released from jail. He has not had a PCP for sometime, however while incarcerated he was followed by [facility] and wishes to resume hematological care there. He was last seen at [facility] in February 2018. Jeffrey Morrison suffers from depression and anxiety. He has been off medications since being released from prison in earlier this year. Reports that he continues to struggle with depression and anxiety and felt prior medication regimen were effective in management of symptoms. Jeffrey Morrison was previously prescribed Buspar and Celexa and requests to restart medications. No prior behavioral health counseling per patient. He denies currently experincing suicidal ideations, auditory hallucinations, or thoughts of harming others.  He admits to only marijuana use for over the last 2 weeks.  He is tolerating food and beverages well.  Denies any active sickle cell type pain.  He is currently not treated with any pain  medications.  Jeffrey Morrison reports an additional complaint of erectile dysfunction.  He reports a long history of frequent priapisms. At present he is being treated and followed by [name] with [facility] urology group, in review of his last office note, he has discussed options for treatment with patient including medication and prothesis options. [name] denies any chest pain, abdominal pain, nausea, cough, chest pain or shortness of breath.  No Known Allergies Past Medical History:  Diagnosis Date  . Chronic lower back pain   . Sickle cell crisis [facility])    Current Outpatient Medications on File Prior to Visit  Medication Sig Dispense Refill  . folic acid (FOLVITE) 1 MG tablet Take 1 tablet (1 mg total) by mouth daily. (Patient not taking: Reported on 09/01/2017) 30 tablet 3  . ibuprofen (ADVIL,MOTRIN) 400 MG tablet Take 1 tablet (400 mg total) by mouth every 6 (six) hours as needed for headache. (Patient not taking: Reported on 09/01/2017) 30 tablet 0  . [DISCONTINUED] diphenhydrAMINE (BENADRYL) 25 mg capsule Take 1 capsule (25 mg total) by mouth every 6 (six) hours as needed for itching (also available over the counter). (Patient not taking: Reported on 01/04/2015) 30 capsule 1   No current facility-administered medications on file prior to visit.    Family History  Problem Relation Age of Onset  . Diabetes Mother    Social History   Socioeconomic History  . Marital status: Single    Spouse name: Not on file  . Number of children: Not on file  . Years of education: Not on file  . Highest education level: Not on file  Social Needs  . Financial resource strain: Not on file  . Food insecurity - worry: Not  on file  . Food insecurity - inability: Not on file  . Transportation needs - medical: Not on file  . Transportation needs - non-medical: Not on file  Occupational History  . Not on file  Tobacco Use  . Smoking status: Current Every Day Smoker    Packs/day: 0.50    Types:  Cigarettes  . Smokeless tobacco: Never Used  Substance and Sexual Activity  . Alcohol use: No  . Drug use: No  . Sexual activity: Not on file  Other Topics Concern  . Not on file  Social History Narrative  . Not on file    Review of Systems: Constitutional: Negative for fever, chills, diaphoresis, activity change, appetite change and fatigue. HENT: Negative for ear pain, nosebleeds, congestion, facial swelling, rhinorrhea, neck pain, neck stiffness and ear discharge.  Eyes: Negative for pain, discharge, redness, itching and visual disturbance. Respiratory: Negative for cough, choking, chest tightness, shortness of breath, wheezing and stridor.  Cardiovascular: Negative for chest pain, palpitations and leg swelling. Gastrointestinal: Negative for abdominal distention. Genitourinary: Negative for dysuria, urgency, frequency, hematuria, flank pain, decreased urine volume, difficulty urinating and dyspareunia.  Musculoskeletal: Negative for back pain, joint swelling, arthralgia and gait problem. Neurological: Negative for dizziness, tremors, seizures, syncope, facial asymmetry, speech difficulty, weakness, light-headedness, numbness and headaches.  Hematological: Negative for adenopathy. Does not bruise/bleed easily. Psychiatric/Behavioral: Negative for hallucinations, behavioral problems, confusion, dysphoric mood, decreased concentration and agitation.    Objective:   Vitals:   09/01/17 0918  BP: 116/72  Pulse: 84  Resp: 16  Temp: 97.9 F (36.6 C)  SpO2: 100%    Physical Exam: Constitutional: Patient appears well-developed and well-nourished. No distress. HENT: Normocephalic, atraumatic, External right and left ear normal. Oropharynx is clear and moist.  Eyes: Conjunctivae and EOM are normal. PERRLA, no scleral icterus. Neck: Normal ROM. Neck supple. No JVD. No tracheal deviation. No thyromegaly. CVS: RRR, S1/S2 +, no murmurs, no gallops, no carotid bruit.  Pulmonary: Effort  and breath sounds normal, no stridor, rhonchi, wheezes, rales.  Abdominal: Soft. BS +, no distension, tenderness, rebound or guarding.  Musculoskeletal: Normal range of motion. No edema and no tenderness.  Lymphadenopathy: No lymphadenopathy noted, cervical, inguinal or axillary Neuro: Alert. Normal reflexes, muscle tone coordination. No cranial nerve deficit. Skin: Skin is warm and dry. No rash noted. Not diaphoretic. No erythema. No pallor. Psychiatric: Normal mood and affect. Behavior, judgment, thought content normal.  Lab Results  Component Value Date   WBC 26.9 (H) 08/26/2017   HGB 7.1 (L) 08/26/2017   HCT 20.4 (L) 08/26/2017   MCV 92.3 08/26/2017   PLT 510 (H) 08/26/2017   Lab Results  Component Value Date   CREATININE 0.91 08/26/2017   BUN <5 (L) 08/26/2017   NA 139 08/26/2017   K 3.9 08/26/2017   CL 108 08/26/2017   CO2 25 08/26/2017    No results found for: HGBA1C Lipid Panel  No results found for: CHOL, TRIG, HDL, CHOLHDL, VLDL, LDLCALC      Assessment and plan:  1. Hb-SS disease without crisis ([place]) Resuming  Hydrea today. We discussed the need for good hydration, monitoring of hydration status, avoidance of heat, cold, stress, and infection triggers. We discussed the risks and benefits of Hydrea, including bone marrow suppression, the possibility of GI upset, skin ulcers, hair thinning, and teratogenicity. The patient was reminded of the need to seek medical attention for any symptoms of bleeding, anemia, or infection. Continue folic acid 1 mg daily to prevent aplastic  bone marrow crises.   Pulmonary evaluation - Patient denies severe recurrent wheezes, shortness of breath with exercise, or persistent cough. If these symptoms develop, pulmonary function tests with spirometry will be ordered, and if abnormal, plan on referral to Pulmonology for further evaluation.  Eye - High risk of proliferative retinopathy. Annual eye exam with retinal exam recommended to patient,  the patient has had eye exam this year.  Immunization status - Yearly influenza vaccination is recommended, as well as being up to date with Meningococcal and Pneumococcal vaccines. Vaccinations up to date.   Hematologist-Referring to [facility]  Social support: referring patient to sickle agency for case management.  Immunization History  Administered Date(s) Administered  . Influenza,inj,Quad PF,6+ Mos 11/15/2014, 07/07/2015, 09/01/2017  . Pneumococcal Conjugate-13 09/01/2017  . Pneumococcal Polysaccharide-23 01/09/2014  . Tdap 09/01/2017    2. Need for immunization against influenza- Flu Vaccine QUAD 36+ mos IM  3. Need for Tdap vaccination- Tdap vaccine greater than or equal to 7yo IM  4. Need for vaccination against Streptococcus pneumoniae using pneumococcal conjugate vaccine 13 - Pneumococcal conjugate vaccine 13-valent IM  5. Generalized anxiety disorder , chronic, currently untreated and active. Will restart Buspirone to improve symptoms of anxiety.   6. 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 Will restart Buspirone to improve symptoms of anxiety.   7. Erectile dysfunction, unspecified erectile dysfunction type, new problem. Previously seen by  [name] with [facility] urology group. Will place a new referral for Dr. Amalia Hailey to continue management of ED. Checking a testosterone level today, however less likely that low T would be the source of symptoms given patient's history of chronic priapisms.  8. 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 Elevated IgM level indicating an acute infection. Patient has a history of multiple incarcerations, which is likely source of infection.  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 Will repeat titers today and refer patient to ID.     9. OSA (obstructive sleep apnea), chronic, untreated. Patient has a  history of sleep apnea requiring CPAP. No recent evaluation of condition. Will order a split night sleep study.   Orders Placed This Encounter  Procedures  . Flu Vaccine QUAD 36+ mos IM  . Tdap vaccine greater than or equal to 7yo IM  . Pneumococcal conjugate vaccine 13-valent IM  . Microalbumin/Creatinine Ratio, Urine  . CMV abs, IgG+IgM (cytomegalovirus)  . EBV VCA/EA Ab, IgG  . CBC with Differential  . Comprehensive metabolic panel  . Reticulocytes  . Testosterone  . Ambulatory referral to Urology  . Ambulatory referral to Hematology  . Ambulatory referral to Infectious Disease  . POCT urinalysis dip (device)  . Split night study   Meds ordered this encounter  Medications  . DISCONTD: ibuprofen (ADVIL,MOTRIN) 600 MG tablet    Sig: Take 1 tablet (600 mg total) by mouth every 8 (eight) hours as needed (sickle aching cell pain and inflammation).    Dispense:  90 tablet    Refill:  2    Order Specific Question:   Supervising Provider    Answer:   [name] [id number]  . DISCONTD: folic acid (FOLVITE) 1 MG tablet    Sig: Take 1 tablet (1 mg total) by mouth daily.    Dispense:  30 tablet    Refill:  3    Order Specific Question:   Supervising Provider    Answer:   Angelica Chessman  E [[id number]]  . DISCONTD: hydroxyurea (HYDREA) 500 MG capsule    Sig: Take 1 capsule (500 mg total) by mouth daily. May take with food to minimize GI side effects.    Dispense:  90 capsule    Refill:  2    Order Specific Question:   Supervising Provider    Answer:   [name] [id number]  . DISCONTD: gabapentin (NEURONTIN) 300 MG capsule    Sig: Take 1 capsule (300 mg total) by mouth 3 (three) times daily.    Dispense:  90 capsule    Refill:  3    Order Specific Question:   Supervising Provider    Answer:   [name] [id number]  . DISCONTD: busPIRone (BUSPAR) 10 MG tablet    Sig: Take 1 tablet (10 mg total) by mouth 3 (three) times daily.    Dispense:  90 tablet     Refill:  1    Order Specific Question:   Supervising Provider    Answer:   [name] [id number]  . DISCONTD: citalopram (CELEXA) 40 MG tablet    Sig: Take 1 tablet (40 mg total) by mouth daily.    Dispense:  30 tablet    Refill:  3    Order Specific Question:   Supervising Provider    Answer:   [name] [id number]  . DISCONTD: folic acid (FOLVITE) 1 MG tablet    Sig: Take 1 tablet (1 mg total) by mouth daily.    Dispense:  30 tablet    Refill:  3    Order Specific Question:   Supervising Provider    Answer:   [name] [[id number]]    [facility] Kenton Kingfisher, MSN, FNP-C [facility]  [address] [place], [place] [address] [phone]   Return in about 1 month (around 10/02/2017), or Chronic conditions.    The patient was given clear instructions to go to ER or return to medical center if symptoms don't improve, worsen or new problems develop. The patient verbalized understanding. The patient was told to call to get lab results if they haven't heard anything in the next week.     This note has been created with Surveyor, quantity. Any transcriptional errors are unintentional.

## 2017-09-01 NOTE — Telephone Encounter (Signed)
Contact patient to advise I am discontinuing Buspirone and prescribing Wellbutrin for control of anxiety symptoms. This medication is being sent to his pharmacy.  Carroll Sage. Kenton Kingfisher, MSN, FNP-C [facility]  [address] [place], [place] [address] [phone]

## 2017-09-01 NOTE — Telephone Encounter (Signed)
Per [facility] the Buspar is on back order and has been for the last 2 months. Would like to know if something else can be sent in.

## 2017-09-02 ENCOUNTER — Other Ambulatory Visit: Payer: Self-pay | Admitting: Family Medicine

## 2017-09-02 LAB — MICROALBUMIN / CREATININE URINE RATIO
CREATININE, UR: 112.6 mg/dL
Microalb/Creat Ratio: 871.6 mg/g creat — ABNORMAL HIGH (ref 0.0–30.0)
Microalbumin, Urine: 981.4 ug/mL

## 2017-09-02 LAB — TESTOSTERONE: TESTOSTERONE: 679 ng/dL (ref 264–916)

## 2017-09-02 MED ORDER — GABAPENTIN 300 MG PO CAPS: 300.0000 mg | ORAL_CAPSULE | Freq: Three times a day (TID) | ORAL | 3 refills | Status: DC

## 2017-09-02 MED ORDER — BUPROPION HCL ER (XL) 150 MG PO TB24: 150.0000 mg | ORAL_TABLET | Freq: Every day | ORAL | 2 refills | Status: DC

## 2017-09-02 MED ORDER — IBUPROFEN 600 MG PO TABS: 600.0000 mg | ORAL_TABLET | Freq: Three times a day (TID) | ORAL | 2 refills | Status: DC | PRN

## 2017-09-02 MED ORDER — FOLIC ACID 1 MG PO TABS
1.0000 mg | ORAL_TABLET | Freq: Every day | ORAL | 3 refills | Status: DC
Start: 2017-09-02 — End: 2018-04-26

## 2017-09-02 MED ORDER — CITALOPRAM HYDROBROMIDE 40 MG PO TABS: 40.0000 mg | ORAL_TABLET | Freq: Every day | ORAL | 3 refills | Status: DC

## 2017-09-02 MED ORDER — HYDROXYUREA 500 MG PO CAPS: 500.0000 mg | ORAL_CAPSULE | Freq: Every day | ORAL | 2 refills | Status: DC

## 2017-09-02 NOTE — Telephone Encounter (Signed)
Left a vm for patient to callback 

## 2017-09-02 NOTE — Telephone Encounter (Signed)
Per patient care giver he was unable to get medication because of the cost. Provider will send medication to [facility] and wellness to get medication cheaper.

## 2017-09-02 NOTE — Progress Notes (Signed)
Refilled all medications and routed to [facility] in order for patient to afford medications as his insurance is not in effect as of yet.   Carroll Sage. Kenton Kingfisher, MSN, FNP-C [facility]  [address] [place], [place] [address] [phone]

## 2017-09-03 LAB — CBC WITH DIFFERENTIAL/PLATELET
BASOS ABS: 0 10*3/uL (ref 0.0–0.2)
Basos: 0 %
EOS (ABSOLUTE): 0.4 10*3/uL (ref 0.0–0.4)
EOS: 2 %
HEMATOCRIT: 23.5 % — AB (ref 37.5–51.0)
HEMOGLOBIN: 7.6 g/dL — AB (ref 13.0–17.7)
Lymphocytes Absolute: 8.5 10*3/uL — ABNORMAL HIGH (ref 0.7–3.1)
Lymphs: 47 %
MCH: 31.4 pg (ref 26.6–33.0)
MCHC: 32.3 g/dL (ref 31.5–35.7)
MCV: 97 fL (ref 79–97)
MONOCYTES: 14 %
Monocytes Absolute: 2.5 10*3/uL — ABNORMAL HIGH (ref 0.1–0.9)
NEUTROS PCT: 37 %
NRBC: 8 % — AB (ref 0–0)
Neutrophils Absolute: 6.7 10*3/uL (ref 1.4–7.0)
Platelets: 628 10*3/uL — ABNORMAL HIGH (ref 150–379)
RBC: 2.42 x10E6/uL — AB (ref 4.14–5.80)
RDW: 21.5 % — AB (ref 12.3–15.4)
WBC: 18 10*3/uL — AB (ref 3.4–10.8)

## 2017-09-03 LAB — CMV ABS, IGG+IGM (CYTOMEGALOVIRUS)
CMV Ab - IgG: >10 U/mL — ABNORMAL HIGH (ref 0.00–0.59)
CMV IgM Ser EIA-aCnc: 196 AU/mL — ABNORMAL HIGH (ref 0.0–29.9)

## 2017-09-03 LAB — COMPREHENSIVE METABOLIC PANEL
ALK PHOS: 99 IU/L (ref 39–117)
ALT: 85 IU/L — ABNORMAL HIGH (ref 0–44)
AST: 65 IU/L — AB (ref 0–40)
Albumin/Globulin Ratio: 1.1 — ABNORMAL LOW (ref 1.2–2.2)
Albumin: 3.4 g/dL — ABNORMAL LOW (ref 3.5–5.5)
BUN/Creatinine Ratio: 5 — ABNORMAL LOW (ref 9–20)
BUN: 4 mg/dL — AB (ref 6–20)
Bilirubin Total: 1.6 mg/dL — ABNORMAL HIGH (ref 0.0–1.2)
CALCIUM: 8.4 mg/dL — AB (ref 8.7–10.2)
CO2: 22 mmol/L (ref 20–29)
CREATININE: 0.83 mg/dL (ref 0.76–1.27)
Chloride: 107 mmol/L — ABNORMAL HIGH (ref 96–106)
GFR calc Af Amer: 143 mL/min/{1.73_m2} (ref >59)
GFR, EST NON AFRICAN AMERICAN: 124 mL/min/{1.73_m2} (ref >59)
GLOBULIN, TOTAL: 3.2 g/dL (ref 1.5–4.5)
GLUCOSE: 86 mg/dL (ref 65–99)
Potassium: 4 mmol/L (ref 3.5–5.2)
SODIUM: 143 mmol/L (ref 134–144)
Total Protein: 6.6 g/dL (ref 6.0–8.5)

## 2017-09-03 LAB — EBV VCA/EA AB, IGG: EBV Early Antigen Ab, IgG: <9 U/mL (ref 0.0–8.9)

## 2017-09-03 LAB — RETICULOCYTES: RETIC CT PCT: 15.1 % — AB (ref 0.6–2.6)

## 2017-09-04 ENCOUNTER — Telehealth: Payer: Self-pay | Admitting: Family Medicine

## 2017-09-04 NOTE — Telephone Encounter (Signed)
Please contact patient to advise I did receive his lab results regarding cytomegaly virus am referring him to infectious disease for further evaluation and management.  Also in reviewing his previous history he does have a history of sleep apnea and I have also placed an order for him to receive a split sleep study and that will be performed here at [facility].  Advised him that he will be contacted to have that appointment scheduled.   Carroll Sage. Kenton Kingfisher, MSN, FNP-C [facility]  [address] [place], [place] [address] [phone]

## 2017-09-04 NOTE — Telephone Encounter (Signed)
Patient notified

## 2017-09-09 ENCOUNTER — Other Ambulatory Visit: Payer: Self-pay | Admitting: Family Medicine

## 2017-09-09 DIAGNOSIS — R809 Proteinuria, unspecified: Secondary | ICD-10-CM

## 2017-09-09 NOTE — Progress Notes (Unsigned)
Referring patient to [facility] for evaluation of recent gross proteinuria and clinical microalbuminuria. Notify patient of referral due to gross protein found in urine.  Carroll Sage. Kenton Kingfisher, MSN, FNP-C [facility] [place] [facility]  [address] [place], [place] [address] [phone]

## 2017-09-09 NOTE — Progress Notes (Signed)
Patient notified and will await a call from [facility]

## 2017-09-22 ENCOUNTER — Ambulatory Visit: Payer: Self-pay | Admitting: Infectious Diseases

## 2017-10-02 ENCOUNTER — Ambulatory Visit (INDEPENDENT_AMBULATORY_CARE_PROVIDER_SITE_OTHER): Payer: Medicare Other | Admitting: Family Medicine

## 2017-10-02 ENCOUNTER — Encounter: Payer: Self-pay | Admitting: Family Medicine

## 2017-10-02 VITALS — BP 124/60 | HR 64 | Temp 98.2°F | Resp 14 | Ht 68.0 in | Wt 179.0 lb

## 2017-10-02 DIAGNOSIS — F99 Mental disorder, not otherwise specified: Secondary | ICD-10-CM | POA: Diagnosis not present

## 2017-10-02 DIAGNOSIS — N529 Male erectile dysfunction, unspecified: Secondary | ICD-10-CM | POA: Diagnosis not present

## 2017-10-02 DIAGNOSIS — D571 Sickle-cell disease without crisis: Secondary | ICD-10-CM | POA: Diagnosis not present

## 2017-10-02 DIAGNOSIS — B259 Cytomegaloviral disease, unspecified: Secondary | ICD-10-CM

## 2017-10-02 DIAGNOSIS — G4733 Obstructive sleep apnea (adult) (pediatric): Secondary | ICD-10-CM

## 2017-10-02 LAB — POCT URINALYSIS DIP (DEVICE)
BILIRUBIN URINE: NEGATIVE
Glucose, UA: NEGATIVE mg/dL
KETONES UR: NEGATIVE mg/dL
Nitrite: NEGATIVE
Protein, ur: >=300 mg/dL — AB
SPECIFIC GRAVITY, URINE: 1.015 (ref 1.005–1.030)
UROBILINOGEN UA: 1 mg/dL (ref 0.0–1.0)
pH: 5.5 (ref 5.0–8.0)

## 2017-10-02 NOTE — Patient Instructions (Addendum)
Reschedule Infectious disease appointment-Phone: [phone]   Reschedule appointment with [facility] [phone]   Please contact [facility] to arrange transportation for appointments.     Sickle Cell Anemia, Adult Sickle cell anemia is a condition where your red blood cells are shaped like sickles. Red blood cells carry oxygen through the body. Sickle-shaped red blood cells do not live as long as normal red blood cells. They also clump together and block blood from flowing through the blood vessels. These things prevent the body from getting enough oxygen. Sickle cell anemia causes organ damage and pain. It also increases the risk of infection. Follow these instructions at home:  Drink enough fluid to keep your pee (urine) clear or pale yellow. Drink more in hot weather and during exercise.  Do not smoke. Smoking lowers oxygen levels in the blood.  Only take over-the-counter or prescription medicines as told by your doctor.  Take antibiotic medicines as told by your doctor. Make sure you finish them even if you start to feel better.  Take supplements as told by your doctor.  Consider wearing a medical alert bracelet. This tells anyone caring for you in an emergency of your condition.  When traveling, keep your medical information, doctors' names, and the medicines you take with you at all times.  If you have a fever, do not take fever medicines right away. This could cover up a problem. Tell your doctor.  Keep all follow-up visits with your doctor. Sickle cell anemia requires regular medical care. Contact a doctor if: You have a fever. Get help right away if:  You feel dizzy or faint.  You have new belly (abdominal) pain, especially on the left side near the stomach area.  You have a lasting, often uncomfortable and painful erection of the penis (priapism). If it is not treated right away, you will become unable to have sex (impotence).  You have  numbness in your arms or legs or you have a hard time moving them.  You have a hard time talking.  You have a fever or lasting symptoms for more than 2-3 days.  You have a fever and your symptoms suddenly get worse.  You have signs or symptoms of infection. These include: ? Chills. ? Being more tired than normal (lethargy). ? Irritability. ? Poor eating. ? Throwing up (vomiting).  You have pain that is not helped with medicine.  You have shortness of breath.  You have pain in your chest.  You are coughing up pus-like or bloody mucus.  You have a stiff neck.  Your feet or hands swell or have pain.  Your belly looks bloated.  Your joints hurt. This information is not intended to replace advice given to you by your health care provider. Make sure you discuss any questions you have with your health care provider. Document Released: 06/22/2013 Document Revised: 02/07/2016 Document Reviewed: 04/13/2013 Elsevier Interactive Patient Education  2017 [employer].

## 2017-10-02 NOTE — Progress Notes (Signed)
Patient ID: Jeffrey Morrison, male    DOB: [birth year], 22 y.o.   MRN: [id number]  PCP: Scot Jun, FNP  Chief Complaint  Patient presents with  . Follow-up    1 month on sickle cell    Subjective:  HPI Jeffrey Morrison is a 22 y.o. 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 Jeffrey Morrison reports that he is still unable to afford his medications therefore hadn't taken any medications.  Reports insurance in effective however he can't afford co-pays for medications. He continues to reside with his employer. He is still using marijuana and smoking cigarettes, although denies use of any crack cocaine. He has not scheduled follow-up visits with infectious disease, hematology, or sleep medicine. Reports he has scheduled a urology appointment with [facility] to evaluate persistent erectile dysfunction. He was referred to nephrology as well to evaluate microalbuminuria and is uncertain whether or not he has scheduled an appointment. Today he reports mild pain to his buttocks which he is managing with NSAIDS. He denies shortness of breath, chest pain, headaches, dizziness, homicidal or suicidal ideations, or auditory hallucinations.  Social History   Socioeconomic History  . Marital status: Single    Spouse name: Not on file  . Number of children: Not on file  . Years of education: Not on file  . Highest education level: Not on file  Social Needs  . Financial resource strain: Not on file  . Food insecurity - worry: Not on file  . Food insecurity - inability: Not on file  . Transportation needs - medical: Not on file  . Transportation needs - non-medical: Not on file  Occupational History  . Not on file  Tobacco Use  . Smoking status: Current Every Day Smoker    Packs/day: 0.50    Types: Cigarettes  . Smokeless  tobacco: Never Used  Substance and Sexual Activity  . Alcohol use: No  . Drug use: No  . Sexual activity: Not on file  Other Topics Concern  . Not on file  Social History Narrative  . Not on file    Family History  Problem Relation Age of Onset  . Diabetes Mother      Review of Systems  Patient Active Problem List   Diagnosis Date Noted  . CMV (cytomegalovirus) ([place]) 08/27/2017  . Erectile dysfunction 08/27/2017  . Depression 08/25/2017  . OSA (obstructive sleep apnea)   . Anemia of chronic disease     No Known Allergies  Prior to Admission medications   Medication Sig Start Date End Date Taking? Authorizing Provider  buPROPion (WELLBUTRIN XL) 150 MG 24 hr tablet Take 1 tablet (150 mg total) by mouth daily. Patient not taking: Reported on 10/02/2017 09/02/17   Scot Jun, FNP  citalopram (CELEXA) 40 MG tablet Take 1 tablet (40 mg total) by mouth daily. Patient not taking: Reported on 10/02/2017 09/02/17   Scot Jun, FNP  folic acid (FOLVITE) 1 MG tablet Take 1 tablet (1 mg total) by mouth daily. Patient not taking: Reported on 10/02/2017 09/02/17   Scot Jun, FNP  gabapentin (NEURONTIN) 300 MG capsule Take 1 capsule (300 mg total) by mouth 3 (three) times daily. Patient not taking: Reported on 10/02/2017 09/02/17   Scot Jun, FNP  hydroxyurea (HYDREA) 500 MG capsule Take 1 capsule (500 mg total) by mouth daily.  May take with food to minimize GI side effects. Patient not taking: Reported on 10/02/2017 09/02/17   Scot Jun, FNP  ibuprofen (ADVIL,MOTRIN) 600 MG tablet Take 1 tablet (600 mg total) by mouth every 8 (eight) hours as needed (sickle aching cell pain and inflammation). Patient not taking: Reported on 10/02/2017 09/02/17   Scot Jun, FNP    Past Medical, Surgical Family and Social History reviewed and updated.    Objective:   Today's Vitals   10/02/17 1056  BP: 124/60  Pulse: 64  Resp: 14  Temp: 98.2 F  (36.8 C)  TempSrc: Oral  SpO2: 96%  Weight: 179 lb (81.2 kg)  Height: 5\' 8"  (1.727 m)    Wt Readings from Last 3 Encounters:  10/02/17 179 lb (81.2 kg)  09/01/17 185 lb (83.9 kg)  08/24/17 194 lb 7.1 oz (88.2 kg)    Physical Exam Constitutional: Patient appears well-developed and well-nourished. No distress. HENT: Normocephalic, atraumatic, External right and left ear normal. Oropharynx is clear and moist. Poor dentition. Eyes: Conjunctivae and EOM are normal. PERRLA, no scleral icterus. Neck: Normal ROM. Neck supple. No JVD. No tracheal deviation. No thyromegaly. CVS: RRR, S1/S2 +, no murmurs, no gallops, no carotid bruit.  Pulmonary: Effort and breath sounds normal, no stridor, rhonchi, wheezes, rales.  Abdominal: Soft. BS +, no distension, tenderness, rebound or guarding.  Musculoskeletal: Normal range of motion. No edema and no tenderness.  Lymphadenopathy: No lymphadenopathy noted, cervical, inguinal or axillary Neuro: Alert. Normal reflexes, muscle tone coordination. No cranial nerve deficit. Skin: Skin is warm and dry. No rash noted. Not diaphoretic. No erythema. No pallor. Psychiatric: Normal mood and affect. Behavior, judgment, thought content normal.   Assessment & Plan:  1. Hb-SS disease without crisis ([place]), stable. Encouraged resuming  Hydrea today. We discussed the need for good hydration, monitoring of hydration status, avoidance of heat, cold, stress, and infection triggers. We discussed the risks and benefits of Hydrea, including bone marrow suppression, the possibility of GI upset, skin ulcers, hair thinning, and teratogenicity. The patient was reminded of the need to seek medical attention forany symptoms of bleeding, anemia, or infection. Continue folic acid 1 mg daily to prevent aplastic bone marrow crises. Encouraged to work with the sickle cell agency to assist with resources to obtain medications. Checking CBC, Reticulocyte count, and CMP  2. Cytomegalovirus  infection, unspecified cytomegaloviral infection type [facility]), recommend follow-up with infectious disease medicine for evaluation of cytomegalovirus. Call their office to schedule appointment.   3. Erectile dysfunction, unspecified erectile dysfunction type, referred to [facility] for evaluation. Keep scheduled appointment.  4. OSA (obstructive sleep apnea), split sleep study previously ordered. Patient advised to follow-up with sleep medicine to reschedule appointment.  5. Mental health disorder, appear stable today. Suffers from a long history of major depression disorder and generalized anxiety disorder. He is high risk for substance abuse relapse due to untreated mental disorder. I encourage patient to follow-up with sickle cell agency for assistance with medications and mental health resources.   RTC: 6 weeks for chronic condition management    Carroll Sage. Kenton Kingfisher, MSN, FNP-C [facility]  [address] [place], [place] [address] [phone]

## 2017-10-03 LAB — COMPREHENSIVE METABOLIC PANEL
A/G RATIO: 1.5 (ref 1.2–2.2)
ALK PHOS: 72 IU/L (ref 39–117)
ALT: 29 IU/L (ref 0–44)
AST: 44 IU/L — ABNORMAL HIGH (ref 0–40)
Albumin: 4.3 g/dL (ref 3.5–5.5)
BUN/Creatinine Ratio: 11 (ref 9–20)
BUN: 9 mg/dL (ref 6–20)
Bilirubin Total: 3.1 mg/dL — ABNORMAL HIGH (ref 0.0–1.2)
CO2: 21 mmol/L (ref 20–29)
Calcium: 9.2 mg/dL (ref 8.7–10.2)
Chloride: 103 mmol/L (ref 96–106)
Creatinine, Ser: 0.82 mg/dL (ref 0.76–1.27)
GFR calc non Af Amer: 125 mL/min/{1.73_m2} (ref >59)
GFR, EST AFRICAN AMERICAN: 144 mL/min/{1.73_m2} (ref >59)
GLUCOSE: 90 mg/dL (ref 65–99)
Globulin, Total: 2.8 g/dL (ref 1.5–4.5)
POTASSIUM: 4.5 mmol/L (ref 3.5–5.2)
Sodium: 140 mmol/L (ref 134–144)
TOTAL PROTEIN: 7.1 g/dL (ref 6.0–8.5)

## 2017-10-03 LAB — CBC WITH DIFFERENTIAL/PLATELET
BASOS ABS: 0.1 10*3/uL (ref 0.0–0.2)
Basos: 0 %
EOS (ABSOLUTE): 0.2 10*3/uL (ref 0.0–0.4)
Eos: 1 %
Hematocrit: 22 % — ABNORMAL LOW (ref 37.5–51.0)
Hemoglobin: 7.5 g/dL — ABNORMAL LOW (ref 13.0–17.7)
IMMATURE GRANULOCYTES: 1 %
Immature Grans (Abs): 0.2 10*3/uL — ABNORMAL HIGH (ref 0.0–0.1)
LYMPHS ABS: 5.2 10*3/uL — AB (ref 0.7–3.1)
Lymphs: 33 %
MCH: 31.9 pg (ref 26.6–33.0)
MCHC: 34.1 g/dL (ref 31.5–35.7)
MCV: 94 fL (ref 79–97)
MONOS ABS: 1.2 10*3/uL — AB (ref 0.1–0.9)
Monocytes: 8 %
NEUTROS PCT: 57 %
NRBC: 12 % — ABNORMAL HIGH (ref 0–0)
Neutrophils Absolute: 8.8 10*3/uL — ABNORMAL HIGH (ref 1.4–7.0)
PLATELETS: 511 10*3/uL — AB (ref 150–379)
RBC: 2.35 x10E6/uL — AB (ref 4.14–5.80)
RDW: 22.4 % — AB (ref 12.3–15.4)
WBC: 15.6 10*3/uL — AB (ref 3.4–10.8)

## 2017-10-03 LAB — RETICULOCYTES: RETIC CT PCT: 14.1 % — AB (ref 0.6–2.6)

## 2017-10-08 ENCOUNTER — Telehealth: Payer: Self-pay | Admitting: Family Medicine

## 2017-10-08 ENCOUNTER — Encounter (HOSPITAL_BASED_OUTPATIENT_CLINIC_OR_DEPARTMENT_OTHER): Payer: Self-pay

## 2017-10-08 NOTE — Telephone Encounter (Signed)
Please follow-up to see if prior referrals have been scheduled for patient.  He has an upcoming appt with infectious disease.   Carroll Sage. Kenton Kingfisher, MSN, FNP-C [facility]  [address] [place], [place] [address] [phone]

## 2017-10-15 NOTE — Telephone Encounter (Signed)
Patient has been scheduled at [facility] urology with Dr. Amalia Hailey for 12/08/2017 at 3:30pm. [facility] has reached out to patient and will try again to reach patient. Left a vm for patient to callback to let him know of appointments.

## 2017-10-19 ENCOUNTER — Ambulatory Visit: Payer: Self-pay | Admitting: Infectious Diseases

## 2017-10-19 NOTE — Progress Notes (Deleted)
    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 male with ***. He was hospitalized from 12/10 - 12/13 2018 after he experienced a week of dizziness, vomiting, palpitations and several syncopal episodes. Around the same time these symptoms started he stopped using crack cocaine "cold [place]" and attributed many of the symptoms to that withdrawal.   Patient Active Problem List   Diagnosis Date Noted  . CMV (cytomegalovirus) ([place]) 08/27/2017  . Erectile dysfunction 08/27/2017  . Depression 08/25/2017  . OSA (obstructive sleep apnea)   . Anemia of chronic disease     Patient's Medications  New Prescriptions   No medications on file  Previous Medications   BUPROPION (WELLBUTRIN XL) 150 MG 24 HR TABLET    Take 1 tablet (150 mg total) by mouth daily.   CITALOPRAM (CELEXA) 40 MG TABLET    Take 1 tablet (40 mg total) by mouth daily.   FOLIC ACID (FOLVITE) 1 MG TABLET    Take 1 tablet (1 mg total) by mouth daily.   GABAPENTIN (NEURONTIN) 300 MG CAPSULE    Take 1 capsule (300 mg total) by mouth 3 (three) times daily.   HYDROXYUREA (HYDREA) 500 MG CAPSULE    Take 1 capsule (500 mg total) by mouth daily. May take with food to minimize GI side effects.   IBUPROFEN (ADVIL,MOTRIN) 600 MG TABLET    Take 1 tablet (600 mg total) by mouth every 8 (eight) hours as needed (sickle aching cell pain and inflammation).  Modified Medications   No medications on file  Discontinued Medications   No medications on file    ROS    Past Medical History:  Diagnosis Date  . Chronic lower back pain   . Sickle cell crisis [facility])     Social History   Tobacco Use  . Smoking status: Current Every Day Smoker    Packs/day: 0.50    Types: Cigarettes  . Smokeless tobacco: Never Used  Substance Use Topics  . Alcohol use: No  .  Drug use: No    Family History  Problem Relation Age of Onset  . Diabetes Mother      No Known Allergies   OBJECTIVE: There were no vitals filed for this visit. There is no height or weight on file to calculate BMI.  Physical Exam   ASSESSMENT & PLAN:  Problem List Items Addressed This Visit    None      Janene Madeira, MSN, [facility] for [facility] [place] [facility]  10/19/2017  8:42 AM

## 2017-11-13 ENCOUNTER — Ambulatory Visit: Payer: Self-pay | Admitting: Family Medicine

## 2018-02-08 DIAGNOSIS — R918 Other nonspecific abnormal finding of lung field: Secondary | ICD-10-CM | POA: Diagnosis not present

## 2018-02-08 DIAGNOSIS — R05 Cough: Secondary | ICD-10-CM | POA: Diagnosis not present

## 2018-02-08 DIAGNOSIS — D5701 Hb-SS disease with acute chest syndrome: Secondary | ICD-10-CM | POA: Diagnosis not present

## 2018-02-08 DIAGNOSIS — F17210 Nicotine dependence, cigarettes, uncomplicated: Secondary | ICD-10-CM | POA: Diagnosis not present

## 2018-02-08 DIAGNOSIS — D5700 Hb-SS disease with crisis, unspecified: Secondary | ICD-10-CM | POA: Diagnosis not present

## 2018-02-08 DIAGNOSIS — M25561 Pain in right knee: Secondary | ICD-10-CM | POA: Diagnosis not present

## 2018-02-08 DIAGNOSIS — G4733 Obstructive sleep apnea (adult) (pediatric): Secondary | ICD-10-CM | POA: Diagnosis not present

## 2018-02-08 DIAGNOSIS — J189 Pneumonia, unspecified organism: Secondary | ICD-10-CM | POA: Diagnosis not present

## 2018-02-08 DIAGNOSIS — M25562 Pain in left knee: Secondary | ICD-10-CM | POA: Diagnosis not present

## 2018-02-08 DIAGNOSIS — D649 Anemia, unspecified: Secondary | ICD-10-CM | POA: Diagnosis not present

## 2018-02-08 DIAGNOSIS — Z79899 Other long term (current) drug therapy: Secondary | ICD-10-CM | POA: Diagnosis not present

## 2018-04-14 ENCOUNTER — Emergency Department (HOSPITAL_COMMUNITY)
Admission: EM | Admit: 2018-04-14 | Discharge: 2018-04-14 | Disposition: A | Discharge status: Home / Self Care | Payer: Medicare Other | Attending: Emergency Medicine | Admitting: Emergency Medicine

## 2018-04-14 ENCOUNTER — Other Ambulatory Visit: Payer: Self-pay

## 2018-04-14 ENCOUNTER — Encounter (HOSPITAL_COMMUNITY): Payer: Self-pay | Admitting: Emergency Medicine

## 2018-04-14 DIAGNOSIS — F17210 Nicotine dependence, cigarettes, uncomplicated: Secondary | ICD-10-CM | POA: Insufficient documentation

## 2018-04-14 DIAGNOSIS — L739 Follicular disorder, unspecified: Secondary | ICD-10-CM | POA: Diagnosis not present

## 2018-04-14 DIAGNOSIS — Z79899 Other long term (current) drug therapy: Secondary | ICD-10-CM | POA: Diagnosis not present

## 2018-04-14 DIAGNOSIS — M79669 Pain in unspecified lower leg: Secondary | ICD-10-CM | POA: Diagnosis present

## 2018-04-14 MED ORDER — DOXYCYCLINE HYCLATE 100 MG PO CAPS: 100.0000 mg | ORAL_CAPSULE | Freq: Two times a day (BID) | ORAL | 0 refills | Status: DC

## 2018-04-14 MED ORDER — ACETAMINOPHEN 325 MG PO TABS: 650.0000 mg | ORAL_TABLET | Freq: Four times a day (QID) | ORAL | 0 refills | Status: DC | PRN

## 2018-04-14 NOTE — ED Provider Notes (Signed)
[facility] EMERGENCY DEPARTMENT Provider Note   CSN: [id number] Arrival date & time: 04/14/18  1405     History   Chief Complaint Chief Complaint  Patient presents with  . Leg Pain    HPI Jeffrey Morrison is a 22 y.o. male.  HPI   22 year old male with a past medical history of sickle cell anemia presents today with complaints of leg pain.  Patient notes over the last several days he developed itchiness to his bilateral lower extremities along the lateral aspect.  Patient denies any known exposures and including soaps detergents lotions clothes, hot tubs/pool, organic materials.  Patient denies any fever chills nausea or vomiting.  He notes this is predominantly in the lower extremities with no upper extremity involvement.  He denies any history of the same.    Past Medical History:  Diagnosis Date  . Chronic lower back pain   . Sickle cell crisis [facility])     Patient Active Problem List   Diagnosis Date Noted  . CMV (cytomegalovirus) ([place]) 08/27/2017  . Erectile dysfunction 08/27/2017  . Depression 08/25/2017  . OSA (obstructive sleep apnea)   . Anemia of chronic disease     Past Surgical History:  Procedure Laterality Date  . SPLENECTOMY          Home Medications    Prior to Admission medications   Medication Sig Start Date End Date Taking? Authorizing Provider  hydroxyurea (HYDREA) 500 MG capsule Take 1 capsule (500 mg total) by mouth daily. May take with food to minimize GI side effects. 09/02/17  Yes Scot Jun, FNP  ibuprofen (ADVIL,MOTRIN) 200 MG tablet Take 200 mg by mouth every 6 (six) hours as needed for moderate pain.   Yes [provider]  acetaminophen (TYLENOL) 325 MG tablet Take 2 tablets (650 mg total) by mouth every 6 (six) hours as needed. 04/14/18   Shirline Kendle, Dellis Filbert, PA-C  buPROPion (WELLBUTRIN XL) 150 MG 24 hr tablet Take 1 tablet (150 mg total) by mouth daily. Patient not taking: Reported on 10/02/2017 09/02/17    Scot Jun, FNP  citalopram (CELEXA) 40 MG tablet Take 1 tablet (40 mg total) by mouth daily. Patient not taking: Reported on 10/02/2017 09/02/17   Scot Jun, FNP  doxycycline (VIBRAMYCIN) 100 MG capsule Take 1 capsule (100 mg total) by mouth 2 (two) times daily. 04/14/18   Tadashi Burkel, Dellis Filbert, PA-C  folic acid (FOLVITE) 1 MG tablet Take 1 tablet (1 mg total) by mouth daily. Patient not taking: Reported on 10/02/2017 09/02/17   Scot Jun, FNP  gabapentin (NEURONTIN) 300 MG capsule Take 1 capsule (300 mg total) by mouth 3 (three) times daily. Patient not taking: Reported on 10/02/2017 09/02/17   Scot Jun, FNP  ibuprofen (ADVIL,MOTRIN) 600 MG tablet Take 1 tablet (600 mg total) by mouth every 8 (eight) hours as needed (sickle aching cell pain and inflammation). Patient not taking: Reported on 10/02/2017 09/02/17   Scot Jun, FNP    Family History Family History  Problem Relation Age of Onset  . Diabetes Mother     Social History Social History   Tobacco Use  . Smoking status: Current Every Day Smoker    Packs/day: 0.50    Types: Cigarettes  . Smokeless tobacco: Never Used  Substance Use Topics  . Alcohol use: No  . Drug use: No     Allergies   Patient has no known allergies.   Review of Systems Review of Systems  All other systems reviewed and are negative.    Physical Exam Updated Vital Signs BP (!) 109/57 (BP Location: Right Arm)   Pulse (!) 54   Temp 98.1 F (36.7 C) (Oral)   Resp 16   SpO2 98%   Physical Exam  Constitutional: He is oriented to person, place, and time. He appears well-developed and well-nourished.  HENT:  Head: Normocephalic and atraumatic.  Eyes: Pupils are equal, round, and reactive to light. Conjunctivae are normal. Right eye exhibits no discharge. Left eye exhibits no discharge. No scleral icterus.  Neck: Normal range of motion. No JVD present. No tracheal deviation present.  Pulmonary/Chest: Effort  normal. No stridor.  Neurological: He is alert and oriented to person, place, and time. Coordination normal.  Skin:  Pustular rash noted to the bilateral lateral extremities in the photo  Psychiatric: He has a normal mood and affect. His behavior is normal. Judgment and thought content normal.  Nursing note and vitals reviewed.        ED Treatments / Results  Labs (all labs ordered are listed, but only abnormal results are displayed) Labs Reviewed - No data to display  EKG None  Radiology No results found.  Procedures Procedures (including critical care time)  Medications Ordered in ED Medications - No data to display   Initial Impression / Assessment and Plan / ED Course  I have reviewed the triage vital signs and the nursing notes.  Pertinent labs & imaging results that were available during my care of the patient were reviewed by me and considered in my medical decision making (see chart for details).     Labs:   Imaging:  Consults:  Therapeutics:  Discharge Meds: Doxycycline  Assessment/Plan: Patient presentation most consistent with folliculitis.  He was treated for this, follow-up with his primary care if symptoms persist return if they worsen.  He verbalized understanding and agreement to today's plan.     Final Clinical Impressions(s) / ED Diagnoses   Final diagnoses:  Folliculitis    ED Discharge Orders        Ordered    doxycycline (VIBRAMYCIN) 100 MG capsule  2 times daily,   Status:  Discontinued     04/14/18 1634    acetaminophen (TYLENOL) 325 MG tablet  Every 6 hours PRN     04/14/18 1634    doxycycline (VIBRAMYCIN) 100 MG capsule  2 times daily     04/14/18 1635       Laquanda Bick, Lurlean Horns 04/14/18 2143    Milton Ferguson, MD 04/17/18 [phone]

## 2018-04-14 NOTE — Discharge Instructions (Addendum)
Please read attached information. If you experience any new or worsening signs or symptoms please return to the emergency room for evaluation. Please follow-up with your primary care provider or specialist as discussed. Please use medication prescribed only as directed and discontinue taking if you have any concerning signs or symptoms.   °

## 2018-04-14 NOTE — ED Triage Notes (Signed)
Patient complains of painful blisters on bilateral legs that appeared two days ago. Patient unsure of cause. Patient is alert, oriented, and ambulating independently with steady gait.

## 2018-04-14 NOTE — ED Provider Notes (Addendum)
Patient placed in Quick Look pathway, seen and evaluated   Chief Complaint: blisters to b/l legs and back x2 days  HPI:   Pt is a 22 y.o. male with a PMHx of sickle cell anemia and chronic back pain, presenting today with c/o painful itchy blisters to b/l legs and back/buttock x 2 days. Denies recent change in meds, soaps or detergents, animal/plant contact, or exposure to similar rashes. No drainage, warmth, redness, fevers/chills. Denies any other symptoms or sickle cell pain.   ROS: +Blister rash. No fevers, drainage, warmth, erythema.    Physical Exam:  BP 122/75 (BP Location: Right Arm)   Pulse 64   Temp 97.8 F (36.6 C) (Oral)   Resp 16   SpO2 97%    Gen: No distress  Neuro: Awake and Alert  Skin: blisters/pustules to b/l lower legs on the lateral aspects, minimal skin tautness but no significant swelling, no erythema or warmth, no drainage. No vesicles. Back without any blisters, very small maculopapular rash/lesion to the L upper back laterally. SEE PICTURES BELOW  LLE:   RLE:       Focused Exam: see above   Initiation of care has begun. The patient has been counseled on the process, plan, and necessity for staying for the completion/evaluation, and the remainder of the medical screening examination     [address], [place], [place] 04/14/18 1444    Julianne Rice, MD 04/17/18 [phone]

## 2018-04-21 ENCOUNTER — Encounter (HOSPITAL_COMMUNITY): Payer: Self-pay

## 2018-04-21 ENCOUNTER — Emergency Department (HOSPITAL_COMMUNITY): Payer: Medicare Other

## 2018-04-21 ENCOUNTER — Other Ambulatory Visit: Payer: Self-pay

## 2018-04-21 ENCOUNTER — Inpatient Hospital Stay (HOSPITAL_COMMUNITY)
Admission: EM | Admit: 2018-04-21 | Discharge: 2018-04-26 | DRG: 812 | Disposition: A | Discharge status: Home / Self Care | Payer: Medicare Other | Attending: Internal Medicine | Admitting: Internal Medicine

## 2018-04-21 DIAGNOSIS — G4733 Obstructive sleep apnea (adult) (pediatric): Secondary | ICD-10-CM | POA: Diagnosis present

## 2018-04-21 DIAGNOSIS — D5700 Hb-SS disease with crisis, unspecified: Secondary | ICD-10-CM | POA: Diagnosis not present

## 2018-04-21 DIAGNOSIS — F17210 Nicotine dependence, cigarettes, uncomplicated: Secondary | ICD-10-CM | POA: Diagnosis present

## 2018-04-21 DIAGNOSIS — I510 Cardiac septal defect, acquired: Secondary | ICD-10-CM | POA: Diagnosis present

## 2018-04-21 DIAGNOSIS — Z833 Family history of diabetes mellitus: Secondary | ICD-10-CM

## 2018-04-21 DIAGNOSIS — D638 Anemia in other chronic diseases classified elsewhere: Secondary | ICD-10-CM | POA: Diagnosis not present

## 2018-04-21 DIAGNOSIS — Z79899 Other long term (current) drug therapy: Secondary | ICD-10-CM | POA: Diagnosis not present

## 2018-04-21 DIAGNOSIS — M545 Low back pain: Secondary | ICD-10-CM | POA: Diagnosis present

## 2018-04-21 DIAGNOSIS — Z23 Encounter for immunization: Secondary | ICD-10-CM | POA: Diagnosis not present

## 2018-04-21 DIAGNOSIS — Z9081 Acquired absence of spleen: Secondary | ICD-10-CM | POA: Diagnosis not present

## 2018-04-21 DIAGNOSIS — G894 Chronic pain syndrome: Secondary | ICD-10-CM | POA: Diagnosis present

## 2018-04-21 DIAGNOSIS — F329 Major depressive disorder, single episode, unspecified: Secondary | ICD-10-CM | POA: Diagnosis present

## 2018-04-21 DIAGNOSIS — Z8619 Personal history of other infectious and parasitic diseases: Secondary | ICD-10-CM

## 2018-04-21 DIAGNOSIS — D72829 Elevated white blood cell count, unspecified: Secondary | ICD-10-CM | POA: Diagnosis present

## 2018-04-21 DIAGNOSIS — R0789 Other chest pain: Secondary | ICD-10-CM | POA: Diagnosis not present

## 2018-04-21 DIAGNOSIS — D57419 Sickle-cell thalassemia with crisis, unspecified: Secondary | ICD-10-CM | POA: Diagnosis not present

## 2018-04-21 LAB — CBC WITH DIFFERENTIAL/PLATELET
BAND NEUTROPHILS: 3 %
BASOS ABS: 0 10*3/uL (ref 0.0–0.1)
BASOS PCT: 0 %
BLASTS: 0 %
EOS ABS: 0 10*3/uL (ref 0.0–0.7)
Eosinophils Relative: 0 %
HCT: 22.7 % — ABNORMAL LOW (ref 39.0–52.0)
Hemoglobin: 7.4 g/dL — ABNORMAL LOW (ref 13.0–17.0)
Lymphocytes Relative: 34 %
Lymphs Abs: 5.8 10*3/uL — ABNORMAL HIGH (ref 0.7–4.0)
MCH: 31.6 pg (ref 26.0–34.0)
MCHC: 32.6 g/dL (ref 30.0–36.0)
MCV: 97 fL (ref 78.0–100.0)
METAMYELOCYTES PCT: 1 %
MONO ABS: 1.2 10*3/uL — AB (ref 0.1–1.0)
MONOS PCT: 7 %
Myelocytes: 2 %
NEUTROS ABS: 10.1 10*3/uL — AB (ref 1.7–7.7)
Neutrophils Relative %: 53 %
OTHER: 0 %
PLATELETS: 554 10*3/uL — AB (ref 150–400)
Promyelocytes Relative: 0 %
RBC: 2.34 MIL/uL — ABNORMAL LOW (ref 4.22–5.81)
RDW: 21.8 % — AB (ref 11.5–15.5)
SMEAR REVIEW: INCREASED
WBC: 17.1 10*3/uL — ABNORMAL HIGH (ref 4.0–10.5)
nRBC: 7 /100 WBC — ABNORMAL HIGH

## 2018-04-21 LAB — RETICULOCYTES
RBC.: 2.34 MIL/uL — ABNORMAL LOW (ref 4.22–5.81)
Retic Count, Absolute: 337 10*3/uL — ABNORMAL HIGH (ref 19.0–186.0)
Retic Ct Pct: 14.4 % — ABNORMAL HIGH (ref 0.4–3.1)

## 2018-04-21 LAB — RAPID URINE DRUG SCREEN, HOSP PERFORMED
Amphetamines: NOT DETECTED
Barbiturates: NOT DETECTED
Benzodiazepines: NOT DETECTED
Cocaine: NOT DETECTED
Opiates: NOT DETECTED
Tetrahydrocannabinol: POSITIVE — AB

## 2018-04-21 LAB — COMPREHENSIVE METABOLIC PANEL
ALBUMIN: 3.5 g/dL (ref 3.5–5.0)
ALK PHOS: 64 U/L (ref 38–126)
ALT: 27 U/L (ref 0–44)
AST: 30 U/L (ref 15–41)
Anion gap: 8 (ref 5–15)
BILIRUBIN TOTAL: 1.5 mg/dL — AB (ref 0.3–1.2)
BUN: 7 mg/dL (ref 6–20)
CO2: 24 mmol/L (ref 22–32)
Calcium: 9 mg/dL (ref 8.9–10.3)
Chloride: 106 mmol/L (ref 98–111)
Creatinine, Ser: 0.85 mg/dL (ref 0.61–1.24)
GFR calc Af Amer: >60 mL/min (ref >60)
GFR calc non Af Amer: >60 mL/min (ref >60)
GLUCOSE: 129 mg/dL — AB (ref 70–99)
Potassium: 4.7 mmol/L (ref 3.5–5.1)
SODIUM: 138 mmol/L (ref 135–145)
TOTAL PROTEIN: 7.3 g/dL (ref 6.5–8.1)

## 2018-04-21 LAB — I-STAT TROPONIN, ED: Troponin i, poc: 0.01 ng/mL (ref 0.00–0.08)

## 2018-04-21 MED ORDER — HYDROMORPHONE 1 MG/ML IV SOLN
INTRAVENOUS | Status: DC
  Administered 2018-04-22: 2.9 mg via INTRAVENOUS
  Administered 2018-04-22: 2.1 mg via INTRAVENOUS
  Administered 2018-04-22: 0.9 mg via INTRAVENOUS
  Administered 2018-04-22: 2.4 mg via INTRAVENOUS
  Administered 2018-04-22 – 2018-04-23 (×2): via INTRAVENOUS
  Administered 2018-04-23: 1.8 mg via INTRAVENOUS
  Administered 2018-04-23: 3.4 mg via INTRAVENOUS
  Administered 2018-04-23: 2.7 mg via INTRAVENOUS
  Administered 2018-04-23: 1.5 mg via INTRAVENOUS
  Administered 2018-04-23: 2.1 mg via INTRAVENOUS
  Administered 2018-04-23: 1.2 mg via INTRAVENOUS
  Administered 2018-04-24: 1.5 mg via INTRAVENOUS
  Administered 2018-04-24: 0.9 mg via INTRAVENOUS
  Administered 2018-04-24: 1.8 mg via INTRAVENOUS
  Administered 2018-04-24: 3 mg via INTRAVENOUS
  Administered 2018-04-24: 2.7 mg via INTRAVENOUS
  Administered 2018-04-24: 1.5 mg via INTRAVENOUS
  Administered 2018-04-25: 0.3 mg via INTRAVENOUS
  Administered 2018-04-25: 1.2 mg via INTRAVENOUS
  Administered 2018-04-25: 3 mg via INTRAVENOUS
  Administered 2018-04-25: 12:00:00 via INTRAVENOUS
  Administered 2018-04-25 – 2018-04-26 (×4): 3 mg via INTRAVENOUS
  Administered 2018-04-26: 1.2 mg via INTRAVENOUS
  Administered 2018-04-26: 1.8 mg via INTRAVENOUS
  Administered 2018-04-26: 0.9 mg via INTRAVENOUS
  Filled 2018-04-21 (×4): qty 25

## 2018-04-21 MED ORDER — HYDROMORPHONE HCL 1 MG/ML IJ SOLN: 0.5000 mg | INTRAMUSCULAR | Status: AC

## 2018-04-21 MED ORDER — DIPHENHYDRAMINE HCL 25 MG PO CAPS
25.0000 mg | ORAL_CAPSULE | Freq: Once | ORAL | Status: AC
Start: 2018-04-21 — End: 2018-04-21
  Administered 2018-04-21: 25 mg via ORAL
  Filled 2018-04-21: qty 1

## 2018-04-21 MED ORDER — DOXYCYCLINE HYCLATE 100 MG PO TABS
100.0000 mg | ORAL_TABLET | Freq: Two times a day (BID) | ORAL | Status: DC
  Administered 2018-04-22 – 2018-04-26 (×10): 100 mg via ORAL
  Filled 2018-04-21 (×10): qty 1

## 2018-04-21 MED ORDER — HYDROMORPHONE HCL 1 MG/ML IJ SOLN
0.5000 mg | INTRAMUSCULAR | Status: AC
  Administered 2018-04-21: 0.5 mg via INTRAVENOUS
  Filled 2018-04-21: qty 1

## 2018-04-21 MED ORDER — SENNOSIDES-DOCUSATE SODIUM 8.6-50 MG PO TABS
1.0000 | ORAL_TABLET | Freq: Two times a day (BID) | ORAL | Status: DC
  Administered 2018-04-22 – 2018-04-26 (×9): 1 via ORAL
  Filled 2018-04-21 (×9): qty 1

## 2018-04-21 MED ORDER — HYDROXYUREA 500 MG PO CAPS
500.0000 mg | ORAL_CAPSULE | Freq: Two times a day (BID) | ORAL | Status: DC
  Administered 2018-04-22 – 2018-04-24 (×6): 500 mg via ORAL
  Filled 2018-04-21 (×6): qty 1

## 2018-04-21 MED ORDER — FOLIC ACID 1 MG PO TABS
1.0000 mg | ORAL_TABLET | Freq: Every day | ORAL | Status: DC
  Administered 2018-04-22 – 2018-04-26 (×5): 1 mg via ORAL
  Filled 2018-04-21 (×5): qty 1

## 2018-04-21 MED ORDER — POLYETHYLENE GLYCOL 3350 17 G PO PACK: 17.0000 g | PACK | Freq: Every day | ORAL | Status: DC | PRN

## 2018-04-21 MED ORDER — NALOXONE HCL 0.4 MG/ML IJ SOLN: 0.4000 mg | INTRAMUSCULAR | Status: DC | PRN

## 2018-04-21 MED ORDER — HYDROMORPHONE HCL 1 MG/ML IJ SOLN
1.0000 mg | INTRAMUSCULAR | Status: AC
  Administered 2018-04-21: 1 mg via INTRAVENOUS

## 2018-04-21 MED ORDER — HYDROMORPHONE HCL 1 MG/ML IJ SOLN
1.0000 mg | INTRAMUSCULAR | Status: DC
  Filled 2018-04-21: qty 1

## 2018-04-21 MED ORDER — HYDROMORPHONE HCL 1 MG/ML IJ SOLN: 0.5000 mg | INTRAMUSCULAR | Status: DC

## 2018-04-21 MED ORDER — KETOROLAC TROMETHAMINE 30 MG/ML IJ SOLN
30.0000 mg | INTRAMUSCULAR | Status: AC
  Administered 2018-04-21: 30 mg via INTRAVENOUS
  Filled 2018-04-21: qty 1

## 2018-04-21 MED ORDER — SODIUM CHLORIDE 0.9% FLUSH: 9.0000 mL | INTRAVENOUS | Status: DC | PRN

## 2018-04-21 MED ORDER — HYDROMORPHONE HCL 1 MG/ML IJ SOLN: 1.0000 mg | INTRAMUSCULAR | Status: DC

## 2018-04-21 MED ORDER — KETOROLAC TROMETHAMINE 15 MG/ML IJ SOLN
15.0000 mg | Freq: Four times a day (QID) | INTRAMUSCULAR | Status: DC
  Administered 2018-04-22 (×2): 15 mg via INTRAVENOUS
  Filled 2018-04-21 (×2): qty 1

## 2018-04-21 MED ORDER — HYDROMORPHONE HCL 1 MG/ML IJ SOLN
1.0000 mg | INTRAMUSCULAR | Status: AC
  Administered 2018-04-21: 1 mg via SUBCUTANEOUS

## 2018-04-21 MED ORDER — HYDROMORPHONE HCL 1 MG/ML IJ SOLN
1.0000 mg | Freq: Once | INTRAMUSCULAR | Status: AC
  Administered 2018-04-21: 1 mg via INTRAVENOUS
  Filled 2018-04-21: qty 1

## 2018-04-21 MED ORDER — DEXTROSE-NACL 5-0.45 % IV SOLN
INTRAVENOUS | Status: DC
  Administered 2018-04-21 – 2018-04-25 (×10): via INTRAVENOUS

## 2018-04-21 MED ORDER — HYDROMORPHONE HCL 1 MG/ML IJ SOLN
0.5000 mg | INTRAMUSCULAR | Status: DC
  Filled 2018-04-21: qty 1

## 2018-04-21 MED ORDER — ONDANSETRON HCL 4 MG/2ML IJ SOLN
4.0000 mg | Freq: Four times a day (QID) | INTRAMUSCULAR | Status: DC | PRN
  Administered 2018-04-24: 4 mg via INTRAVENOUS
  Filled 2018-04-21: qty 2

## 2018-04-21 MED ORDER — ENOXAPARIN SODIUM 40 MG/0.4ML ~~LOC~~ SOLN
40.0000 mg | Freq: Every day | SUBCUTANEOUS | Status: DC
  Administered 2018-04-22 – 2018-04-26 (×5): 40 mg via SUBCUTANEOUS
  Filled 2018-04-21 (×5): qty 0.4

## 2018-04-21 NOTE — ED Triage Notes (Signed)
Pt endorses sickle cell crisis that began this morning. Endorses generalized pain. VSS.

## 2018-04-21 NOTE — ED Notes (Signed)
Pt c/o back pain med given iv and sq

## 2018-04-21 NOTE — ED Notes (Signed)
The pt reports that he is itching and his lower back hurts

## 2018-04-21 NOTE — H&P (Signed)
History and Physical    Jeffrey Morrison MHW:[id number] DOB: [birth year] DOA: 04/21/2018  PCP: Scot Jun, FNP  Patient coming from: Home.  Chief Complaint: Low back pain and chest pain.  HPI: Jeffrey Morrison is a 22 y.o. male with history of sickle cell anemia presents to the ER with complaints of worsening low back pain with chest pain over the last 24 hours.  Denies any fever chills productive cough or any difficulty urinating or vomiting or diarrhea.  Some nausea with abdominal discomfort.  ED Course: In the ER patient was complaining of pain and was started on Dilaudid.  Despite giving multiple doses patient was still having pain.  Labs revealed mild leukocytosis and hemoglobin at baseline.  Chest x-ray was unremarkable EKG shows nonspecific findings.  Review of Systems: As per HPI, rest all negative.   Past Medical History:  Diagnosis Date  . Chronic lower back pain   . Sickle cell crisis [facility])     Past Surgical History:  Procedure Laterality Date  . SPLENECTOMY       reports that he has been smoking cigarettes.  He has been smoking about 0.50 packs per day. He has never used smokeless tobacco. He reports that he does not drink alcohol or use drugs.  No Known Allergies  Family History  Problem Relation Age of Onset  . Diabetes Mother     Prior to Admission medications   Medication Sig Start Date End Date Taking? Authorizing Provider  acetaminophen (TYLENOL) 325 MG tablet Take 2 tablets (650 mg total) by mouth every 6 (six) hours as needed. 04/14/18  Yes Hedges, Dellis Filbert, PA-C  doxycycline (VIBRAMYCIN) 100 MG capsule Take 1 capsule (100 mg total) by mouth 2 (two) times daily. 04/14/18  Yes Hedges, Dellis Filbert, PA-C  folic acid (FOLVITE) 1 MG tablet Take 1 tablet (1 mg total) by mouth daily. 09/02/17  Yes Scot Jun, FNP  hydroxyurea (HYDREA) 500 MG capsule Take 1 capsule (500 mg total) by mouth daily. May take with food to minimize GI side effects. Patient  taking differently: Take 500 mg by mouth 2 (two) times daily. May take with food to minimize GI side effects. 09/02/17  Yes Scot Jun, FNP  ibuprofen (ADVIL,MOTRIN) 200 MG tablet Take 200 mg by mouth every 6 (six) hours as needed for moderate pain.   Yes [provider]  buPROPion (WELLBUTRIN XL) 150 MG 24 hr tablet Take 1 tablet (150 mg total) by mouth daily. Patient not taking: Reported on 10/02/2017 09/02/17   Scot Jun, FNP  citalopram (CELEXA) 40 MG tablet Take 1 tablet (40 mg total) by mouth daily. Patient not taking: Reported on 10/02/2017 09/02/17   Scot Jun, FNP  gabapentin (NEURONTIN) 300 MG capsule Take 1 capsule (300 mg total) by mouth 3 (three) times daily. Patient not taking: Reported on 10/02/2017 09/02/17   Scot Jun, FNP  ibuprofen (ADVIL,MOTRIN) 600 MG tablet Take 1 tablet (600 mg total) by mouth every 8 (eight) hours as needed (sickle aching cell pain and inflammation). Patient not taking: Reported on 10/02/2017 09/02/17   Scot Jun, FNP    Physical Exam: Vitals:   04/21/18 1519 04/21/18 1711 04/21/18 1938 04/21/18 2224  BP: (!) 102/55 (!) 92/43 (!) 104/50 104/69  Pulse: 84 66 86 74  Resp: 16 16 17 18   Temp: 98.6 F (37 C) 98.2 F (36.8 C)    TempSrc: Oral Oral    SpO2: 96% 99% 100% 100%  Weight:  Height:          Constitutional: Moderately built and nourished. Vitals:   04/21/18 1519 04/21/18 1711 04/21/18 1938 04/21/18 2224  BP: (!) 102/55 (!) 92/43 (!) 104/50 104/69  Pulse: 84 66 86 74  Resp: 16 16 17 18   Temp: 98.6 F (37 C) 98.2 F (36.8 C)    TempSrc: Oral Oral    SpO2: 96% 99% 100% 100%  Weight:      Height:       Eyes: Anicteric no pallor. ENMT: No discharge from the ears eyes nose or mouth. Neck: No mass felt.  No neck rigidity but no JVD appreciated. Respiratory: No rhonchi or crepitations. Cardiovascular: S1-S2 heard no murmurs appreciated. Abdomen: Soft nontender bowel sounds  present. Musculoskeletal: No edema.  No joint effusion. Skin: No rash.  Neurologic: Alert awake oriented to time place and person.  Moves all extremities. Psychiatric: Appears normal per normal affect.   Labs on Admission: I have personally reviewed following labs and imaging studies  CBC: Recent Labs  Lab 04/21/18 1143  WBC 17.1*  NEUTROABS 10.1*  HGB 7.4*  HCT 22.7*  MCV 97.0  PLT 588*   Basic Metabolic Panel: Recent Labs  Lab 04/21/18 1143  NA 138  K 4.7  CL 106  CO2 24  GLUCOSE 129*  BUN 7  CREATININE 0.85  CALCIUM 9.0   GFR: Estimated Creatinine Clearance: 129.6 mL/min (by C-G formula based on SCr of 0.85 mg/dL). Liver Function Tests: Recent Labs  Lab 04/21/18 1143  AST 30  ALT 27  ALKPHOS 64  BILITOT 1.5*  PROT 7.3  ALBUMIN 3.5   No results for input(s): LIPASE, AMYLASE in the last 168 hours. No results for input(s): AMMONIA in the last 168 hours. Coagulation Profile: No results for input(s): INR, PROTIME in the last 168 hours. Cardiac Enzymes: No results for input(s): CKTOTAL, CKMB, CKMBINDEX, TROPONINI in the last 168 hours. BNP (last 3 results) No results for input(s): PROBNP in the last 8760 hours. HbA1C: No results for input(s): HGBA1C in the last 72 hours. CBG: No results for input(s): GLUCAP in the last 168 hours. Lipid Profile: No results for input(s): CHOL, HDL, LDLCALC, TRIG, CHOLHDL, LDLDIRECT in the last 72 hours. Thyroid Function Tests: No results for input(s): TSH, T4TOTAL, FREET4, T3FREE, THYROIDAB in the last 72 hours. Anemia Panel: Recent Labs    04/21/18 1143  RETICCTPCT 14.4*   Urine analysis:    Component Value Date/Time   COLORURINE AMBER (A) 08/24/2017 2025   APPEARANCEUR CLEAR 08/24/2017 2025   LABSPEC 1.015 10/02/2017 1116   PHURINE 5.5 10/02/2017 1116   GLUCOSEU NEGATIVE 10/02/2017 1116   HGBUR TRACE (A) 10/02/2017 1116   BILIRUBINUR NEGATIVE 10/02/2017 1116   [place] 10/02/2017 1116   PROTEINUR  >=300 (A) 10/02/2017 1116   UROBILINOGEN 1.0 10/02/2017 1116   NITRITE NEGATIVE 10/02/2017 1116   LEUKOCYTESUR SMALL (A) 10/02/2017 1116   Sepsis Labs: @LABRCNTIP (procalcitonin:4,lacticidven:4) )No results found for this or any previous visit (from the past 240 hour(s)).   Radiological Exams on Admission: Dg Chest 2 View  Result Date: 04/21/2018 CLINICAL DATA:  Sickle cell crisis EXAM: CHEST - 2 VIEW COMPARISON:  08/26/2017 FINDINGS: The heart size and mediastinal contours are within normal limits. Both lungs are clear. The visualized skeletal structures are unremarkable. IMPRESSION: No active cardiopulmonary disease. Electronically Signed   By: Kathreen Devoid   On: 04/21/2018 13:24    EKG: Independently reviewed.  Normal sinus rhythm with nonspecific ST-T changes.  Assessment/Plan  Principal Problem:   Sickle cell pain crisis ([place]) Active Problems:   Anemia of chronic disease    1. Sickle cell pain crisis -patient placed on Dilaudid PCA.  Toradol scheduled dose.  IV hydration.  Patient does not have any definite signs of acute chest syndrome.  Troponin and d-dimer are pending.  On hydroxyurea. 2. Anemia from sickle cell disease follow CBC.   DVT prophylaxis: Lovenox. Code Status: Full code. Family Communication: Discussed with patient. Disposition Plan: Home. Consults called: None. Admission status: Inpatient.   Rise Patience MD [facility] Pager [phone].  If 7PM-7AM, please contact night-coverage [email or web address] Password [facility]  04/21/2018, 10:38 PM

## 2018-04-21 NOTE — ED Provider Notes (Signed)
Pt signed out to me by Amada Kingfisher PA-C. He is a 22 year old male with SCD who presents with chest pain/back pain consistent with prior pain crises since this morning. Labs are at baseline. He has chronic leukocytosis and anemia is around baseline. UDS is positive for THC. He's had several doses of pain medicine and it's uncontrolled. Will continue to monitor as blood pressure have been soft and he has not been on narcotics chronically.  Rechecked pt. After third round of medicine pain is still uncontrolled. Discussed with Dr. Hal Hope who will admit.    Recardo Evangelist, PA-C 04/21/18 2247    Drenda Freeze, MD 04/21/18 [id number]

## 2018-04-21 NOTE — ED Notes (Signed)
Med given pt still c/o pain  Pa to talk to the pt

## 2018-04-21 NOTE — ED Notes (Signed)
The pt report that his pain is no better

## 2018-04-21 NOTE — ED Provider Notes (Signed)
[facility] EMERGENCY DEPARTMENT Provider Note   CSN: [id number] Arrival date & time: 04/21/18  1043     History   Chief Complaint Chief Complaint  Patient presents with  . Sickle Cell Pain Crisis    HPI Jeffrey Morrison is a 22 y.o. male with history of sickle cell anemia, chronic low back pain presents for evaluation of acute onset, constant chest and low back pain that began at 7 AM this morning.  Pain awoke the patient from sleep.  Pain is constant, throbbing, localized to a focal area of the anterior inferior chest wall and generalized low back.  No radiation of pain.  Pain worsens with bending.  He denies fevers, chills, shortness of breath, abdominal pain, nausea, vomiting, cough, numbness, weakness, saddle anesthesia, bowel or bladder incontinence,or IV drug use.  States this feels like his usual sickle cell pain crises.  Did not try anything for his symptoms and did not have any medicines at home.  The history is provided by the patient.    Past Medical History:  Diagnosis Date  . Chronic lower back pain   . Sickle cell crisis [facility])     Patient Active Problem List   Diagnosis Date Noted  . 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 Erectile dysfunction 08/27/2017  . Depression 08/25/2017  . OSA (obstructive sleep apnea)   . Anemia of chronic disease     Past Surgical History:  Procedure Laterality Date  . SPLENECTOMY          Home Medications    Prior to Admission medications   Medication Sig Start Date End Date Taking? Authorizing Provider  acetaminophen (TYLENOL) 325 MG tablet Take 2 tablets (650 mg total) by mouth every 6 (six) hours as needed. 04/14/18  Yes Hedges, Dellis Filbert, PA-C  doxycycline (VIBRAMYCIN) 100 MG capsule Take 1 capsule (100 mg total) by mouth 2 (two) times daily. 04/14/18  Yes Hedges, Dellis Filbert, PA-C  folic acid (FOLVITE) 1 MG tablet Take 1 tablet (1 mg total) by mouth daily. 09/02/17  Yes Scot Jun, FNP  hydroxyurea  (HYDREA) 500 MG capsule Take 1 capsule (500 mg total) by mouth daily. May take with food to minimize GI side effects. Patient taking differently: Take 500 mg by mouth 2 (two) times daily. May take with food to minimize GI side effects. 09/02/17  Yes Scot Jun, FNP  ibuprofen (ADVIL,MOTRIN) 200 MG tablet Take 200 mg by mouth every 6 (six) hours as needed for moderate pain.   Yes [provider]  buPROPion (WELLBUTRIN XL) 150 MG 24 hr tablet Take 1 tablet (150 mg total) by mouth daily. Patient not taking: Reported on 10/02/2017 09/02/17   Scot Jun, FNP  citalopram (CELEXA) 40 MG tablet Take 1 tablet (40 mg total) by mouth daily. Patient not taking: Reported on 10/02/2017 09/02/17   Scot Jun, FNP  gabapentin (NEURONTIN) 300 MG capsule Take 1 capsule (300 mg total) by mouth 3 (three) times daily. Patient not taking: Reported on 10/02/2017 09/02/17   Scot Jun, FNP  ibuprofen (ADVIL,MOTRIN) 600 MG tablet Take 1 tablet (600 mg total) by mouth every 8 (eight) hours as needed (sickle aching cell pain and inflammation). Patient not taking: Reported on 10/02/2017 09/02/17   Scot Jun, FNP    Family History Family History  Problem Relation Age of Onset  . Diabetes Mother     Social History Social History   Tobacco Use  . Smoking status: Current Every Day  Smoker    Packs/day: 0.50    Types: Cigarettes  . Smokeless tobacco: Never Used  Substance Use Topics  . Alcohol use: No  . Drug use: No     Allergies   Patient has no known allergies.   Review of Systems Review of Systems  Constitutional: Negative for chills and fever.  Respiratory: Negative for shortness of breath.   Cardiovascular: Positive for chest pain.  Gastrointestinal: Negative for abdominal pain and vomiting.  Musculoskeletal: Positive for back pain.  Neurological: Negative for syncope, weakness and numbness.  All other systems reviewed and are negative.    Physical  Exam Updated Vital Signs BP (!) 102/55 (BP Location: Left Arm)   Pulse 84   Temp 98.6 F (37 C) (Oral)   Resp 16   Ht 5\' 8"  (1.727 m)   Wt 77.1 kg (170 lb)   SpO2 96%   BMI 25.85 kg/m   Physical Exam  Constitutional: He appears well-developed and well-nourished. No distress.  HENT:  Head: Normocephalic and atraumatic.  Eyes: Conjunctivae are normal. Right eye exhibits no discharge. Left eye exhibits no discharge.  Neck: No JVD present. No tracheal deviation present.  Cardiovascular: Normal rate, regular rhythm, normal heart sounds and intact distal pulses.  2+ radial and DP/PT pulses bilaterally, Homans sign absent bilaterally, no lower extremity edema, no palpable cords, compartments are soft   Pulmonary/Chest: Effort normal and breath sounds normal. He exhibits tenderness.  Focal tenderness along the inferior sternum with no deformity, crepitus, flail segment, or ecchymosis noted.  Equal rise and fall of chest, no increased work of breathing.  Speaking in full sentences without difficulty.    Abdominal: Soft. Bowel sounds are normal. He exhibits no distension.  Musculoskeletal: Normal range of motion. He exhibits tenderness. He exhibits no edema.  Diffuse midline lumbar spine tenderness with bilateral paralumbar muscle tenderness.  No deformity, crepitus, or step-off noted.  5/5 strength of BLE major muscle groups.  Neurological: He is alert.  Fluent speech with no evidence of dysarthria or aphasia, no facial droop, sensation intact to soft touch of extremities.  Ambulatory without difficult, able to Heel Walk and Toe Walk without difficulty  Skin: Skin is warm and dry. No erythema.  Psychiatric: He has a normal mood and affect. His behavior is normal.  Nursing note and vitals reviewed.    ED Treatments / Results  Labs (all labs ordered are listed, but only abnormal results are displayed) Labs Reviewed  COMPREHENSIVE METABOLIC PANEL - Abnormal; Notable for the following  components:      Result Value   Glucose, Bld 129 (*)    Total Bilirubin 1.5 (*)    All other components within normal limits  CBC WITH DIFFERENTIAL/PLATELET - Abnormal; Notable for the following components:   WBC 17.1 (*)    RBC 2.34 (*)    Hemoglobin 7.4 (*)    HCT 22.7 (*)    RDW 21.8 (*)    Platelets 554 (*)    nRBC 7 (*)    Neutro Abs 10.1 (*)    Lymphs Abs 5.8 (*)    Monocytes Absolute 1.2 (*)    All other components within normal limits  RETICULOCYTES - Abnormal; Notable for the following components:   Retic Ct Pct 14.4 (*)    RBC. 2.34 (*)    Retic Count, Absolute 337.0 (*)    All other components within normal limits  RAPID URINE DRUG SCREEN, HOSP PERFORMED  I-STAT TROPONIN, ED    EKG EKG  Interpretation  Date/Time:  Wednesday April 21 2018 14:16:19 EDT Ventricular Rate:  63 PR Interval:  166 QRS Duration: 88 QT Interval:  406 QTC Calculation: 415 R Axis:   74 Text Interpretation:  Normal sinus rhythm Septal infarct , age undetermined Abnormal ECG No significant change since last tracing Confirmed by Wandra Arthurs [phone]) on 04/21/2018 3:47:24 PM   Radiology Dg Chest 2 View  Result Date: 04/21/2018 CLINICAL DATA:  Sickle cell crisis EXAM: CHEST - 2 VIEW COMPARISON:  08/26/2017 FINDINGS: The heart size and mediastinal contours are within normal limits. Both lungs are clear. The visualized skeletal structures are unremarkable. IMPRESSION: No active cardiopulmonary disease. Electronically Signed   By: Kathreen Devoid   On: 04/21/2018 13:24    Procedures Procedures (including critical care time)  Medications Ordered in ED Medications  dextrose 5 %-0.45 % sodium chloride infusion ( Intravenous New Bag/Given 04/21/18 1350)  HYDROmorphone (DILAUDID) injection 0.5 mg (has no administration in time range)    Or  HYDROmorphone (DILAUDID) injection 0.5 mg (has no administration in time range)  HYDROmorphone (DILAUDID) injection 1 mg (has no administration in time range)     Or  HYDROmorphone (DILAUDID) injection 1 mg (has no administration in time range)  ketorolac (TORADOL) 30 MG/ML injection 30 mg (30 mg Intravenous Given 04/21/18 1333)  HYDROmorphone (DILAUDID) injection 0.5 mg (0.5 mg Intravenous Given 04/21/18 1352)    Or  HYDROmorphone (DILAUDID) injection 0.5 mg ( Subcutaneous See Alternative 04/21/18 1352)  HYDROmorphone (DILAUDID) injection 1 mg (1 mg Intravenous Given 04/21/18 1529)    Or  HYDROmorphone (DILAUDID) injection 1 mg ( Subcutaneous See Alternative 04/21/18 1529)     Initial Impression / Assessment and Plan / ED Course  I have reviewed the triage vital signs and the nursing notes.  Pertinent labs & imaging results that were available during my care of the patient were reviewed by me and considered in my medical decision making (see chart for details).     Patient presents for evaluation of sickle cell pain crisis.  States this feels similar to prior sickle cell pain crises.  He is afebrile, vital signs are stable.  He is nontoxic in appearance.  No red flag signs concerning for cauda equina or spinal abscess.  Pain to the chest is focal along the sternum, does not appear to be cardiac in etiology.  EKG shows no acute changes from last tracing, no ischemic changes.  Troponin is within normal limits.  Doubt ACS/MI.  Chest x-ray shows no acute cardiopulmonary abnormalities, no evidence of edema or consolidation.  Lungs are clear to auscultation bilaterally.  No evidence of acute chest syndrome. He is neurovascularly intact, ambulatory without difficulty.  He is a leukocytosis on lab work reviewed by me though this appears to be chronic.  Remainder of lab work is stable additionally.  Has received 1 dose of Dilaudid thus far.   3:54 PM Signed out to oncoming provider PA [place].  Dissipate discharge home after pain control with follow-up with PCP.  May require admission if pain remains uncontrolled.  Final Clinical Impressions(s) / ED Diagnoses   Final  diagnoses:  Sickle cell pain crisis [facility])    ED Discharge Orders    None       Renita Papa, PA-C 04/21/18 1554    Lajean Saver, MD 04/23/18 1318

## 2018-04-22 DIAGNOSIS — D638 Anemia in other chronic diseases classified elsewhere: Secondary | ICD-10-CM

## 2018-04-22 DIAGNOSIS — D5700 Hb-SS disease with crisis, unspecified: Principal | ICD-10-CM

## 2018-04-22 LAB — D-DIMER, QUANTITATIVE: D-Dimer, Quant: 3.1 ug/mL-FEU — ABNORMAL HIGH (ref 0.00–0.50)

## 2018-04-22 LAB — TROPONIN I: Troponin I: <0.03 ng/mL (ref <0.03)

## 2018-04-22 LAB — MRSA PCR SCREENING: MRSA by PCR: NEGATIVE

## 2018-04-22 MED ORDER — LIP MEDEX EX OINT
TOPICAL_OINTMENT | CUTANEOUS | Status: DC | PRN
  Administered 2018-04-22: 22:00:00 via TOPICAL
  Filled 2018-04-22: qty 7

## 2018-04-22 MED ORDER — KETOROLAC TROMETHAMINE 15 MG/ML IJ SOLN
30.0000 mg | Freq: Four times a day (QID) | INTRAMUSCULAR | Status: AC
  Administered 2018-04-22 – 2018-04-23 (×3): 30 mg via INTRAVENOUS
  Filled 2018-04-22 (×3): qty 2

## 2018-04-22 MED ORDER — DIPHENHYDRAMINE HCL 25 MG PO CAPS
25.0000 mg | ORAL_CAPSULE | Freq: Four times a day (QID) | ORAL | Status: DC | PRN
  Administered 2018-04-22 – 2018-04-25 (×5): 25 mg via ORAL
  Filled 2018-04-22 (×5): qty 1

## 2018-04-22 NOTE — ED Notes (Signed)
System downtime, labs draw.

## 2018-04-22 NOTE — Progress Notes (Signed)
Patient ID: Jeffrey Morrison, male   DOB: November 08, 1995, 22 y.o.   MRN: [id number] Subjective:  Jeffrey Morrison is a 22 y.o. male with history of sickle cell anemia admitted yesterday for sickle cell pain crisis. He is still in pain, especially around the right hip joint and right thigh. No swelling or redness, no fever. No chest pain, no SOB.  Objective:  Vital signs in last 24 hours:  Vitals:   04/22/18 1055 04/22/18 1210 04/22/18 1421 04/22/18 1551  BP: (!) 91/53  112/65   Pulse: (!) 56  (!) 56   Resp: 13 10 16 14   Temp: 98 F (36.7 C)     TempSrc: Oral     SpO2: 91% 94% 91% 95%  Weight:      Height:       Intake/Output from previous day:   Intake/Output Summary (Last 24 hours) at 04/22/2018 1759 Last data filed at 04/22/2018 1755 Gross per 24 hour  Intake 3037.33 ml  Output 700 ml  Net 2337.33 ml    Physical Exam: General: Alert, awake, oriented x3, in no acute distress.  HEENT: [place]/AT PEERL, EOMI Neck: Trachea midline,  no masses, no thyromegal,y no JVD, no carotid bruit OROPHARYNX:  Moist, No exudate/ erythema/lesions.  Heart: Regular rate and rhythm, without murmurs, rubs, gallops, PMI non-displaced, no heaves or thrills on palpation.  Lungs: Clear to auscultation, no wheezing or rhonchi noted. No increased vocal fremitus resonant to percussion  Abdomen: Soft, nontender, nondistended, positive bowel sounds, no masses no hepatosplenomegaly noted..  Neuro: No focal neurological deficits noted cranial nerves II through XII grossly intact. DTRs 2+ bilaterally upper and lower extremities. Strength 5 out of 5 in bilateral upper and lower extremities. Musculoskeletal: No warm swelling or erythema around joints, no spinal tenderness noted. Multiple healed scars/scabs on bilateral lower limbs  Psychiatric: Patient alert and oriented x3, good insight and cognition, good recent to remote recall. Lymph node survey: No cervical axillary or inguinal lymphadenopathy noted.  Lab  Results:  Basic Metabolic Panel:    Component Value Date/Time   NA 138 04/21/2018 1143   NA 140 10/02/2017 1126   K 4.7 04/21/2018 1143   CL 106 04/21/2018 1143   CO2 24 04/21/2018 1143   BUN 7 04/21/2018 1143   BUN 9 10/02/2017 1126   CREATININE 0.85 04/21/2018 1143   GLUCOSE 129 (H) 04/21/2018 1143   CALCIUM 9.0 04/21/2018 1143   CBC:    Component Value Date/Time   WBC 17.1 (H) 04/21/2018 1143   HGB 7.4 (L) 04/21/2018 1143   HGB 7.5 (L) 10/02/2017 1126   HCT 22.7 (L) 04/21/2018 1143   HCT 22.0 (L) 10/02/2017 1126   PLT 554 (H) 04/21/2018 1143   PLT 511 (H) 10/02/2017 1126   MCV 97.0 04/21/2018 1143   MCV 94 10/02/2017 1126   NEUTROABS 10.1 (H) 04/21/2018 1143   NEUTROABS 8.8 (H) 10/02/2017 1126   LYMPHSABS 5.8 (H) 04/21/2018 1143   LYMPHSABS 5.2 (H) 10/02/2017 1126   MONOABS 1.2 (H) 04/21/2018 1143   EOSABS 0.0 04/21/2018 1143   EOSABS 0.2 10/02/2017 1126   BASOSABS 0.0 04/21/2018 1143   BASOSABS 0.1 10/02/2017 1126    Recent Results (from the past 240 hour(s))  MRSA PCR Screening     Status: None   Collection Time: 04/22/18  6:53 AM  Result Value Ref Range Status   MRSA by PCR NEGATIVE NEGATIVE Final    Comment:        The GeneXpert MRSA  Assay (FDA approved for NASAL specimens only), is one component of a comprehensive MRSA colonization surveillance program. It is not intended to diagnose MRSA infection nor to guide or monitor treatment for MRSA infections. Performed at [facility], [place] [address], [place], [place] [address]    Studies/Results: Dg Chest 2 View  Result Date: 04/21/2018 CLINICAL DATA:  Sickle cell crisis EXAM: CHEST - 2 VIEW COMPARISON:  08/26/2017 FINDINGS: The heart size and mediastinal contours are within normal limits. Both lungs are clear. The visualized skeletal structures are unremarkable. IMPRESSION: No active cardiopulmonary disease. Electronically Signed   By: Kathreen Devoid   On: 04/21/2018 13:24     Medications: Scheduled Meds: . doxycycline  100 mg Oral BID  . enoxaparin (LOVENOX) injection  40 mg Subcutaneous Daily  . folic acid  1 mg Oral Daily  . HYDROmorphone   Intravenous Q4H  . hydroxyurea  500 mg Oral BID  . ketorolac  30 mg Intravenous Q6H  . senna-docusate  1 tablet Oral BID   Continuous Infusions: . dextrose 5 % and 0.45% NaCl 125 mL/hr at 04/22/18 1755   PRN Meds:.diphenhydrAMINE, naloxone **AND** sodium chloride flush, ondansetron (ZOFRAN) IV, polyethylene glycol  Assessment/Plan: Principal Problem:   Sickle cell pain crisis ([place]) Active Problems:   Anemia of chronic disease  1. Hb Sickle Cell Disease with crisis: Continue IVF D5 .45% Saline @ 125 mls/hour, continue weight based Dilaudid PCA, IV Toradol 30 mg Q 6 H, Monitor vitals very closely, Re-evaluate pain scale regularly, 2 L of Oxygen by [place]. 2. Leukocytosis: Most likely reactive to sickle cell pain crisis, no evidence of infection 3. Sickle Cell Anemia: Hb is at baseline, no indication for blood transfusion at this time 4. Chronic pain Syndrome: Patient is not on opiates at the moment, no PCP, no hematologist, takes Ibuprofen occasionally for pain.  Code Status: Full Code Family Communication: N/A Disposition Plan: Not yet ready for discharge  Taralynn Quiett  If 7PM-7AM, please contact night-coverage.  04/22/2018, 5:59 PM  LOS: 1 day

## 2018-04-23 LAB — LACTATE DEHYDROGENASE: LDH: 266 U/L — ABNORMAL HIGH (ref 98–192)

## 2018-04-23 LAB — RETICULOCYTES
RBC.: 1.78 MIL/uL — ABNORMAL LOW (ref 4.22–5.81)
RETIC CT PCT: 13.8 % — AB (ref 0.4–3.1)
Retic Count, Absolute: 245.6 10*3/uL — ABNORMAL HIGH (ref 19.0–186.0)

## 2018-04-23 MED ORDER — PNEUMOCOCCAL VAC POLYVALENT 25 MCG/0.5ML IJ INJ
0.5000 mL | INJECTION | INTRAMUSCULAR | Status: AC
  Administered 2018-04-24: 0.5 mL via INTRAMUSCULAR
  Filled 2018-04-23: qty 0.5

## 2018-04-23 MED ORDER — ACETAMINOPHEN 325 MG PO TABS
650.0000 mg | ORAL_TABLET | Freq: Four times a day (QID) | ORAL | Status: DC | PRN
Start: 2018-04-23 — End: 2018-04-26
  Administered 2018-04-23: 650 mg via ORAL
  Filled 2018-04-23: qty 2

## 2018-04-23 NOTE — Progress Notes (Signed)
I witnessed the waste of 3-4 mls of Dilaudid per Dierdre Harness, RN.

## 2018-04-23 NOTE — Progress Notes (Signed)
LCSW consulted for " patient admitted from facility"   Patient is from an [facility] ( sober living). No LCSW needs at dc. Patient will dc to [facility] the same as patient would dc home. No packet or call for report needed.   [employer] [phone]  LCSW signing off. No CSW needs at this time.   Carolin Coy [place]

## 2018-04-23 NOTE — Progress Notes (Signed)
Patient's line PCA line broke at connection to fluids  When re-priming the line, the nurse accidentally primed through the broken line first and then replaced it with new line to prime. About 3-44mls was wasted.

## 2018-04-23 NOTE — Progress Notes (Signed)
Patient ID: Jeffrey Morrison, male   DOB: May 20, 1996, 22 y.o.   MRN: [id number] Subjective:  Patient slowly improving, had low grade fever this morning, but not sustained. Pain is still localized to the right hip joints and thigh. No chest pain, no SOB.  Objective:  Vital signs in last 24 hours:  Vitals:   04/23/18 1431 04/23/18 1440 04/23/18 1500 04/23/18 1555  BP: 118/61     Pulse: 69     Resp: 19  16   Temp: (!) 100.4 F (38 C) 100.3 F (37.9 C)  99.9 F (37.7 C)  TempSrc: Oral Oral  Oral  SpO2: 93%  95%   Weight:      Height:        Intake/Output from previous day:   Intake/Output Summary (Last 24 hours) at 04/23/2018 1651 Last data filed at 04/23/2018 0952 Gross per 24 hour  Intake 1355.72 ml  Output 952 ml  Net 403.72 ml    Physical Exam: General: Alert, awake, oriented x3, in no acute distress.  HEENT: [place]/AT [place], EOMI Neck: Trachea midline,  no masses, no thyromegal,y no JVD, no carotid bruit OROPHARYNX:  Moist, No exudate/ erythema/lesions.  Heart: Regular rate and rhythm, without murmurs, rubs, gallops, PMI non-displaced, no heaves or thrills on palpation.  Lungs: Clear to auscultation, no wheezing or rhonchi noted. No increased vocal fremitus resonant to percussion  Abdomen: Soft, nontender, nondistended, positive bowel sounds, no masses no hepatosplenomegaly noted..  Neuro: No focal neurological deficits noted cranial nerves II through XII grossly intact. DTRs 2+ bilaterally upper and lower extremities. Strength 5 out of 5 in bilateral upper and lower extremities. Musculoskeletal: No warm swelling or erythema around joints, no spinal tenderness noted. Psychiatric: Patient alert and oriented x3, good insight and cognition, good recent to remote recall. Lymph node survey: No cervical axillary or inguinal lymphadenopathy noted.  Lab Results:  Basic Metabolic Panel:    Component Value Date/Time   NA 138 04/21/2018 1143   NA 140 10/02/2017 1126   K 4.7  04/21/2018 1143   CL 106 04/21/2018 1143   CO2 24 04/21/2018 1143   BUN 7 04/21/2018 1143   BUN 9 10/02/2017 1126   CREATININE 0.85 04/21/2018 1143   GLUCOSE 129 (H) 04/21/2018 1143   CALCIUM 9.0 04/21/2018 1143   CBC:    Component Value Date/Time   WBC 17.1 (H) 04/21/2018 1143   HGB 7.4 (L) 04/21/2018 1143   HGB 7.5 (L) 10/02/2017 1126   HCT 22.7 (L) 04/21/2018 1143   HCT 22.0 (L) 10/02/2017 1126   PLT 554 (H) 04/21/2018 1143   PLT 511 (H) 10/02/2017 1126   MCV 97.0 04/21/2018 1143   MCV 94 10/02/2017 1126   NEUTROABS 10.1 (H) 04/21/2018 1143   NEUTROABS 8.8 (H) 10/02/2017 1126   LYMPHSABS 5.8 (H) 04/21/2018 1143   LYMPHSABS 5.2 (H) 10/02/2017 1126   MONOABS 1.2 (H) 04/21/2018 1143   EOSABS 0.0 04/21/2018 1143   EOSABS 0.2 10/02/2017 1126   BASOSABS 0.0 04/21/2018 1143   BASOSABS 0.1 10/02/2017 1126    Recent Results (from the past 240 hour(s))  MRSA PCR Screening     Status: None   Collection Time: 04/22/18  6:53 AM  Result Value Ref Range Status   MRSA by PCR NEGATIVE NEGATIVE Final    Comment:        The GeneXpert MRSA Assay (FDA approved for NASAL specimens only), is one component of a comprehensive MRSA colonization surveillance program. It is not intended  to diagnose MRSA infection nor to guide or monitor treatment for MRSA infections. Performed at [facility], [place] [address], [place], [place] [address]     Studies/Results: No results found.  Medications: Scheduled Meds: . doxycycline  100 mg Oral BID  . enoxaparin (LOVENOX) injection  40 mg Subcutaneous Daily  . folic acid  1 mg Oral Daily  . HYDROmorphone   Intravenous Q4H  . hydroxyurea  500 mg Oral BID  . [START ON 04/24/2018] pneumococcal 23 valent vaccine  0.5 mL Intramuscular Tomorrow-1000  . senna-docusate  1 tablet Oral BID   Continuous Infusions: . dextrose 5 % and 0.45% NaCl 125 mL/hr at 04/23/18 1154   PRN Meds:.acetaminophen, diphenhydrAMINE, lip balm, naloxone  **AND** sodium chloride flush, ondansetron (ZOFRAN) IV, polyethylene glycol  Assessment/Plan: Principal Problem:   Sickle cell pain crisis ([place]) Active Problems:   Anemia of chronic disease  1. Hb Sickle Cell Disease with crisis: Continue IVF, continue weight based Dilaudid PCA, IV Toradol 30 mg Q 6 H, Monitor vitals very closely, Re-evaluate pain scale regularly, 2 L of Oxygen by [place]. 2. Leukocytosis: Most likely reactive to sickle cell pain crisis, no evidence of infection 3. Sickle Cell Anemia: Hb is at baseline, no indication for blood transfusion at this time 4. Chronic pain Syndrome: Patient is not on opiates at the moment, no PCP, no hematologist, takes Ibuprofen occasionally for pain.  Code Status: Full Code Family Communication: N/A Disposition Plan: Not yet ready for discharge  Breelle Hollywood  If 7PM-7AM, please contact night-coverage.  04/23/2018, 4:51 PM  LOS: 2 days

## 2018-04-24 LAB — COMPREHENSIVE METABOLIC PANEL
ALT: 65 U/L — ABNORMAL HIGH (ref 0–44)
ANION GAP: 8 (ref 5–15)
AST: 70 U/L — AB (ref 15–41)
Albumin: 3.1 g/dL — ABNORMAL LOW (ref 3.5–5.0)
Alkaline Phosphatase: 59 U/L (ref 38–126)
BILIRUBIN TOTAL: 3 mg/dL — AB (ref 0.3–1.2)
BUN: 9 mg/dL (ref 6–20)
CHLORIDE: 103 mmol/L (ref 98–111)
CO2: 25 mmol/L (ref 22–32)
Calcium: 8.1 mg/dL — ABNORMAL LOW (ref 8.9–10.3)
Creatinine, Ser: 0.83 mg/dL (ref 0.61–1.24)
Glucose, Bld: 107 mg/dL — ABNORMAL HIGH (ref 70–99)
POTASSIUM: 4 mmol/L (ref 3.5–5.1)
Sodium: 136 mmol/L (ref 135–145)
Total Protein: 6.4 g/dL — ABNORMAL LOW (ref 6.5–8.1)

## 2018-04-24 NOTE — Progress Notes (Signed)
Patient ID: Jeffrey Morrison, male   DOB: [birth year], 22 y.o.   MRN: [id number] Subjective:  Patient is doing much better today, no new symptoms, no fever, no chest pain, no SOB. Patient has not attempted to ambulate, no joint swelling or redness.  Objective:  Vital signs in last 24 hours:  Vitals:   04/24/18 0757 04/24/18 0953 04/24/18 1139 04/24/18 1345  BP:  111/60  (!) 100/55  Pulse:  (!) 59  (!) 58  Resp: 12 12 14 11   Temp:  98.4 F (36.9 C)  98.3 F (36.8 C)  TempSrc:  Oral  Oral  SpO2: 100% 100% 100% 100%  Weight:      Height:        Intake/Output from previous day:   Intake/Output Summary (Last 24 hours) at 04/24/2018 1430 Last data filed at 04/24/2018 1356 Gross per 24 hour  Intake 2876.4 ml  Output 900 ml  Net 1976.4 ml    Physical Exam: General: Alert, awake, oriented x3, in no acute distress.  HEENT: [place]/AT [place]ER[place], EOMI Neck: Trachea midline,  no masses, no thyromegal,y no JVD, no carotid bruit OROPHARYNX:  Moist, No exudate/ erythema/lesions.  Heart: Regular rate and rhythm, without murmurs, rubs, gallops, PMI non-displaced, no heaves or thrills on palpation.  Lungs: Clear to auscultation, no wheezing or rhonchi noted. No increased vocal fremitus resonant to percussion  Abdomen: Soft, nontender, nondistended, positive bowel sounds, no masses no hepatosplenomegaly noted..  Neuro: No focal neurological deficits noted cranial nerves II through XII grossly intact. DTRs 2+ bilaterally upper and lower extremities. Strength 5 out of 5 in bilateral upper and lower extremities. Musculoskeletal: No warm swelling or erythema around joints, no spinal tenderness noted. Psychiatric: Patient alert and oriented x3, good insight and cognition, good recent to remote recall. Lymph node survey: No cervical axillary or inguinal lymphadenopathy noted.  Lab Results:  Basic Metabolic Panel:    Component Value Date/Time   NA 136 04/24/2018 0547   NA 140 10/02/2017 1126   K 4.0  04/24/2018 0547   CL 103 04/24/2018 0547   CO2 25 04/24/2018 0547   BUN 9 04/24/2018 0547   BUN 9 10/02/2017 1126   CREATININE 0.83 04/24/2018 0547   GLUCOSE 107 (H) 04/24/2018 0547   CALCIUM 8.1 (L) 04/24/2018 0547   CBC:    Component Value Date/Time   WBC 17.1 (H) 04/21/2018 1143   HGB 7.4 (L) 04/21/2018 1143   HGB 7.5 (L) 10/02/2017 1126   HCT 22.7 (L) 04/21/2018 1143   HCT 22.0 (L) 10/02/2017 1126   PLT 554 (H) 04/21/2018 1143   PLT 511 (H) 10/02/2017 1126   MCV 97.0 04/21/2018 1143   MCV 94 10/02/2017 1126   NEUTROABS 10.1 (H) 04/21/2018 1143   NEUTROABS 8.8 (H) 10/02/2017 1126   LYMPHSABS 5.8 (H) 04/21/2018 1143   LYMPHSABS 5.2 (H) 10/02/2017 1126   MONOABS 1.2 (H) 04/21/2018 1143   EOSABS 0.0 04/21/2018 1143   EOSABS 0.2 10/02/2017 1126   BASOSABS 0.0 04/21/2018 1143   BASOSABS 0.1 10/02/2017 1126    Recent Results (from the past 240 hour(s))  MRSA PCR Screening     Status: None   Collection Time: 04/22/18  6:53 AM  Result Value Ref Range Status   MRSA by PCR NEGATIVE NEGATIVE Final    Comment:        The GeneXpert MRSA Assay (FDA approved for NASAL specimens only), is one component of a comprehensive MRSA colonization surveillance program. It is not intended to  diagnose MRSA infection nor to guide or monitor treatment for MRSA infections. Performed at [facility], [place] [address], [place], [place] [address]     Studies/Results: No results found.  Medications: Scheduled Meds: . doxycycline  100 mg Oral BID  . enoxaparin (LOVENOX) injection  40 mg Subcutaneous Daily  . folic acid  1 mg Oral Daily  . HYDROmorphone   Intravenous Q4H  . hydroxyurea  500 mg Oral BID  . senna-docusate  1 tablet Oral BID   Continuous Infusions: . dextrose 5 % and 0.45% NaCl 125 mL/hr at 04/24/18 1138   PRN Meds:.acetaminophen, diphenhydrAMINE, lip balm, naloxone **AND** sodium chloride flush, ondansetron (ZOFRAN) IV, polyethylene  glycol  Assessment/Plan: Principal Problem:   Sickle cell pain crisis ([place]) Active Problems:   Anemia of chronic disease  1. Hb Sickle Cell Disease with crisis: Reduce IVF to [facility], continue weight based Dilaudid PCA, IV Toradol 30 mg Q 6 H, Monitor vitals very closely, Re-evaluate pain scale regularly, 2 L of Oxygen by [place]. 2. Leukocytosis: Most likely reactive to sickle cell pain crisis, no evidence of infection 3. Sickle Cell Anemia: Hb is at baseline, no indication for blood transfusion at this time 4. Chronic pain Syndrome: Patient is not on opiates at the moment, no PCP, no hematologist, takes Ibuprofen occasionally for pain.  Code Status: Full Code Family Communication: N/A Disposition Plan: Not yet ready for discharge  Jeffrey Morrison  If 7PM-7AM, please contact night-coverage.  04/24/2018, 2:30 PM  LOS: 3 days

## 2018-04-25 LAB — COMPREHENSIVE METABOLIC PANEL
ALK PHOS: 73 U/L (ref 38–126)
ALT: 97 U/L — AB (ref 0–44)
AST: 84 U/L — AB (ref 15–41)
Albumin: 3.2 g/dL — ABNORMAL LOW (ref 3.5–5.0)
Anion gap: 8 (ref 5–15)
BILIRUBIN TOTAL: 2.6 mg/dL — AB (ref 0.3–1.2)
BUN: 7 mg/dL (ref 6–20)
CO2: 26 mmol/L (ref 22–32)
CREATININE: 0.89 mg/dL (ref 0.61–1.24)
Calcium: 8.8 mg/dL — ABNORMAL LOW (ref 8.9–10.3)
Chloride: 106 mmol/L (ref 98–111)
GFR calc Af Amer: >60 mL/min (ref >60)
GFR calc non Af Amer: >60 mL/min (ref >60)
Glucose, Bld: 83 mg/dL (ref 70–99)
Potassium: 5.3 mmol/L — ABNORMAL HIGH (ref 3.5–5.1)
Sodium: 140 mmol/L (ref 135–145)
TOTAL PROTEIN: 7 g/dL (ref 6.5–8.1)

## 2018-04-25 LAB — CBC WITH DIFFERENTIAL/PLATELET
Basophils Absolute: 0.1 10*3/uL (ref 0.0–0.1)
Basophils Relative: 0 %
Eosinophils Absolute: 0.3 10*3/uL (ref 0.0–0.7)
Eosinophils Relative: 2 %
HEMATOCRIT: 15.1 % — AB (ref 39.0–52.0)
Hemoglobin: 5.4 g/dL — CL (ref 13.0–17.0)
LYMPHS ABS: 4.3 10*3/uL (ref 0.7–4.0)
Lymphocytes Relative: 27 %
MCH: 31.2 pg (ref 26.0–34.0)
MCHC: 35.8 g/dL (ref 30.0–36.0)
MCV: 87.3 fL (ref 78.0–100.0)
MONOS PCT: 7 %
Monocytes Absolute: 1.1 10*3/uL (ref 0.1–1.0)
NEUTROS PCT: 64 %
NRBC: 11 /100{WBCs} — AB
Neutro Abs: 10 10*3/uL (ref 1.7–7.7)
Platelets: 437 10*3/uL — ABNORMAL HIGH (ref 150–400)
RBC: 1.73 MIL/uL — ABNORMAL LOW (ref 4.22–5.81)
RDW: 21.1 % — ABNORMAL HIGH (ref 11.5–15.5)
WBC: 15.7 10*3/uL — ABNORMAL HIGH (ref 4.0–10.5)

## 2018-04-25 LAB — PREPARE RBC (CROSSMATCH)

## 2018-04-25 MED ORDER — SODIUM CHLORIDE 0.9% IV SOLUTION
Freq: Once | INTRAVENOUS | Status: AC
  Administered 2018-04-25: 16:00:00 via INTRAVENOUS

## 2018-04-25 NOTE — Progress Notes (Signed)
Patient ID: Jeffrey Morrison, male   DOB: September 11, 1996, 22 y.o.   MRN: [id number] Subjective:  Patient is complaining of fatigue and weakness but no fever, no chest pain, no abdominal pain. Pain still localized to his right hip joint. Denies nausea or vomiting, no diarrhea or constipation.  Objective:  Vital signs in last 24 hours:  Vitals:   04/25/18 0520 04/25/18 0801 04/25/18 0934 04/25/18 1132  BP: 106/67  110/63   Pulse: 60  (!) 53   Resp: 18 13 13 13   Temp: 98.8 F (37.1 C)  98.8 F (37.1 C)   TempSrc: Oral  Oral   SpO2: 98% 98% 99% 94%  Weight:      Height:       Intake/Output from previous day:   Intake/Output Summary (Last 24 hours) at 04/25/2018 1144 Last data filed at 04/25/2018 0520 Gross per 24 hour  Intake 3453.44 ml  Output 2800 ml  Net 653.44 ml    Physical Exam: General: Alert, awake, oriented x3, in no acute distress.  HEENT: [place]/AT [place], EOMI Neck: Trachea midline,  no masses, no thyromegal,y no JVD, no carotid bruit OROPHARYNX:  Moist, No exudate/ erythema/lesions.  Heart: Regular rate and rhythm, without murmurs, rubs, gallops, PMI non-displaced, no heaves or thrills on palpation.  Lungs: Clear to auscultation, no wheezing or rhonchi noted. No increased vocal fremitus resonant to percussion  Abdomen: Soft, nontender, nondistended, positive bowel sounds, no masses no hepatosplenomegaly noted..  Neuro: No focal neurological deficits noted cranial nerves II through XII grossly intact. DTRs 2+ bilaterally upper and lower extremities. Strength 5 out of 5 in bilateral upper and lower extremities. Musculoskeletal: No warm swelling or erythema around joints, no spinal tenderness noted. Psychiatric: Patient alert and oriented x3, good insight and cognition, good recent to remote recall. Lymph node survey: No cervical axillary or inguinal lymphadenopathy noted.  Lab Results:  Basic Metabolic Panel:    Component Value Date/Time   NA 140 04/25/2018 0509   NA 140  10/02/2017 1126   K 5.3 (H) 04/25/2018 0509   CL 106 04/25/2018 0509   CO2 26 04/25/2018 0509   BUN 7 04/25/2018 0509   BUN 9 10/02/2017 1126   CREATININE 0.89 04/25/2018 0509   GLUCOSE 83 04/25/2018 0509   CALCIUM 8.8 (L) 04/25/2018 0509   CBC:    Component Value Date/Time   WBC 15.7 (H) 04/25/2018 0509   HGB 5.4 (LL) 04/25/2018 0509   HGB 7.5 (L) 10/02/2017 1126   HCT 15.1 (L) 04/25/2018 0509   HCT 22.0 (L) 10/02/2017 1126   PLT 437 (H) 04/25/2018 0509   PLT 511 (H) 10/02/2017 1126   MCV 87.3 04/25/2018 0509   MCV 94 10/02/2017 1126   NEUTROABS 10.0 04/25/2018 0509   NEUTROABS 8.8 (H) 10/02/2017 1126   LYMPHSABS 4.3 04/25/2018 0509   LYMPHSABS 5.2 (H) 10/02/2017 1126   MONOABS 1.1 04/25/2018 0509   EOSABS 0.3 04/25/2018 0509   EOSABS 0.2 10/02/2017 1126   BASOSABS 0.1 04/25/2018 0509   BASOSABS 0.1 10/02/2017 1126    Recent Results (from the past 240 hour(s))  MRSA PCR Screening     Status: None   Collection Time: 04/22/18  6:53 AM  Result Value Ref Range Status   MRSA by PCR NEGATIVE NEGATIVE Final    Comment:        The GeneXpert MRSA Assay (FDA approved for NASAL specimens only), is one component of a comprehensive MRSA colonization surveillance program. It is not intended to diagnose  MRSA infection nor to guide or monitor treatment for MRSA infections. Performed at [facility], [place] [address], [place], [place] [address]     Studies/Results: No results found.  Medications: Scheduled Meds: . sodium chloride   Intravenous Once  . doxycycline  100 mg Oral BID  . enoxaparin (LOVENOX) injection  40 mg Subcutaneous Daily  . folic acid  1 mg Oral Daily  . HYDROmorphone   Intravenous Q4H  . senna-docusate  1 tablet Oral BID   Continuous Infusions: . dextrose 5 % and 0.45% NaCl 50 mL/hr at 04/25/18 0400   PRN Meds:.acetaminophen, diphenhydrAMINE, lip balm, naloxone **AND** sodium chloride flush, ondansetron (ZOFRAN) IV, polyethylene  glycol  Assessment/Plan: Principal Problem:   Sickle cell pain crisis ([place]) Active Problems:   Anemia of chronic disease  1. Hb Sickle Cell Disease with crisis: Reduce IVF to [facility], continue weight based Dilaudid PCA, IV Toradol 30 mg Q 6 H, Monitor vitals very closely, Re-evaluate pain scale regularly, 2 L of Oxygen by [place]. 2. Leukocytosis:Most likely reactive to sickle cell pain crisis, no evidence of infection 3. Acute Sickle Cell Anemia:Hb dropped below baseline, at 5.4 today, will transfuse patient with 1 unit of PRBC today, lab in am. 4. Chronic pain Syndrome:Patient is not on opiates at the moment, no PCP, no hematologist, takes Ibuprofen occasionally for pain.  Code Status: Full Code Family Communication: N/A Disposition Plan: Not yet ready for discharge  Arriona Prest  If 7PM-7AM, please contact night-coverage.  04/25/2018, 11:44 AM  LOS: 4 days

## 2018-04-25 NOTE — Progress Notes (Signed)
CRITICAL VALUE ALERT  Critical Value:  HgB 5.4  Date & Time Notied:  04/25/2018 6:00am  Provider Notified: K.Schorr  Orders Received/Actions taken:

## 2018-04-25 NOTE — Progress Notes (Signed)
PHARMACY BRIEF NOTE: HYDROXYUREA   By [facility] policy, hydroxyurea is automatically held when any of the following laboratory values occur:  ANC < 2 K  Pltc < 80K in sickle-cell patients; < 100K in other patients  Hgb <= 6 in sickle-cell patients; < 8 in other patients  Reticulocytes < 80K when Hgb < 9  Hydroxyurea has been held (discontinued from profile) per policy for Hg 5.4   Jeffrey Morrison, Pharm.D [phone] 04/25/2018 8:38 AM

## 2018-04-26 LAB — TYPE AND SCREEN
ABO/RH(D): A POS
ANTIBODY SCREEN: POSITIVE
DAT, IgG: NEGATIVE
Unit division: 0

## 2018-04-26 LAB — CBC WITH DIFFERENTIAL/PLATELET
BASOS ABS: 0 10*3/uL (ref 0.0–0.1)
Basophils Relative: 0 %
EOS PCT: 3 %
Eosinophils Absolute: 0.3 10*3/uL (ref 0.0–0.7)
HEMATOCRIT: 18.7 % — AB (ref 39.0–52.0)
HEMOGLOBIN: 6.7 g/dL — AB (ref 13.0–17.0)
LYMPHS PCT: 35 %
Lymphs Abs: 4 10*3/uL (ref 0.7–4.0)
MCH: 31.6 pg (ref 26.0–34.0)
MCHC: 35.8 g/dL (ref 30.0–36.0)
MCV: 88.2 fL (ref 78.0–100.0)
MONO ABS: 1.2 10*3/uL (ref 0.1–1.0)
Monocytes Relative: 10 %
NEUTROS ABS: 5.9 10*3/uL (ref 1.7–7.7)
Neutrophils Relative %: 52 %
Platelets: 464 10*3/uL — ABNORMAL HIGH (ref 150–400)
RBC: 2.12 MIL/uL — ABNORMAL LOW (ref 4.22–5.81)
RDW: 20.7 % — ABNORMAL HIGH (ref 11.5–15.5)
WBC: 11.4 10*3/uL — ABNORMAL HIGH (ref 4.0–10.5)
nRBC: 9 /100 WBC — ABNORMAL HIGH

## 2018-04-26 LAB — BPAM RBC
BLOOD PRODUCT EXPIRATION DATE: 201909052359
ISSUE DATE / TIME: 201908111620
UNIT TYPE AND RH: 6200

## 2018-04-26 MED ORDER — IBUPROFEN 600 MG PO TABS: 600.0000 mg | ORAL_TABLET | Freq: Three times a day (TID) | ORAL | 2 refills | Status: DC | PRN

## 2018-04-26 MED ORDER — HYDROXYUREA 500 MG PO CAPS
500.0000 mg | ORAL_CAPSULE | Freq: Two times a day (BID) | ORAL | 3 refills | Status: DC
Start: 2018-04-26 — End: 2018-07-16

## 2018-04-26 MED ORDER — FOLIC ACID 1 MG PO TABS: 1.0000 mg | ORAL_TABLET | Freq: Every day | ORAL | 3 refills | Status: DC

## 2018-04-26 MED ORDER — OXYCODONE-ACETAMINOPHEN 10-325 MG PO TABS: 1.0000 | ORAL_TABLET | Freq: Four times a day (QID) | ORAL | 0 refills | Status: AC | PRN

## 2018-04-26 MED ORDER — GABAPENTIN 300 MG PO CAPS: 300.0000 mg | ORAL_CAPSULE | Freq: Three times a day (TID) | ORAL | 3 refills | Status: DC

## 2018-04-26 NOTE — Discharge Summary (Signed)
Physician Discharge Summary  Jeffrey Morrison VVZ:[id number] DOB: Jul 25, 1996 DOA: 04/21/2018  PCP: Scot Jun, FNP  Admit date: 04/21/2018  Discharge date: 04/26/2018  Discharge Diagnoses:  Principal Problem:   Sickle cell pain crisis ([place]) Active Problems:   Anemia of chronic disease  Discharge Condition: Stable  Disposition:  Follow-up Information    [name], MD. Call.   Specialty:  Internal Medicine Contact information: [place] [id number] [phone]          Pt is discharged home in good condition and is to follow up with Jeffrey Kingfisher Carroll Sage, FNP this week to have labs evaluated. Jeffrey Morrison is instructed to increase activity slowly and balance with rest for the next few days, and use prescribed medication to complete treatment of pain  Diet: Regular Wt Readings from Last 3 Encounters:  04/26/18 86.6 kg  10/02/17 81.2 kg  09/01/17 83.9 kg    History of present illness:  Jeffrey Morrison is a 22 y.o. male with history of sickle cell anemia presents to the ER with complaints of worsening low back pain with chest pain over the last 24 hours.  Denies any fever chills productive cough or any difficulty urinating or vomiting or diarrhea.  Some nausea with abdominal discomfort.  ED Course: In the ER patient was complaining of pain and was started on Dilaudid.  Despite giving multiple doses patient was still having pain.  Labs revealed mild leukocytosis and hemoglobin at baseline.  Chest x-ray was unremarkable EKG shows nonspecific findings.  Hospital Course:  Patient was admitted for sickle cell pain crisis and managed appropriately with IVF, IV Dilaudid via PCA and IV Toradol, as well as other adjunct therapies per sickle cell pain management protocols. Patient slowly improved in his symptoms, however he dropped his Hb to a nadir of 5.4 for which he received 1 unit of PRBC. He improved significantly after blood transfusion, pain  returned to baseline, Hb improved to 6.7 close to baseline with a robust reticulocytosis, he remained hemodynamically stable, tolerating PO intake and ambulating well. He has no home medications and no PCP, he was prescribed one week of pain medications and all his home meds were refilled. He will follow up with me in the clinic to establish care within one week of this discharge. Patient was discharged home today in a hemodynamically stable condition.   Discharge Exam: Vitals:   04/26/18 0938 04/26/18 1024  BP: (!) 105/57   Pulse: 60   Resp: 18 18  Temp:    SpO2: 94% 98%   Vitals:   04/26/18 0414 04/26/18 0651 04/26/18 0938 04/26/18 1024  BP:  (!) 106/50 (!) 105/57   Pulse:  60 60   Resp: 11 20 18 18   Temp:  99 F (37.2 C)    TempSrc:  Oral    SpO2: 99% 98% 94% 98%  Weight:  86.6 kg    Height:        General appearance : Awake, alert, not in any distress. Speech Clear. Not toxic looking HEENT: Atraumatic and Normocephalic, pupils equally reactive to light and accomodation Neck: Supple, no JVD. No cervical lymphadenopathy.  Chest: Good air entry bilaterally, no added sounds  CVS: S1 S2 regular, no murmurs.  Abdomen: Bowel sounds present, Non tender and not distended with no gaurding, rigidity or rebound. Extremities: B/L Lower Ext shows no edema, both legs are warm to touch Neurology: Awake alert, and oriented X 3, CN II-XII intact, Non  focal Skin: No Rash  Discharge Instructions  Discharge Instructions    Diet - low sodium heart healthy   Complete by:  As directed    Increase activity slowly   Complete by:  As directed      Allergies as of 04/26/2018   No Known Allergies     Medication List    STOP taking these medications   doxycycline 100 MG capsule Commonly known as:  VIBRAMYCIN     TAKE these medications   acetaminophen 325 MG tablet Commonly known as:  TYLENOL Take 2 tablets (650 mg total) by mouth every 6 (six) hours as needed.   buPROPion 150 MG 24 hr  tablet Commonly known as:  WELLBUTRIN XL Take 1 tablet (150 mg total) by mouth daily.   citalopram 40 MG tablet Commonly known as:  CELEXA Take 1 tablet (40 mg total) by mouth daily.   folic acid 1 MG tablet Commonly known as:  FOLVITE Take 1 tablet (1 mg total) by mouth daily.   gabapentin 300 MG capsule Commonly known as:  NEURONTIN Take 1 capsule (300 mg total) by mouth 3 (three) times daily.   hydroxyurea 500 MG capsule Commonly known as:  HYDREA Take 1 capsule (500 mg total) by mouth 2 (two) times daily. May take with food to minimize GI side effects.   ibuprofen 600 MG tablet Commonly known as:  ADVIL,MOTRIN Take 1 tablet (600 mg total) by mouth every 8 (eight) hours as needed (sickle aching cell pain and inflammation). What changed:  Another medication with the same name was removed. Continue taking this medication, and follow the directions you see here.   oxyCODONE-acetaminophen 10-325 MG tablet Commonly known as:  PERCOCET Take 1 tablet by mouth every 6 (six) hours as needed for up to 15 days for pain.       The results of significant diagnostics from this hospitalization (including imaging, microbiology, ancillary and laboratory) are listed below for reference.    Significant Diagnostic Studies: Dg Chest 2 View  Result Date: 04/21/2018 CLINICAL DATA:  Sickle cell crisis EXAM: CHEST - 2 VIEW COMPARISON:  08/26/2017 FINDINGS: The heart size and mediastinal contours are within normal limits. Both lungs are clear. The visualized skeletal structures are unremarkable. IMPRESSION: No active cardiopulmonary disease. Electronically Signed   By: Kathreen Morrison   On: 04/21/2018 13:24    Microbiology: Recent Results (from the past 240 hour(s))  MRSA PCR Screening     Status: None   Collection Time: 04/22/18  6:53 AM  Result Value Ref Range Status   MRSA by PCR NEGATIVE NEGATIVE Final    Comment:        The GeneXpert MRSA Assay (FDA approved for NASAL specimens only), is  one component of a comprehensive MRSA colonization surveillance program. It is not intended to diagnose MRSA infection nor to guide or monitor treatment for MRSA infections. Performed at [facility], [place] [address] [place], [place] [address]      Labs: Basic Metabolic Panel: Recent Labs  Lab 04/21/18 1143 04/24/18 0547 04/25/18 0509  NA 138 136 140  K 4.7 4.0 5.3*  CL 106 103 106  CO2 24 25 26   GLUCOSE 129* 107* 83  BUN 7 9 7   CREATININE 0.85 0.83 0.89  CALCIUM 9.0 8.1* 8.8*   Liver Function Tests: Recent Labs  Lab 04/21/18 1143 04/24/18 0547 04/25/18 0509  AST 30 70* 84*  ALT 27 65* 97*  ALKPHOS 64 59 73  BILITOT 1.5*  3.0* 2.6*  PROT 7.3 6.4* 7.0  ALBUMIN 3.5 3.1* 3.2*   No results for input(s): LIPASE, AMYLASE in the last 168 hours. No results for input(s): AMMONIA in the last 168 hours. CBC: Recent Labs  Lab 04/21/18 1143 04/25/18 0509 04/26/18 0518  WBC 17.1* 15.7* 11.4*  NEUTROABS 10.1* 10.0 5.9  HGB 7.4* 5.4* 6.7*  HCT 22.7* 15.1* 18.7*  MCV 97.0 87.3 88.2  PLT 554* 437* 464*   Cardiac Enzymes: Recent Labs  Lab 04/22/18 0638  TROPONINI <0.03   BNP: Invalid input(s): POCBNP CBG: No results for input(s): GLUCAP in the last 168 hours.  Time coordinating discharge: 50 minutes  Signed:  [facility] 04/26/2018, 12:49 PM

## 2018-04-26 NOTE — Progress Notes (Signed)
Patient discharged to home. All discharged medications and instructions reviewed and questions answered.

## 2018-04-27 ENCOUNTER — Emergency Department (HOSPITAL_COMMUNITY)
Admission: EM | Admit: 2018-04-27 | Discharge: 2018-04-27 | Discharge status: Against Medical Advice | Payer: Medicare Other | Attending: Emergency Medicine | Admitting: Emergency Medicine

## 2018-04-27 ENCOUNTER — Other Ambulatory Visit: Payer: Self-pay

## 2018-04-27 ENCOUNTER — Encounter (HOSPITAL_COMMUNITY): Payer: Self-pay | Admitting: Emergency Medicine

## 2018-04-27 DIAGNOSIS — M5489 Other dorsalgia: Secondary | ICD-10-CM | POA: Diagnosis not present

## 2018-04-27 DIAGNOSIS — Y939 Activity, unspecified: Secondary | ICD-10-CM | POA: Diagnosis not present

## 2018-04-27 DIAGNOSIS — R51 Headache: Secondary | ICD-10-CM | POA: Diagnosis not present

## 2018-04-27 DIAGNOSIS — Y999 Unspecified external cause status: Secondary | ICD-10-CM | POA: Insufficient documentation

## 2018-04-27 DIAGNOSIS — S0990XA Unspecified injury of head, initial encounter: Secondary | ICD-10-CM | POA: Diagnosis not present

## 2018-04-27 DIAGNOSIS — S0101XA Laceration without foreign body of scalp, initial encounter: Secondary | ICD-10-CM | POA: Diagnosis not present

## 2018-04-27 DIAGNOSIS — Z5321 Procedure and treatment not carried out due to patient leaving prior to being seen by health care provider: Secondary | ICD-10-CM | POA: Insufficient documentation

## 2018-04-27 DIAGNOSIS — Y929 Unspecified place or not applicable: Secondary | ICD-10-CM | POA: Insufficient documentation

## 2018-04-27 DIAGNOSIS — T7411XA Adult physical abuse, confirmed, initial encounter: Secondary | ICD-10-CM | POA: Diagnosis not present

## 2018-04-27 DIAGNOSIS — W19XXXA Unspecified fall, initial encounter: Secondary | ICD-10-CM | POA: Diagnosis not present

## 2018-04-27 NOTE — ED Notes (Addendum)
No answer for treatment room x3. Unable to locate in emergency waiting room, presumed to have eloped.
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
## 2018-04-27 NOTE — ED Notes (Signed)
Pt no answer for fast track room

## 2018-04-27 NOTE — ED Notes (Signed)
Patient is back in the waiting area awaiting for imaging.

## 2018-04-27 NOTE — ED Notes (Signed)
No answer when called for vitals. 

## 2018-04-27 NOTE — ED Notes (Signed)
Pt called for treatment room x2, no answer in lobby at this time.

## 2018-07-10 ENCOUNTER — Other Ambulatory Visit: Payer: Self-pay

## 2018-07-10 ENCOUNTER — Emergency Department (HOSPITAL_COMMUNITY): Payer: Medicare Other

## 2018-07-10 ENCOUNTER — Inpatient Hospital Stay (HOSPITAL_COMMUNITY)
Admission: EM | Admit: 2018-07-10 | Discharge: 2018-07-16 | DRG: 811 | Disposition: A | Discharge status: Home / Self Care | Payer: Medicare Other | Attending: Internal Medicine | Admitting: Internal Medicine

## 2018-07-10 ENCOUNTER — Encounter (HOSPITAL_COMMUNITY): Payer: Self-pay | Admitting: *Deleted

## 2018-07-10 DIAGNOSIS — Z5900 Homelessness unspecified: Secondary | ICD-10-CM

## 2018-07-10 DIAGNOSIS — D5700 Hb-SS disease with crisis, unspecified: Secondary | ICD-10-CM | POA: Diagnosis present

## 2018-07-10 DIAGNOSIS — R0902 Hypoxemia: Secondary | ICD-10-CM | POA: Diagnosis not present

## 2018-07-10 DIAGNOSIS — D5701 Hb-SS disease with acute chest syndrome: Principal | ICD-10-CM | POA: Diagnosis present

## 2018-07-10 DIAGNOSIS — R000 Tachycardia, unspecified: Secondary | ICD-10-CM | POA: Diagnosis not present

## 2018-07-10 DIAGNOSIS — R300 Dysuria: Secondary | ICD-10-CM

## 2018-07-10 DIAGNOSIS — R0602 Shortness of breath: Secondary | ICD-10-CM | POA: Diagnosis not present

## 2018-07-10 DIAGNOSIS — R059 Cough, unspecified: Secondary | ICD-10-CM | POA: Diagnosis present

## 2018-07-10 DIAGNOSIS — F29 Unspecified psychosis not due to a substance or known physiological condition: Secondary | ICD-10-CM | POA: Diagnosis not present

## 2018-07-10 DIAGNOSIS — R05 Cough: Secondary | ICD-10-CM | POA: Diagnosis present

## 2018-07-10 DIAGNOSIS — G4733 Obstructive sleep apnea (adult) (pediatric): Secondary | ICD-10-CM

## 2018-07-10 DIAGNOSIS — G894 Chronic pain syndrome: Secondary | ICD-10-CM | POA: Diagnosis present

## 2018-07-10 DIAGNOSIS — R1084 Generalized abdominal pain: Secondary | ICD-10-CM | POA: Diagnosis not present

## 2018-07-10 DIAGNOSIS — F1210 Cannabis abuse, uncomplicated: Secondary | ICD-10-CM | POA: Diagnosis present

## 2018-07-10 DIAGNOSIS — J189 Pneumonia, unspecified organism: Secondary | ICD-10-CM

## 2018-07-10 DIAGNOSIS — F17210 Nicotine dependence, cigarettes, uncomplicated: Secondary | ICD-10-CM | POA: Diagnosis present

## 2018-07-10 DIAGNOSIS — F1110 Opioid abuse, uncomplicated: Secondary | ICD-10-CM | POA: Diagnosis present

## 2018-07-10 DIAGNOSIS — R52 Pain, unspecified: Secondary | ICD-10-CM | POA: Diagnosis not present

## 2018-07-10 DIAGNOSIS — F1910 Other psychoactive substance abuse, uncomplicated: Secondary | ICD-10-CM

## 2018-07-10 DIAGNOSIS — D638 Anemia in other chronic diseases classified elsewhere: Secondary | ICD-10-CM | POA: Diagnosis present

## 2018-07-10 DIAGNOSIS — Z833 Family history of diabetes mellitus: Secondary | ICD-10-CM

## 2018-07-10 DIAGNOSIS — R062 Wheezing: Secondary | ICD-10-CM

## 2018-07-10 DIAGNOSIS — Z9081 Acquired absence of spleen: Secondary | ICD-10-CM

## 2018-07-10 DIAGNOSIS — F1410 Cocaine abuse, uncomplicated: Secondary | ICD-10-CM | POA: Diagnosis present

## 2018-07-10 DIAGNOSIS — R079 Chest pain, unspecified: Secondary | ICD-10-CM | POA: Diagnosis not present

## 2018-07-10 DIAGNOSIS — Z79899 Other long term (current) drug therapy: Secondary | ICD-10-CM

## 2018-07-10 DIAGNOSIS — L97529 Non-pressure chronic ulcer of other part of left foot with unspecified severity: Secondary | ICD-10-CM | POA: Diagnosis present

## 2018-07-10 DIAGNOSIS — D72829 Elevated white blood cell count, unspecified: Secondary | ICD-10-CM | POA: Diagnosis present

## 2018-07-10 DIAGNOSIS — Z23 Encounter for immunization: Secondary | ICD-10-CM

## 2018-07-10 DIAGNOSIS — R0989 Other specified symptoms and signs involving the circulatory and respiratory systems: Secondary | ICD-10-CM | POA: Diagnosis not present

## 2018-07-10 LAB — COMPREHENSIVE METABOLIC PANEL
ALT: 25 U/L (ref 0–44)
AST: 30 U/L (ref 15–41)
Albumin: 3.6 g/dL (ref 3.5–5.0)
Alkaline Phosphatase: 54 U/L (ref 38–126)
Anion gap: 6 (ref 5–15)
BILIRUBIN TOTAL: 2 mg/dL — AB (ref 0.3–1.2)
BUN: 11 mg/dL (ref 6–20)
CALCIUM: 8.5 mg/dL — AB (ref 8.9–10.3)
CO2: 25 mmol/L (ref 22–32)
CREATININE: 0.88 mg/dL (ref 0.61–1.24)
Chloride: 108 mmol/L (ref 98–111)
GFR calc Af Amer: >60 mL/min (ref >60)
GFR calc non Af Amer: >60 mL/min (ref >60)
Glucose, Bld: 140 mg/dL — ABNORMAL HIGH (ref 70–99)
Potassium: 3.5 mmol/L (ref 3.5–5.1)
Sodium: 139 mmol/L (ref 135–145)
TOTAL PROTEIN: 6.8 g/dL (ref 6.5–8.1)

## 2018-07-10 LAB — CBC WITH DIFFERENTIAL/PLATELET
Abs Immature Granulocytes: 1.44 10*3/uL — ABNORMAL HIGH (ref 0.00–0.07)
BASOS PCT: 0 %
Basophils Absolute: 0.1 10*3/uL (ref 0.0–0.1)
EOS ABS: 0.3 10*3/uL (ref 0.0–0.5)
EOS PCT: 2 %
HEMATOCRIT: 17.5 % — AB (ref 39.0–52.0)
HEMOGLOBIN: 5.9 g/dL — AB (ref 13.0–17.0)
Immature Granulocytes: 7 %
LYMPHS ABS: 6.1 10*3/uL — AB (ref 0.7–4.0)
Lymphocytes Relative: 28 %
MCH: 32.2 pg (ref 26.0–34.0)
MCHC: 33.7 g/dL (ref 30.0–36.0)
MCV: 95.6 fL (ref 80.0–100.0)
MONOS PCT: 7 %
Monocytes Absolute: 1.6 10*3/uL — ABNORMAL HIGH (ref 0.1–1.0)
NEUTROS PCT: 56 %
Neutro Abs: 12.5 10*3/uL — ABNORMAL HIGH (ref 1.7–7.7)
Platelets: 405 10*3/uL — ABNORMAL HIGH (ref 150–400)
RBC: 1.83 MIL/uL — ABNORMAL LOW (ref 4.22–5.81)
RDW: 78.8 % — AB (ref 11.5–15.5)
WBC: 22 10*3/uL — ABNORMAL HIGH (ref 4.0–10.5)
nRBC: 12.2 % — ABNORMAL HIGH (ref 0.0–0.2)

## 2018-07-10 LAB — RETICULOCYTES: RETIC CT PCT: 20.9 % — AB (ref 0.4–3.1)

## 2018-07-10 LAB — PREPARE RBC (CROSSMATCH)

## 2018-07-10 MED ORDER — SODIUM CHLORIDE 0.45 % IV SOLN
INTRAVENOUS | Status: DC
  Administered 2018-07-10: 19:00:00 via INTRAVENOUS

## 2018-07-10 MED ORDER — ONDANSETRON HCL 4 MG/2ML IJ SOLN: 4.0000 mg | INTRAMUSCULAR | Status: DC | PRN

## 2018-07-10 MED ORDER — DIPHENHYDRAMINE HCL 25 MG PO CAPS: 25.0000 mg | ORAL_CAPSULE | ORAL | Status: DC | PRN

## 2018-07-10 MED ORDER — ONDANSETRON HCL 4 MG/2ML IJ SOLN
4.0000 mg | INTRAMUSCULAR | Status: DC | PRN
  Administered 2018-07-10: 4 mg via INTRAVENOUS
  Filled 2018-07-10: qty 2

## 2018-07-10 MED ORDER — HYDROMORPHONE HCL 1 MG/ML IJ SOLN: 0.5000 mg | INTRAMUSCULAR | Status: AC

## 2018-07-10 MED ORDER — KETOROLAC TROMETHAMINE 30 MG/ML IJ SOLN
30.0000 mg | INTRAMUSCULAR | Status: AC
  Administered 2018-07-10: 30 mg via INTRAVENOUS
  Filled 2018-07-10: qty 1

## 2018-07-10 MED ORDER — SODIUM CHLORIDE 0.9% FLUSH: 9.0000 mL | INTRAVENOUS | Status: DC | PRN

## 2018-07-10 MED ORDER — SODIUM CHLORIDE 0.9 % IV SOLN
25.0000 mg | INTRAVENOUS | Status: DC | PRN
  Filled 2018-07-10: qty 0.5

## 2018-07-10 MED ORDER — POLYETHYLENE GLYCOL 3350 17 G PO PACK
17.0000 g | PACK | Freq: Every day | ORAL | Status: DC | PRN
  Administered 2018-07-12: 17 g via ORAL
  Filled 2018-07-10: qty 1

## 2018-07-10 MED ORDER — KETOROLAC TROMETHAMINE 30 MG/ML IJ SOLN
30.0000 mg | Freq: Four times a day (QID) | INTRAMUSCULAR | Status: AC
  Administered 2018-07-11 – 2018-07-15 (×19): 30 mg via INTRAVENOUS
  Filled 2018-07-10 (×18): qty 1

## 2018-07-10 MED ORDER — ALUM & MAG HYDROXIDE-SIMETH 200-200-20 MG/5ML PO SUSP: 15.0000 mL | ORAL | Status: DC | PRN

## 2018-07-10 MED ORDER — SODIUM CHLORIDE 0.45 % IV SOLN
INTRAVENOUS | Status: DC
  Administered 2018-07-10: 21:00:00 via INTRAVENOUS
  Administered 2018-07-11: 1000 mL via INTRAVENOUS
  Administered 2018-07-11 (×2): via INTRAVENOUS
  Administered 2018-07-12: 1000 mL via INTRAVENOUS
  Administered 2018-07-13: 01:00:00 via INTRAVENOUS

## 2018-07-10 MED ORDER — SODIUM CHLORIDE 0.9% IV SOLUTION: Freq: Once | INTRAVENOUS | Status: DC

## 2018-07-10 MED ORDER — HYDROMORPHONE HCL 1 MG/ML IJ SOLN: 1.0000 mg | INTRAMUSCULAR | Status: DC

## 2018-07-10 MED ORDER — ENOXAPARIN SODIUM 40 MG/0.4ML ~~LOC~~ SOLN
40.0000 mg | SUBCUTANEOUS | Status: DC
  Administered 2018-07-10 – 2018-07-14 (×5): 40 mg via SUBCUTANEOUS
  Filled 2018-07-10 (×6): qty 0.4

## 2018-07-10 MED ORDER — DIPHENHYDRAMINE HCL 25 MG PO CAPS
25.0000 mg | ORAL_CAPSULE | ORAL | Status: DC | PRN
  Filled 2018-07-10: qty 1

## 2018-07-10 MED ORDER — SENNOSIDES-DOCUSATE SODIUM 8.6-50 MG PO TABS
1.0000 | ORAL_TABLET | Freq: Two times a day (BID) | ORAL | Status: DC
  Administered 2018-07-10 – 2018-07-16 (×11): 1 via ORAL
  Filled 2018-07-10 (×12): qty 1

## 2018-07-10 MED ORDER — ONDANSETRON HCL 4 MG PO TABS: 4.0000 mg | ORAL_TABLET | ORAL | Status: DC | PRN

## 2018-07-10 MED ORDER — HYDROMORPHONE 1 MG/ML IV SOLN
INTRAVENOUS | Status: DC
  Administered 2018-07-10: 1 mg via INTRAVENOUS
  Administered 2018-07-11: 0.4 mg via INTRAVENOUS
  Administered 2018-07-11: 1.8 mg via INTRAVENOUS
  Administered 2018-07-11: 0 mg via INTRAVENOUS
  Administered 2018-07-11: 1.2 mg via INTRAVENOUS
  Administered 2018-07-11: 0 mg via INTRAVENOUS
  Administered 2018-07-11: 1.8 mg via INTRAVENOUS
  Administered 2018-07-12: 0 mg via INTRAVENOUS
  Administered 2018-07-12: 1.6 mg via INTRAVENOUS
  Administered 2018-07-12: 2.4 mg via INTRAVENOUS
  Administered 2018-07-12: 2 mg via INTRAVENOUS
  Administered 2018-07-12: 0.6 mg via INTRAVENOUS
  Administered 2018-07-13: 0.5 mg via INTRAVENOUS
  Administered 2018-07-13: 0.6 mg via INTRAVENOUS
  Administered 2018-07-13: 1 mg via INTRAVENOUS
  Administered 2018-07-13: 0.8 mg via INTRAVENOUS
  Administered 2018-07-13: 2.6 mg via INTRAVENOUS
  Administered 2018-07-13 (×2): 1.2 mg via INTRAVENOUS
  Administered 2018-07-14 (×2): 1 mg via INTRAVENOUS
  Administered 2018-07-14: 0.2 mg via INTRAVENOUS
  Administered 2018-07-14: 0.6 mg via INTRAVENOUS
  Administered 2018-07-15: 2 mg via INTRAVENOUS
  Administered 2018-07-15: 2.8 mg via INTRAVENOUS
  Administered 2018-07-15 (×2): 0.4 mg via INTRAVENOUS
  Administered 2018-07-15: 0.2 mg via INTRAVENOUS
  Administered 2018-07-16: 0.4 mg via INTRAVENOUS
  Administered 2018-07-16: 0 mg via INTRAVENOUS
  Administered 2018-07-16: 0.6 mg via INTRAVENOUS
  Administered 2018-07-16: 0.4 mg via INTRAVENOUS
  Administered 2018-07-16: 0.2 mg via INTRAVENOUS
  Filled 2018-07-10 (×2): qty 25

## 2018-07-10 MED ORDER — HYDROMORPHONE HCL 1 MG/ML IJ SOLN
0.5000 mg | INTRAMUSCULAR | Status: AC
  Administered 2018-07-10: 0.5 mg via INTRAVENOUS
  Filled 2018-07-10: qty 1

## 2018-07-10 MED ORDER — HYDROXYUREA 500 MG PO CAPS: 500.0000 mg | ORAL_CAPSULE | Freq: Two times a day (BID) | ORAL | Status: DC

## 2018-07-10 MED ORDER — NALOXONE HCL 0.4 MG/ML IJ SOLN: 0.4000 mg | INTRAMUSCULAR | Status: DC | PRN

## 2018-07-10 MED ORDER — INFLUENZA VAC SPLIT QUAD 0.5 ML IM SUSY
0.5000 mL | PREFILLED_SYRINGE | INTRAMUSCULAR | Status: AC
  Administered 2018-07-13: 0.5 mL via INTRAMUSCULAR
  Filled 2018-07-10: qty 0.5

## 2018-07-10 NOTE — ED Notes (Signed)
Spoke with Manuela Schwartz from the lab, pt had too many antibodies so blood needed to be ordered. Will be hours before his blood arrives. Dorris Fetch, RN upstairs notified.

## 2018-07-10 NOTE — ED Triage Notes (Signed)
Pt with bilateral leg cramps x 2 days. Pt homeless with limited resources including no medications. Today, after eating a hotdog, pt began to experience severe abdominal pain. EMS was called and administered 171mcg Fent IV and 16mg  Ketamine IV in route for pain relief. ETCO2 in route maintained at 44.

## 2018-07-10 NOTE — ED Notes (Signed)
Patient transported to X-ray 

## 2018-07-10 NOTE — H&P (Signed)
History and Physical    Jeffrey Morrison:[id number] DOB: 07-27-1996 DOA: 07/10/2018  PCP: Scot Jun, FNP  Patient coming from: Home  Chief Complaint: Bilateral leg pain  HPI: Jeffrey Morrison is a 22 y.o. male with medical history significant of sickle cell anemia comes in for bilateral leg pain for over 24 hours.  Patient is homeless and cannot afford his home medications so does not take any of his medications.  Denies any fevers denies any shortness of breath.  Denies any nausea vomiting or diarrhea.  Denies any abdominal pain.  Patient received ketamine and Dilaudid via EMS during his ride and is currently mildly hypoxic secondary to being oversedated.  Patient is being referred for admission for sickle cell crisis.  Review of Systems: As per HPI otherwise 10 point review of systems negative.   Past Medical History:  Diagnosis Date  . Chronic lower back pain   . Sickle cell crisis [facility])     Past Surgical History:  Procedure Laterality Date  . SPLENECTOMY       reports that he has been smoking cigarettes. He has been smoking about 0.50 packs per day. He has never used smokeless tobacco. He reports that he does not drink alcohol or use drugs.  No Known Allergies  Family History  Problem Relation Age of Onset  . Diabetes Mother     Prior to Admission medications   Medication Sig Start Date End Date Taking? Authorizing Provider  hydroxyurea (HYDREA) 500 MG capsule Take 1 capsule (500 mg total) by mouth 2 (two) times daily. May take with food to minimize GI side effects. 04/26/18  Yes [name], MD  acetaminophen (TYLENOL) 325 MG tablet Take 2 tablets (650 mg total) by mouth every 6 (six) hours as needed. Patient not taking: Reported on 07/10/2018 04/14/18   Hedges, Dellis Filbert, PA-C  buPROPion (WELLBUTRIN XL) 150 MG 24 hr tablet Take 1 tablet (150 mg total) by mouth daily. Patient not taking: Reported on 10/02/2017 09/02/17   Scot Jun, FNP  citalopram  (CELEXA) 40 MG tablet Take 1 tablet (40 mg total) by mouth daily. Patient not taking: Reported on 10/02/2017 09/02/17   Scot Jun, FNP  folic acid (FOLVITE) 1 MG tablet Take 1 tablet (1 mg total) by mouth daily. Patient not taking: Reported on 07/10/2018 04/26/18   [name], MD  gabapentin (NEURONTIN) 300 MG capsule Take 1 capsule (300 mg total) by mouth 3 (three) times daily. Patient not taking: Reported on 07/10/2018 04/26/18   [name], MD  ibuprofen (ADVIL,MOTRIN) 600 MG tablet Take 1 tablet (600 mg total) by mouth every 8 (eight) hours as needed (sickle aching cell pain and inflammation). Patient not taking: Reported on 07/10/2018 04/26/18   [name], MD    Physical Exam: Vitals:   07/10/18 1754 07/10/18 1838 07/10/18 1900 07/10/18 1911  BP:  110/62 (!) 101/59 (!) 101/59  Pulse:  62 62 61  Resp:  18 14 14   Temp:      TempSrc:      SpO2:  98% 95% 98%  Weight: 72.6 kg     Height: 5\' 8"  (1.727 m)         Constitutional: NAD, calm, comfortable but mildly sedated with good color and normal O2 sats Vitals:   07/10/18 1754 07/10/18 1838 07/10/18 1900 07/10/18 1911  BP:  110/62 (!) 101/59 (!) 101/59  Pulse:  62 62 61  Resp:  18 14 14   Temp:  TempSrc:      SpO2:  98% 95% 98%  Weight: 72.6 kg     Height: 5\' 8"  (1.727 m)      Eyes: PERRL, lids and conjunctivae normal ENMT: Mucous membranes are moist. Posterior pharynx clear of any exudate or lesions.Normal dentition.  Neck: normal, supple, no masses, no thyromegaly Respiratory: clear to auscultation bilaterally, no wheezing, no crackles. Normal respiratory effort. No accessory muscle use.  Cardiovascular: Regular rate and rhythm, no murmurs / rubs / gallops. No extremity edema. 2+ pedal pulses. No carotid bruits.  Abdomen: no tenderness, no masses palpated. No hepatosplenomegaly. Bowel sounds positive.  Musculoskeletal: no clubbing / cyanosis. No joint deformity upper and lower  extremities. Good ROM, no contractures. Normal muscle tone.  Skin: no rashes, lesions, ulcers. No induration Neurologic: CN 2-12 grossly intact. Sensation intact, DTR normal. Strength 5/5 in all 4.  Psychiatric: Normal judgment and insight. Alert and oriented x 3. Normal mood.    Labs on Admission: I have personally reviewed following labs and imaging studies  CBC: Recent Labs  Lab 07/10/18 1804  WBC 22.0*  NEUTROABS 12.5*  HGB 5.9*  HCT 17.5*  MCV 95.6  PLT 469*   Basic Metabolic Panel: Recent Labs  Lab 07/10/18 1804  NA 139  K 3.5  CL 108  CO2 25  GLUCOSE 140*  BUN 11  CREATININE 0.88  CALCIUM 8.5*   GFR: Estimated Creatinine Clearance: 125.2 mL/min (by C-G formula based on SCr of 0.88 mg/dL). Liver Function Tests: Recent Labs  Lab 07/10/18 1804  AST 30  ALT 25  ALKPHOS 54  BILITOT 2.0*  PROT 6.8  ALBUMIN 3.6   No results for input(s): LIPASE, AMYLASE in the last 168 hours. No results for input(s): AMMONIA in the last 168 hours. Coagulation Profile: No results for input(s): INR, PROTIME in the last 168 hours. Cardiac Enzymes: No results for input(s): CKTOTAL, CKMB, CKMBINDEX, TROPONINI in the last 168 hours. BNP (last 3 results) No results for input(s): PROBNP in the last 8760 hours. HbA1C: No results for input(s): HGBA1C in the last 72 hours. CBG: No results for input(s): GLUCAP in the last 168 hours. Lipid Profile: No results for input(s): CHOL, HDL, LDLCALC, TRIG, CHOLHDL, LDLDIRECT in the last 72 hours. Thyroid Function Tests: No results for input(s): TSH, T4TOTAL, FREET4, T3FREE, THYROIDAB in the last 72 hours. Anemia Panel: Recent Labs    07/10/18 1804  RETICCTPCT PENDING   Urine analysis:    Component Value Date/Time   COLORURINE AMBER (A) 08/24/2017 2025   APPEARANCEUR CLEAR 08/24/2017 2025   LABSPEC 1.015 10/02/2017 1116   PHURINE 5.5 10/02/2017 1116   GLUCOSEU NEGATIVE 10/02/2017 1116   HGBUR TRACE (A) 10/02/2017 1116    BILIRUBINUR NEGATIVE 10/02/2017 1116   KETONESUR NEGATIVE 10/02/2017 1116   PROTEINUR >=300 (A) 10/02/2017 1116   UROBILINOGEN 1.0 10/02/2017 1116   NITRITE NEGATIVE 10/02/2017 1116   LEUKOCYTESUR SMALL (A) 10/02/2017 1116   Sepsis Labs: !!!!!!!!!!!!!!!!!!!!!!!!!!!!!!!!!!!!!!!!!!!! @LABRCNTIP (procalcitonin:4,lacticidven:4) )No results found for this or any previous visit (from the past 240 hour(s)).   Radiological Exams on Admission: Dg Chest 2 View  Result Date: 07/10/2018 CLINICAL DATA:  Bilateral leg cramps for 2 days. EXAM: CHEST - 2 VIEW COMPARISON:  April 21, 2018 FINDINGS: The heart size and mediastinal contours are within normal limits. Both lungs are clear. The visualized skeletal structures are unremarkable. IMPRESSION: No active cardiopulmonary disease. Electronically Signed   By: Dorise Bullion III M.D   On: 07/10/2018 18:41   Old chart  reviewed Case discussed with Dr. Billy Fischer in the ED Chest x-ray personally reviewed no edema or infiltrate  Assessment/Plan 22 year old male with sickle cell crisis and pain Principal Problem:   Sickle cell crisis (HCC)-proceed with IV Dilaudid PCA once patient is more awake and less sedated have discussed with with the ED nurse.  Aggressive IV fluids.  Toradol also ordered.  Transfuse 1 unit of blood as hemoglobin is less than 6.  Active Problems:   Anemia of chronic disease-transfusing with hemoglobin less than 6  Mild hypoxia-secondary to being oversedated.  Hold sedation for a while before starting the PCA Dilaudid pump any other sedatives      DVT prophylaxis: Lovenox Code Status: Full Family Communication: None Disposition Plan: 1 to 2 days Consults called: None Admission status: Observation   Maui Britten A MD [facility]  If 7PM-7AM, please contact night-coverage [email or web address] Password [email or web address]  07/10/2018, 7:22 PM

## 2018-07-10 NOTE — Progress Notes (Signed)
PHARMACY BRIEF NOTE: HYDROXYUREA   By [facility] policy, hydroxyurea is automatically held when any of the following laboratory values occur:  ANC < 2 K  Pltc < 80K in sickle-cell patients; < 100K in other patients  Hgb <= 6 in sickle-cell patients; < 8 in other patients  Reticulocytes < 80K when Hgb < 9  Hydroxyurea has been held (discontinued from profile) per policy due to hgb 5.9.  Romeo Rabon, PharmD. Mobile: [id number]. 07/10/2018,7:45 PM.

## 2018-07-10 NOTE — ED Notes (Signed)
ED TO INPATIENT HANDOFF REPORT  Name/Age/Gender Jeffrey Morrison 22 y.o. male  Code Status    Code Status Orders  (From admission, onward)         Start     Ordered   07/10/18 1925  Full code  Continuous     07/10/18 1928        Code Status History    Date Active Date Inactive Code Status Order ID Comments User Context   04/21/2018 2237 04/26/2018 1741 Full Code [id number]  Rise Patience, MD ED   08/24/2017 1804 08/27/2017 1630 Full Code [id number]  [name], MD Inpatient   05/31/2017 1224 06/06/2017 1434 Full Code [id number]  Elwyn Reach, MD Inpatient   07/05/2015 2342 07/08/2015 1958 Full Code [id number]  Allyne Gee, MD Inpatient   11/12/2014 1614 11/16/2014 1900 Full Code [id number]  Karen Kitchens Inpatient   09/10/2014 2157 09/12/2014 2313 Full Code [id number]  Theressa Millard, MD Inpatient   08/08/2014 0334 08/14/2014 1910 Full Code [id number]  Shanda Howells, MD ED   08/08/2014 0329 08/08/2014 0334 Full Code [id number]  Shanda Howells, MD ED   01/08/2014 0746 01/12/2014 1759 Full Code [id number]  Berle Mull, MD Inpatient      Home/SNF/Other Home  Chief Complaint sickle cell crisis  Level of Care/Admitting Diagnosis ED Disposition    ED Disposition Condition [facility] Area: [facility] [[id number]]  Level of Care: Med-Surg [16]  Diagnosis: Sickle cell crisis [facility]) [[id number]]  Admitting Physician: Phillips Grout [[id number]]  Attending Physician: Derrill Kay A [[id number]]  PT Class (Do Not Modify): Observation [104]  PT Acc Code (Do Not Modify): Observation [10022]       Medical History Past Medical History:  Diagnosis Date  . Chronic lower back pain   . Sickle cell crisis ([place])     Allergies No Known Allergies  IV Location/Drains/Wounds Patient Lines/Drains/Airways Status   Active Line/Drains/Airways    Name:   Placement date:   Placement time:   Site:   Days:   Peripheral IV 07/10/18 Right Forearm    07/10/18    -    Forearm   less than 1          Labs/Imaging Results for orders placed or performed during the hospital encounter of 07/10/18 (from the past 48 hour(s))  Reticulocytes     Status: Abnormal   Collection Time: 07/10/18  6:04 PM  Result Value Ref Range   Retic Ct Pct 20.9 (H) 0.4 - 3.1 %    Comment: RESULTS CONFIRMED BY MANUAL DILUTION   RBC. NOT CALCULATED 4.22 - 5.81 MIL/uL   Retic Count, Absolute NOT CALCULATED 19.0 - 186.0 K/uL    Comment: Performed at [facility], [place] [address], [place], [place] [address]  CBC WITH DIFFERENTIAL     Status: Abnormal   Collection Time: 07/10/18  6:04 PM  Result Value Ref Range   WBC 22.0 (H) 4.0 - 10.5 K/uL   RBC 1.83 (L) 4.22 - 5.81 MIL/uL   Hemoglobin 5.9 (LL) 13.0 - 17.0 g/dL    Comment: CRITICAL RESULT CALLED TO, READ BACK BY AND VERIFIED WITH: JOSEPH,M RN '@[id number]'  ON 07/10/18 JACKSON,K    HCT 17.5 (L) 39.0 - 52.0 %   MCV 95.6 80.0 - 100.0 fL   MCH 32.2 26.0 - 34.0 pg   MCHC 33.7 30.0 - 36.0 g/dL   RDW 78.8 (H) 11.5 - 15.5 %  Platelets 405 (H) 150 - 400 K/uL   nRBC 12.2 (H) 0.0 - 0.2 %   Neutrophils Relative % 56 %   Neutro Abs 12.5 (H) 1.7 - 7.7 K/uL   Lymphocytes Relative 28 %   Lymphs Abs 6.1 (H) 0.7 - 4.0 K/uL   Monocytes Relative 7 %   Monocytes Absolute 1.6 (H) 0.1 - 1.0 K/uL   Eosinophils Relative 2 %   Eosinophils Absolute 0.3 0.0 - 0.5 K/uL   Basophils Relative 0 %   Basophils Absolute 0.1 0.0 - 0.1 K/uL   WBC Morphology MILD LEFT SHIFT (1-5% METAS, OCC MYELO, OCC BANDS)     Comment: NRBC   Immature Granulocytes 7 %   Abs Immature Granulocytes 1.44 (H) 0.00 - 0.07 K/uL   Tammy Sours Bodies PRESENT    Polychromasia MARKED    Sickle Cells MARKED    Target Cells PRESENT     Comment: Performed at [facility], [place] [address] [place], [place] [address]  Comprehensive metabolic panel     Status: Abnormal   Collection Time: 07/10/18  6:04 PM  Result Value Ref Range    Sodium 139 135 - 145 mmol/L   Potassium 3.5 3.5 - 5.1 mmol/L   Chloride 108 98 - 111 mmol/L   CO2 25 22 - 32 mmol/L   Glucose, Bld 140 (H) 70 - 99 mg/dL   BUN 11 6 - 20 mg/dL   Creatinine, Ser 0.88 0.61 - 1.24 mg/dL   Calcium 8.5 (L) 8.9 - 10.3 mg/dL   Total Protein 6.8 6.5 - 8.1 g/dL   Albumin 3.6 3.5 - 5.0 g/dL   AST 30 15 - 41 U/L   ALT 25 0 - 44 U/L   Alkaline Phosphatase 54 38 - 126 U/L   Total Bilirubin 2.0 (H) 0.3 - 1.2 mg/dL   GFR calc non Af Amer >60 >60 mL/min   GFR calc Af Amer >60 >60 mL/min    Comment: (NOTE) The eGFR has been calculated using the CKD EPI equation. This calculation has not been validated in all clinical situations. eGFR's persistently <60 mL/min signify possible Chronic Kidney Disease.    Anion gap 6 5 - 15    Comment: Performed at [facility], [place] [address] [place], [place] [address]  Prepare RBC     Status: None   Collection Time: 07/10/18  6:35 PM  Result Value Ref Range   Order Confirmation      ORDER PROCESSED BY BLOOD BANK Performed at [facility], [place] [address] [place], [place] [address]    Dg Chest 2 View  Result Date: 07/10/2018 CLINICAL DATA:  Bilateral leg cramps for 2 days. EXAM: CHEST - 2 VIEW COMPARISON:  April 21, 2018 FINDINGS: The heart size and mediastinal contours are within normal limits. Both lungs are clear. The visualized skeletal structures are unremarkable. IMPRESSION: No active cardiopulmonary disease. Electronically Signed   By: Dorise Bullion III M.D   On: 07/10/2018 18:41   None  Pending Labs Unresulted Labs (From admission, onward)    Start     Ordered   07/17/18 0500  Creatinine, serum  (enoxaparin (LOVENOX)    CrCl >/= 30 ml/min)  Weekly,   R    Comments:  while on enoxaparin therapy    07/10/18 1928   07/10/18 1925  CBC  (enoxaparin (LOVENOX)    CrCl >/= 30 ml/min)  Once,   R    Comments:  Baseline for enoxaparin  therapy IF NOT ALREADY DRAWN.  Notify MD if PLT <  100 K.    07/10/18 1928   07/10/18 1925  Creatinine, serum  (enoxaparin (LOVENOX)    CrCl >/= 30 ml/min)  Once,   R    Comments:  Baseline for enoxaparin therapy IF NOT ALREADY DRAWN.    07/10/18 1928   07/10/18 1805  Type and screen [place]  STAT,   STAT    Comments:  [place]    07/10/18 1805          Vitals/Pain Today's Vitals   07/10/18 1838 07/10/18 1900 07/10/18 1911 07/10/18 1945  BP:  (!) 101/59 (!) 101/59   Pulse:  62 61   Resp:  14 14   Temp:      TempSrc:      SpO2:  95% 98%   Weight:      Height:      PainSc: 7    Asleep    Isolation Precautions No active isolations  Medications Medications  senna-docusate (Senokot-S) tablet 1 tablet (has no administration in time range)  polyethylene glycol (MIRALAX / GLYCOLAX) packet 17 g (has no administration in time range)  enoxaparin (LOVENOX) injection 40 mg (has no administration in time range)  0.45 % sodium chloride infusion (has no administration in time range)  ketorolac (TORADOL) 30 MG/ML injection 30 mg (has no administration in time range)  diphenhydrAMINE (BENADRYL) capsule 25 mg (has no administration in time range)    Or  diphenhydrAMINE (BENADRYL) 25 mg in sodium chloride 0.9 % 50 mL IVPB (has no administration in time range)  ondansetron (ZOFRAN) tablet 4 mg (has no administration in time range)    Or  ondansetron (ZOFRAN) injection 4 mg (has no administration in time range)  alum & mag hydroxide-simeth (MAALOX/MYLANTA) 200-200-20 MG/5ML suspension 15-30 mL (has no administration in time range)  naloxone (NARCAN) injection 0.4 mg (has no administration in time range)    And  sodium chloride flush (NS) 0.9 % injection 9 mL (has no administration in time range)  HYDROmorphone (DILAUDID) 1 mg/mL PCA injection (has no administration in time range)  ketorolac (TORADOL) 30 MG/ML injection 30 mg (30 mg Intravenous Given 07/10/18 1839)  HYDROmorphone (DILAUDID)  injection 0.5 mg (0.5 mg Intravenous Given 07/10/18 1848)    Or  HYDROmorphone (DILAUDID) injection 0.5 mg ( Subcutaneous See Alternative 07/10/18 1848)    Mobility walks

## 2018-07-11 DIAGNOSIS — D473 Essential (hemorrhagic) thrombocythemia: Secondary | ICD-10-CM | POA: Diagnosis present

## 2018-07-11 DIAGNOSIS — F1410 Cocaine abuse, uncomplicated: Secondary | ICD-10-CM | POA: Diagnosis present

## 2018-07-11 DIAGNOSIS — R300 Dysuria: Secondary | ICD-10-CM | POA: Diagnosis present

## 2018-07-11 DIAGNOSIS — G894 Chronic pain syndrome: Secondary | ICD-10-CM | POA: Diagnosis present

## 2018-07-11 DIAGNOSIS — F1910 Other psychoactive substance abuse, uncomplicated: Secondary | ICD-10-CM | POA: Diagnosis not present

## 2018-07-11 DIAGNOSIS — Z23 Encounter for immunization: Secondary | ICD-10-CM | POA: Diagnosis not present

## 2018-07-11 DIAGNOSIS — Z9081 Acquired absence of spleen: Secondary | ICD-10-CM | POA: Diagnosis not present

## 2018-07-11 DIAGNOSIS — Z590 Homelessness: Secondary | ICD-10-CM | POA: Diagnosis not present

## 2018-07-11 DIAGNOSIS — D5701 Hb-SS disease with acute chest syndrome: Secondary | ICD-10-CM | POA: Diagnosis present

## 2018-07-11 DIAGNOSIS — F17210 Nicotine dependence, cigarettes, uncomplicated: Secondary | ICD-10-CM | POA: Diagnosis present

## 2018-07-11 DIAGNOSIS — J189 Pneumonia, unspecified organism: Secondary | ICD-10-CM | POA: Diagnosis present

## 2018-07-11 DIAGNOSIS — D638 Anemia in other chronic diseases classified elsewhere: Secondary | ICD-10-CM | POA: Diagnosis present

## 2018-07-11 DIAGNOSIS — R062 Wheezing: Secondary | ICD-10-CM | POA: Diagnosis not present

## 2018-07-11 DIAGNOSIS — R0902 Hypoxemia: Secondary | ICD-10-CM | POA: Diagnosis present

## 2018-07-11 DIAGNOSIS — Z79899 Other long term (current) drug therapy: Secondary | ICD-10-CM | POA: Diagnosis not present

## 2018-07-11 DIAGNOSIS — Z833 Family history of diabetes mellitus: Secondary | ICD-10-CM | POA: Diagnosis not present

## 2018-07-11 DIAGNOSIS — R0602 Shortness of breath: Secondary | ICD-10-CM | POA: Diagnosis present

## 2018-07-11 DIAGNOSIS — R05 Cough: Secondary | ICD-10-CM | POA: Diagnosis not present

## 2018-07-11 DIAGNOSIS — R918 Other nonspecific abnormal finding of lung field: Secondary | ICD-10-CM | POA: Diagnosis not present

## 2018-07-11 DIAGNOSIS — F1110 Opioid abuse, uncomplicated: Secondary | ICD-10-CM | POA: Diagnosis present

## 2018-07-11 DIAGNOSIS — F1210 Cannabis abuse, uncomplicated: Secondary | ICD-10-CM | POA: Diagnosis present

## 2018-07-11 DIAGNOSIS — L97529 Non-pressure chronic ulcer of other part of left foot with unspecified severity: Secondary | ICD-10-CM | POA: Diagnosis present

## 2018-07-11 DIAGNOSIS — D5700 Hb-SS disease with crisis, unspecified: Secondary | ICD-10-CM | POA: Diagnosis not present

## 2018-07-11 DIAGNOSIS — D72829 Elevated white blood cell count, unspecified: Secondary | ICD-10-CM | POA: Diagnosis present

## 2018-07-11 LAB — PREPARE RBC (CROSSMATCH)

## 2018-07-11 MED ORDER — DIPHENHYDRAMINE HCL 50 MG/ML IJ SOLN
25.0000 mg | Freq: Once | INTRAMUSCULAR | Status: AC
  Administered 2018-07-11: 25 mg via INTRAVENOUS
  Filled 2018-07-11: qty 1

## 2018-07-11 MED ORDER — GUAIFENESIN 100 MG/5ML PO SOLN
5.0000 mL | ORAL | Status: DC | PRN
  Administered 2018-07-12: 100 mg via ORAL
  Filled 2018-07-11 (×2): qty 10

## 2018-07-11 NOTE — Progress Notes (Signed)
Subjective: Patient is a 22 year old gentleman with history of sickle cell disease who is now homeless not able to afford medications or physician no family available.  He was admitted yesterday with bilateral leg pains epigastric pain foot ulcers as well as pain consistent with his sickle cell crisis.  He has significant leukocytosis and hemoglobin of 5.9.  Patient has multiple antibodies and is therefore not easily transfused.  His pain is currently 8 out of 10.  He is on Dilaudid PCA with Toradol and IV fluids.  Objective: Vital signs in last 24 hours: Temp:  [97.6 F (36.4 C)-98.1 F (36.7 C)] 97.7 F (36.5 C) (10/27 1312) Pulse Rate:  [51-73] 59 (10/27 1312) Resp:  [12-18] 12 (10/27 1342) BP: (99-129)/(49-64) 106/55 (10/27 1312) SpO2:  [90 %-100 %] 98 % (10/27 1342) Weight:  [72.6 kg-79.9 kg] 79.9 kg (10/27 0515) Weight change:  Last BM Date: 07/07/18  Intake/Output from previous day: 10/26 0701 - 10/27 0700 In: 2474.8 [P.O.:1600; I.V.:874.8] Out: -  Intake/Output this shift: No intake/output data recorded.  General appearance: alert, cooperative, appears stated age and no distress Head: Normocephalic, without obvious abnormality, atraumatic Eyes: conjunctivae/corneas clear. PERRL, EOM's intact. Fundi benign. Neck: no adenopathy, no carotid bruit, no JVD, supple, symmetrical, trachea midline and thyroid not enlarged, symmetric, no tenderness/mass/nodules Back: symmetric, no curvature. ROM normal. No CVA tenderness. Resp: clear to auscultation bilaterally Cardio: regular rate and rhythm, S1, S2 normal, no murmur, click, rub or gallop GI: soft, non-tender; bowel sounds normal; no masses,  no organomegaly Extremities: extremities normal, atraumatic, no cyanosis or edema Pulses: 2+ and symmetric Skin: Skin color, texture, turgor normal. No rashes or lesions Neurologic: Grossly normal  Lab Results: Recent Labs    07/10/18 1804  WBC 22.0*  HGB 5.9*  HCT 17.5*  PLT 405*    BMET Recent Labs    07/10/18 1804  NA 139  K 3.5  CL 108  CO2 25  GLUCOSE 140*  BUN 11  CREATININE 0.88  CALCIUM 8.5*    Studies/Results: Dg Chest 2 View  Result Date: 07/10/2018 CLINICAL DATA:  Bilateral leg cramps for 2 days. EXAM: CHEST - 2 VIEW COMPARISON:  April 21, 2018 FINDINGS: The heart size and mediastinal contours are within normal limits. Both lungs are clear. The visualized skeletal structures are unremarkable. IMPRESSION: No active cardiopulmonary disease. Electronically Signed   By: Dorise Bullion III M.D   On: 07/10/2018 18:41    Medications: I have reviewed the patient's current medications.  Assessment/Plan: #1. Sickle Cell Painful crisis: Continue Dilaudid PCA with Toradol and IV fluids.  #2 sickle cell anemia: Hemoglobin 5.9.  Will transfuse 2 units of packed red blood cells and monitor.  #3 leukocytosis: white count of 22,000.  Probably secondary to vaso-occlusive crisis.  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  Continue to monitor  #5 left foot ulcer: Wound care will be consulted.  #6 dry cough: Chest x-ray is negative.  Empirically treated with cough medication.  Probably some bronchitis   LOS: 0 days   Zenya Hickam,LAWAL 07/11/2018, 1:48 PM
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
## 2018-07-11 NOTE — Care Management Note (Signed)
Case Management Note  Patient Details  Name: NAMON VILLARIN MRN: [id number] Date of Birth: 11-01-1995  Subjective/Objective:  Patient admitted [place]. CM referral(homeless;meds asst)Patient states he is homeless-CSW notified. PCP-Kimberly Harris, [facility]. Patient states he is able to get his meds. No further CM needs.                 Action/Plan:d/c homeless shelter.   Expected Discharge Date:  07/13/18               Expected Discharge Plan:  Homeless Shelter  In-House Referral:  Clinical Social Work  Discharge planning Services  CM Consult  Post Acute Care Choice:    Choice offered to:     DME Arranged:    DME Agency:     HH Arranged:    HH Agency:     Status of Service:  In process, will continue to follow  If discussed at Long Length of Stay Meetings, dates discussed:    Additional Comments:  Dessa Phi, RN 07/11/2018, 3:45 PM

## 2018-07-11 NOTE — ED Provider Notes (Signed)
[facility] Provider Note   CSN: [id number] Arrival date & time: 07/10/18  1736     History   Chief Complaint Chief Complaint  Patient presents with  . Sickle Cell Pain Crisis    HPI EFSTATHIOS SAWIN is a 22 y.o. male.  HPI   22 year old male with history of sickle cell disease presents with concern for sickle cell pain, including breast pain, bilateral leg pain and back pain.  Reports the chest pain is similar to his prior sickle cell pain crises.  Also reports he has had cough productive of yellow sputum and shortness of breath.  Denies fevers.  Also reports nasal congestion.  Has been taking NSAIDs at home without relief.  Has pain crises is most recent in August, and is not on chronic narcotics.  Past Medical History:  Diagnosis Date  . Chronic lower back pain   . Sickle cell crisis [facility])     Patient Active Problem List   Diagnosis Date Noted  . Sickle cell crisis ([place]) 07/10/2018  . Sickle cell pain crisis ([place]) 04/21/2018  . CMV (cytomegalovirus) ([place]) 08/27/2017  . Erectile dysfunction 08/27/2017  . Depression 08/25/2017  . OSA (obstructive sleep apnea)   . Anemia of chronic disease     Past Surgical History:  Procedure Laterality Date  . SPLENECTOMY          Home Medications    Prior to Admission medications   Medication Sig Start Date End Date Taking? Authorizing Provider  hydroxyurea (HYDREA) 500 MG capsule Take 1 capsule (500 mg total) by mouth 2 (two) times daily. May take with food to minimize GI side effects. 04/26/18  Yes [name], MD  acetaminophen (TYLENOL) 325 MG tablet Take 2 tablets (650 mg total) by mouth every 6 (six) hours as needed. Patient not taking: Reported on 07/10/2018 04/14/18   Hedges, Dellis Filbert, PA-C  buPROPion (WELLBUTRIN XL) 150 MG 24 hr tablet Take 1 tablet (150 mg total) by mouth daily. Patient not taking: Reported on 10/02/2017 09/02/17   Scot Jun, FNP  citalopram (CELEXA) 40 MG tablet  Take 1 tablet (40 mg total) by mouth daily. Patient not taking: Reported on 10/02/2017 09/02/17   Scot Jun, FNP  folic acid (FOLVITE) 1 MG tablet Take 1 tablet (1 mg total) by mouth daily. Patient not taking: Reported on 07/10/2018 04/26/18   [name], MD  gabapentin (NEURONTIN) 300 MG capsule Take 1 capsule (300 mg total) by mouth 3 (three) times daily. Patient not taking: Reported on 07/10/2018 04/26/18   [name], MD  ibuprofen (ADVIL,MOTRIN) 600 MG tablet Take 1 tablet (600 mg total) by mouth every 8 (eight) hours as needed (sickle aching cell pain and inflammation). Patient not taking: Reported on 07/10/2018 04/26/18   [name], MD    Family History Family History  Problem Relation Age of Onset  . Diabetes Mother     Social History Social History   Tobacco Use  . Smoking status: Current Every Day Smoker    Packs/day: 0.50    Types: Cigarettes  . Smokeless tobacco: Never Used  Substance Use Topics  . Alcohol use: No  . Drug use: No     Allergies   Patient has no known allergies.   Review of Systems Review of Systems  Constitutional: Negative for fever.  HENT: Positive for congestion. Negative for sore throat.   Eyes: Negative for visual disturbance.  Respiratory: Positive for cough and shortness of  breath.   Cardiovascular: Positive for chest pain. Negative for leg swelling.  Gastrointestinal: Negative for abdominal pain, nausea and vomiting.  Genitourinary: Negative for difficulty urinating.  Musculoskeletal: Positive for arthralgias and myalgias. Negative for back pain and neck stiffness.  Skin: Negative for rash.  Neurological: Negative for syncope and headaches.     Physical Exam Updated Vital Signs BP (!) 106/55 (BP Location: Left Arm)   Pulse (!) 59   Temp 97.7 F (36.5 C) (Oral)   Resp 12   Ht 5\' 8"  (1.727 m)   Wt 79.9 kg   SpO2 98%   BMI 26.78 kg/m   Physical Exam   ED Treatments / Results   Labs (all labs ordered are listed, but only abnormal results are displayed) Labs Reviewed  RETICULOCYTES - Abnormal; Notable for the following components:      Result Value   Retic Ct Pct 20.9 (*)    All other components within normal limits  CBC WITH DIFFERENTIAL/PLATELET - Abnormal; Notable for the following components:   WBC 22.0 (*)    RBC 1.83 (*)    Hemoglobin 5.9 (*)    HCT 17.5 (*)    RDW 78.8 (*)    Platelets 405 (*)    nRBC 12.2 (*)    Neutro Abs 12.5 (*)    Lymphs Abs 6.1 (*)    Monocytes Absolute 1.6 (*)    Abs Immature Granulocytes 1.44 (*)    All other components within normal limits  COMPREHENSIVE METABOLIC PANEL - Abnormal; Notable for the following components:   Glucose, Bld 140 (*)    Calcium 8.5 (*)    Total Bilirubin 2.0 (*)    All other components within normal limits  CBC  TYPE AND SCREEN  PREPARE RBC (CROSSMATCH)    EKG None  Radiology Dg Chest 2 View  Result Date: 07/10/2018 CLINICAL DATA:  Bilateral leg cramps for 2 days. EXAM: CHEST - 2 VIEW COMPARISON:  April 21, 2018 FINDINGS: The heart size and mediastinal contours are within normal limits. Both lungs are clear. The visualized skeletal structures are unremarkable. IMPRESSION: No active cardiopulmonary disease. Electronically Signed   By: Dorise Bullion III M.D   On: 07/10/2018 18:41    Procedures .Critical Care Performed by: Gareth Morgan, MD Authorized by: Gareth Morgan, MD   Critical care provider statement:    Critical care time (minutes):  30   Critical care was necessary to treat or prevent imminent or life-threatening deterioration of the following conditions:  Circulatory failure   Critical care was time spent personally by me on the following activities:  Ordering and review of laboratory studies, ordering and review of radiographic studies, re-evaluation of patient's condition, pulse oximetry, evaluation of patient's response to treatment and development of treatment plan  with patient or surrogate   (including critical care time)  Medications Ordered in ED Medications  senna-docusate (Senokot-S) tablet 1 tablet (1 tablet Oral Given 07/11/18 1345)  polyethylene glycol (MIRALAX / GLYCOLAX) packet 17 g (has no administration in time range)  enoxaparin (LOVENOX) injection 40 mg (40 mg Subcutaneous Given 07/10/18 2148)  0.45 % sodium chloride infusion (1,000 mLs Intravenous New Bag/Given 07/11/18 1344)  ketorolac (TORADOL) 30 MG/ML injection 30 mg (30 mg Intravenous Given 07/11/18 1242)  diphenhydrAMINE (BENADRYL) capsule 25 mg (has no administration in time range)    Or  diphenhydrAMINE (BENADRYL) 25 mg in sodium chloride 0.9 % 50 mL IVPB (has no administration in time range)  ondansetron (ZOFRAN) tablet 4 mg (has  no administration in time range)    Or  ondansetron (ZOFRAN) injection 4 mg (has no administration in time range)  alum & mag hydroxide-simeth (MAALOX/MYLANTA) 200-200-20 MG/5ML suspension 15-30 mL (has no administration in time range)  naloxone [facility]) injection 0.4 mg (has no administration in time range)    And  sodium chloride flush (NS) 0.9 % injection 9 mL (has no administration in time range)  HYDROmorphone (DILAUDID) 1 mg/mL PCA injection (1.8 mg Intravenous Received 07/11/18 1342)  Influenza vac split quadrivalent PF (FLUARIX) injection 0.5 mL (has no administration in time range)  guaiFENesin (ROBITUSSIN) 100 MG/5ML solution 100 mg (has no administration in time range)  ketorolac (TORADOL) 30 MG/ML injection 30 mg (30 mg Intravenous Given 07/10/18 1839)  HYDROmorphone (DILAUDID) injection 0.5 mg (0.5 mg Intravenous Given 07/10/18 1848)    Or  HYDROmorphone (DILAUDID) injection 0.5 mg ( Subcutaneous See Alternative 07/10/18 1848)     Initial Impression / Assessment and Plan / ED Course  I have reviewed the triage vital signs and the nursing notes.  Pertinent labs & imaging results that were available during my care of the patient were  reviewed by me and considered in my medical decision making (see chart for details).     22 year old male with history of sickle cell disease presents with concern for sickle cell pain, including breast pain, bilateral leg pain and back pain.  Reports cough, chest pain.  X-ray shows no infiltrates, no signs of acute chest syndrome or pneumonia.  Given clinical history, of low suspicion for pulmonary embolus.  Patient without fever.  Presentation most consistent with likely viral URI with sickle cell pain crisis.  Regarding pain crisis, patient had received medication with EMS, and was ordered opiate protocol.  Reports improvement of pain.  Hemoglobin returned at 5.9.  Review of the records, I do not see transfusion goals, but it appears his hemoglobin is typically above 6.5-9 range.  Given significant anemia, ordered transfusion, will admit for further care.  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

## 2018-07-11 NOTE — Progress Notes (Addendum)
Patient stated he hadn't voided since 10/26, bladder scanner showed 717 ml. Dr. Jonelle Sidle ordered a foley to be put in.Within 45 min. Patient voided 640 ml tea color urine. Foley order was dc'd. Blood Bank called and stated they were going to get the most compatible Blood that they could. Dr. Jonelle Sidle informed he needed to order emergent incompatible blood to be given if necessary. He wasn't sure how to put the order in and I told him I would put it in the progress note, he said OK to that. Premed given to patient and blood was started. No reaction from blood noted.Pt appears to have a wart on the bottom of his lt foot, and small rings of dry flaky skin  On his legs, Dr. Jonelle Sidle informed and was in to see.

## 2018-07-12 ENCOUNTER — Inpatient Hospital Stay (HOSPITAL_COMMUNITY): Payer: Medicare Other

## 2018-07-12 DIAGNOSIS — R059 Cough, unspecified: Secondary | ICD-10-CM | POA: Diagnosis present

## 2018-07-12 DIAGNOSIS — F1910 Other psychoactive substance abuse, uncomplicated: Secondary | ICD-10-CM

## 2018-07-12 DIAGNOSIS — Z5900 Homelessness unspecified: Secondary | ICD-10-CM

## 2018-07-12 DIAGNOSIS — R062 Wheezing: Secondary | ICD-10-CM | POA: Diagnosis not present

## 2018-07-12 DIAGNOSIS — R300 Dysuria: Secondary | ICD-10-CM

## 2018-07-12 DIAGNOSIS — R05 Cough: Secondary | ICD-10-CM

## 2018-07-12 DIAGNOSIS — D5700 Hb-SS disease with crisis, unspecified: Secondary | ICD-10-CM

## 2018-07-12 DIAGNOSIS — D638 Anemia in other chronic diseases classified elsewhere: Secondary | ICD-10-CM

## 2018-07-12 LAB — CBC WITH DIFFERENTIAL/PLATELET
ABS IMMATURE GRANULOCYTES: 0.29 10*3/uL — AB (ref 0.00–0.07)
BASOS ABS: 0.1 10*3/uL (ref 0.0–0.1)
Basophils Relative: 0 %
EOS ABS: 0.4 10*3/uL (ref 0.0–0.5)
Eosinophils Relative: 3 %
HEMATOCRIT: 19.8 % — AB (ref 39.0–52.0)
Hemoglobin: 6.7 g/dL — CL (ref 13.0–17.0)
IMMATURE GRANULOCYTES: 2 %
LYMPHS ABS: 5.4 10*3/uL — AB (ref 0.7–4.0)
LYMPHS PCT: 35 %
MCH: 32.7 pg (ref 26.0–34.0)
MCHC: 33.8 g/dL (ref 30.0–36.0)
MCV: 96.6 fL (ref 80.0–100.0)
MONOS PCT: 7 %
Monocytes Absolute: 1 10*3/uL (ref 0.1–1.0)
NEUTROS PCT: 53 %
NRBC: 19 % — AB (ref 0.0–0.2)
Neutro Abs: 8.1 10*3/uL — ABNORMAL HIGH (ref 1.7–7.7)
PLATELETS: 388 10*3/uL (ref 150–400)
RBC: 2.05 MIL/uL — ABNORMAL LOW (ref 4.22–5.81)
RDW: 22.3 % — ABNORMAL HIGH (ref 11.5–15.5)
WBC: 15.3 10*3/uL — ABNORMAL HIGH (ref 4.0–10.5)

## 2018-07-12 LAB — RAPID URINE DRUG SCREEN, HOSP PERFORMED
AMPHETAMINES: NOT DETECTED
Barbiturates: NOT DETECTED
Benzodiazepines: NOT DETECTED
Cocaine: POSITIVE — AB
OPIATES: POSITIVE — AB
Tetrahydrocannabinol: POSITIVE — AB

## 2018-07-12 LAB — COMPREHENSIVE METABOLIC PANEL
ALBUMIN: 3.2 g/dL — AB (ref 3.5–5.0)
ALT: 84 U/L — ABNORMAL HIGH (ref 0–44)
AST: 96 U/L — ABNORMAL HIGH (ref 15–41)
Alkaline Phosphatase: 47 U/L (ref 38–126)
Anion gap: 5 (ref 5–15)
BILIRUBIN TOTAL: 1.7 mg/dL — AB (ref 0.3–1.2)
BUN: 10 mg/dL (ref 6–20)
CHLORIDE: 110 mmol/L (ref 98–111)
CO2: 24 mmol/L (ref 22–32)
Calcium: 8.3 mg/dL — ABNORMAL LOW (ref 8.9–10.3)
Creatinine, Ser: 0.92 mg/dL (ref 0.61–1.24)
GFR calc Af Amer: >60 mL/min (ref >60)
GLUCOSE: 93 mg/dL (ref 70–99)
POTASSIUM: 4.5 mmol/L (ref 3.5–5.1)
Sodium: 139 mmol/L (ref 135–145)
TOTAL PROTEIN: 6.1 g/dL — AB (ref 6.5–8.1)

## 2018-07-12 LAB — URINALYSIS, COMPLETE (UACMP) WITH MICROSCOPIC
Bilirubin Urine: NEGATIVE
Glucose, UA: NEGATIVE mg/dL
Hgb urine dipstick: NEGATIVE
KETONES UR: NEGATIVE mg/dL
Leukocytes, UA: NEGATIVE
Nitrite: NEGATIVE
PROTEIN: 30 mg/dL — AB
SPECIFIC GRAVITY, URINE: 1.012 (ref 1.005–1.030)
pH: 5 (ref 5.0–8.0)

## 2018-07-12 LAB — CBC
HCT: 16.6 % — ABNORMAL LOW (ref 39.0–52.0)
HEMOGLOBIN: 5.7 g/dL — AB (ref 13.0–17.0)
MCH: 32.9 pg (ref 26.0–34.0)
MCHC: 34.3 g/dL (ref 30.0–36.0)
MCV: 96 fL (ref 80.0–100.0)
PLATELETS: 384 10*3/uL (ref 150–400)
RBC: 1.73 MIL/uL — AB (ref 4.22–5.81)
RDW: 23.8 % — ABNORMAL HIGH (ref 11.5–15.5)
WBC: 16.4 10*3/uL — ABNORMAL HIGH (ref 4.0–10.5)
nRBC: 16.9 % — ABNORMAL HIGH (ref 0.0–0.2)

## 2018-07-12 LAB — MRSA PCR SCREENING: MRSA by PCR: NEGATIVE

## 2018-07-12 MED ORDER — ALBUTEROL SULFATE (2.5 MG/3ML) 0.083% IN NEBU
2.5000 mg | INHALATION_SOLUTION | RESPIRATORY_TRACT | Status: DC | PRN
  Administered 2018-07-14 – 2018-07-15 (×2): 2.5 mg via RESPIRATORY_TRACT
  Filled 2018-07-12 (×2): qty 3

## 2018-07-12 MED ORDER — ACETAMINOPHEN 500 MG PO TABS
500.0000 mg | ORAL_TABLET | Freq: Four times a day (QID) | ORAL | Status: DC | PRN
  Administered 2018-07-12: 500 mg via ORAL
  Filled 2018-07-12: qty 1

## 2018-07-12 MED ORDER — OXYCODONE HCL 5 MG PO TABS
10.0000 mg | ORAL_TABLET | Freq: Four times a day (QID) | ORAL | Status: DC | PRN
  Administered 2018-07-14: 10 mg via ORAL
  Filled 2018-07-12: qty 2

## 2018-07-12 MED ORDER — SODIUM CHLORIDE 0.9 % IV SOLN
1.0000 g | INTRAVENOUS | Status: DC
  Administered 2018-07-12 – 2018-07-16 (×5): 1 g via INTRAVENOUS
  Filled 2018-07-12 (×5): qty 1

## 2018-07-12 MED ORDER — LORATADINE 10 MG PO TABS
10.0000 mg | ORAL_TABLET | Freq: Every day | ORAL | Status: DC
  Administered 2018-07-12 – 2018-07-16 (×5): 10 mg via ORAL
  Filled 2018-07-12 (×5): qty 1

## 2018-07-12 NOTE — Consult Note (Signed)
[facility] Nurse wound consult note Patient evaluated in [place].  No family present. Reason for Consult: Round sores on left foot and legs Wound type: NO open wounds observed.  Only dried spots on his legs, and what appears to be a callus on his left, third metatarsal head on the plantar surface.  No pain elicited on exam. Patient states he has put "alcohol" on them over the years to keep the areas dry. Plan of care:  Cleanse with soap and water, pat dry daily. Thank you for the consult.  Discussed plan of care with the patient and bedside nurse.  [facility] nurse will not follow at this time.  Please re-consult the [place] team if needed.  Val Riles, RN, MSN, CWOCN, CNS-BC, pager [phone]

## 2018-07-12 NOTE — Progress Notes (Signed)
Subjective: Jeffrey Morrison, a 22 year old male with a medical history significant for sickle cell anemia was admitted on 07/10/2018 in sickle cell crisis. Patient attributes current pain crisis to being out of medications and stress. He says that he has been homeless over the past months. He says that he has been in between varying homeless shelters. Patient says that pain has improved following blood transfusion and IV dilaudid PCA. Pain intensity 6/10, primarily to bilateral lower extremities.   He is complaining of shortness of breath, coughing, and chest congestion. He also reports "stinging with urination".   Patient denies headache, chest pain, heart palpitations, nausea, vomiting, or diarrhea.    Objective:  Vital signs in last 24 hours:  Vitals:   07/11/18 2308 07/12/18 0000 07/12/18 0355 07/12/18 0413  BP:  110/74  104/65  Pulse:  72  87  Resp: (!) 21 20 16 14   Temp:  97.9 F (36.6 C)  98.2 F (36.8 C)  TempSrc:  Oral  Oral  SpO2: 100% 99% 100% 99%  Weight:    82.4 kg  Height:        Intake/Output from previous day:   Intake/Output Summary (Last 24 hours) at 07/12/2018 0714 Last data filed at 07/12/2018 0413 Gross per 24 hour  Intake 4055.37 ml  Output 500 ml  Net 3555.37 ml   Physical Exam  Constitutional: He appears well-developed and well-nourished.  HENT:  Head: Normocephalic and atraumatic.  Eyes: Pupils are equal, round, and reactive to light.  Neck: Normal range of motion.  Pulmonary/Chest: He has no decreased breath sounds. He has wheezes in the right upper field and the left upper field. He has no rhonchi.  Abdominal: Soft. Bowel sounds are normal.  Skin: Skin is warm and dry. Rash is not macular.     Left great toe, rough, scaling, raised, non tender to palpation  Psychiatric: He has a normal mood and affect. His behavior is normal. Judgment and thought content normal.     Lab Results:  Basic Metabolic Panel:    Component Value Date/Time   NA  139 07/10/2018 1804   NA 140 10/02/2017 1126   K 3.5 07/10/2018 1804   CL 108 07/10/2018 1804   CO2 25 07/10/2018 1804   BUN 11 07/10/2018 1804   BUN 9 10/02/2017 1126   CREATININE 0.88 07/10/2018 1804   GLUCOSE 140 (H) 07/10/2018 1804   CALCIUM 8.5 (L) 07/10/2018 1804   CBC:    Component Value Date/Time   WBC 15.3 (H) 07/12/2018 0511   HGB 6.7 (LL) 07/12/2018 0511   HGB 7.5 (L) 10/02/2017 1126   HCT 19.8 (L) 07/12/2018 0511   HCT 22.0 (L) 10/02/2017 1126   PLT 388 07/12/2018 0511   PLT 511 (H) 10/02/2017 1126   MCV 96.6 07/12/2018 0511   MCV 94 10/02/2017 1126   NEUTROABS 8.1 (H) 07/12/2018 0511   NEUTROABS 8.8 (H) 10/02/2017 1126   LYMPHSABS 5.4 (H) 07/12/2018 0511   LYMPHSABS 5.2 (H) 10/02/2017 1126   MONOABS 1.0 07/12/2018 0511   EOSABS 0.4 07/12/2018 0511   EOSABS 0.2 10/02/2017 1126   BASOSABS 0.1 07/12/2018 0511   BASOSABS 0.1 10/02/2017 1126    No results found for this or any previous visit (from the past 240 hour(s)).  Studies/Results: Dg Chest 2 View  Result Date: 07/10/2018 CLINICAL DATA:  Bilateral leg cramps for 2 days. EXAM: CHEST - 2 VIEW COMPARISON:  April 21, 2018 FINDINGS: The heart size and mediastinal contours are within  normal limits. Both lungs are clear. The visualized skeletal structures are unremarkable. IMPRESSION: No active cardiopulmonary disease. Electronically Signed   By: Dorise Bullion III M.D   On: 07/10/2018 18:41    Medications: Scheduled Meds: . enoxaparin (LOVENOX) injection  40 mg Subcutaneous Q24H  . HYDROmorphone   Intravenous Q4H  . Influenza vac split quadrivalent PF  0.5 mL Intramuscular Tomorrow-1000  . ketorolac  30 mg Intravenous Q6H  . loratadine  10 mg Oral Daily  . senna-docusate  1 tablet Oral BID   Continuous Infusions: . sodium chloride 125 mL/hr at 07/12/18 0302  . diphenhydrAMINE     PRN Meds:.albuterol, alum & mag hydroxide-simeth, diphenhydrAMINE **OR** diphenhydrAMINE, guaiFENesin, naloxone **AND**  sodium chloride flush, ondansetron **OR** ondansetron (ZOFRAN) IV, oxyCODONE, polyethylene glycol  Consultants:  Wound consult  Social work  Case managment  Procedures:  Transfuse 1 unit of PRBCs on 07/11/2018   Assessment/Plan: Principal Problem:   Sickle cell crisis ([place]) Active Problems:   Anemia of chronic disease   Polysubstance abuse ([facility])   Homelessness   Wheezing on expiration   Cough   Dysuria   Hb Sickle Cell Disease with crisis:  Continue IVF D5 .45% Saline @ 125 mls/hour, continue weight based Dilaudid PCA, IV Toradol 15 mg Q 6 H, Monitor vitals very closely, Re-evaluate pain scale regularly, 2 L of Oxygen by [place].  Anemia of chronic disease: Patient transfused 1 unit of PRBCs on 07/11/2018.  Hemoglobin increased to 6.7 today. CBC in a.m.  Leukocytosis:  Mild leukocytosis, patient afebrile.  Will review chest x-ray as results become available. CBC in a.m.  Polysubstance abuse:  Patient reports history of polysubstance abuse, including cocaine.  Discussed at length.  Will review urine drug screen as results become available.  Also, patient has pending consult with social work.  Contacted Marsha Cullins at [employer] sickle cell agency concerning inpatient drug rehabilitation on hospital discharge.  Left foot ulcer:  Appears to be callus along left great toe.  We will continue to monitor closely.  No further treatment warranted during admission.  Patient warrants outpatient podiatry. Wound consult pending  Dysuria:  Patient sexually active without barrier protection.  Will review STD screenings as they become available.   Homelessness: Patient is between homeless shelters.  He states that he was banned from most recent shelter.  Social work consult pending.  Persistent cough/wheezing:  Previous chest xray on 10/26 does not show acute cardiopulmonary processes.  Albuterol nebulizer every 4 hours as needed Stat chest xray Quantiferon, due to residence in  homeless shelter     Code Status: Full Code Family Communication: N/A Disposition Plan: Not yet ready for discharge  [place], MSN, FNP-C Patient [place], [place] [address] [phone]  If 7PM-7AM, please contact night-coverage.  07/12/2018, 7:14 AM  LOS: 1 day

## 2018-07-13 LAB — RPR: RPR: NONREACTIVE

## 2018-07-13 LAB — CBC
HEMATOCRIT: 18.7 % — AB (ref 39.0–52.0)
Hemoglobin: 6.5 g/dL — CL (ref 13.0–17.0)
MCH: 32 pg (ref 26.0–34.0)
MCHC: 34.8 g/dL (ref 30.0–36.0)
MCV: 92.1 fL (ref 80.0–100.0)
NRBC: 15.6 % — AB (ref 0.0–0.2)
Platelets: 350 10*3/uL (ref 150–400)
RBC: 2.03 MIL/uL — ABNORMAL LOW (ref 4.22–5.81)
RDW: 23.2 % — ABNORMAL HIGH (ref 11.5–15.5)
WBC: 16.4 10*3/uL — AB (ref 4.0–10.5)

## 2018-07-13 LAB — HIV ANTIBODY (ROUTINE TESTING W REFLEX): HIV Screen 4th Generation wRfx: NONREACTIVE

## 2018-07-13 LAB — HEPATITIS PANEL, ACUTE
HCV Ab: 0.1 s/co ratio (ref 0.0–0.9)
HEP B C IGM: NEGATIVE
HEP B S AG: NEGATIVE
Hep A IgM: NEGATIVE

## 2018-07-13 LAB — GC/CHLAMYDIA PROBE AMP (~~LOC~~) NOT AT ARMC
Chlamydia: NEGATIVE
Neisseria Gonorrhea: NEGATIVE

## 2018-07-13 NOTE — Progress Notes (Signed)
Subjective: Jeffrey Morrison, a 22 year old male with a medical history significant for sickle cell anemia was admitted on 07/10/2018 in sickle cell crisis.  Patient says that pain has not improved overnight. Pain intensity 7/10 characterized as constant and throbbing. Patient is also complaining of cough and chest congestion.   Patient denies headache, chest pain, heart palpitations, nausea, vomiting, or diarrhea.    Objective:  Vital signs in last 24 hours:  Vitals:   07/13/18 0348 07/13/18 0459 07/13/18 0500 07/13/18 1010  BP:  112/64 112/64 (!) 111/56  Pulse:  (!) 57 (!) 57 62  Resp: 18 15 15 11   Temp:  98.7 F (37.1 C) 98.7 F (37.1 C) 97.8 F (36.6 C)  TempSrc:  Oral Oral Oral  SpO2: 98% 97% 97% 97%  Weight:   85.8 kg   Height:        Intake/Output from previous day:   Intake/Output Summary (Last 24 hours) at 07/13/2018 1041 Last data filed at 07/13/2018 0355 Gross per 24 hour  Intake 2729.74 ml  Output 900 ml  Net 1829.74 ml   Physical Exam  Constitutional: He appears well-developed and well-nourished.  HENT:  Head: Normocephalic and atraumatic.  Eyes: Pupils are equal, round, and reactive to light.  Neck: Normal range of motion.  Pulmonary/Chest: He has no decreased breath sounds. He has wheezes in the right upper field and the left upper field. He has no rhonchi.  Abdominal: Soft. Bowel sounds are normal.  Skin: Skin is warm and dry. Rash is not macular.  Left 3rd metarsal, rough, scaling, raised, non tender to palpation  Psychiatric: He has a normal mood and affect. His behavior is normal. Judgment and thought content normal.     Lab Results:  Basic Metabolic Panel:    Component Value Date/Time   NA 139 07/12/2018 0511   NA 140 10/02/2017 1126   K 4.5 07/12/2018 0511   CL 110 07/12/2018 0511   CO2 24 07/12/2018 0511   BUN 10 07/12/2018 0511   BUN 9 10/02/2017 1126   CREATININE 0.92 07/12/2018 0511   GLUCOSE 93 07/12/2018 0511   CALCIUM 8.3 (L)  07/12/2018 0511   CBC:    Component Value Date/Time   WBC 15.3 (H) 07/12/2018 0511   HGB 6.7 (LL) 07/12/2018 0511   HGB 7.5 (L) 10/02/2017 1126   HCT 19.8 (L) 07/12/2018 0511   HCT 22.0 (L) 10/02/2017 1126   PLT 388 07/12/2018 0511   PLT 511 (H) 10/02/2017 1126   MCV 96.6 07/12/2018 0511   MCV 94 10/02/2017 1126   NEUTROABS 8.1 (H) 07/12/2018 0511   NEUTROABS 8.8 (H) 10/02/2017 1126   LYMPHSABS 5.4 (H) 07/12/2018 0511   LYMPHSABS 5.2 (H) 10/02/2017 1126   MONOABS 1.0 07/12/2018 0511   EOSABS 0.4 07/12/2018 0511   EOSABS 0.2 10/02/2017 1126   BASOSABS 0.1 07/12/2018 0511   BASOSABS 0.1 10/02/2017 1126    Recent Results (from the past 240 hour(s))  MRSA PCR Screening     Status: None   Collection Time: 07/12/18  4:35 PM  Result Value Ref Range Status   MRSA by PCR NEGATIVE NEGATIVE Final    Comment:        The GeneXpert MRSA Assay (FDA approved for NASAL specimens only), is one component of a comprehensive MRSA colonization surveillance program. It is not intended to diagnose MRSA infection nor to guide or monitor treatment for MRSA infections. Performed at [facility], [address] [place], [place]  [address]     Studies/Results: Dg Chest 2 View  Result Date: 07/12/2018 CLINICAL DATA:  22 year old male with a history of wheezing. Sickle cell disease EXAM: CHEST - 2 VIEW COMPARISON:  07/10/2018, 04/21/2018 FINDINGS: Cardiomediastinal silhouette unchanged in size and contour. No evidence of central vascular congestion. No interlobular septal thickening. Reticular opacities of the bilateral hilar regions and on the posterior lung base on the lateral view. Blunting of the right costophrenic angle and the costophrenic sulcus. IMPRESSION: Mild reticular opacities of the lungs, potentially reflecting infection or acute chest syndrome in a patient with sickle cell disease. Trace right-sided pleural effusion. Electronically Signed   By: Corrie Mckusick D.O.    On: 07/12/2018 10:37    Medications: Scheduled Meds: . enoxaparin (LOVENOX) injection  40 mg Subcutaneous Q24H  . HYDROmorphone   Intravenous Q4H  . Influenza vac split quadrivalent PF  0.5 mL Intramuscular Tomorrow-1000  . ketorolac  30 mg Intravenous Q6H  . loratadine  10 mg Oral Daily  . senna-docusate  1 tablet Oral BID   Continuous Infusions: . sodium chloride 50 mL/hr at 07/13/18 0030  . cefTRIAXone (ROCEPHIN)  IV Stopped (07/12/18 1649)  . diphenhydrAMINE     PRN Meds:.acetaminophen, albuterol, alum & mag hydroxide-simeth, diphenhydrAMINE **OR** diphenhydrAMINE, guaiFENesin, naloxone **AND** sodium chloride flush, ondansetron **OR** ondansetron (ZOFRAN) IV, oxyCODONE, polyethylene glycol  Consultants:  Wound consult  Social work  Case managment  Procedures:  Transfuse 1 unit of PRBCs on 07/11/2018   Assessment/Plan: Principal Problem:   Sickle cell crisis ([place]) Active Problems:   Anemia of chronic disease   Polysubstance abuse ([place])   Homelessness   Wheezing on expiration   Cough   Dysuria   Hb-SS disease with crisis ([place])   Wheezing  Acute chest syndrome: Chest xray on 07/12/2018 shows mild reticular opacities of lungs.  Continue empiric antibiotics MRSA PCR negative Patient received 1 unit PRBCs on 07/11/2018 Continue IVF Maintain oxygen saturation above 90% Pain management with custom dose PCA   Hb Sickle Cell Disease with crisis:  Continue IVF D5 .45% Saline @ 50 mls/hour, continue custom dose Dilaudid PCA, IV Toradol 15 mg Q 6 H, Monitor vitals very closely, Re-evaluate pain scale regularly, 2 L of Oxygen by [place].  Anemia of chronic disease: Patient transfused 1 unit of PRBCs on 07/11/2018.  Hemoglobin increased to 6.5  CBC in a.m.  Leukocytosis:  Mild leukocytosis, patient afebrile.   Continue empiric antibiotics CBC in a.m.  Polysubstance abuse:  Patient reports history of polysubstance abuse, including cocaine.  Discussed at length.   Urine drug screen positive for cocaine. Patient says that he last used 4-5 days ago.  Patient has pending consult with social work.   Contacted Marsha Cullins at [employer] sickle cell agency concerning inpatient drug rehabilitation on hospital discharge.  Left foot ulcer:  Appears to be callus along metatarsals. Continue to monitor.  Patient warrants outpatient podiatry.  Dysuria:  Resolved. Urinalysis unremarkable. STD panel negative.    Homelessness: Patient is between homeless shelters.  He states that he was banned from most recent shelter.  Social work consult pending.  Persistent cough/wheezing:  New lung opacities on chest xray. Empiric antibiotics.   Albuterol nebulizer every 4 hours as needed Stat chest xray Quantiferon pending    Code Status: Full Code Family Communication: N/A Disposition Plan: Not yet ready for discharge  Donia Pounds  APRN, MSN, FNP-C Patient [facility] [address] [place], [place] [address] [phone]  If 5PM-7AM, please contact night-coverage.  07/13/2018, 10:41 AM  LOS: 2 days

## 2018-07-14 LAB — COMPREHENSIVE METABOLIC PANEL
ALK PHOS: 51 U/L (ref 38–126)
ALT: 79 U/L — AB (ref 0–44)
ANION GAP: 6 (ref 5–15)
AST: 53 U/L — ABNORMAL HIGH (ref 15–41)
Albumin: 3.1 g/dL — ABNORMAL LOW (ref 3.5–5.0)
BUN: 12 mg/dL (ref 6–20)
CALCIUM: 8.3 mg/dL — AB (ref 8.9–10.3)
CO2: 27 mmol/L (ref 22–32)
CREATININE: 1.08 mg/dL (ref 0.61–1.24)
Chloride: 106 mmol/L (ref 98–111)
Glucose, Bld: 94 mg/dL (ref 70–99)
Potassium: 4.6 mmol/L (ref 3.5–5.1)
Sodium: 139 mmol/L (ref 135–145)
TOTAL PROTEIN: 6.2 g/dL — AB (ref 6.5–8.1)
Total Bilirubin: 1.9 mg/dL — ABNORMAL HIGH (ref 0.3–1.2)

## 2018-07-14 LAB — QUANTIFERON-TB GOLD PLUS: QUANTIFERON-TB GOLD PLUS: NEGATIVE

## 2018-07-14 LAB — TYPE AND SCREEN
ABO/RH(D): A POS
Antibody Screen: POSITIVE
UNIT DIVISION: 0
Unit division: 0

## 2018-07-14 LAB — BPAM RBC
BLOOD PRODUCT EXPIRATION DATE: 201911212359
Blood Product Expiration Date: 201911182359
ISSUE DATE / TIME: 201910271738
UNIT TYPE AND RH: 5100
UNIT TYPE AND RH: 6200

## 2018-07-14 LAB — CBC
HCT: 18.1 % — ABNORMAL LOW (ref 39.0–52.0)
HEMOGLOBIN: 6.2 g/dL — AB (ref 13.0–17.0)
MCH: 32 pg (ref 26.0–34.0)
MCHC: 34.3 g/dL (ref 30.0–36.0)
MCV: 93.3 fL (ref 80.0–100.0)
Platelets: 360 10*3/uL (ref 150–400)
RBC: 1.94 MIL/uL — ABNORMAL LOW (ref 4.22–5.81)
RDW: 23 % — ABNORMAL HIGH (ref 11.5–15.5)
WBC: 14.5 10*3/uL — ABNORMAL HIGH (ref 4.0–10.5)
nRBC: 11.7 % — ABNORMAL HIGH (ref 0.0–0.2)

## 2018-07-14 LAB — QUANTIFERON-TB GOLD PLUS (RQFGPL)
QUANTIFERON NIL VALUE: 0.04 [IU]/mL
QuantiFERON Mitogen Value: >10 IU/mL
QuantiFERON TB1 Ag Value: 0.04 IU/mL
QuantiFERON TB2 Ag Value: 0.04 IU/mL

## 2018-07-14 LAB — PREPARE RBC (CROSSMATCH)

## 2018-07-14 MED ORDER — SODIUM CHLORIDE 0.9% IV SOLUTION
Freq: Once | INTRAVENOUS | Status: AC
  Administered 2018-07-14: 18:00:00 via INTRAVENOUS

## 2018-07-14 NOTE — Progress Notes (Signed)
Pt states he had sleep apnea as a child but hasn't worn a CPAP in many years.  Pt states he will call if he wants to use a CPAP tonight.

## 2018-07-14 NOTE — Care Management Important Message (Signed)
Important Message  Patient Details  Name: CORDON GASSETT MRN: [id number] Date of Birth: 09/12/1996   Medicare Important Message Given:  Yes    Kerin Salen 07/14/2018, 11:40 AMImportant Message  Patient Details  Name: KI CORBO MRN: [id number] Date of Birth: 10-Dec-1995   Medicare Important Message Given:  Yes    Kerin Salen 07/14/2018, 11:40 AM
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
## 2018-07-15 LAB — TYPE AND SCREEN
ABO/RH(D): A POS
Antibody Screen: POSITIVE
DAT, IgG: NEGATIVE
UNIT DIVISION: 0

## 2018-07-15 LAB — BPAM RBC
BLOOD PRODUCT EXPIRATION DATE: 201911212359
ISSUE DATE / TIME: 201910301734
UNIT TYPE AND RH: 6200

## 2018-07-15 LAB — HEMOGLOBIN AND HEMATOCRIT, BLOOD
HEMATOCRIT: 22.9 % — AB (ref 39.0–52.0)
HEMOGLOBIN: 7.8 g/dL — AB (ref 13.0–17.0)

## 2018-07-15 MED ORDER — HYDROXYUREA 500 MG PO CAPS
1000.0000 mg | ORAL_CAPSULE | Freq: Every day | ORAL | Status: DC
  Administered 2018-07-15 – 2018-07-16 (×2): 1000 mg via ORAL
  Filled 2018-07-15 (×2): qty 2

## 2018-07-15 NOTE — Progress Notes (Signed)
Pt states he will have his RN call RT if he decides to use CPAP tonight while sleeping.
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
## 2018-07-16 DIAGNOSIS — J189 Pneumonia, unspecified organism: Secondary | ICD-10-CM

## 2018-07-16 DIAGNOSIS — G4733 Obstructive sleep apnea (adult) (pediatric): Secondary | ICD-10-CM

## 2018-07-16 LAB — CBC
HEMATOCRIT: 21.8 % — AB (ref 39.0–52.0)
Hemoglobin: 7.3 g/dL — ABNORMAL LOW (ref 13.0–17.0)
MCH: 30.7 pg (ref 26.0–34.0)
MCHC: 33.5 g/dL (ref 30.0–36.0)
MCV: 91.6 fL (ref 80.0–100.0)
Platelets: 393 10*3/uL (ref 150–400)
RBC: 2.38 MIL/uL — AB (ref 4.22–5.81)
RDW: 22.7 % — AB (ref 11.5–15.5)
WBC: 16.2 10*3/uL — ABNORMAL HIGH (ref 4.0–10.5)
nRBC: 7.1 % — ABNORMAL HIGH (ref 0.0–0.2)

## 2018-07-16 MED ORDER — CITALOPRAM HYDROBROMIDE 40 MG PO TABS: 40.0000 mg | ORAL_TABLET | Freq: Every day | ORAL | 0 refills | Status: DC

## 2018-07-16 MED ORDER — GABAPENTIN 300 MG PO CAPS: 300.0000 mg | ORAL_CAPSULE | Freq: Three times a day (TID) | ORAL | 0 refills | Status: DC

## 2018-07-16 MED ORDER — IBUPROFEN 800 MG PO TABS: 800.0000 mg | ORAL_TABLET | Freq: Three times a day (TID) | ORAL | 0 refills | Status: DC | PRN

## 2018-07-16 MED ORDER — AMOXICILLIN-POT CLAVULANATE 875-125 MG PO TABS: 1.0000 | ORAL_TABLET | Freq: Two times a day (BID) | ORAL | 0 refills | Status: AC

## 2018-07-16 MED ORDER — HYDROXYUREA 500 MG PO CAPS: 500.0000 mg | ORAL_CAPSULE | Freq: Two times a day (BID) | ORAL | 0 refills | Status: DC

## 2018-07-16 MED ORDER — FOLIC ACID 1 MG PO TABS: 1.0000 mg | ORAL_TABLET | Freq: Every day | ORAL | 0 refills | Status: DC

## 2018-07-16 MED ORDER — ACETAMINOPHEN 325 MG PO TABS: 650.0000 mg | ORAL_TABLET | Freq: Four times a day (QID) | ORAL | 0 refills | Status: DC | PRN

## 2018-07-16 MED ORDER — BUPROPION HCL ER (XL) 150 MG PO TB24: 150.0000 mg | ORAL_TABLET | Freq: Every day | ORAL | 0 refills | Status: DC

## 2018-07-16 NOTE — Discharge Instructions (Signed)
Patient will be accepted in drug rehabillation center on 07/23/2018, which has been facilitated by [facility]. Will have a home visit in the next 3-4 days.   Finding Treatment for Addiction What is addiction? Addiction is a complex disease of the brain. It causes an uncontrollable (compulsive) need for a substance. You can be addicted to alcohol, illegal drugs, or prescription medicines such as painkillers. Addiction can also be a behavior, like gambling or shopping. The need for the drug or activity can become so strong that you think about it all the time. You can also become physically dependent on a substance. Addiction can change the way your brain works. Because of these changes, getting more of whatever you are addicted to becomes the most important thing to you and feels better than other activities or relationships. Addiction can lead to changes in health, behavior, emotions, relationships, and choices that affect you and everyone around you. How do I know if I need treatment for addiction? Addiction is a progressive disease. Without treatment, addiction can get worse. Living with addiction puts you at higher risk for injury, poor health, lost employment, loss of money, and even death. You might need treatment for addiction if:  You have tried to stop or cut down, but you cannot.  Your addiction is causing physical health problems.  You find it annoying that your friends and family are concerned about your alcohol or substance use.  You feel guilty about substance abuse or a compulsive behavior.  You have lied or tried to hide your addiction.  You need a particular substance or activity to start your day or to calm down.  You are getting in trouble at school, work, home, or with the police.  You have done something illegal to support your addiction.  You are running out of money because of your addiction.  You have no time for anything other than  your addiction.  What types of treatment are available? The treatment program that is right for you will depend on many factors, including the type of addiction you have. Treatment programs can be outpatient or inpatient. In an outpatient program, you live at home and go to work or school, but you also go to a clinic for treatment. With an inpatient program, you live and sleep at the program facility during treatment. After treatment, you might need a plan for support during recovery. Other treatment options include:  Medicine. ? Some addictions may be treated with prescription medicines. ? You might also need medicine to treat anxiety or depression.  Counseling and behavior therapy. Therapy can help individuals and families behave in healthier ways and relate more effectively.  Support groups. Confidential group therapy, such as a 12-step program, can help individuals and families during treatment and recovery.  No single type of program is right for everyone. Many treatment programs involve a combination of education, counseling, and a 12-step, spiritually-based approach. Some treatment programs are government sponsored. They are geared for patients who do not have private insurance. Treatment programs can vary in many respects, such as:  Cost and types of insurance that are accepted.  Types of on-site medical services that are offered.  Length of stay, setting, and size.  Overall philosophy of treatment.  What should I consider when selecting a treatment program? It is important to think about your individual requirements when selecting a treatment program. There are a number of things to consider, such as:  If the program is certified  by the appropriate government agency. Even private programs must be certified and employ certified professionals.  If the program is covered by your insurance. If finances are a concern, the first call you should make is to your insurance company, if you  have health insurance. Ask for a list of treatment programs that are in your network, and confirm any copayments and deductibles that you may have to pay. ? If you do not have insurance, or if you choose to attend a program that does not accept your insurance, discuss whether a payment plan can be set up.  If treatment is available in languages other than English, if needed.  If the program offers detoxification treatment, if needed.  If 12-step meetings are held at the center or if transport is available for patients to attend meetings at other locations.  If the program is professional, organized, and clean.  If the program meets all of your needs, including physical and cultural needs.  If the facility offers specific treatment for your particular addiction.  If support continues to be offered after you have left the program.  If your treatment plan is continually looked at to make sure you are receiving the right treatment at the right time.  If mental health counseling is part of your treatment.  If medicine is included in treatment, if needed.  If your family is included in your treatment plan and if support is offered to them throughout the treatment process.  How the treatment works to prevent relapse.  Where else can I get help?  Your health care provider. Ask him or her to help you find addiction treatment. These discussions are confidential.  The [employer] on Alcoholism and Drug Dependence (NCADD). This group has information about treatment centers and programs for people who have an addiction and for family members. ? The telephone number is 1-800-NCA-CALL ((843) 220-1681). ? The website is https://ncadd.org/about-ncadd/our-affiliates  The Substance Abuse and Mental Health Services Administration [facility]). This group will help you find publicly funded treatment centers, help hotlines, and counseling services near you. ? The telephone number is 1-800-662-HELP  5874949273). ? The website is www.findtreatment.[employer].com.cy In countries outside of the [place] and [place], look in [employer] for contact information for services in your area. This information is not intended to replace advice given to you by your health care provider. Make sure you discuss any questions you have with your health care provider. Document Released: 07/31/2005 Document Revised: 07/28/2016 Document Reviewed: 06/20/2014 Elsevier Interactive Patient Education  2017 Elsevier Inc.  Sickle Cell Anemia, Adult Sickle cell anemia is a condition where your red blood cells are shaped like sickles. Red blood cells carry oxygen through the body. Sickle-shaped red blood cells do not live as long as normal red blood cells. They also clump together and block blood from flowing through the blood vessels. These things prevent the body from getting enough oxygen. Sickle cell anemia causes organ damage and pain. It also increases the risk of infection. Follow these instructions at home:  Drink enough fluid to keep your pee (urine) clear or pale yellow. Drink more in hot weather and during exercise.  Do not smoke. Smoking lowers oxygen levels in the blood.  Only take over-the-counter or prescription medicines as told by your doctor.  Take antibiotic medicines as told by your doctor. Make sure you finish them even if you start to feel better.  Take supplements as told by your doctor.  Consider wearing a medical alert bracelet. This tells anyone  caring for you in an emergency of your condition.  When traveling, keep your medical information, doctors' names, and the medicines you take with you at all times.  If you have a fever, do not take fever medicines right away. This could cover up a problem. Tell your doctor.  Keep all follow-up visits with your doctor. Sickle cell anemia requires regular medical care. Contact a doctor if: You have a fever. Get help right away if:  You feel  dizzy or faint.  You have new belly (abdominal) pain, especially on the left side near the stomach area.  You have a lasting, often uncomfortable and painful erection of the penis (priapism). If it is not treated right away, you will become unable to have sex (impotence).  You have numbness in your arms or legs or you have a hard time moving them.  You have a hard time talking.  You have a fever or lasting symptoms for more than 2-3 days.  You have a fever and your symptoms suddenly get worse.  You have signs or symptoms of infection. These include: ? Chills. ? Being more tired than normal (lethargy). ? Irritability. ? Poor eating. ? Throwing up (vomiting).  You have pain that is not helped with medicine.  You have shortness of breath.  You have pain in your chest.  You are coughing up pus-like or bloody mucus.  You have a stiff neck.  Your feet or hands swell or have pain.  Your belly looks bloated.  Your joints hurt. This information is not intended to replace advice given to you by your health care provider. Make sure you discuss any questions you have with your health care provider. Document Released: 06/22/2013 Document Revised: 02/07/2016 Document Reviewed: 04/13/2013 Elsevier Interactive Patient Education  2017 [place] Pneumonia, Adult Pneumonia is an infection of the lungs. One type of pneumonia can happen while a person is in a hospital. A different type can happen when a person is not in a hospital (community-acquired pneumonia). It is easy for this kind to spread from person to person. It can spread to you if you breathe near an infected person who coughs or sneezes. Some symptoms include:  A dry cough.  A wet (productive) cough.  Fever.  Sweating.  Chest pain.  Follow these instructions at home:  Take over-the-counter and prescription medicines only as told by your doctor. ? Only take cough medicine if you are losing  sleep. ? If you were prescribed an antibiotic medicine, take it as told by your doctor. Do not stop taking the antibiotic even if you start to feel better.  Sleep with your head and neck raised (elevated). You can do this by putting a few pillows under your head, or you can sleep in a recliner.  Do not use tobacco products. These include cigarettes, chewing tobacco, and e-cigarettes. If you need help quitting, ask your doctor.  Drink enough water to keep your pee (urine) clear or pale yellow. A shot (vaccine) can help prevent pneumonia. Shots are often suggested for:  People older than 24 years of age.  People older than 24 years of age: ? Who are having cancer treatment. ? Who have long-term (chronic) lung disease. ? Who have problems with their body's defense system (immune system).  You may also prevent pneumonia if you take these actions:  Get the flu (influenza) shot every year.  Go to the dentist as often as told.  Wash your hands often. If soap  and water are not available, use hand sanitizer.  Contact a doctor if:  You have a fever.  You lose sleep because your cough medicine does not help. Get help right away if:  You are short of breath and it gets worse.  You have more chest pain.  Your sickness gets worse. This is very serious if: ? You are an older adult. ? Your body's defense system is weak.  You cough up blood. This information is not intended to replace advice given to you by your health care provider. Make sure you discuss any questions you have with your health care provider. Document Released: 02/18/2008 Document Revised: 02/07/2016 Document Reviewed: 12/27/2014 Elsevier Interactive Patient Education  [employer].

## 2018-07-16 NOTE — Discharge Summary (Signed)
Physician Discharge Summary  Jeffrey Morrison GLO:[id number] DOB: 1996/07/28 DOA: 07/10/2018  PCP: Jeffrey Jun, FNP  Admit date: 07/10/2018  Discharge date: 07/16/2018  Discharge Diagnoses:  Principal Problem:   Sickle cell crisis ([place]) Active Problems:   Anemia of chronic disease   Polysubstance abuse ([place])   Homelessness   Wheezing on expiration   Cough   Dysuria   Hb-SS disease with crisis ([place])   Wheezing   Discharge Condition: Stable  Disposition:  Follow-up Information    Jeffrey Glatter, FNP Follow up.   Specialty:  Family Medicine Why:  1 week for post hospital follow up.  Contact information: [place] [address] [phone]          Pt is discharged home in good condition and is to follow up with Jeffrey Morrison in 1 week  to have labs evaluated. Jeffrey Morrison is instructed to increase activity slowly and balance with rest for the next few days, and use prescribed medication to complete treatment of pain  Diet: Regular Wt Readings from Last 3 Encounters:  07/15/18 90.8 kg  04/26/18 86.6 kg  10/02/17 81.2 kg    History of present illness:  Jeffrey Morrison, a 22 year old male with a medical history significant for sickle cell anemia, chronic pain syndrome, and polysubstance abuse present complaining of bilateral leg pain for greater than 24 hours.  Patient is homeless and states that he cannot afford home medications so he is not had been taking home medications.  Patient denies fevers or shortness of breath.  Denies nausea, vomiting, or diarrhea.  He also denies any abdominal pain.  Patient received ketamine and Dilaudid via EMS during transport to hospital and is currently mildly hypoxic secondary to being oversedated.  Patient is being referred for admission for sickle cell crisis.  Hospital Course:   Community-acquired pneumonia: Chest x-ray on 07/12/2018 showed mild reticular opacities of the lung, which was a change since  initial chest x-ray done on 07/10/2018.  Patient was treated with empiric antibiotics for 5 days.  Patient has remained afebrile and maintain oxygen saturation above 90%.  Patient will discharge home with Augmentin 875-125 mg for 7 days.  Recommend follow-up chest x-ray in 4 weeks.  Patient to follow-up in primary care in 1 week.  Leukocytosis stable.  Sickle cell anemia with crisis: Patient treated with custom dose PCA.  Patient could receive 1.25 mg/h.  Patient has not been maximizing PCA Dilaudid.  Patient was also treated with other adjunct therapies including IV Toradol, gabapentin, and oxycodone 10 mg every 4 hours as needed.  Patient has not requested oxycodone for greater than 48 hours.  Patient has a history of polysubstance abuse.  Current urine drug screen was positive for cocaine and marijuana.  Patient was not discharged with prescription for opiate medications.  We will continue ibuprofen 800 mg every 8 hours and gabapentin 300 mg every 8 hours for chronic pain related to sickle cell anemia. Pain intensity 3/10 prior to discharge.   Polysubstance abuse: Patient admitted to using cocaine, fentanyl, and marijuana.  Patient states that he is not used illicit drugs in 4-5 days.  Patient was referred to rehabilitation centers.  Difficult fine due to insurance constraints.  Bed available at [facility] on 07/23/2018.  Patient will be discharged home with family friend and admitted to treatment facility as bed becomes available.  He is working closely with case management at [facility] cell agency.  Jeffrey Morrison, case manager is  scheduled to do a home visit within 3 to 4 days of discharge. Homelessness: Patient had social work consult and was given information on homeless shelters.  However, patient will discharge with family friend and will be admitted to inpatient drug facility on 07/23/2018. Anemia of chronic disease: Hemoglobin decreased to 5.1.  Patient received a total of 2 units  of packed red blood cells throughout admission.  Hemoglobin 7.3 at discharge.  Patient to follow-up in primary care in 1 week to repeat CBC with differential. Patient was admitted for sickle cell pain crisis and managed appropriately with IVF, IV Dilaudid via PCA and IV Toradol, as well as other adjunct therapies per sickle cell pain management protocols.  Patient was discharged  today in a hemodynamically stable condition.   Discharge Exam: Vitals:   07/16/18 1320 07/16/18 1554  BP: 133/74   Pulse: 72   Resp: 16 16  Temp: 98.4 F (36.9 C)   SpO2: 95% 98%   Vitals:   07/16/18 1113 07/16/18 1152 07/16/18 1320 07/16/18 1554  BP: 134/77  133/74   Pulse: 67  72   Resp: 16 (!) 24 16 16   Temp: 98.4 F (36.9 C)  98.4 F (36.9 C)   TempSrc: Oral  Oral   SpO2: 98% 94% 95% 98%  Weight:      Height:        General appearance : Awake, alert, not in any distress. Speech Clear. Not toxic looking HEENT: Atraumatic and Normocephalic, pupils equally reactive to light and accomodation Neck: Supple, no JVD. No cervical lymphadenopathy.  Chest: Good air entry bilaterally, no added sounds  CVS: S1 S2 regular, no murmurs.  Abdomen: Bowel sounds present, Non tender and not distended with no gaurding, rigidity or rebound. Extremities: B/L Lower Ext shows no edema, both legs are warm to touch Neurology: Awake alert, and oriented X 3, CN II-XII intact, Non focal Skin: No Rash  Discharge Instructions  Discharge Instructions    Discharge patient   Complete by:  As directed    Discharge disposition:  01-Home or Self Care   Discharge patient date:  07/16/2018     Allergies as of 07/16/2018   No Known Allergies     Medication List    TAKE these medications   acetaminophen 325 MG tablet Commonly known as:  TYLENOL Take 2 tablets (650 mg total) by mouth every 6 (six) hours as needed for headache. What changed:  reasons to take this   amoxicillin-clavulanate 875-125 MG tablet Commonly known  as:  AUGMENTIN Take 1 tablet by mouth 2 (two) times daily for 7 days.   buPROPion 150 MG 24 hr tablet Commonly known as:  WELLBUTRIN XL Take 1 tablet (150 mg total) by mouth daily.   citalopram 40 MG tablet Commonly known as:  CELEXA Take 1 tablet (40 mg total) by mouth daily.   folic acid 1 MG tablet Commonly known as:  FOLVITE Take 1 tablet (1 mg total) by mouth daily.   gabapentin 300 MG capsule Commonly known as:  NEURONTIN Take 1 capsule (300 mg total) by mouth 3 (three) times daily.   hydroxyurea 500 MG capsule Commonly known as:  HYDREA Take 1 capsule (500 mg total) by mouth 2 (two) times daily. May take with food to minimize GI side effects.   ibuprofen 800 MG tablet Commonly known as:  ADVIL,MOTRIN Take 1 tablet (800 mg total) by mouth every 8 (eight) hours as needed (sickle aching cell pain and inflammation). What changed:  medication strength  how much to take       The results of significant diagnostics from this hospitalization (including imaging, microbiology, ancillary and laboratory) are listed below for reference.    Significant Diagnostic Studies: Dg Chest 2 View  Result Date: 07/12/2018 CLINICAL DATA:  22 year old male with a history of wheezing. Sickle cell disease EXAM: CHEST - 2 VIEW COMPARISON:  07/10/2018, 04/21/2018 FINDINGS: Cardiomediastinal silhouette unchanged in size and contour. No evidence of central vascular congestion. No interlobular septal thickening. Reticular opacities of the bilateral hilar regions and on the posterior lung base on the lateral view. Blunting of the right costophrenic angle and the costophrenic sulcus. IMPRESSION: Mild reticular opacities of the lungs, potentially reflecting infection or acute chest syndrome in a patient with sickle cell disease. Trace right-sided pleural effusion. Electronically Signed   By: Corrie Mckusick D.O.   On: 07/12/2018 10:37   Dg Chest 2 View  Result Date: 07/10/2018 CLINICAL DATA:   Bilateral leg cramps for 2 days. EXAM: CHEST - 2 VIEW COMPARISON:  April 21, 2018 FINDINGS: The heart size and mediastinal contours are within normal limits. Both lungs are clear. The visualized skeletal structures are unremarkable. IMPRESSION: No active cardiopulmonary disease. Electronically Signed   By: Dorise Bullion III M.D   On: 07/10/2018 18:41    Microbiology: Recent Results (from the past 240 hour(s))  MRSA PCR Screening     Status: None   Collection Time: 07/12/18  4:35 PM  Result Value Ref Range Status   MRSA by PCR NEGATIVE NEGATIVE Final    Comment:        The GeneXpert MRSA Assay (FDA approved for NASAL specimens only), is one component of a comprehensive MRSA colonization surveillance program. It is not intended to diagnose MRSA infection nor to guide or monitor treatment for MRSA infections. Performed at [facility], [place] [address] [place], [place] [address]      Labs: Basic Metabolic Panel: Recent Labs  Lab 07/10/18 1804 07/12/18 0511 07/14/18 0926  NA 139 139 139  K 3.5 4.5 4.6  CL 108 110 106  CO2 25 24 27   GLUCOSE 140* 93 94  BUN 11 10 12   CREATININE 0.88 0.92 1.08  CALCIUM 8.5* 8.3* 8.3*   Liver Function Tests: Recent Labs  Lab 07/10/18 1804 07/12/18 0511 07/14/18 0926  AST 30 96* 53*  ALT 25 84* 79*  ALKPHOS 54 47 51  BILITOT 2.0* 1.7* 1.9*  PROT 6.8 6.1* 6.2*  ALBUMIN 3.6 3.2* 3.1*   No results for input(s): LIPASE, AMYLASE in the last 168 hours. No results for input(s): AMMONIA in the last 168 hours. CBC: Recent Labs  Lab 07/10/18 1804 07/11/18 1459 07/12/18 0511 07/13/18 0944 07/14/18 0926 07/15/18 0134 07/16/18 0402  WBC 22.0* 16.4* 15.3* 16.4* 14.5*  --  16.2*  NEUTROABS 12.5*  --  8.1*  --   --   --   --   HGB 5.9* 5.7* 6.7* 6.5* 6.2* 7.8* 7.3*  HCT 17.5* 16.6* 19.8* 18.7* 18.1* 22.9* 21.8*  MCV 95.6 96.0 96.6 92.1 93.3  --  91.6  PLT 405* 384 388 350 360  --  393   Cardiac Enzymes: No results  for input(s): CKTOTAL, CKMB, CKMBINDEX, TROPONINI in the last 168 hours. BNP: Invalid input(s): POCBNP CBG: No results for input(s): GLUCAP in the last 168 hours.  Time coordinating discharge: 50 minutes  Signed: Donia Pounds  APRN, MSN, FNP-C [facility] [place]  [place], [place] [address] [phone]  [facility] 07/16/2018, 4:53 PM

## 2018-07-16 NOTE — Progress Notes (Signed)
RN went to patient's room and seen note on board that stated "I need to kill myself, stop bothering these nurses w/ my problems because I'm nothing and I want for nothing." This RN addressed pt and asked if he was having suicidal thoughts and if he had a plan and patient responded they made me feel like I was a bother earlier and that's not how I feel now. Pt also said "I was just playing." RN educated patient that suicide is not something that we play around about. Will continue to monitor. Charge nurse and [facility] made aware.

## 2018-08-06 ENCOUNTER — Emergency Department (HOSPITAL_COMMUNITY)
Admission: EM | Admit: 2018-08-06 | Discharge: 2018-08-06 | Disposition: A | Discharge status: Home / Self Care | Payer: Medicare Other | Attending: Emergency Medicine | Admitting: Emergency Medicine

## 2018-08-06 ENCOUNTER — Encounter (HOSPITAL_COMMUNITY): Payer: Self-pay | Admitting: *Deleted

## 2018-08-06 ENCOUNTER — Other Ambulatory Visit: Payer: Self-pay

## 2018-08-06 DIAGNOSIS — F1410 Cocaine abuse, uncomplicated: Secondary | ICD-10-CM | POA: Diagnosis not present

## 2018-08-06 DIAGNOSIS — Z862 Personal history of diseases of the blood and blood-forming organs and certain disorders involving the immune mechanism: Secondary | ICD-10-CM

## 2018-08-06 DIAGNOSIS — M25571 Pain in right ankle and joints of right foot: Secondary | ICD-10-CM | POA: Diagnosis not present

## 2018-08-06 DIAGNOSIS — M25572 Pain in left ankle and joints of left foot: Secondary | ICD-10-CM | POA: Diagnosis not present

## 2018-08-06 DIAGNOSIS — D571 Sickle-cell disease without crisis: Secondary | ICD-10-CM | POA: Diagnosis not present

## 2018-08-06 NOTE — ED Notes (Signed)
Pt verbalized understanding of discharge paperwork and resources.

## 2018-08-06 NOTE — ED Triage Notes (Signed)
Pt reports wanting medical clearance for detox program. Pt has sickle cell and reports using cocaine for his pain control. Last used it yesterday. Denies any abnormal or excessive sickle cell pain at this time.

## 2018-08-06 NOTE — Discharge Instructions (Addendum)
Please follow up with [facility] and [facility] to establish primary care.  You will need a primary care provider to help manage your sickle cell disease.  Hopefully with appropriate medication your pain will be under control and you will not need cocaine or marijuana to help cope with your discomfort.  Use resources below.

## 2018-08-06 NOTE — ED Provider Notes (Signed)
[facility] EMERGENCY DEPARTMENT Provider Note   CSN: [id number] Arrival date & time: 08/06/18  [facility]     History   Chief Complaint Chief Complaint  Patient presents with  . Medical Clearance    HPI Jeffrey Morrison is a 22 y.o. male.  The history is provided by the patient and medical records. No language interpreter was used.     22 year old male, history of sickle cell disease, polysubstance abuse, homelessness presenting requesting for help with detox.  Patient states he does not get adequate management of his sickle cell pain with ibuprofen.  Therefore, for the past 3 to 4 months he has turned to street drugs to cope with his pain.  He is using marijuana as well as cocaine, last use was earlier today.  He felt that he is beginning to be more dependent on these medication and would like to seek help.  Aside from his chronic pain in his ankles related to sickle cell disease, currently he denies having any fever, chest pain, trouble breathing, abdominal pain.  He states he does not have any primary care provider.  Past Medical History:  Diagnosis Date  . Chronic lower back pain   . Sickle cell crisis [facility])     Patient Active Problem List   Diagnosis Date Noted  . Community acquired pneumonia   . Polysubstance abuse ([place]) 07/12/2018  . Homelessness 07/12/2018  . Wheezing on expiration 07/12/2018  . Cough 07/12/2018  . Dysuria 07/12/2018  . Hb-SS disease with crisis ([place])   . Wheezing   . Sickle cell crisis ([place]) 07/10/2018  . Sickle cell pain crisis ([place]) 04/21/2018  . CMV (cytomegalovirus) ([place]) 08/27/2017  . Erectile dysfunction 08/27/2017  . Depression 08/25/2017  . OSA (obstructive sleep apnea)   . Anemia of chronic disease     Past Surgical History:  Procedure Laterality Date  . SPLENECTOMY          Home Medications    Prior to Admission medications   Medication Sig Start Date End Date Taking? Authorizing Provider  acetaminophen  (TYLENOL) 325 MG tablet Take 2 tablets (650 mg total) by mouth every 6 (six) hours as needed for headache. 07/16/18   Dorena Dew, FNP  buPROPion (WELLBUTRIN XL) 150 MG 24 hr tablet Take 1 tablet (150 mg total) by mouth daily. 07/16/18   Dorena Dew, FNP  citalopram (CELEXA) 40 MG tablet Take 1 tablet (40 mg total) by mouth daily. 07/16/18   Dorena Dew, FNP  folic acid (FOLVITE) 1 MG tablet Take 1 tablet (1 mg total) by mouth daily. 07/16/18   Dorena Dew, FNP  gabapentin (NEURONTIN) 300 MG capsule Take 1 capsule (300 mg total) by mouth 3 (three) times daily. 07/16/18   Dorena Dew, FNP  hydroxyurea (HYDREA) 500 MG capsule Take 1 capsule (500 mg total) by mouth 2 (two) times daily. May take with food to minimize GI side effects. 07/16/18   Dorena Dew, FNP  ibuprofen (ADVIL,MOTRIN) 800 MG tablet Take 1 tablet (800 mg total) by mouth every 8 (eight) hours as needed (sickle aching cell pain and inflammation). 07/16/18   Dorena Dew, FNP    Family History Family History  Problem Relation Age of Onset  . Diabetes Mother     Social History Social History   Tobacco Use  . Smoking status: Current Every Day Smoker    Packs/day: 0.50    Types: Cigarettes  . Smokeless tobacco: Never Used  Substance Use Topics  . Alcohol use: No  . Drug use: No     Allergies   Patient has no known allergies.   Review of Systems Review of Systems  All other systems reviewed and are negative.    Physical Exam Updated Vital Signs BP 113/61   Pulse 81   Temp 98.8 F (37.1 C) (Oral)   Resp 17   SpO2 98%   Physical Exam  Constitutional: He appears well-developed and well-nourished. No distress.  HENT:  Head: Atraumatic.  Eyes: Conjunctivae are normal.  Neck: Neck supple.  Cardiovascular: Normal rate and regular rhythm.  Pulmonary/Chest: Effort normal and breath sounds normal.  Abdominal: Soft. Bowel sounds are normal. He exhibits no distension.    Musculoskeletal: He exhibits tenderness (Mild tenderness about the ankle bilaterally without any overlying skin changes.).  Neurological: He is alert.  Skin: No rash noted.  Psychiatric: He has a normal mood and affect.  Nursing note and vitals reviewed.    ED Treatments / Results  Labs (all labs ordered are listed, but only abnormal results are displayed) Labs Reviewed - No data to display  EKG None  Radiology No results found.  Procedures Procedures (including critical care time)  Medications Ordered in ED Medications - No data to display   Initial Impression / Assessment and Plan / ED Course  I have reviewed the triage vital signs and the nursing notes.  Pertinent labs & imaging results that were available during my care of the patient were reviewed by me and considered in my medical decision making (see chart for details).     BP 113/61   Pulse 81   Temp 98.8 F (37.1 C) (Oral)   Resp 17   SpO2 98%    Final Clinical Impressions(s) / ED Diagnoses   Final diagnoses:  Cocaine abuse (HCC)  Hx of sickle cell anemia    ED Discharge Orders    None     6:59 PM Patient is here requesting help with detox from cocaine and marijuana use.  He denies any SI or HI.  He attributed his substance abuse due to pain from his sickle cells disease.  No worsening pain today.  He does not have a primary care provider.  States ibuprofen at home does not provide adequate relief.  I felt that patient would be best managed by primary care provider to further care of his sickle cells disease.  I will also provide additional resources for outpatient detox.  Otherwise, patient does not have any complaints to suggest sickle cell crisis at this time.  He is afebrile, vital signs stable, no SI or HI.   Domenic Moras, PA-C 08/06/18 Docia Chuck    Dorie Rank, MD 08/07/18 Berniece Salines

## 2018-09-29 ENCOUNTER — Encounter (HOSPITAL_COMMUNITY): Payer: Self-pay

## 2018-09-29 ENCOUNTER — Emergency Department (HOSPITAL_COMMUNITY)
Admission: EM | Admit: 2018-09-29 | Discharge: 2018-09-30 | Payer: Medicare Other | Attending: Emergency Medicine | Admitting: Emergency Medicine

## 2018-09-29 DIAGNOSIS — Z79899 Other long term (current) drug therapy: Secondary | ICD-10-CM | POA: Diagnosis not present

## 2018-09-29 DIAGNOSIS — D571 Sickle-cell disease without crisis: Secondary | ICD-10-CM | POA: Insufficient documentation

## 2018-09-29 DIAGNOSIS — F1910 Other psychoactive substance abuse, uncomplicated: Secondary | ICD-10-CM | POA: Diagnosis not present

## 2018-09-29 DIAGNOSIS — Z590 Homelessness: Secondary | ICD-10-CM | POA: Insufficient documentation

## 2018-09-29 DIAGNOSIS — I213 ST elevation (STEMI) myocardial infarction of unspecified site: Secondary | ICD-10-CM | POA: Diagnosis not present

## 2018-09-29 DIAGNOSIS — R51 Headache: Secondary | ICD-10-CM | POA: Diagnosis not present

## 2018-09-29 DIAGNOSIS — R0902 Hypoxemia: Secondary | ICD-10-CM | POA: Diagnosis not present

## 2018-09-29 DIAGNOSIS — R4182 Altered mental status, unspecified: Secondary | ICD-10-CM | POA: Diagnosis not present

## 2018-09-29 DIAGNOSIS — M549 Dorsalgia, unspecified: Secondary | ICD-10-CM | POA: Diagnosis not present

## 2018-09-29 DIAGNOSIS — R404 Transient alteration of awareness: Secondary | ICD-10-CM | POA: Diagnosis not present

## 2018-09-29 DIAGNOSIS — R4020 Unspecified coma: Secondary | ICD-10-CM | POA: Diagnosis not present

## 2018-09-29 DIAGNOSIS — F17210 Nicotine dependence, cigarettes, uncomplicated: Secondary | ICD-10-CM | POA: Insufficient documentation

## 2018-09-29 DIAGNOSIS — R0789 Other chest pain: Secondary | ICD-10-CM | POA: Diagnosis not present

## 2018-09-29 MED ORDER — LACTATED RINGERS IV BOLUS
1000.0000 mL | Freq: Once | INTRAVENOUS | Status: AC
  Administered 2018-09-30: 1000 mL via INTRAVENOUS

## 2018-09-29 MED ORDER — NALOXONE HCL 2 MG/2ML IJ SOSY
PREFILLED_SYRINGE | INTRAMUSCULAR | Status: AC
  Filled 2018-09-29: qty 2

## 2018-09-29 MED ORDER — AMMONIA AROMATIC IN INHA
RESPIRATORY_TRACT | Status: AC
  Administered 2018-09-30
  Filled 2018-09-29: qty 20

## 2018-09-29 NOTE — ED Triage Notes (Signed)
Pt comes via [facility] after running from police and then falling, pt stated that he did meth and heroin tonight by "banging" and then went unresponsive with EMS, given 2mg  narcan with no improvement. Pt now responding after ammonia ampule.

## 2018-09-30 ENCOUNTER — Emergency Department (HOSPITAL_COMMUNITY): Payer: Medicare Other

## 2018-09-30 DIAGNOSIS — R4020 Unspecified coma: Secondary | ICD-10-CM | POA: Diagnosis not present

## 2018-09-30 LAB — COMPREHENSIVE METABOLIC PANEL
ALT: 52 U/L — ABNORMAL HIGH (ref 0–44)
AST: 47 U/L — ABNORMAL HIGH (ref 15–41)
Albumin: 3.8 g/dL (ref 3.5–5.0)
Alkaline Phosphatase: 62 U/L (ref 38–126)
Anion gap: 13 (ref 5–15)
BUN: 7 mg/dL (ref 6–20)
CALCIUM: 8.9 mg/dL (ref 8.9–10.3)
CO2: 18 mmol/L — AB (ref 22–32)
Chloride: 110 mmol/L (ref 98–111)
Creatinine, Ser: 1.06 mg/dL (ref 0.61–1.24)
GFR calc Af Amer: >60 mL/min (ref >60)
GFR calc non Af Amer: >60 mL/min (ref >60)
Glucose, Bld: 66 mg/dL — ABNORMAL LOW (ref 70–99)
Potassium: 4 mmol/L (ref 3.5–5.1)
Sodium: 141 mmol/L (ref 135–145)
Total Bilirubin: 2.4 mg/dL — ABNORMAL HIGH (ref 0.3–1.2)
Total Protein: 7.6 g/dL (ref 6.5–8.1)

## 2018-09-30 LAB — CBC WITH DIFFERENTIAL/PLATELET
Abs Immature Granulocytes: 0.35 10*3/uL — ABNORMAL HIGH (ref 0.00–0.07)
BASOS PCT: 0 %
Basophils Absolute: 0.1 10*3/uL (ref 0.0–0.1)
Eosinophils Absolute: 0.1 10*3/uL (ref 0.0–0.5)
Eosinophils Relative: 1 %
HEMATOCRIT: 19.6 % — AB (ref 39.0–52.0)
Hemoglobin: 6.8 g/dL — CL (ref 13.0–17.0)
Immature Granulocytes: 2 %
Lymphocytes Relative: 32 %
Lymphs Abs: 5.9 10*3/uL — ABNORMAL HIGH (ref 0.7–4.0)
MCH: 31.6 pg (ref 26.0–34.0)
MCHC: 34.7 g/dL (ref 30.0–36.0)
MCV: 91.2 fL (ref 80.0–100.0)
Monocytes Absolute: 1.5 10*3/uL — ABNORMAL HIGH (ref 0.1–1.0)
Monocytes Relative: 8 %
Neutro Abs: 10.3 10*3/uL — ABNORMAL HIGH (ref 1.7–7.7)
Neutrophils Relative %: 57 %
Platelets: 434 10*3/uL — ABNORMAL HIGH (ref 150–400)
RBC: 2.15 MIL/uL — AB (ref 4.22–5.81)
RDW: 22.7 % — ABNORMAL HIGH (ref 11.5–15.5)
WBC: 18.2 10*3/uL — ABNORMAL HIGH (ref 4.0–10.5)
nRBC: 7 % — ABNORMAL HIGH (ref 0.0–0.2)

## 2018-09-30 LAB — SALICYLATE LEVEL: Salicylate Lvl: <7 mg/dL (ref 2.8–30.0)

## 2018-09-30 LAB — I-STAT TROPONIN, ED: Troponin i, poc: 0 ng/mL (ref 0.00–0.08)

## 2018-09-30 LAB — BASIC METABOLIC PANEL
Anion gap: 6 (ref 5–15)
BUN: 5 mg/dL — ABNORMAL LOW (ref 6–20)
CO2: 24 mmol/L (ref 22–32)
Calcium: 8.5 mg/dL — ABNORMAL LOW (ref 8.9–10.3)
Chloride: 108 mmol/L (ref 98–111)
Creatinine, Ser: 0.87 mg/dL (ref 0.61–1.24)
GFR calc Af Amer: >60 mL/min (ref >60)
GFR calc non Af Amer: >60 mL/min (ref >60)
Glucose, Bld: 135 mg/dL — ABNORMAL HIGH (ref 70–99)
Potassium: 3.3 mmol/L — ABNORMAL LOW (ref 3.5–5.1)
Sodium: 138 mmol/L (ref 135–145)

## 2018-09-30 LAB — ACETAMINOPHEN LEVEL: Acetaminophen (Tylenol), Serum: <10 ug/mL — ABNORMAL LOW (ref 10–30)

## 2018-09-30 LAB — CBG MONITORING, ED
GLUCOSE-CAPILLARY: 93 mg/dL (ref 70–99)
Glucose-Capillary: 113 mg/dL — ABNORMAL HIGH (ref 70–99)

## 2018-09-30 LAB — ETHANOL: Alcohol, Ethyl (B): <10 mg/dL (ref <10)

## 2018-09-30 LAB — TROPONIN I
Troponin I: <0.03 ng/mL (ref <0.03)
Troponin I: <0.03 ng/mL (ref <0.03)

## 2018-09-30 LAB — CK: Total CK: 306 U/L (ref 49–397)

## 2018-09-30 MED ORDER — DEXTROSE 50 % IV SOLN
50.0000 mL | Freq: Once | INTRAVENOUS | Status: AC
  Administered 2018-09-30: 50 mL via INTRAVENOUS
  Filled 2018-09-30: qty 50

## 2018-09-30 NOTE — Discharge Instructions (Signed)
Stop using heroin and other illicit drugs.  Follow-up with your primary doctor.  Return to the ED if develop new or worsening symptoms.

## 2018-09-30 NOTE — ED Provider Notes (Signed)
[facility] EMERGENCY DEPARTMENT Provider Note   CSN: [id number] Arrival date & time:        History   Chief Complaint Chief Complaint  Patient presents with  . Altered Mental Status    HPI Jeffrey Morrison is a 23 y.o. male.  Level 5 caveat for altered mental status.  Patient brought in by police and EMS.  He was reportedly running from the police and then went unresponsive.  There is no documented trauma.  He admitted to EMS he was snorting meth and heroin as well as injecting heroin.  He was given 2 mg of Narcan by EMS with some improvement.  He had slowed respirations and slowed mental status and pinpoint pupils on EMS arrival.  Patient is awake and answering a few questions at this time.  He admits to a history of sickle cell disease.  He complains of diffuse pain all over.  Denies any alcohol use.  The history is provided by the patient, the EMS personnel and the police. The history is limited by the condition of the patient.  Altered Mental Status    Past Medical History:  Diagnosis Date  . Chronic lower back pain   . Sickle cell crisis [facility])     Patient Active Problem List   Diagnosis Date Noted  . Community acquired pneumonia   . Polysubstance abuse ([place]) 07/12/2018  . Homelessness 07/12/2018  . Wheezing on expiration 07/12/2018  . Cough 07/12/2018  . Dysuria 07/12/2018  . Hb-SS disease with crisis ([place])   . Wheezing   . Sickle cell crisis ([place]) 07/10/2018  . Sickle cell pain crisis ([place]) 04/21/2018  . CMV (cytomegalovirus) ([place]) 08/27/2017  . Erectile dysfunction 08/27/2017  . Depression 08/25/2017  . OSA (obstructive sleep apnea)   . Anemia of chronic disease     Past Surgical History:  Procedure Laterality Date  . SPLENECTOMY          Home Medications    Prior to Admission medications   Medication Sig Start Date End Date Taking? Authorizing Provider  acetaminophen (TYLENOL) 325 MG tablet Take 2 tablets (650 mg total) by mouth  every 6 (six) hours as needed for headache. 07/16/18   Dorena Dew, FNP  buPROPion (WELLBUTRIN XL) 150 MG 24 hr tablet Take 1 tablet (150 mg total) by mouth daily. 07/16/18   Dorena Dew, FNP  citalopram (CELEXA) 40 MG tablet Take 1 tablet (40 mg total) by mouth daily. 07/16/18   Dorena Dew, FNP  folic acid (FOLVITE) 1 MG tablet Take 1 tablet (1 mg total) by mouth daily. 07/16/18   Dorena Dew, FNP  gabapentin (NEURONTIN) 300 MG capsule Take 1 capsule (300 mg total) by mouth 3 (three) times daily. 07/16/18   Dorena Dew, FNP  hydroxyurea (HYDREA) 500 MG capsule Take 1 capsule (500 mg total) by mouth 2 (two) times daily. May take with food to minimize GI side effects. 07/16/18   Dorena Dew, FNP  ibuprofen (ADVIL,MOTRIN) 800 MG tablet Take 1 tablet (800 mg total) by mouth every 8 (eight) hours as needed (sickle aching cell pain and inflammation). 07/16/18   Dorena Dew, FNP    Family History Family History  Problem Relation Age of Onset  . Diabetes Mother     Social History Social History   Tobacco Use  . Smoking status: Current Every Day Smoker    Packs/day: 0.50    Types: Cigarettes  . Smokeless tobacco: Never Used  Substance Use Topics  . Alcohol use: No  . Drug use: Yes    Types: IV, Methamphetamines    Comment: heroin      Allergies   Patient has no known allergies.   Review of Systems Review of Systems  Unable to perform ROS: Mental status change     Physical Exam Updated Vital Signs BP 121/80   Pulse 81   Temp 97.7 F (36.5 C) (Oral)   Resp 17   SpO2 94%   Physical Exam Vitals signs and nursing note reviewed.  Constitutional:      General: He is not in acute distress.    Appearance: Normal appearance. He is well-developed and normal weight.     Comments: Slow to respond, nasal airway in place  HENT:     Head: Normocephalic and atraumatic.     Ears:     Comments: No septal hematoma or hemotympanum    Nose: Nose  normal. No rhinorrhea.     Mouth/Throat:     Pharynx: No oropharyngeal exudate.  Eyes:     Conjunctiva/sclera: Conjunctivae normal.     Pupils: Pupils are equal, round, and reactive to light.     Comments: Constricted pupils bilaterally  Neck:     Musculoskeletal: Normal range of motion and neck supple.     Comments: No meningismus. No C spine tenderness Cardiovascular:     Rate and Rhythm: Normal rate and regular rhythm.     Heart sounds: Normal heart sounds. No murmur.  Pulmonary:     Effort: Pulmonary effort is normal. No respiratory distress.     Breath sounds: Normal breath sounds.  Abdominal:     Palpations: Abdomen is soft.     Tenderness: There is no abdominal tenderness. There is no guarding or rebound.  Musculoskeletal: Normal range of motion.        General: No tenderness.     Comments: No T or L-spine tenderness Moves all extremities equally  Skin:    General: Skin is warm.     Capillary Refill: Capillary refill takes less than 2 seconds.  Neurological:     Mental Status: He is alert and oriented to person, place, and time.     Cranial Nerves: No cranial nerve deficit.     Motor: No abnormal muscle tone.     Coordination: Coordination normal.     Comments: Slow to respond. Moves all extremities equally.  Psychiatric:        Behavior: Behavior normal.      ED Treatments / Results  Labs (all labs ordered are listed, but only abnormal results are displayed) Labs Reviewed  CBC WITH DIFFERENTIAL/PLATELET - Abnormal; Notable for the following components:      Result Value   WBC 18.2 (*)    RBC 2.15 (*)    Hemoglobin 6.8 (*)    HCT 19.6 (*)    RDW 22.7 (*)    Platelets 434 (*)    nRBC 7.0 (*)    Neutro Abs 10.3 (*)    Lymphs Abs 5.9 (*)    Monocytes Absolute 1.5 (*)    Abs Immature Granulocytes 0.35 (*)    All other components within normal limits  COMPREHENSIVE METABOLIC PANEL - Abnormal; Notable for the following components:   CO2 18 (*)    Glucose,  Bld 66 (*)    AST 47 (*)    ALT 52 (*)    Total Bilirubin 2.4 (*)    All other components within normal limits  ACETAMINOPHEN LEVEL - Abnormal; Notable for the following components:   Acetaminophen (Tylenol), Serum <10 (*)    All other components within normal limits  BASIC METABOLIC PANEL - Abnormal; Notable for the following components:   Potassium 3.3 (*)    Glucose, Bld 135 (*)    BUN 5 (*)    Calcium 8.5 (*)    All other components within normal limits  CBG MONITORING, ED - Abnormal; Notable for the following components:   Glucose-Capillary 113 (*)    All other components within normal limits  ETHANOL  SALICYLATE LEVEL  CK  TROPONIN I  TROPONIN I  CBG MONITORING, ED  I-STAT TROPONIN, ED    EKG EKG Interpretation  Date/Time:  Wednesday September 29 2018 23:57:25 EST Ventricular Rate:  83 PR Interval:    QRS Duration: 93 QT Interval:  356 QTC Calculation: 419 R Axis:   69 Text Interpretation:  Sinus rhythm LVH by voltage No significant change was found Confirmed by Ezequiel Essex [phone]) on 09/30/2018 12:05:01 AM   Radiology Ct Head Wo Contrast  Result Date: 09/30/2018 CLINICAL DATA:  Unresponsive, drug use EXAM: CT HEAD WITHOUT CONTRAST CT CERVICAL SPINE WITHOUT CONTRAST TECHNIQUE: Multidetector CT imaging of the head and cervical spine was performed following the standard protocol without intravenous contrast. Multiplanar CT image reconstructions of the cervical spine were also generated. COMPARISON:  CT head dated 08/25/2017. FINDINGS: CT HEAD FINDINGS Brain: No evidence of acute infarction, hemorrhage, hydrocephalus, extra-axial collection or mass lesion/mass effect. Vascular: No hyperdense vessel or unexpected calcification. Skull: Normal. Negative for fracture or focal lesion. Sinuses/Orbits: The visualized paranasal sinuses are essentially clear. The mastoid air cells are unopacified. Other: None. CT CERVICAL SPINE FINDINGS Alignment: Normal cervical lordosis. Skull  base and vertebrae: No acute fracture. No primary bone lesion or focal pathologic process. Soft tissues and spinal canal: No prevertebral fluid or swelling. No visible canal hematoma. Disc levels: Vertebral body heights and intervertebral disc spaces are maintained. Spinal canal is patent. Upper chest: Visualized lung apices are clear. Other: Visualized thyroid is unremarkable. IMPRESSION: Normal head CT. Normal cervical spine CT. Electronically Signed   By: Julian Hy M.D.   On: 09/30/2018 00:32   Ct Cervical Spine Wo Contrast  Result Date: 09/30/2018 CLINICAL DATA:  Unresponsive, drug use EXAM: CT HEAD WITHOUT CONTRAST CT CERVICAL SPINE WITHOUT CONTRAST TECHNIQUE: Multidetector CT imaging of the head and cervical spine was performed following the standard protocol without intravenous contrast. Multiplanar CT image reconstructions of the cervical spine were also generated. COMPARISON:  CT head dated 08/25/2017. FINDINGS: CT HEAD FINDINGS Brain: No evidence of acute infarction, hemorrhage, hydrocephalus, extra-axial collection or mass lesion/mass effect. Vascular: No hyperdense vessel or unexpected calcification. Skull: Normal. Negative for fracture or focal lesion. Sinuses/Orbits: The visualized paranasal sinuses are essentially clear. The mastoid air cells are unopacified. Other: None. CT CERVICAL SPINE FINDINGS Alignment: Normal cervical lordosis. Skull base and vertebrae: No acute fracture. No primary bone lesion or focal pathologic process. Soft tissues and spinal canal: No prevertebral fluid or swelling. No visible canal hematoma. Disc levels: Vertebral body heights and intervertebral disc spaces are maintained. Spinal canal is patent. Upper chest: Visualized lung apices are clear. Other: Visualized thyroid is unremarkable. IMPRESSION: Normal head CT. Normal cervical spine CT. Electronically Signed   By: Julian Hy M.D.   On: 09/30/2018 00:32   Dg Chest Portable 1 View  Result Date:  09/30/2018 CLINICAL DATA:  Unresponsive, drug use EXAM: PORTABLE CHEST 1 VIEW COMPARISON:  07/04/2018 FINDINGS:  Lungs are clear.  No pleural effusion or pneumothorax. The heart is normal in size. IMPRESSION: No evidence of acute cardiopulmonary disease. Electronically Signed   By: Julian Hy M.D.   On: 09/30/2018 00:32    Procedures Procedures (including critical care time)  Medications Ordered in ED Medications  naloxone (NARCAN) 2 MG/2ML injection (has no administration in time range)  lactated ringers bolus 1,000 mL (has no administration in time range)  ammonia inhalant (  Given 09/30/18 0000)     Initial Impression / Assessment and Plan / ED Course  I have reviewed the triage vital signs and the nursing notes.  Pertinent labs & imaging results that were available during my care of the patient were reviewed by me and considered in my medical decision making (see chart for details).    Patient with altered mental status after running from the police.  Apparently did meth and heroin tonight.  Received Narcan by EMS.  He is protecting his airway and responding to some pain.  He is moving all his extremities.  Nasal airway intact.  Patient admits to using heroin as well as methamphetamine tonight.  No known trauma.  Labs show mild metabolic acidosis.  He has stable anemia.  Leukocytosis is chronic.  CT head and C-spine are negative. Hypoglycemia treated.  Patient observed in the ED and hydrated.  He is becoming more alert and awake. has Not required any further Narcan.  He admits to using both methamphetamine as well as heroin.  Denies any suicide attempt.  Complains of pain in his head, chest and back.  States this chest pain is worse with palpation.  Dissimilar to his usual sickle cell pain.  No cough or fever. CXR negative. Serial troponins negative. CBG improved.  6 AM.  Patient is awake and alert.  He is tolerating p.o. and ambulatory.  His traumatic imaging is negative.  He  is complaining of chest pain but this is reproducible to palpation.  Chest x-ray is negative.  No hypoxia, cough or fever.  No evidence of acute chest syndrome.  Troponin negative x2.  Patient has been observed in the ED >6 hours and has not required any further narcan. He was able to be liberated from supplemental oxygen and will be discharged in police custody.   Final Clinical Impressions(s) / ED Diagnoses   Final diagnoses:  Polysubstance abuse [facility])    ED Discharge Orders    None       Ezequiel Essex, MD 10/01/18 [phone]

## 2018-09-30 NOTE — ED Notes (Signed)
Pt sat up in the bed and given ginger ale to drink. Will ambulate pt soon

## 2018-09-30 NOTE — ED Notes (Signed)
NPA and oxygen removed, pt ambulated in hall without difficulty

## 2018-10-05 ENCOUNTER — Encounter (HOSPITAL_COMMUNITY): Payer: Self-pay | Admitting: Emergency Medicine

## 2018-10-05 ENCOUNTER — Other Ambulatory Visit: Payer: Self-pay

## 2018-10-05 ENCOUNTER — Emergency Department (HOSPITAL_COMMUNITY)
Admission: EM | Admit: 2018-10-05 | Discharge: 2018-10-05 | Payer: Medicare Other | Attending: Emergency Medicine | Admitting: Emergency Medicine

## 2018-10-05 DIAGNOSIS — F17210 Nicotine dependence, cigarettes, uncomplicated: Secondary | ICD-10-CM | POA: Insufficient documentation

## 2018-10-05 DIAGNOSIS — T7621XA Adult sexual abuse, suspected, initial encounter: Secondary | ICD-10-CM | POA: Diagnosis not present

## 2018-10-05 DIAGNOSIS — Z79899 Other long term (current) drug therapy: Secondary | ICD-10-CM | POA: Diagnosis not present

## 2018-10-05 DIAGNOSIS — T7421XA Adult sexual abuse, confirmed, initial encounter: Secondary | ICD-10-CM

## 2018-10-05 LAB — COMPREHENSIVE METABOLIC PANEL
ALT: 53 U/L — ABNORMAL HIGH (ref 0–44)
AST: 43 U/L — ABNORMAL HIGH (ref 15–41)
Albumin: 3.7 g/dL (ref 3.5–5.0)
Alkaline Phosphatase: 54 U/L (ref 38–126)
Anion gap: 9 (ref 5–15)
BUN: 9 mg/dL (ref 6–20)
CHLORIDE: 107 mmol/L (ref 98–111)
CO2: 23 mmol/L (ref 22–32)
Calcium: 8.7 mg/dL — ABNORMAL LOW (ref 8.9–10.3)
Creatinine, Ser: 0.82 mg/dL (ref 0.61–1.24)
GFR calc Af Amer: >60 mL/min (ref >60)
GFR calc non Af Amer: >60 mL/min (ref >60)
Glucose, Bld: 90 mg/dL (ref 70–99)
POTASSIUM: 4.5 mmol/L (ref 3.5–5.1)
Sodium: 139 mmol/L (ref 135–145)
Total Bilirubin: 2.4 mg/dL — ABNORMAL HIGH (ref 0.3–1.2)
Total Protein: 6.9 g/dL (ref 6.5–8.1)

## 2018-10-05 LAB — RAPID HIV SCREEN (HIV 1/2 AB+AG)
HIV 1/2 Antibodies: NONREACTIVE
HIV-1 P24 ANTIGEN - HIV24: NONREACTIVE

## 2018-10-05 MED ORDER — ELVITEG-COBIC-EMTRICIT-TENOFAF 150-150-200-10 MG PREPACK
5.0000 | ORAL_TABLET | Freq: Once | ORAL | Status: AC
  Administered 2018-10-05: 5 via ORAL
  Filled 2018-10-05: qty 5

## 2018-10-05 MED ORDER — AZITHROMYCIN 250 MG PO TABS
1000.0000 mg | ORAL_TABLET | Freq: Once | ORAL | Status: AC
  Administered 2018-10-05: 1000 mg via ORAL
  Filled 2018-10-05: qty 4

## 2018-10-05 MED ORDER — ELVITEG-COBIC-EMTRICIT-TENOFAF 150-150-200-10 MG PREPACK
ORAL_TABLET | ORAL | Status: AC
  Administered 2018-10-05: 5 via ORAL
  Filled 2018-10-05: qty 1

## 2018-10-05 MED ORDER — ELVITEG-COBIC-EMTRICIT-TENOFAF 150-150-200-10 MG PO TABS: 1.0000 | ORAL_TABLET | Freq: Every day | ORAL | 0 refills | Status: DC

## 2018-10-05 MED ORDER — CEFTRIAXONE SODIUM 250 MG IJ SOLR
250.0000 mg | Freq: Once | INTRAMUSCULAR | Status: AC
  Administered 2018-10-05: 250 mg via INTRAMUSCULAR
  Filled 2018-10-05: qty 250

## 2018-10-05 MED ORDER — LIDOCAINE HCL (PF) 1 % IJ SOLN
0.9000 mL | Freq: Once | INTRAMUSCULAR | Status: AC
  Administered 2018-10-05: 0.9 mL
  Filled 2018-10-05: qty 5

## 2018-10-05 MED ORDER — METRONIDAZOLE 500 MG PO TABS
2000.0000 mg | ORAL_TABLET | Freq: Once | ORAL | Status: AC
  Administered 2018-10-05: 2000 mg via ORAL
  Filled 2018-10-05: qty 4

## 2018-10-05 NOTE — Consult Note (Signed)
  [facility] notified that patient will be discharged with four Genvoya tablets, one to be given every evening starting tomorrow. First dose administered prior to discharge today. The additional 25 tablets will be mailed to [facility] c/o medical unit. They are aware to expect them by the end of the week. They are also aware patient will need follow up STI testing in approximately 14 days.   The SANE/FNE Naval [employer]) consult has been completed. The primary or charge RN and provider have been notified. Please contact the SANE/FNE nurse on call (listed in [place]) with any further concerns.

## 2018-10-05 NOTE — ED Notes (Signed)
Patient verbalizes understanding of discharge instructions. Opportunity for questioning and answers were provided. Armband removed by staff, pt discharged from ED.  

## 2018-10-05 NOTE — ED Notes (Signed)
SANE nurse at bedside.

## 2018-10-05 NOTE — Discharge Instructions (Addendum)
Take one Genvoya tablet every evening for the next 30 days. Initial dose was given on 10/05/18 at approx. 18:00.  Subsequent doses should begin tomorrow evening. Administer one tablet around the same time daily. Have follow-up STI testing in approximately 14 days.  For all of the medications you have received:  AVOID HAVING SEXUAL CONTACT UNTIL FOLLOW UP STI TESTING IS DONE.  IF YOU HAVE CONTACTED A SEXUALLY TRANSMITTED INFECTION, YOUR PARTNER CAN BECOME INFECTED.  Do not share any of these medications with others.  Store at room temperature, away from light and moisture.  Do not store in the bathroom.  Keep all medicines away from children and pets.  Do not flush medications down the toilet or pour them in the drain.  Properly discard (contact a pharmacy) when a medication is expired or no longer needed.  Azithromycin tablets What is this medicine? AZITHROMYCIN (az ith roe MYE sin) is a macrolide antibiotic. It is used to treat or prevent certain kinds of bacterial infections. It will not work for colds, flu, or other viral infections. This medicine may be used for other purposes; ask your health care provider or pharmacist if you have questions. COMMON BRAND NAME(S): Zithromax, Zithromax Tri-Pak, Zithromax Z-Pak What should I tell my health care provider before I take this medicine? They need to know if you have any of these conditions: -kidney disease -liver disease -irregular heartbeat or heart disease -an unusual or allergic reaction to azithromycin, erythromycin, other macrolide antibiotics, foods, dyes, or preservatives -pregnant or trying to get pregnant -breast-feeding How should I use this medicine? Take this medicine by mouth with a full glass of water. Follow the directions on the prescription label. The tablets can be taken with food or on an empty stomach. If the medicine upsets your stomach, take it with food. Take your medicine at regular intervals. Do not take your medicine  more often than directed. Take all of your medicine as directed even if you think your are better. Do not skip doses or stop your medicine early. Talk to your pediatrician regarding the use of this medicine in children. While this drug may be prescribed for children as young as 6 months for selected conditions, precautions do apply. Overdosage: If you think you have taken too much of this medicine contact a poison control center or emergency room at once. NOTE: This medicine is only for you. Do not share this medicine with others. What if I miss a dose? If you miss a dose, take it as soon as you can. If it is almost time for your next dose, take only that dose. Do not take double or extra doses. What may interact with this medicine? Do not take this medicine with any of the following medications: -lincomycin This medicine may also interact with the following medications: -amiodarone -antacids -birth control pills -cyclosporine -digoxin -magnesium -nelfinavir -phenytoin -warfarin This list may not describe all possible interactions. Give your health care provider a list of all the medicines, herbs, non-prescription drugs, or dietary supplements you use. Also tell them if you smoke, drink alcohol, or use illegal drugs. Some items may interact with your medicine. What should I watch for while using this medicine? Tell your doctor or healthcare professional if your symptoms do not start to get better or if they get worse. Do not treat diarrhea with over the counter products. Contact your doctor if you have diarrhea that lasts more than 2 days or if it is severe and watery. This medicine can  make you more sensitive to the sun. Keep out of the sun. If you cannot avoid being in the sun, wear protective clothing and use sunscreen. Do not use sun lamps or tanning beds/booths. What side effects may I notice from receiving this medicine? Side effects that you should report to your doctor or health care  professional as soon as possible: -allergic reactions like skin rash, itching or hives, swelling of the face, lips, or tongue -confusion, nightmares or hallucinations -dark urine -difficulty breathing -hearing loss -irregular heartbeat or chest pain -pain or difficulty passing urine -redness, blistering, peeling or loosening of the skin, including inside the mouth -white patches or sores in the mouth -yellowing of the eyes or skin Side effects that usually do not require medical attention (report to your doctor or health care professional if they continue or are bothersome): -diarrhea -dizziness, drowsiness -headache -stomach upset or vomiting -tooth discoloration -vaginal irritation This list may not describe all possible side effects. Call your doctor for medical advice about side effects. You may report side effects to FDA at 1-800-FDA-1088. Where should I keep my medicine? Keep out of the reach of children. Store at room temperature between 15 and 30 degrees C (59 and 86 degrees F). Throw away any unused medicine after the expiration date. NOTE: This sheet is a summary. It may not cover all possible information. If you have questions about this medicine, talk to your doctor, pharmacist, or health care provider.  2017 [employer]/Gold Standard (2015-10-30 15:26:03)  Metronidazole (4 pills at once) Also known as:  Flagyl   Metronidazole tablets or capsules What is this medicine? METRONIDAZOLE (me troe NI da zole) is an antiinfective. It is used to treat certain kinds of bacterial and protozoal infections. It will not work for colds, flu, or other viral infections. This medicine may be used for other purposes; ask your health care provider or pharmacist if you have questions. COMMON BRAND NAME(S): Flagyl What should I tell my health care provider before I take this medicine? They need to know if you have any of these conditions: -anemia or other blood disorders -disease of the  nervous system -fungal or yeast infection -if you drink alcohol containing drinks -liver disease -seizures -an unusual or allergic reaction to metronidazole, or other medicines, foods, dyes, or preservatives -pregnant or trying to get pregnant -breast-feeding How should I use this medicine? Take this medicine by mouth with a full glass of water. Follow the directions on the prescription label. Take your medicine at regular intervals. Do not take your medicine more often than directed. Take all of your medicine as directed even if you think you are better. Do not skip doses or stop your medicine early. Talk to your pediatrician regarding the use of this medicine in children. Special care may be needed. Overdosage: If you think you have taken too much of this medicine contact a poison control center or emergency room at once. NOTE: This medicine is only for you. Do not share this medicine with others. What if I miss a dose? If you miss a dose, take it as soon as you can. If it is almost time for your next dose, take only that dose. Do not take double or extra doses. What may interact with this medicine? Do not take this medicine with any of the following medications: -alcohol or any product that contains alcohol -amprenavir oral solution -cisapride -disulfiram -dofetilide -dronedarone -paclitaxel injection -pimozide -ritonavir oral solution -sertraline oral solution -sulfamethoxazole-trimethoprim injection -thioridazine -ziprasidone This medicine  may also interact with the following medications: -birth control pills -cimetidine -lithium -other medicines that prolong the QT interval (cause an abnormal heart rhythm) -phenobarbital -phenytoin -warfarin This list may not describe all possible interactions. Give your health care provider a list of all the medicines, herbs, non-prescription drugs, or dietary supplements you use. Also tell them if you smoke, drink alcohol, or use illegal  drugs. Some items may interact with your medicine. What should I watch for while using this medicine? Tell your doctor or health care professional if your symptoms do not improve or if they get worse. You may get drowsy or dizzy. Do not drive, use machinery, or do anything that needs mental alertness until you know how this medicine affects you. Do not stand or sit up quickly, especially if you are an older patient. This reduces the risk of dizzy or fainting spells. Avoid alcoholic drinks while you are taking this medicine and for three days afterward. Alcohol may make you feel dizzy, sick, or flushed. If you are being treated for a sexually transmitted disease, avoid sexual contact until you have finished your treatment. Your sexual partner may also need treatment. What side effects may I notice from receiving this medicine? Side effects that you should report to your doctor or health care professional as soon as possible: -allergic reactions like skin rash or hives, swelling of the face, lips, or tongue -confusion, clumsiness -difficulty speaking -discolored or sore mouth -dizziness -fever, infection -numbness, tingling, pain or weakness in the hands or feet -trouble passing urine or change in the amount of urine -redness, blistering, peeling or loosening of the skin, including inside the mouth -seizures -unusually weak or tired -vaginal irritation, dryness, or discharge Side effects that usually do not require medical attention (report to your doctor or health care professional if they continue or are bothersome): -diarrhea -headache -irritability -metallic taste -nausea -stomach pain or cramps -trouble sleeping This list may not describe all possible side effects. Call your doctor for medical advice about side effects. You may report side effects to FDA at 1-800-FDA-1088. Where should I keep my medicine? Keep out of the reach of children. Store at room temperature below 25 degrees  C (77 degrees F). Protect from light. Keep container tightly closed. Throw away any unused medicine after the expiration date. NOTE: This sheet is a summary. It may not cover all possible information. If you have questions about this medicine, talk to your doctor, pharmacist, or health care provider.  2017 Else[employer]/Gold Standard (2013-04-08 14:08:39)  Ceftriaxone (Injection/Shot) Also known as:  Rocephin  Ceftriaxone injection What is this medicine? CEFTRIAXONE (sef try AX one) is a cephalosporin antibiotic. It is used to treat certain kinds of bacterial infections. It will not work for colds, flu, or other viral infections. This medicine may be used for other purposes; ask your health care provider or pharmacist if you have questions. COMMON BRAND NAME(S): Rocephin What should I tell my health care provider before I take this medicine? They need to know if you have any of these conditions: -any chronic illness -bowel disease, like colitis -both kidney and liver disease -high bilirubin level in newborn patients -an unusual or allergic reaction to ceftriaxone, other cephalosporin or penicillin antibiotics, foods, dyes, or preservatives -pregnant or trying to get pregnant -breast-feeding How should I use this medicine? This medicine is injected into a muscle or infused it into a vein. It is usually given in a medical office or clinic. If you are to give this medicine  you will be taught how to inject it. Follow instructions carefully. Use your doses at regular intervals. Do not take your medicine more often than directed. Do not skip doses or stop your medicine early even if you feel better. Do not stop taking except on your doctor's advice. Talk to your pediatrician regarding the use of this medicine in children. Special care may be needed. Overdosage: If you think you have taken too much of this medicine contact a poison control center or emergency room at once. NOTE: This medicine is only  for you. Do not share this medicine with others. What if I miss a dose? If you miss a dose, take it as soon as you can. If it is almost time for your next dose, take only that dose. Do not take double or extra doses. What may interact with this medicine? Do not take this medicine with any of the following medications: -intravenous calcium This medicine may also interact with the following medications: -birth control pills This list may not describe all possible interactions. Give your health care provider a list of all the medicines, herbs, non-prescription drugs, or dietary supplements you use. Also tell them if you smoke, drink alcohol, or use illegal drugs. Some items may interact with your medicine. What should I watch for while using this medicine? Tell your doctor or health care professional if your symptoms do not improve or if they get worse. Do not treat diarrhea with over the counter products. Contact your doctor if you have diarrhea that lasts more than 2 days or if it is severe and watery. If you are being treated for a sexually transmitted disease, avoid sexual contact until you have finished your treatment. Having sex can infect your sexual partner. Calcium may bind to this medicine and cause lung or kidney problems. Avoid calcium products while taking this medicine and for 48 hours after taking the last dose of this medicine. What side effects may I notice from receiving this medicine? Side effects that you should report to your doctor or health care professional as soon as possible: -allergic reactions like skin rash, itching or hives, swelling of the face, lips, or tongue -breathing problems -fever, chills -irregular heartbeat -pain when passing urine -seizures -stomach pain, cramps -unusual bleeding, bruising -unusually weak or tired Side effects that usually do not require medical attention (report to your doctor or health care professional if they continue or are  bothersome): -diarrhea -dizzy, drowsy -headache -nausea, vomiting -pain, swelling, irritation where injected -stomach upset -sweating This list may not describe all possible side effects. Call your doctor for medical advice about side effects. You may report side effects to FDA at 1-800-FDA-1088. Where should I keep my medicine? Keep out of the reach of children. Store at room temperature below 25 degrees C (77 degrees F). Protect from light. Throw away any unused vials after the expiration date. NOTE: This sheet is a summary. It may not cover all possible information. If you have questions about this medicine, talk to your doctor, pharmacist, or health care provider.  2017 Elsevier/Gold Standard (2014-03-20 09:14:54)    Cobicistat; Elvitegravir; Emtricitabine; Tenofovir Alafenamide oral tablets   What is this medicine? COBICISTAT; ELVITEGRAVIR; EMTRICITABINE; TENOFOVIR ALAFENAMIDE (koe BIS i stat; el vye TEG ra veer; em tri SIT uh bean; te NOE fo veer) is three antiretroviral medicines and a medication booster in one tablet. It is used to treat HIV. This medicine is not a cure for HIV. It will not stop the spread  of HIV to others. This medicine may be used for other purposes; ask your health care provider or pharmacist if you have questions. COMMON BRAND NAME(S): Genvoya  What should I tell my health care provider before I take this medicine? They need to know if you have any of these conditions: -kidney disease -liver disease -an unusual or allergic reaction to cobicistat, elvitegravir, emtricitabine, tenofovir, other medicines, foods, dyes, or preservatives -pregnant or trying to get pregnant -breast-feeding  How should I use this medicine? Take this medicine by mouth with a glass of water. Follow the directions on the prescription label. Take this medicine with food. Take your medicine at regular intervals. Do not take your medicine more often than directed. For your anti-HIV  therapy to work as well as possible, take each dose exactly as prescribed. Do not skip doses or stop your medicine even if you feel better. Skipping doses may make the HIV virus resistant to this medicine and other medicines. Do not stop taking except on your doctor's advice. Talk to your pediatrician regarding the use of this medicine in children. While this drug may be prescribed for selected conditions, precautions do apply. Overdosage: If you think you have taken too much of this medicine contact a poison control center or emergency room at once. NOTE: This medicine is only for you. Do not share this medicine with others.  What if I miss a dose? If you miss a dose, take it as soon as you can. If it is almost time for your next dose, take only that dose. Do not take double or extra doses.  What may interact with this medicine? Do not take this medicine with any of the following medications: -adefovir -alfuzosin -certain medicines for seizures like carbamazepine, phenobarbital, phenytoin -cisapride -lumacaftor; ivacaftor -lurasidone -medicines for cholesterol like lovastatin, simvastatin -medicines for headaches like dihydroergotamine, ergotamine, methylergonovine -midazolam -other antiviral medicines for HIV or AIDS -pimozide -rifampin -sildenafil -St. John's wort -triazolam This medicine may also interact with the following medications: -antacids -atorvastatin -bosentan -buprenorphine; naloxone -certain antibiotics like clarithromycin, telithromycin, rifabutin, rifapentine -certain medications for anxiety or sleep like buspirone, clorazepate, diazepam, estazolam, flurazepam, zolpidem -certain medicines for blood pressure or heart disease like amlodipine, diltiazem, felodipine, metoprolol, nicardipine, nifedipine, timolol, verapamil -certain medicines for depression, anxiety, or psychiatric disturbances -certain medicines for erectile dysfunction like avanafil, sildenafil,  tadalafil, vardenafil -certain medicines for fungal infection like itraconazole, ketoconazole, voriconazole -colchicine -cyclosporine -dexamethasone -male hormones, like estrogens and progestins and birth control pills -fluticasone -medicines for infection like acyclovir, cidofovir, valacyclovir, ganciclovir, valganciclovir -medicines for irregular heart beat like amiodarone, bepridil, digoxin, disopyramide, dofetilide, flecainide, lidocaine, mexiletine, propafenone, quinidine -metformin -oxcarbazepine -phenothiazines like perphenazine, risperidone, thioridazine -salmeterol -sirolimus -tacrolimus -warfarin This list may not describe all possible interactions. Give your health care provider a list of all the medicines, herbs, non-prescription drugs, or dietary supplements you use. Also tell them if you smoke, drink alcohol, or use illegal drugs. Some items may interact with your medicine.  What should I watch for while using this medicine? Visit your doctor or health care professional for regular check ups. Discuss any new symptoms with your doctor. You will need to have important blood work done while on this medicine. HIV is spread to others through sexual or blood contact. Talk to your doctor about how to stop the spread of HIV. If you have hepatitis B, talk to your doctor if you plan to stop this medicine. The symptoms of hepatitis B may get worse if you stop  this medicine. Birth control pills may not work properly while you are taking this medicine. Talk to your doctor about using an extra method of birth control. Women who can still have children must use a reliable form of barrier contraception, like a condom.  What side effects may I notice from receiving this medicine? Side effects that you should report to your doctor or health care professional as soon as possible: -allergic reactions like skin rash, itching or hives, swelling of the face, lips, or tongue -breathing  problems -fast, irregular heartbeat -muscle pain or weakness -signs and symptoms of kidney injury like trouble passing urine or change in the amount of urine -signs and symptoms of liver injury like dark yellow or brown urine; general ill feeling or flu-like symptoms; light-colored stools; loss of appetite; right upper belly pain; unusually weak or tired; yellowing of the eyes or skin Side effects that usually do not require medical attention (report to your doctor or health care professional if they continue or are bothersome): -diarrhea -headache -nausea -tiredness This list may not describe all possible side effects. Call your doctor for medical advice about side effects. You may report side effects to FDA at 1-800-FDA-1088.  Where should I keep my medicine? Keep out of the reach of children. Store at room temperature below 30 degrees C (86 degrees F). Throw away any unused medicine after the expiration date. NOTE: This sheet is a summary. It may not cover all possible information. If you have questions about this medicine, talk to your doctor, pharmacist, or health care provider.  2018 [employer]/Gold Standard (2016-06-16 12:54:04)
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
## 2018-10-06 LAB — HEPATITIS B SURFACE ANTIGEN: Hepatitis B Surface Ag: NEGATIVE

## 2018-10-06 LAB — RPR: RPR Ser Ql: NONREACTIVE

## 2018-10-06 LAB — HEPATITIS C ANTIBODY: HCV Ab: <0.1 s/co ratio (ref 0.0–0.9)

## 2018-10-06 MED FILL — GENVOYA TABLET: 150-150-200 | 30 days supply | Qty: 30 | Fill #0
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
## 2018-11-11 ENCOUNTER — Emergency Department (HOSPITAL_COMMUNITY): Payer: Medicare Other

## 2018-11-11 ENCOUNTER — Encounter (HOSPITAL_COMMUNITY): Payer: Self-pay | Admitting: Emergency Medicine

## 2018-11-11 ENCOUNTER — Other Ambulatory Visit: Payer: Self-pay

## 2018-11-11 ENCOUNTER — Inpatient Hospital Stay (HOSPITAL_COMMUNITY)
Admission: EM | Admit: 2018-11-11 | Discharge: 2018-11-16 | DRG: 811 | Payer: Medicare Other | Attending: Internal Medicine | Admitting: Internal Medicine

## 2018-11-11 DIAGNOSIS — R7989 Other specified abnormal findings of blood chemistry: Secondary | ICD-10-CM

## 2018-11-11 DIAGNOSIS — Z79899 Other long term (current) drug therapy: Secondary | ICD-10-CM

## 2018-11-11 DIAGNOSIS — Z716 Tobacco abuse counseling: Secondary | ICD-10-CM

## 2018-11-11 DIAGNOSIS — D72829 Elevated white blood cell count, unspecified: Secondary | ICD-10-CM | POA: Diagnosis present

## 2018-11-11 DIAGNOSIS — Z7151 Drug abuse counseling and surveillance of drug abuser: Secondary | ICD-10-CM

## 2018-11-11 DIAGNOSIS — F32A Depression, unspecified: Secondary | ICD-10-CM | POA: Diagnosis present

## 2018-11-11 DIAGNOSIS — F1190 Opioid use, unspecified, uncomplicated: Secondary | ICD-10-CM | POA: Diagnosis present

## 2018-11-11 DIAGNOSIS — M25519 Pain in unspecified shoulder: Secondary | ICD-10-CM | POA: Diagnosis not present

## 2018-11-11 DIAGNOSIS — K5900 Constipation, unspecified: Secondary | ICD-10-CM | POA: Diagnosis present

## 2018-11-11 DIAGNOSIS — R339 Retention of urine, unspecified: Secondary | ICD-10-CM | POA: Diagnosis present

## 2018-11-11 DIAGNOSIS — D5700 Hb-SS disease with crisis, unspecified: Principal | ICD-10-CM | POA: Diagnosis present

## 2018-11-11 DIAGNOSIS — M545 Low back pain: Secondary | ICD-10-CM | POA: Diagnosis present

## 2018-11-11 DIAGNOSIS — Z720 Tobacco use: Secondary | ICD-10-CM | POA: Diagnosis present

## 2018-11-11 DIAGNOSIS — T7421XD Adult sexual abuse, confirmed, subsequent encounter: Secondary | ICD-10-CM

## 2018-11-11 DIAGNOSIS — R945 Abnormal results of liver function studies: Secondary | ICD-10-CM

## 2018-11-11 DIAGNOSIS — R740 Nonspecific elevation of levels of transaminase and lactic acid dehydrogenase [LDH]: Secondary | ICD-10-CM | POA: Diagnosis not present

## 2018-11-11 DIAGNOSIS — R45851 Suicidal ideations: Secondary | ICD-10-CM | POA: Diagnosis not present

## 2018-11-11 DIAGNOSIS — R52 Pain, unspecified: Secondary | ICD-10-CM | POA: Diagnosis not present

## 2018-11-11 DIAGNOSIS — G894 Chronic pain syndrome: Secondary | ICD-10-CM | POA: Diagnosis present

## 2018-11-11 DIAGNOSIS — K8020 Calculus of gallbladder without cholecystitis without obstruction: Secondary | ICD-10-CM | POA: Diagnosis not present

## 2018-11-11 DIAGNOSIS — Z9081 Acquired absence of spleen: Secondary | ICD-10-CM

## 2018-11-11 DIAGNOSIS — T375X5A Adverse effect of antiviral drugs, initial encounter: Secondary | ICD-10-CM | POA: Diagnosis present

## 2018-11-11 DIAGNOSIS — Z833 Family history of diabetes mellitus: Secondary | ICD-10-CM

## 2018-11-11 DIAGNOSIS — F17210 Nicotine dependence, cigarettes, uncomplicated: Secondary | ICD-10-CM | POA: Diagnosis present

## 2018-11-11 DIAGNOSIS — F1590 Other stimulant use, unspecified, uncomplicated: Secondary | ICD-10-CM | POA: Diagnosis present

## 2018-11-11 DIAGNOSIS — J9691 Respiratory failure, unspecified with hypoxia: Secondary | ICD-10-CM | POA: Diagnosis not present

## 2018-11-11 DIAGNOSIS — R7401 Elevation of levels of liver transaminase levels: Secondary | ICD-10-CM | POA: Diagnosis present

## 2018-11-11 DIAGNOSIS — F329 Major depressive disorder, single episode, unspecified: Secondary | ICD-10-CM | POA: Diagnosis not present

## 2018-11-11 DIAGNOSIS — G4733 Obstructive sleep apnea (adult) (pediatric): Secondary | ICD-10-CM | POA: Diagnosis present

## 2018-11-11 DIAGNOSIS — R451 Restlessness and agitation: Secondary | ICD-10-CM

## 2018-11-11 DIAGNOSIS — R7981 Abnormal blood-gas level: Secondary | ICD-10-CM

## 2018-11-11 DIAGNOSIS — R17 Unspecified jaundice: Secondary | ICD-10-CM

## 2018-11-11 DIAGNOSIS — D638 Anemia in other chronic diseases classified elsewhere: Secondary | ICD-10-CM | POA: Diagnosis present

## 2018-11-11 DIAGNOSIS — R5081 Fever presenting with conditions classified elsewhere: Secondary | ICD-10-CM | POA: Diagnosis not present

## 2018-11-11 DIAGNOSIS — R1033 Periumbilical pain: Secondary | ICD-10-CM | POA: Diagnosis not present

## 2018-11-11 DIAGNOSIS — R6510 Systemic inflammatory response syndrome (SIRS) of non-infectious origin without acute organ dysfunction: Secondary | ICD-10-CM | POA: Diagnosis not present

## 2018-11-11 HISTORY — DX: Major depressive disorder, single episode, unspecified: F32.9

## 2018-11-11 HISTORY — DX: Depression, unspecified: F32.A

## 2018-11-11 HISTORY — DX: Sickle-cell disease without crisis: D57.1

## 2018-11-11 LAB — CBC WITH DIFFERENTIAL/PLATELET
Abs Immature Granulocytes: 0.14 10*3/uL — ABNORMAL HIGH (ref 0.00–0.07)
Basophils Absolute: 0.1 10*3/uL (ref 0.0–0.1)
Basophils Relative: 1 %
Eosinophils Absolute: 0.7 10*3/uL — ABNORMAL HIGH (ref 0.0–0.5)
Eosinophils Relative: 5 %
HCT: 20.3 % — ABNORMAL LOW (ref 39.0–52.0)
Hemoglobin: 7.3 g/dL — ABNORMAL LOW (ref 13.0–17.0)
Immature Granulocytes: 1 %
Lymphocytes Relative: 36 %
Lymphs Abs: 4.4 10*3/uL — ABNORMAL HIGH (ref 0.7–4.0)
MCH: 39.9 pg — ABNORMAL HIGH (ref 26.0–34.0)
MCHC: 36 g/dL (ref 30.0–36.0)
MCV: 110.9 fL — ABNORMAL HIGH (ref 80.0–100.0)
Monocytes Absolute: 1.6 10*3/uL — ABNORMAL HIGH (ref 0.1–1.0)
Monocytes Relative: 13 %
Neutro Abs: 5.6 10*3/uL (ref 1.7–7.7)
Neutrophils Relative %: 44 %
Platelets: 316 10*3/uL (ref 150–400)
RBC: 1.83 MIL/uL — AB (ref 4.22–5.81)
RDW: 23 % — ABNORMAL HIGH (ref 11.5–15.5)
WBC: 12.4 10*3/uL — ABNORMAL HIGH (ref 4.0–10.5)
nRBC: 22 % — ABNORMAL HIGH (ref 0.0–0.2)

## 2018-11-11 LAB — RETICULOCYTES
Immature Retic Fract: 36.4 % — ABNORMAL HIGH (ref 2.3–15.9)
RBC.: 1.83 MIL/uL — ABNORMAL LOW (ref 4.22–5.81)
Retic Count, Absolute: 256.5 10*3/uL — ABNORMAL HIGH (ref 19.0–186.0)
Retic Ct Pct: 13.5 % — ABNORMAL HIGH (ref 0.4–3.1)

## 2018-11-11 LAB — COMPREHENSIVE METABOLIC PANEL
ALT: 279 U/L — AB (ref 0–44)
AST: 178 U/L — ABNORMAL HIGH (ref 15–41)
Albumin: 4.2 g/dL (ref 3.5–5.0)
Alkaline Phosphatase: 59 U/L (ref 38–126)
Anion gap: 6 (ref 5–15)
BUN: 8 mg/dL (ref 6–20)
CO2: 27 mmol/L (ref 22–32)
CREATININE: 0.66 mg/dL (ref 0.61–1.24)
Calcium: 8.7 mg/dL — ABNORMAL LOW (ref 8.9–10.3)
Chloride: 108 mmol/L (ref 98–111)
GFR calc non Af Amer: >60 mL/min (ref >60)
Glucose, Bld: 102 mg/dL — ABNORMAL HIGH (ref 70–99)
Potassium: 4.4 mmol/L (ref 3.5–5.1)
Sodium: 141 mmol/L (ref 135–145)
Total Bilirubin: 6.4 mg/dL — ABNORMAL HIGH (ref 0.3–1.2)
Total Protein: 7.3 g/dL (ref 6.5–8.1)

## 2018-11-11 LAB — ACETAMINOPHEN LEVEL: Acetaminophen (Tylenol), Serum: <10 ug/mL — ABNORMAL LOW (ref 10–30)

## 2018-11-11 LAB — LIPASE, BLOOD: Lipase: 36 U/L (ref 11–51)

## 2018-11-11 LAB — POC OCCULT BLOOD, ED: FECAL OCCULT BLD: NEGATIVE

## 2018-11-11 MED ORDER — HYDROMORPHONE HCL 1 MG/ML IJ SOLN: 1.0000 mg | INTRAMUSCULAR | Status: DC

## 2018-11-11 MED ORDER — ENOXAPARIN SODIUM 40 MG/0.4ML ~~LOC~~ SOLN: 40.0000 mg | SUBCUTANEOUS | Status: DC

## 2018-11-11 MED ORDER — HYDROMORPHONE HCL 1 MG/ML IJ SOLN
1.0000 mg | INTRAMUSCULAR | Status: AC
  Administered 2018-11-11: 1 mg via INTRAVENOUS
  Filled 2018-11-11: qty 1

## 2018-11-11 MED ORDER — HYDROMORPHONE HCL 1 MG/ML IJ SOLN: 0.5000 mg | INTRAMUSCULAR | Status: AC

## 2018-11-11 MED ORDER — HYDROMORPHONE HCL 1 MG/ML IJ SOLN
0.5000 mg | INTRAMUSCULAR | Status: AC
  Administered 2018-11-11: 0.5 mg via INTRAVENOUS
  Filled 2018-11-11: qty 1

## 2018-11-11 MED ORDER — IOPAMIDOL (ISOVUE-300) INJECTION 61%
INTRAVENOUS | Status: AC
  Filled 2018-11-11: qty 100

## 2018-11-11 MED ORDER — HYDROMORPHONE 1 MG/ML IV SOLN
INTRAVENOUS | Status: DC
  Administered 2018-11-12: 30 mg via INTRAVENOUS
  Administered 2018-11-12: 3 mg via INTRAVENOUS
  Administered 2018-11-12: 4.32 mg via INTRAVENOUS
  Filled 2018-11-11: qty 30

## 2018-11-11 MED ORDER — ONDANSETRON HCL 4 MG/2ML IJ SOLN: 4.0000 mg | Freq: Four times a day (QID) | INTRAMUSCULAR | Status: DC | PRN

## 2018-11-11 MED ORDER — GABAPENTIN 300 MG PO CAPS
300.0000 mg | ORAL_CAPSULE | Freq: Three times a day (TID) | ORAL | Status: DC
  Administered 2018-11-12 – 2018-11-16 (×15): 300 mg via ORAL
  Filled 2018-11-11 (×15): qty 1

## 2018-11-11 MED ORDER — HYDROMORPHONE HCL 1 MG/ML IJ SOLN: 1.0000 mg | INTRAMUSCULAR | Status: AC

## 2018-11-11 MED ORDER — BUPROPION HCL ER (XL) 150 MG PO TB24
150.0000 mg | ORAL_TABLET | Freq: Every day | ORAL | Status: DC
  Administered 2018-11-12 – 2018-11-16 (×5): 150 mg via ORAL
  Filled 2018-11-11 (×5): qty 1

## 2018-11-11 MED ORDER — HYDROXYUREA 500 MG PO CAPS
500.0000 mg | ORAL_CAPSULE | Freq: Two times a day (BID) | ORAL | Status: DC
  Administered 2018-11-12 – 2018-11-13 (×3): 500 mg via ORAL
  Filled 2018-11-11 (×3): qty 1

## 2018-11-11 MED ORDER — NALOXONE HCL 0.4 MG/ML IJ SOLN: 0.4000 mg | INTRAMUSCULAR | Status: DC | PRN

## 2018-11-11 MED ORDER — CITALOPRAM HYDROBROMIDE 20 MG PO TABS
40.0000 mg | ORAL_TABLET | Freq: Every day | ORAL | Status: DC
  Administered 2018-11-12 – 2018-11-16 (×5): 40 mg via ORAL
  Filled 2018-11-11 (×6): qty 2

## 2018-11-11 MED ORDER — ONDANSETRON HCL 4 MG/2ML IJ SOLN
4.0000 mg | INTRAMUSCULAR | Status: DC | PRN
  Administered 2018-11-12: 4 mg via INTRAVENOUS
  Filled 2018-11-11: qty 2

## 2018-11-11 MED ORDER — DIPHENHYDRAMINE HCL 25 MG PO CAPS: 25.0000 mg | ORAL_CAPSULE | ORAL | Status: DC | PRN

## 2018-11-11 MED ORDER — ONDANSETRON HCL 4 MG PO TABS: 4.0000 mg | ORAL_TABLET | Freq: Four times a day (QID) | ORAL | Status: DC | PRN

## 2018-11-11 MED ORDER — SODIUM CHLORIDE 0.9% FLUSH: 9.0000 mL | INTRAVENOUS | Status: DC | PRN

## 2018-11-11 MED ORDER — NICOTINE 14 MG/24HR TD PT24: 14.0000 mg | MEDICATED_PATCH | Freq: Every day | TRANSDERMAL | Status: DC | PRN

## 2018-11-11 MED ORDER — IOPAMIDOL (ISOVUE-300) INJECTION 61%
100.0000 mL | Freq: Once | INTRAVENOUS | Status: AC | PRN
  Administered 2018-11-11: 100 mL via INTRAVENOUS

## 2018-11-11 MED ORDER — SODIUM CHLORIDE 0.45 % IV SOLN
INTRAVENOUS | Status: DC
  Administered 2018-11-11 – 2018-11-15 (×7): via INTRAVENOUS

## 2018-11-11 MED ORDER — ALBUTEROL SULFATE (2.5 MG/3ML) 0.083% IN NEBU: 2.5000 mg | INHALATION_SOLUTION | RESPIRATORY_TRACT | Status: DC | PRN

## 2018-11-11 MED ORDER — KETOROLAC TROMETHAMINE 15 MG/ML IJ SOLN
15.0000 mg | INTRAMUSCULAR | Status: AC
  Administered 2018-11-11: 15 mg via INTRAVENOUS
  Filled 2018-11-11: qty 1

## 2018-11-11 NOTE — ED Notes (Signed)
Patient transported to CT 
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
## 2018-11-11 NOTE — ED Triage Notes (Signed)
Per [facility] pt from [facility] jail for sickle cell in right shoulder since yesterday. 116/66, 96% on RA. 14R. HG99
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
## 2018-11-11 NOTE — ED Notes (Signed)
ED TO INPATIENT HANDOFF REPORT  Name/Age/Gender Jeffrey Morrison 23 y.o. male  Code Status    Code Status Orders  (From admission, onward)         Start     Ordered   11/11/18 2338  Full code  Continuous     11/11/18 2339        Code Status History    Date Active Date Inactive Code Status Order ID Comments User Context   07/10/2018 1928 07/17/2018 0043 Full Code [id number]  Phillips Grout, MD ED   04/21/2018 2237 04/26/2018 1741 Full Code [id number]  Rise Patience, MD ED   08/24/2017 1804 08/27/2017 1630 Full Code [id number]  [name], MD Inpatient   05/31/2017 1224 06/06/2017 1434 Full Code [id number]  Elwyn Reach, MD Inpatient   07/05/2015 2342 07/08/2015 1958 Full Code [id number]  Allyne Gee, MD Inpatient   11/12/2014 1614 11/16/2014 1900 Full Code [id number]  Karen Kitchens Inpatient   09/10/2014 2157 09/12/2014 2313 Full Code [id number]  Theressa Millard, MD Inpatient   08/08/2014 0334 08/14/2014 1910 Full Code [id number]  Shanda Howells, MD ED   08/08/2014 0329 08/08/2014 0334 Full Code [id number]  Shanda Howells, MD ED   01/08/2014 0746 01/12/2014 1759 Full Code [id number]  Berle Mull, MD Inpatient      Home/SNF/Other Jail  Chief Complaint Sickle Cell Pain Crisis; Shoulder Pain  Level of Care/Admitting Diagnosis ED Disposition    ED Disposition Condition Comment   Admit  Hospital Area: [place] [[id number]]  Level of Care: Telemetry [5]  Admit to tele based on following criteria: Monitor for Ischemic changes  Diagnosis: Sickle cell pain crisis [facility]) [[id number]]  Admitting Physician: Rhetta Mura [[id number]]  Attending Physician: Rhetta Mura [[id number]]  PT Class (Do Not Modify): Observation [104]  PT Acc Code (Do Not Modify): Observation [10022]       Medical History Past Medical History:  Diagnosis Date  . Chronic lower back pain   . Depression   . Sickle cell crisis ([place])   . Sickle cell disease  ([place])     Allergies No Known Allergies  IV Location/Drains/Wounds Patient Lines/Drains/Airways Status   Active Line/Drains/Airways    Name:   Placement date:   Placement time:   Site:   Days:   Peripheral IV 11/11/18 Left Hand   11/11/18    1903    Hand   less than 1   Wound / Incision (Open or Dehisced) 07/10/18 Puncture Foot Left Small puncture wound / cut / calus   07/10/18    2253    Foot   124          Labs/Imaging Results for orders placed or performed during the hospital encounter of 11/11/18 (from the past 48 hour(s))  Comprehensive metabolic panel     Status: Abnormal   Collection Time: 11/11/18  5:12 PM  Result Value Ref Range   Sodium 141 135 - 145 mmol/L   Potassium 4.4 3.5 - 5.1 mmol/L   Chloride 108 98 - 111 mmol/L   CO2 27 22 - 32 mmol/L   Glucose, Bld 102 (H) 70 - 99 mg/dL   BUN 8 6 - 20 mg/dL   Creatinine, Ser 0.66 0.61 - 1.24 mg/dL   Calcium 8.7 (L) 8.9 - 10.3 mg/dL   Total Protein 7.3 6.5 - 8.1 g/dL   Albumin 4.2 3.5 - 5.0 g/dL   AST 178 (H) 15 - 41 U/L  ALT 279 (H) 0 - 44 U/L   Alkaline Phosphatase 59 38 - 126 U/L   Total Bilirubin 6.4 (H) 0.3 - 1.2 mg/dL   GFR calc non Af Amer >60 >60 mL/min   GFR calc Af Amer >60 >60 mL/min   Anion gap 6 5 - 15    Comment: Performed at [facility], [place] [address] [place], [place] [address]  CBC with Differential     Status: Abnormal   Collection Time: 11/11/18  5:12 PM  Result Value Ref Range   WBC 12.4 (H) 4.0 - 10.5 K/uL    Comment: WHITE COUNT CONFIRMED ON SMEAR ADJUSTED FOR NUCLEATED RED BLOOD CELLS    RBC 1.83 (L) 4.22 - 5.81 MIL/uL   Hemoglobin 7.3 (L) 13.0 - 17.0 g/dL   HCT 20.3 (L) 39.0 - 52.0 %   MCV 110.9 (H) 80.0 - 100.0 fL   MCH 39.9 (H) 26.0 - 34.0 pg   MCHC 36.0 30.0 - 36.0 g/dL   RDW 23.0 (H) 11.5 - 15.5 %   Platelets 316 150 - 400 K/uL   nRBC 22.0 (H) 0.0 - 0.2 %   Neutrophils Relative % 44 %   Neutro Abs 5.6 1.7 - 7.7 K/uL   Lymphocytes Relative 36 %   Lymphs Abs  4.4 (H) 0.7 - 4.0 K/uL   Monocytes Relative 13 %   Monocytes Absolute 1.6 (H) 0.1 - 1.0 K/uL   Eosinophils Relative 5 %   Eosinophils Absolute 0.7 (H) 0.0 - 0.5 K/uL   Basophils Relative 1 %   Basophils Absolute 0.1 0.0 - 0.1 K/uL   Immature Granulocytes 1 %   Abs Immature Granulocytes 0.14 (H) 0.00 - 0.07 K/uL   Tammy Sours Bodies PRESENT    Polychromasia PRESENT    Sickle Cells PRESENT    Target Cells PRESENT     Comment: Performed at [facility], [place] [address], [place], [place] [address]  Reticulocytes     Status: Abnormal   Collection Time: 11/11/18  5:12 PM  Result Value Ref Range   Retic Ct Pct 13.5 (H) 0.4 - 3.1 %   RBC. 1.83 (L) 4.22 - 5.81 MIL/uL   Retic Count, Absolute 256.5 (H) 19.0 - 186.0 K/uL   Immature Retic Fract 36.4 (H) 2.3 - 15.9 %    Comment: Performed at [facility], [place] [address], [place], [place] [address]  Acetaminophen level     Status: Abnormal   Collection Time: 11/11/18  7:09 PM  Result Value Ref Range   Acetaminophen (Tylenol), Serum <10 (L) 10 - 30 ug/mL    Comment: (NOTE) Therapeutic concentrations vary significantly. A range of 10-30 ug/mL  may be an effective concentration for many patients. However, some  are best treated at concentrations outside of this range. Acetaminophen concentrations >150 ug/mL at 4 hours after ingestion  and >50 ug/mL at 12 hours after ingestion are often associated with  toxic reactions. Performed at [facility], [place] [address] [place],  [address]   POC occult blood, ED Provider will collect     Status: None   Collection Time: 11/11/18  9:05 PM  Result Value Ref Range   Fecal Occult Bld NEGATIVE NEGATIVE  Lipase, blood     Status: None   Collection Time: 11/11/18 10:00 PM  Result Value Ref Range   Lipase 36 11 - 51 U/L    Comment: Performed at [facility], [place] [address] [place],  [place] [address]   Ct Abdomen Pelvis W  Contrast  Result Date: 11/11/2018 CLINICAL DATA:  23 year old male with abnormal LFTs. Periumbilical pain. EXAM: CT ABDOMEN AND PELVIS WITH CONTRAST TECHNIQUE: Multidetector CT imaging of the abdomen and pelvis was performed using the standard protocol following bolus administration of intravenous contrast. CONTRAST:  <See Chart> ISOVUE-300 IOPAMIDOL (ISOVUE-300) INJECTION 61% COMPARISON:  Abdominal ultrasound dated 11/11/2018 FINDINGS: Lower chest: The visualized lung bases are clear. No intra-abdominal free air or free fluid. Hepatobiliary: Top-normal liver size. There is slight heterogeneity of the liver. Clinical correlation is recommended to exclude hepatitis. The gallbladder is unremarkable. Pancreas: Unremarkable. No pancreatic ductal dilatation or surrounding inflammatory changes. Spleen: Splenectomy. Adrenals/Urinary Tract: The adrenal glands are unremarkable. There is no hydronephrosis on either side. The urinary bladder is unremarkable. Stomach/Bowel: There is large amount of stool throughout the colon. No bowel obstruction or active inflammation. Normal appendix. Vascular/Lymphatic: The abdominal aorta and IVC appear unremarkable. No portal venous gas. There is no adenopathy. Reproductive: The prostate and seminal vesicles are grossly unremarkable. No pelvic mass. Other: None Musculoskeletal: Patchy areas of sclerosis throughout the spine and sacrum and pelvis and femur may represent areas of avascular necrosis or related to underlying sickle cell disease. No acute osseous pathology. IMPRESSION: 1. Top-normal and slightly heterogeneous liver. Clinical correlation is recommended to exclude hepatitis. 2. Large amount of stool throughout the colon. No bowel obstruction or active inflammation. Normal appendix. 3. Splenectomy. 4. Patchy sclerotic areas throughout the osseous structures, likely sequela of sickle cell disease. Electronically Signed   By: Anner Crete M.D.   On: 11/11/2018 22:27   US  Abdomen Limited Ruq  Result Date: 11/11/2018 CLINICAL DATA:  Elevated LFTs EXAM: ULTRASOUND ABDOMEN LIMITED RIGHT UPPER QUADRANT COMPARISON:  None. FINDINGS: Gallbladder: 8 mm mobile gallstone within the gallbladder. No wall thickening or sonographic Murphy sign. Common bile duct: Diameter: Normal caliber, 3 mm Liver: No focal lesion identified. Within normal limits in parenchymal echogenicity. Portal vein is patent on color Doppler imaging with normal direction of blood flow towards the liver. IMPRESSION: Cholelithiasis.  No sonographic evidence of acute cholecystitis. Electronically Signed   By: Rolm Baptise M.D.   On: 11/11/2018 19:08    Pending Labs Unresulted Labs (From admission, onward)    Start     Ordered   11/12/18 0500  Magnesium  Tomorrow morning,   R     11/11/18 2339   11/12/18 0500  Protime-INR  Tomorrow morning,   R     11/11/18 2339   11/12/18 0500  Comprehensive metabolic panel  Tomorrow morning,   R     11/11/18 2339   11/12/18 0500  CBC  Tomorrow morning,   R     11/11/18 2339   11/12/18 0500  Bilirubin, direct  Tomorrow morning,   R     11/11/18 2342   11/11/18 2340  Urine rapid drug screen (hosp performed)  Once,   R     11/11/18 2339   11/11/18 2233  Hepatitis panel, acute  ONCE - STAT,   STAT     11/11/18 2233          Vitals/Pain Today's Vitals   11/11/18 2000 11/11/18 2033 11/11/18 2130 11/11/18 2138  BP: 130/85  111/63   Pulse: 72  (!) 58   Resp: 12  16   Temp:      TempSrc:      SpO2: 99%  96%   PainSc:  6   5  Isolation Precautions No active isolations  Medications Medications  ondansetron (ZOFRAN) injection 4 mg (has no administration in time range)  0.45 % sodium chloride infusion ( Intravenous New Bag/Given 11/11/18 1907)  diphenhydrAMINE (BENADRYL) capsule 25-50 mg (has no administration in time range)  hydroxyurea (HYDREA) capsule 500 mg (has no administration in time range)  buPROPion (WELLBUTRIN XL) 24 hr tablet 150 mg (has no  administration in time range)  citalopram (CELEXA) tablet 40 mg (has no administration in time range)  gabapentin (NEURONTIN) capsule 300 mg (has no administration in time range)  ondansetron (ZOFRAN) tablet 4 mg (has no administration in time range)    Or  ondansetron (ZOFRAN) injection 4 mg (has no administration in time range)  albuterol (PROVENTIL) (2.5 MG/3ML) 0.083% nebulizer solution 2.5 mg (has no administration in time range)  naloxone (NARCAN) injection 0.4 mg (has no administration in time range)    And  sodium chloride flush (NS) 0.9 % injection 9 mL (has no administration in time range)  HYDROmorphone (DILAUDID) 1 mg/mL PCA injection (has no administration in time range)  nicotine (NICODERM CQ - dosed in mg/24 hours) patch 14 mg (has no administration in time range)  HYDROmorphone (DILAUDID) injection 0.5 mg (0.5 mg Intravenous Given 11/11/18 1905)    Or  HYDROmorphone (DILAUDID) injection 0.5 mg ( Subcutaneous See Alternative 11/11/18 1905)  HYDROmorphone (DILAUDID) injection 1 mg (1 mg Intravenous Given 11/11/18 1944)    Or  HYDROmorphone (DILAUDID) injection 1 mg ( Subcutaneous See Alternative 11/11/18 1944)  HYDROmorphone (DILAUDID) injection 1 mg (1 mg Intravenous Given 11/11/18 2040)    Or  HYDROmorphone (DILAUDID) injection 1 mg ( Subcutaneous See Alternative 11/11/18 2040)  ketorolac (TORADOL) 15 MG/ML injection 15 mg (15 mg Intravenous Given 11/11/18 1904)  iopamidol (ISOVUE-300) 61 % injection 100 mL (100 mLs Intravenous Contrast Given 11/11/18 2155)    Mobility walks

## 2018-11-12 ENCOUNTER — Inpatient Hospital Stay (HOSPITAL_COMMUNITY): Payer: Medicare Other

## 2018-11-12 DIAGNOSIS — F329 Major depressive disorder, single episode, unspecified: Secondary | ICD-10-CM

## 2018-11-12 DIAGNOSIS — F17210 Nicotine dependence, cigarettes, uncomplicated: Secondary | ICD-10-CM | POA: Diagnosis not present

## 2018-11-12 DIAGNOSIS — D5700 Hb-SS disease with crisis, unspecified: Secondary | ICD-10-CM | POA: Diagnosis not present

## 2018-11-12 DIAGNOSIS — R7989 Other specified abnormal findings of blood chemistry: Secondary | ICD-10-CM

## 2018-11-12 DIAGNOSIS — Z833 Family history of diabetes mellitus: Secondary | ICD-10-CM | POA: Diagnosis not present

## 2018-11-12 DIAGNOSIS — Z79899 Other long term (current) drug therapy: Secondary | ICD-10-CM | POA: Diagnosis not present

## 2018-11-12 DIAGNOSIS — Z9081 Acquired absence of spleen: Secondary | ICD-10-CM | POA: Diagnosis not present

## 2018-11-12 DIAGNOSIS — D638 Anemia in other chronic diseases classified elsewhere: Secondary | ICD-10-CM | POA: Diagnosis not present

## 2018-11-12 DIAGNOSIS — R740 Nonspecific elevation of levels of transaminase and lactic acid dehydrogenase [LDH]: Secondary | ICD-10-CM

## 2018-11-12 DIAGNOSIS — K5900 Constipation, unspecified: Secondary | ICD-10-CM | POA: Diagnosis present

## 2018-11-12 DIAGNOSIS — J9691 Respiratory failure, unspecified with hypoxia: Secondary | ICD-10-CM | POA: Diagnosis not present

## 2018-11-12 DIAGNOSIS — R45851 Suicidal ideations: Secondary | ICD-10-CM | POA: Diagnosis not present

## 2018-11-12 DIAGNOSIS — R17 Unspecified jaundice: Secondary | ICD-10-CM

## 2018-11-12 DIAGNOSIS — G4733 Obstructive sleep apnea (adult) (pediatric): Secondary | ICD-10-CM | POA: Diagnosis not present

## 2018-11-12 DIAGNOSIS — R7981 Abnormal blood-gas level: Secondary | ICD-10-CM | POA: Diagnosis not present

## 2018-11-12 DIAGNOSIS — R451 Restlessness and agitation: Secondary | ICD-10-CM

## 2018-11-12 DIAGNOSIS — F330 Major depressive disorder, recurrent, mild: Secondary | ICD-10-CM | POA: Diagnosis not present

## 2018-11-12 DIAGNOSIS — G894 Chronic pain syndrome: Secondary | ICD-10-CM | POA: Diagnosis not present

## 2018-11-12 DIAGNOSIS — Z720 Tobacco use: Secondary | ICD-10-CM | POA: Diagnosis not present

## 2018-11-12 DIAGNOSIS — K8020 Calculus of gallbladder without cholecystitis without obstruction: Secondary | ICD-10-CM | POA: Diagnosis present

## 2018-11-12 DIAGNOSIS — M545 Low back pain: Secondary | ICD-10-CM | POA: Diagnosis not present

## 2018-11-12 DIAGNOSIS — R5081 Fever presenting with conditions classified elsewhere: Secondary | ICD-10-CM | POA: Diagnosis not present

## 2018-11-12 DIAGNOSIS — Z7151 Drug abuse counseling and surveillance of drug abuser: Secondary | ICD-10-CM | POA: Diagnosis not present

## 2018-11-12 DIAGNOSIS — F1190 Opioid use, unspecified, uncomplicated: Secondary | ICD-10-CM | POA: Diagnosis present

## 2018-11-12 DIAGNOSIS — D72829 Elevated white blood cell count, unspecified: Secondary | ICD-10-CM | POA: Diagnosis present

## 2018-11-12 DIAGNOSIS — R339 Retention of urine, unspecified: Secondary | ICD-10-CM | POA: Diagnosis present

## 2018-11-12 DIAGNOSIS — F1590 Other stimulant use, unspecified, uncomplicated: Secondary | ICD-10-CM | POA: Diagnosis present

## 2018-11-12 DIAGNOSIS — F321 Major depressive disorder, single episode, moderate: Secondary | ICD-10-CM | POA: Diagnosis not present

## 2018-11-12 DIAGNOSIS — D5701 Hb-SS disease with acute chest syndrome: Secondary | ICD-10-CM | POA: Diagnosis not present

## 2018-11-12 DIAGNOSIS — R7401 Elevation of levels of liver transaminase levels: Secondary | ICD-10-CM | POA: Diagnosis present

## 2018-11-12 DIAGNOSIS — R945 Abnormal results of liver function studies: Secondary | ICD-10-CM | POA: Diagnosis not present

## 2018-11-12 DIAGNOSIS — R6510 Systemic inflammatory response syndrome (SIRS) of non-infectious origin without acute organ dysfunction: Secondary | ICD-10-CM | POA: Diagnosis not present

## 2018-11-12 LAB — PROTIME-INR
INR: 1 (ref 0.8–1.2)
Prothrombin Time: 13.1 seconds (ref 11.4–15.2)

## 2018-11-12 LAB — URINALYSIS, COMPLETE (UACMP) WITH MICROSCOPIC
Bilirubin Urine: NEGATIVE
Glucose, UA: NEGATIVE mg/dL
Hgb urine dipstick: NEGATIVE
Ketones, ur: NEGATIVE mg/dL
Leukocytes,Ua: NEGATIVE
NITRITE: NEGATIVE
PH: 6 (ref 5.0–8.0)
Protein, ur: 30 mg/dL — AB
Specific Gravity, Urine: 1.019 (ref 1.005–1.030)

## 2018-11-12 LAB — COMPREHENSIVE METABOLIC PANEL
ALBUMIN: 4.2 g/dL (ref 3.5–5.0)
ALT: 275 U/L — ABNORMAL HIGH (ref 0–44)
AST: 175 U/L — ABNORMAL HIGH (ref 15–41)
Alkaline Phosphatase: 66 U/L (ref 38–126)
Anion gap: 3 — ABNORMAL LOW (ref 5–15)
BILIRUBIN TOTAL: 7.1 mg/dL — AB (ref 0.3–1.2)
BUN: 9 mg/dL (ref 6–20)
CO2: 31 mmol/L (ref 22–32)
Calcium: 9 mg/dL (ref 8.9–10.3)
Chloride: 107 mmol/L (ref 98–111)
Creatinine, Ser: 0.8 mg/dL (ref 0.61–1.24)
GFR calc Af Amer: >60 mL/min (ref >60)
GFR calc non Af Amer: >60 mL/min (ref >60)
Glucose, Bld: 97 mg/dL (ref 70–99)
Potassium: 4 mmol/L (ref 3.5–5.1)
Sodium: 141 mmol/L (ref 135–145)
Total Protein: 7.4 g/dL (ref 6.5–8.1)

## 2018-11-12 LAB — CBC
HCT: 18 % — ABNORMAL LOW (ref 39.0–52.0)
Hemoglobin: 6.2 g/dL — CL (ref 13.0–17.0)
MCH: 38.8 pg — AB (ref 26.0–34.0)
MCHC: 34.4 g/dL (ref 30.0–36.0)
MCV: 112.5 fL — ABNORMAL HIGH (ref 80.0–100.0)
Platelets: 249 10*3/uL (ref 150–400)
RBC: 1.6 MIL/uL — ABNORMAL LOW (ref 4.22–5.81)
RDW: 23 % — ABNORMAL HIGH (ref 11.5–15.5)
WBC: 12.4 10*3/uL — ABNORMAL HIGH (ref 4.0–10.5)
nRBC: 30.1 % — ABNORMAL HIGH (ref 0.0–0.2)

## 2018-11-12 LAB — BILIRUBIN, DIRECT: Bilirubin, Direct: 1.5 mg/dL — ABNORMAL HIGH (ref 0.0–0.2)

## 2018-11-12 LAB — MAGNESIUM: Magnesium: 2.4 mg/dL (ref 1.7–2.4)

## 2018-11-12 LAB — MRSA PCR SCREENING: MRSA by PCR: NEGATIVE

## 2018-11-12 LAB — RAPID URINE DRUG SCREEN, HOSP PERFORMED
Amphetamines: NOT DETECTED
Barbiturates: NOT DETECTED
Benzodiazepines: NOT DETECTED
Cocaine: NOT DETECTED
Opiates: POSITIVE — AB
TETRAHYDROCANNABINOL: NOT DETECTED

## 2018-11-12 LAB — PREPARE RBC (CROSSMATCH)

## 2018-11-12 MED ORDER — SODIUM CHLORIDE 0.9% FLUSH: 10.0000 mL | INTRAVENOUS | Status: DC | PRN

## 2018-11-12 MED ORDER — HYDROMORPHONE HCL 1 MG/ML IJ SOLN
1.0000 mg | Freq: Once | INTRAMUSCULAR | Status: AC
  Administered 2018-11-12: 1 mg via INTRAVENOUS

## 2018-11-12 MED ORDER — KETOROLAC TROMETHAMINE 15 MG/ML IJ SOLN
15.0000 mg | Freq: Once | INTRAMUSCULAR | Status: AC
  Administered 2018-11-12: 15 mg via INTRAVENOUS
  Filled 2018-11-12: qty 1

## 2018-11-12 MED ORDER — HALOPERIDOL LACTATE 5 MG/ML IJ SOLN
2.0000 mg | Freq: Four times a day (QID) | INTRAMUSCULAR | Status: DC | PRN
  Administered 2018-11-12: 2 mg via INTRAVENOUS
  Filled 2018-11-12: qty 1

## 2018-11-12 MED ORDER — HALOPERIDOL LACTATE 5 MG/ML IJ SOLN
INTRAMUSCULAR | Status: AC
  Filled 2018-11-12: qty 1

## 2018-11-12 MED ORDER — SODIUM CHLORIDE 0.9% IV SOLUTION: Freq: Once | INTRAVENOUS | Status: DC

## 2018-11-12 MED ORDER — DIPHENHYDRAMINE HCL 25 MG PO CAPS
25.0000 mg | ORAL_CAPSULE | Freq: Once | ORAL | Status: AC
  Administered 2018-11-12: 25 mg via ORAL
  Filled 2018-11-12: qty 1

## 2018-11-12 MED ORDER — HYDROMORPHONE HCL 1 MG/ML IJ SOLN
2.0000 mg | INTRAMUSCULAR | Status: DC | PRN
  Administered 2018-11-12 – 2018-11-13 (×7): 2 mg via INTRAVENOUS
  Filled 2018-11-12 (×7): qty 2

## 2018-11-12 MED ORDER — SODIUM CHLORIDE 0.9 % IV SOLN
1.0000 g | Freq: Three times a day (TID) | INTRAVENOUS | Status: DC
  Administered 2018-11-12 – 2018-11-15 (×8): 1 g via INTRAVENOUS
  Filled 2018-11-12 (×9): qty 1

## 2018-11-12 MED ORDER — ACETAMINOPHEN 325 MG PO TABS
650.0000 mg | ORAL_TABLET | Freq: Four times a day (QID) | ORAL | Status: DC | PRN
  Administered 2018-11-12 – 2018-11-15 (×6): 650 mg via ORAL
  Filled 2018-11-12 (×6): qty 2

## 2018-11-12 MED ORDER — LIP MEDEX EX OINT
TOPICAL_OINTMENT | CUTANEOUS | Status: DC | PRN
  Administered 2018-11-14: 08:00:00 via TOPICAL
  Filled 2018-11-12 (×2): qty 7

## 2018-11-12 MED ORDER — KETOROLAC TROMETHAMINE 15 MG/ML IJ SOLN
15.0000 mg | Freq: Four times a day (QID) | INTRAMUSCULAR | Status: AC
  Administered 2018-11-12 – 2018-11-13 (×5): 15 mg via INTRAVENOUS
  Filled 2018-11-12 (×5): qty 1

## 2018-11-12 NOTE — Progress Notes (Signed)
EKG complete. Tech unable to transfer to epic. EKG in patient's chart. Will continue to monitor.

## 2018-11-12 NOTE — Progress Notes (Signed)
LCSW consulted for homeless/jail.  Patient is in custody of the [facility]. Officer present in room.  Patient will dc back to jail.   LCSW signing off. No CSW needs.  Carolin Coy [place]

## 2018-11-12 NOTE — Progress Notes (Signed)
CRITICAL VALUE STICKER  CRITICAL VALUE: hgb 6.2  RECEIVER (on-site recipient of call): Vera   DATE & TIME NOTIFIED: 11/12/18   0630  MESSENGER (representative from lab):  MD NOTIFIED: [place]:  [id number]  RESPONSE:  Awaiting

## 2018-11-12 NOTE — Progress Notes (Signed)
Notified by Jarrett Soho RN in regards to patient in extreme pain. Upon arrival patient was in pain, 10/10. FNP Lachina with sickle cell team was at bedside. FNP Lachina ordered for patient to receive 2mg  of haldol and 2mg  of diluadid IV. Patient's PCA pump would be discontinued. FNP Lachina ordered to continue to monitor patient on 5east at this time. Patient was alert and oriented x 3, disoriented to the month but able to tell year correctly. Patient able to follow commands. See vital signs in chart. If patient begins to have a change in clinical status or pain becomes unmanageable please call Rapid Response RN.

## 2018-11-12 NOTE — Progress Notes (Signed)
Pharmacy Antibiotic Note  Jeffrey Morrison is a 23 y.o. male admitted on 11/11/2018 with hx of  sickle cell anemia, depression who is admitted to [facility] with sickle cell pain crisis.  Pharmacy has been consulted for cefepime dosing for SIRS.  Plan: Cefepime 1gm IV q8h Follow renal function, cultures and clinical course  Weight: 185 lb 10 oz (84.2 kg)  Temp (24hrs), Avg:99.4 F (37.4 C), Min:97.7 F (36.5 C), Max:101 F (38.3 C)  Recent Labs  Lab 11/11/18 1712 11/12/18 0533  WBC 12.4* 12.4*  CREATININE 0.66 0.80    Estimated Creatinine Clearance: 150.4 mL/min (by C-G formula based on SCr of 0.8 mg/dL).    No Known Allergies  Antimicrobials this admission: 2/2/ cefepime Dose adjustments this admission:   Microbiology results:   2/28 MRSA PCR: neg  Thank you for allowing pharmacy to be a part of this patient's care.  Dolly Rias RPh 11/12/2018, 9:54 PM Pager [phone]

## 2018-11-12 NOTE — Progress Notes (Signed)
Pt crying rates pain 10/10. PCA on and encouraged. Pt uncooperative pulling off tele monitor and not using kpad. PCA button on floor. Pt yelling "I want to die". Encouraged pt to deep breath and keep button close to him, MD notified. Awaiting orders. Will continue to monitor.
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
## 2018-11-12 NOTE — Progress Notes (Signed)
New Admission Note:    Arrival Method: Bed. Engineer, structural at bedside.  Mental Orientation: A X O X 4 Telemetry: Box 57, Sinus Brady  Assessment: complete Skin: dry, old scars on legs Iv: L Hand Pain: 8/10. Knees, hips & lower back Tubes: n/a Safety Measures: Safety Fall Prevention Plan has been given, discussed and signed Admission: Completed [facility] Orientation: Patient has been orientated to the room, unit, and staff.   Orders have been reviewed and implemented. Will continue to monitor the patient. Call light has been placed within reach.  Tawni Carnes, RN Phone number: [phone]
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
## 2018-11-12 NOTE — Progress Notes (Signed)
Patient noted to be in extreme pain(rating 10/10), distress.  Patient was febrile, tachy, and tachypnic.  Sickle cell practitioner notified. Rapid response nurse notified. Orders placed and followed.  See new orders.  Will continue to monitor.   Virginia Rochester, RN

## 2018-11-13 DIAGNOSIS — F330 Major depressive disorder, recurrent, mild: Secondary | ICD-10-CM

## 2018-11-13 LAB — HEPATITIS PANEL, ACUTE
HCV Ab: 0.8 s/co ratio (ref 0.0–0.9)
HEP B S AG: NEGATIVE
Hep A IgM: NEGATIVE
Hep B C IgM: NEGATIVE

## 2018-11-13 LAB — HEMOGLOBIN AND HEMATOCRIT, BLOOD
HEMATOCRIT: 14.1 % — AB (ref 39.0–52.0)
Hemoglobin: 4.7 g/dL — CL (ref 13.0–17.0)

## 2018-11-13 LAB — URINE CULTURE: Culture: NO GROWTH

## 2018-11-13 LAB — GLUCOSE, CAPILLARY: Glucose-Capillary: 113 mg/dL — ABNORMAL HIGH (ref 70–99)

## 2018-11-13 LAB — PREPARE RBC (CROSSMATCH)

## 2018-11-13 MED ORDER — HYDROMORPHONE HCL 1 MG/ML IJ SOLN
1.0000 mg | INTRAMUSCULAR | Status: DC | PRN
  Administered 2018-11-13 – 2018-11-14 (×5): 1 mg via INTRAVENOUS
  Administered 2018-11-14: 2 mg via INTRAVENOUS
  Administered 2018-11-14 (×4): 1 mg via INTRAVENOUS
  Administered 2018-11-15 – 2018-11-16 (×10): 2 mg via INTRAVENOUS
  Filled 2018-11-13: qty 1
  Filled 2018-11-13: qty 2
  Filled 2018-11-13 (×3): qty 1
  Filled 2018-11-13: qty 2
  Filled 2018-11-13 (×4): qty 1
  Filled 2018-11-13: qty 2
  Filled 2018-11-13 (×2): qty 1
  Filled 2018-11-13 (×7): qty 2
  Filled 2018-11-13: qty 1
  Filled 2018-11-13: qty 2

## 2018-11-13 MED ORDER — SODIUM CHLORIDE 0.9% IV SOLUTION: Freq: Once | INTRAVENOUS | Status: DC

## 2018-11-13 NOTE — Progress Notes (Signed)
Pt remains in bed, alert. O2 continues at 3L/min via [place] with O2 sats 96%. Pt has tolerated lunch well (pizza, brownies, milk). Still awaiting one unit PRBC from lab, who reported that the blood is coming from [place], [place], and should be here around 6pm tonight. HR 101

## 2018-11-13 NOTE — Progress Notes (Signed)
PHARMACY BRIEF NOTE: HYDROXYUREA   By [facility] policy, hydroxyurea is automatically held when any of the following laboratory values occur:  ANC < 2 K  Pltc < 80K in sickle-cell patients; < 100K in other patients  Hgb <= 6 in sickle-cell patients; < 8 in other patients  Reticulocytes < 80K when Hgb < 9  Hydroxyurea has been held (discontinued from profile) per policy. Hgb 4.7 gm/dl today   Minda Ditto PharmD Pager [phone] 11/13/2018, 11:18 AM

## 2018-11-13 NOTE — Consult Note (Signed)
[facility] Consult   Reason for Consult:  "suicidal ideations, agitation" Referring Physician:  Cammie Sickle, FNP Patient Identification: Jeffrey Morrison MRN:  [id number] Principal Diagnosis: Depression Diagnosis:  Principal Problem:   Depression Active Problems:   Sickle cell pain crisis ([place])   Transaminitis   Tobacco abuse   LFT elevation   Total bilirubin, elevated   Low oxygen saturation   Agitation   Total Time spent with patient: 45 minutes  Subjective:   Jeffrey Morrison is a 24 y.o. male who was admitted to the hospital due to sickle cell anemia pain crisis.   HPI:    Jeffrey Morrison is a 23 y.o. male with a medical history significant for sickle cell anemia and depression who was transported from [facility] jail and admitted to [facility] due to Sickle Cell Anemia pain crisis on 11/11/2018.  He reports taking Wellbutrin XL 150 mg and citalopram 40 mg daily for depression.He states that he was feeling suicidal because the current pain medication is not addressing his pain. However, patient reports that if his pain is taken care of, he will not be suicidal. Today, the patient  continues to verbalize right flank pain, but he denies depression, delusion, psychosis, suicidal/homicidal ideation, intent, or plan.  Presently incarcerated due to a history of breaking and entering and  has a Environmental manager at the bedside.   Past Psychiatric History:  History of depression. Currently taking Wellbutrin XL and citalopram.   Risk to Self:  Denies Risk to Others:  Denies Prior Inpatient Therapy:  Denies Prior Outpatient Therapy:  yes  Past Medical History:  Past Medical History:  Diagnosis Date  . Chronic lower back pain   . Depression   . Sickle cell crisis ([place])   . Sickle cell disease ([place])     Past Surgical History:  Procedure Laterality Date  . SPLENECTOMY     Family History:  Family History  Problem Relation Age of Onset  . Diabetes  Mother    Family Psychiatric  History: unknown Social History:  Currently incarcerated due to breaking and entering.  Social History   Substance and Sexual Activity  Alcohol Use No     Social History   Substance and Sexual Activity  Drug Use Yes  . Types: IV, Methamphetamines   Comment: heroin     Social History   Socioeconomic History  . Marital status: Single    Spouse name: Not on file  . Number of children: Not on file  . Years of education: Not on file  . Highest education level: Not on file  Occupational History  . Not on file  Social Needs  . Financial resource strain: Not on file  . Food insecurity:    Worry: Not on file    Inability: Not on file  . Transportation needs:    Medical: Not on file    Non-medical: Not on file  Tobacco Use  . Smoking status: Current Every Day Smoker    Packs/day: 0.50    Types: Cigarettes  . Smokeless tobacco: Never Used  Substance and Sexual Activity  . Alcohol use: No  . Drug use: Yes    Types: IV, Methamphetamines    Comment: heroin   . Sexual activity: Not on file  Lifestyle  . Physical activity:    Days per week: Not on file    Minutes per session: Not on file  . Stress: Not on file  Relationships  . Social connections:  Talks on phone: Not on file    Gets together: Not on file    Attends religious service: Not on file    Active member of club or organization: Not on file    Attends meetings of clubs or organizations: Not on file    Relationship status: Not on file  Other Topics Concern  . Not on file  Social History Narrative  . Not on file   Additional Social History:    Allergies:  No Known Allergies  Labs:  Results for orders placed or performed during the hospital encounter of 11/11/18 (from the past 48 hour(s))  Comprehensive metabolic panel     Status: Abnormal   Collection Time: 11/11/18  5:12 PM  Result Value Ref Range   Sodium 141 135 - 145 mmol/L   Potassium 4.4 3.5 - 5.1 mmol/L    Chloride 108 98 - 111 mmol/L   CO2 27 22 - 32 mmol/L   Glucose, Bld 102 (H) 70 - 99 mg/dL   BUN 8 6 - 20 mg/dL   Creatinine, Ser 0.66 0.61 - 1.24 mg/dL   Calcium 8.7 (L) 8.9 - 10.3 mg/dL   Total Protein 7.3 6.5 - 8.1 g/dL   Albumin 4.2 3.5 - 5.0 g/dL   AST 178 (H) 15 - 41 U/L   ALT 279 (H) 0 - 44 U/L   Alkaline Phosphatase 59 38 - 126 U/L   Total Bilirubin 6.4 (H) 0.3 - 1.2 mg/dL   GFR calc non Af Amer >60 >60 mL/min   GFR calc Af Amer >60 >60 mL/min   Anion gap 6 5 - 15    Comment: Performed at [facility], [place] [address], [place], [place] [address]  CBC with Differential     Status: Abnormal   Collection Time: 11/11/18  5:12 PM  Result Value Ref Range   WBC 12.4 (H) 4.0 - 10.5 K/uL    Comment: WHITE COUNT CONFIRMED ON SMEAR ADJUSTED FOR NUCLEATED RED BLOOD CELLS    RBC 1.83 (L) 4.22 - 5.81 MIL/uL   Hemoglobin 7.3 (L) 13.0 - 17.0 g/dL   HCT 20.3 (L) 39.0 - 52.0 %   MCV 110.9 (H) 80.0 - 100.0 fL   MCH 39.9 (H) 26.0 - 34.0 pg   MCHC 36.0 30.0 - 36.0 g/dL   RDW 23.0 (H) 11.5 - 15.5 %   Platelets 316 150 - 400 K/uL   nRBC 22.0 (H) 0.0 - 0.2 %   Neutrophils Relative % 44 %   Neutro Abs 5.6 1.7 - 7.7 K/uL   Lymphocytes Relative 36 %   Lymphs Abs 4.4 (H) 0.7 - 4.0 K/uL   Monocytes Relative 13 %   Monocytes Absolute 1.6 (H) 0.1 - 1.0 K/uL   Eosinophils Relative 5 %   Eosinophils Absolute 0.7 (H) 0.0 - 0.5 K/uL   Basophils Relative 1 %   Basophils Absolute 0.1 0.0 - 0.1 K/uL   Immature Granulocytes 1 %   Abs Immature Granulocytes 0.14 (H) 0.00 - 0.07 K/uL   Tammy Sours Bodies PRESENT    Polychromasia PRESENT    Sickle Cells PRESENT    Target Cells PRESENT     Comment: Performed at [facility], [place] [address], [place], [place] [address]  Reticulocytes     Status: Abnormal   Collection Time: 11/11/18  5:12 PM  Result Value Ref Range   Retic Ct Pct 13.5 (H) 0.4 - 3.1 %   RBC. 1.83 (L) 4.22 - 5.81  MIL/uL   Retic Count, Absolute 256.5  (H) 19.0 - 186.0 K/uL   Immature Retic Fract 36.4 (H) 2.3 - 15.9 %    Comment: Performed at [facility], [place] [address] [place], [place] [address]  Acetaminophen level     Status: Abnormal   Collection Time: 11/11/18  7:09 PM  Result Value Ref Range   Acetaminophen (Tylenol), Serum <10 (L) 10 - 30 ug/mL    Comment: (NOTE) Therapeutic concentrations vary significantly. A range of 10-30 ug/mL  may be an effective concentration for many patients. However, some  are best treated at concentrations outside of this range. Acetaminophen concentrations >150 ug/mL at 4 hours after ingestion  and >50 ug/mL at 12 hours after ingestion are often associated with  toxic reactions. Performed at [facility], [place] [address] [place], [place] [address]   POC occult blood, ED Provider will collect     Status: None   Collection Time: 11/11/18  9:05 PM  Result Value Ref Range   Fecal Occult Bld NEGATIVE NEGATIVE  Lipase, blood     Status: None   Collection Time: 11/11/18 10:00 PM  Result Value Ref Range   Lipase 36 11 - 51 U/L    Comment: Performed at [facility], [place] [address] [place], [place] [address]  Hepatitis panel, acute     Status: None   Collection Time: 11/11/18 10:50 PM  Result Value Ref Range   Hepatitis B Surface Ag Negative Negative   HCV Ab 0.8 0.0 - 0.9 s/co ratio    Comment: (NOTE)                                  Negative:     < 0.8                             Indeterminate: 0.8 - 0.9                                  Positive:     > 0.9 The CDC recommends that a positive HCV antibody result be followed up with a HCV Nucleic Acid Amplification test (528413). Performed At: [facility] [place], [place] [id number] Rush Farmer MD ZD:[id number]    Hep A IgM Negative Negative   Hep B C IgM Negative Negative  MRSA PCR Screening     Status: None   Collection Time: 11/12/18 12:45 AM  Result Value  Ref Range   MRSA by PCR NEGATIVE NEGATIVE    Comment:        The GeneXpert MRSA Assay (FDA approved for NASAL specimens only), is one component of a comprehensive MRSA colonization surveillance program. It is not intended to diagnose MRSA infection nor to guide or monitor treatment for MRSA infections. Performed at [facility], [place] [address] [place], [place] [address]   Magnesium     Status: None   Collection Time: 11/12/18  5:33 AM  Result Value Ref Range   Magnesium 2.4 1.7 - 2.4 mg/dL    Comment: Performed at [facility], [place] [address] [place], [place] [address]  Protime-INR     Status: None   Collection Time: 11/12/18  5:33 AM  Result Value Ref Range   Prothrombin Time 13.1  11.4 - 15.2 seconds   INR 1.0 0.8 - 1.2    Comment: (NOTE) INR goal varies based on device and disease states. Performed at [facility], [place] [address], [place], [place] [address]   Comprehensive metabolic panel     Status: Abnormal   Collection Time: 11/12/18  5:33 AM  Result Value Ref Range   Sodium 141 135 - 145 mmol/L   Potassium 4.0 3.5 - 5.1 mmol/L   Chloride 107 98 - 111 mmol/L   CO2 31 22 - 32 mmol/L   Glucose, Bld 97 70 - 99 mg/dL   BUN 9 6 - 20 mg/dL   Creatinine, Ser 0.80 0.61 - 1.24 mg/dL   Calcium 9.0 8.9 - 10.3 mg/dL   Total Protein 7.4 6.5 - 8.1 g/dL   Albumin 4.2 3.5 - 5.0 g/dL   AST 175 (H) 15 - 41 U/L   ALT 275 (H) 0 - 44 U/L   Alkaline Phosphatase 66 38 - 126 U/L   Total Bilirubin 7.1 (H) 0.3 - 1.2 mg/dL   GFR calc non Af Amer >60 >60 mL/min   GFR calc Af Amer >60 >60 mL/min   Anion gap 3 (L) 5 - 15    Comment: Performed at [facility], [place] [address] [place], [place] [address]  CBC     Status: Abnormal   Collection Time: 11/12/18  5:33 AM  Result Value Ref Range   WBC 12.4 (H) 4.0 - 10.5 K/uL   RBC 1.60 (L) 4.22 - 5.81 MIL/uL   Hemoglobin 6.2 (LL) 13.0 - 17.0 g/dL    Comment: REPEATED  TO VERIFY THIS CRITICAL RESULT HAS VERIFIED AND BEEN CALLED TO JACK[place],V RN BY POTEAT,SHANNON ON 02 28 2020 AT 0633, AND HAS BEEN READ BACK. CRITICAL RESULT VERIFIED    HCT 18.0 (L) 39.0 - 52.0 %   MCV 112.5 (H) 80.0 - 100.0 fL   MCH 38.8 (H) 26.0 - 34.0 pg   MCHC 34.4 30.0 - 36.0 g/dL   RDW 23.0 (H) 11.5 - 15.5 %   Platelets 249 150 - 400 K/uL   nRBC 30.1 (H) 0.0 - 0.2 %    Comment: Performed at [facility], [place] [address] [place], [place] [address]  Bilirubin, direct     Status: Abnormal   Collection Time: 11/12/18  5:33 AM  Result Value Ref Range   Bilirubin, Direct 1.5 (H) 0.0 - 0.2 mg/dL    Comment: Performed at [facility], [place] [address] [place], [place] [address]  Prepare RBC     Status: None   Collection Time: 11/12/18 11:25 AM  Result Value Ref Range   Order Confirmation      ORDER PROCESSED BY BLOOD BANK Performed at [facility], [place] [address] [place],  [address]   Type and screen     Status: None   Collection Time: 11/12/18  1:42 PM  Result Value Ref Range   ABO/RH(D) A POS    Antibody Screen NEG    Sample Expiration 11/15/2018    Unit Number [id number]    Blood Component Type RED CELLS,LR    Unit division 00    Status of Unit ISSUED,FINAL    Transfusion Status OK TO TRANSFUSE    Crossmatch Result COMPATIBLE    Donor AG Type      NEGATIVE FOR S ANTIGEN NEGATIVE FOR e ANTIGEN NEGATIVE FOR KELL ANTIGEN NEGATIVE FOR C ANTIGEN  Urine rapid drug screen (  hosp performed)     Status: Abnormal   Collection Time: 11/12/18  3:49 PM  Result Value Ref Range   Opiates POSITIVE (A) NONE DETECTED   Cocaine NONE DETECTED NONE DETECTED   Benzodiazepines NONE DETECTED NONE DETECTED   Amphetamines NONE DETECTED NONE DETECTED   Tetrahydrocannabinol NONE DETECTED NONE DETECTED   Barbiturates NONE DETECTED NONE DETECTED    Comment: (NOTE) DRUG SCREEN FOR MEDICAL PURPOSES ONLY.  IF CONFIRMATION IS  NEEDED FOR ANY PURPOSE, NOTIFY LAB WITHIN 5 DAYS. LOWEST DETECTABLE LIMITS FOR URINE DRUG SCREEN Drug Class                     Cutoff (ng/mL) Amphetamine and metabolites    1000 Barbiturate and metabolites    200 Benzodiazepine                 628 Tricyclics and metabolites     300 Opiates and metabolites        300 Cocaine and metabolites        300 THC                            50 Performed at [facility], [place] [address] [place], [place] [address]   Urinalysis, Complete w Microscopic     Status: Abnormal   Collection Time: 11/12/18  3:49 PM  Result Value Ref Range   Color, Urine AMBER (A) YELLOW    Comment: BIOCHEMICALS MAY BE AFFECTED BY COLOR   APPearance CLEAR CLEAR   Specific Gravity, Urine 1.019 1.005 - 1.030   pH 6.0 5.0 - 8.0   Glucose, UA NEGATIVE NEGATIVE mg/dL   Hgb urine dipstick NEGATIVE NEGATIVE   Bilirubin Urine NEGATIVE NEGATIVE   Ketones, ur NEGATIVE NEGATIVE mg/dL   Protein, ur 30 (A) NEGATIVE mg/dL   Nitrite NEGATIVE NEGATIVE   Leukocytes,Ua NEGATIVE NEGATIVE   RBC / HPF 0-5 0 - 5 RBC/hpf   WBC, UA 0-5 0 - 5 WBC/hpf   Bacteria, UA RARE (A) NONE SEEN   Mucus PRESENT     Comment: Performed at [facility], [place] [address], [place], [place] [address]    Current Facility-Administered Medications  Medication Dose Route Frequency Provider Last Rate Last Dose  . 0.45 % sodium chloride infusion   Intravenous Continuous Howerter, Justin B, DO 125 mL/hr at 11/13/18 3710    . 0.9 %  sodium chloride infusion (Manually program via Guardrails IV Fluids)   Intravenous Once Dorena Dew, FNP      . acetaminophen (TYLENOL) tablet 650 mg  650 mg Oral Q6H PRN Dorena Dew, FNP   650 mg at 11/12/18 1306  . albuterol (PROVENTIL) (2.5 MG/3ML) 0.083% nebulizer solution 2.5 mg  2.5 mg Nebulization Q4H PRN Howerter, Justin B, DO      . buPROPion (WELLBUTRIN XL) 24 hr tablet 150 mg  150 mg Oral Daily Howerter, Justin B, DO   150  mg at 11/13/18 1030  . ceFEPIme (MAXIPIME) 1 g in sodium chloride 0.9 % 100 mL IVPB  1 g Intravenous Q8H Angela Adam, RPH 200 mL/hr at 11/13/18 6269 1 g at 11/13/18 4854  . citalopram (CELEXA) tablet 40 mg  40 mg Oral Daily Howerter, Justin B, DO   40 mg at 11/13/18 1030  . diphenhydrAMINE (BENADRYL) capsule 25-50 mg  25-50 mg Oral Q4H PRN Howerter, Justin B, DO      . gabapentin (  NEURONTIN) capsule 300 mg  300 mg Oral TID Howerter, Justin B, DO   300 mg at 11/13/18 1030  . haloperidol lactate (HALDOL) injection 2 mg  2 mg Intravenous Q6H PRN Dorena Dew, FNP   2 mg at 11/12/18 1327  . HYDROmorphone (DILAUDID) injection 2 mg  2 mg Intravenous Q3H PRN Dorena Dew, FNP   2 mg at 11/13/18 0630  . hydroxyurea (HYDREA) capsule 500 mg  500 mg Oral BID Howerter, Justin B, DO   500 mg at 11/13/18 1030  . ketorolac (TORADOL) 15 MG/ML injection 15 mg  15 mg Intravenous Q6H Dorena Dew, FNP   15 mg at 11/13/18 0630  . lip balm (CARMEX) ointment   Topical PRN Howerter, Justin B, DO      . nicotine (NICODERM CQ - dosed in mg/24 hours) patch 14 mg  14 mg Transdermal Daily PRN Howerter, Justin B, DO      . ondansetron (ZOFRAN) injection 4 mg  4 mg Intravenous Q4H PRN Howerter, Justin B, DO   4 mg at 11/12/18 0948  . ondansetron (ZOFRAN) tablet 4 mg  4 mg Oral Q6H PRN Howerter, Justin B, DO       Or  . ondansetron (ZOFRAN) injection 4 mg  4 mg Intravenous Q6H PRN Howerter, Justin B, DO      . sodium chloride flush (NS) 0.9 % injection 10-40 mL  10-40 mL Intracatheter PRN [name], MD        Musculoskeletal: Strength & Muscle Tone: within normal limits Gait & Station: patient lying in bed.  Patient leans: N/A  Psychiatric Specialty Exam: Physical Exam  Constitutional: No distress.  Psychiatric: His speech is normal and behavior is normal. Judgment and thought content normal. Cognition and memory are normal. He exhibits a depressed mood.  Speech slow due to pain with  talking.     Review of Systems  Constitutional: Positive for malaise/fatigue.  HENT: Negative.   Eyes: Negative.   Respiratory: Negative.   Cardiovascular: Negative.   Gastrointestinal: Positive for abdominal pain.  Genitourinary: Positive for flank pain.  Musculoskeletal: Positive for joint pain and myalgias.  Skin: Negative.   Neurological: Negative.   Endo/Heme/Allergies:       Sickle Cell Anemia  Psychiatric/Behavioral: Negative.  Negative for suicidal ideas.    Blood pressure 105/62, pulse 83, temperature 98.3 F (36.8 C), resp. rate 14, weight 87 kg, SpO2 97 %.Body mass index is 29.16 kg/m.  General Appearance: Fairly Groomed  Eye Contact:  Minimal  Speech:  Slow "It hurts to talk"  Volume:  Decreased  Mood:  Dysphoric  Affect:  Restricted  Thought Process:  Coherent and Linear  Orientation:  Full (Time, Place, and Person)  Thought Content:  Logical  Suicidal Thoughts:  No  Homicidal Thoughts:  No  Memory:  Immediate;   Fair Recent;   Fair Remote;   Fair  Judgement:  Fair  Insight:  Fair  Psychomotor Activity:  Decreased  Concentration:  Concentration: Fair and Attention Span: Fair  Recall:  [employer] of Knowledge:  Fair  Language:  Fair  Akathisia:  Negative  Handed:  Right  AIMS (if indicated):     Assets:  Desire for Improvement  ADL's:  Intact  Cognition:  WNL  Sleep:   Fair     Treatment Plan Summary:  Patient with a history of depression and sickle cell anemia who was admitted from [facility] jail due to pain crisis. Currently he denies  suicidal thoughts and appears calm and cooperative. However, patient reports being in severe pain and he hopes his pain is fully addressed.  Recommendations: 1. Continue with citalopram 40 mg by mouth daily for depression 2. Continue with Wellbutrin XL 150 mg by mouth daily for depression.  3. Consider treating the underlying cause of depression, which the patient states is uncontrolled pain.  4.Consider  social work consult to facilitate outpatient psychiatric provider referral for treatment of depression upon discharge.  5. Psychiatric services signing off.     Disposition:  No evidence of imminent risk to self or others at present.   Supportive therapy provided about ongoing stressors. Psychiatric services signing off. Re-consult as needed.   Corena Pilgrim, MD 11/13/2018 10:32 AM

## 2018-11-13 NOTE — Progress Notes (Signed)
Subjective: A 23 year old gentleman brought in from jail with sickle cell crises nonuse of chronic pain syndrome, polysubstance abuse.  Patient had worsening fever today, acute hemolytic crisis with hemoglobin down to 4.7.  Significant hypoxic respiratory failure with rapid response call today.  He is currently on oxygen and improved.  No evidence of infection anywhere.  He is currently drowsy.  Objective: Vital signs in last 24 hours: Temp:  [98.3 F (36.8 C)-101 F (38.3 C)] 98.3 F (36.8 C) (02/29 0327) Pulse Rate:  [82-98] 83 (02/29 0327) Resp:  [14-24] 14 (02/29 0327) BP: (99-107)/(43-64) 105/62 (02/29 0327) SpO2:  [89 %-100 %] 97 % (02/29 0327) Weight:  [87 kg] 87 kg (02/29 0500) Weight change: 2.8 kg Last BM Date: 11/11/18  Intake/Output from previous day: 02/28 0701 - 02/29 0700 In: 0  Out: 1575 [Urine:1575] Intake/Output this shift: No intake/output data recorded.  General appearance: alert, cooperative, appears stated age and no distress Neck: no adenopathy, no carotid bruit, no JVD, supple, symmetrical, trachea midline and thyroid not enlarged, symmetric, no tenderness/mass/nodules Back: symmetric, no curvature. ROM normal. No CVA tenderness. Resp: clear to auscultation bilaterally Cardio: regular rate and rhythm, S1, S2 normal, no murmur, click, rub or gallop GI: soft, non-tender; bowel sounds normal; no masses,  no organomegaly Extremities: extremities normal, atraumatic, no cyanosis or edema Pulses: 2+ and symmetric Skin: Skin color, texture, turgor normal. No rashes or lesions Neurologic: Grossly normal  Lab Results: Recent Labs    11/11/18 1712 11/12/18 0533 11/13/18 1036  WBC 12.4* 12.4*  --   HGB 7.3* 6.2* 4.7*  HCT 20.3* 18.0* 14.1*  PLT 316 249  --    BMET Recent Labs    11/11/18 1712 11/12/18 0533  NA 141 141  K 4.4 4.0  CL 108 107  CO2 27 31  GLUCOSE 102* 97  BUN 8 9  CREATININE 0.66 0.80  CALCIUM 8.7* 9.0    Studies/Results: Ct  Abdomen Pelvis W Contrast  Result Date: 11/11/2018 CLINICAL DATA:  23 year old male with abnormal LFTs. Periumbilical pain. EXAM: CT ABDOMEN AND PELVIS WITH CONTRAST TECHNIQUE: Multidetector CT imaging of the abdomen and pelvis was performed using the standard protocol following bolus administration of intravenous contrast. CONTRAST:  <See Chart> ISOVUE-300 IOPAMIDOL (ISOVUE-300) INJECTION 61% COMPARISON:  Abdominal ultrasound dated 11/11/2018 FINDINGS: Lower chest: The visualized lung bases are clear. No intra-abdominal free air or free fluid. Hepatobiliary: Top-normal liver size. There is slight heterogeneity of the liver. Clinical correlation is recommended to exclude hepatitis. The gallbladder is unremarkable. Pancreas: Unremarkable. No pancreatic ductal dilatation or surrounding inflammatory changes. Spleen: Splenectomy. Adrenals/Urinary Tract: The adrenal glands are unremarkable. There is no hydronephrosis on either side. The urinary bladder is unremarkable. Stomach/Bowel: There is large amount of stool throughout the colon. No bowel obstruction or active inflammation. Normal appendix. Vascular/Lymphatic: The abdominal aorta and IVC appear unremarkable. No portal venous gas. There is no adenopathy. Reproductive: The prostate and seminal vesicles are grossly unremarkable. No pelvic mass. Other: None Musculoskeletal: Patchy areas of sclerosis throughout the spine and sacrum and pelvis and femur may represent areas of avascular necrosis or related to underlying sickle cell disease. No acute osseous pathology. IMPRESSION: 1. Top-normal and slightly heterogeneous liver. Clinical correlation is recommended to exclude hepatitis. 2. Large amount of stool throughout the colon. No bowel obstruction or active inflammation. Normal appendix. 3. Splenectomy. 4. Patchy sclerotic areas throughout the osseous structures, likely sequela of sickle cell disease. Electronically Signed   By: Laren Everts.D.  On: 11/11/2018  22:27   Dg Chest Port 1 View  Result Date: 11/12/2018 CLINICAL DATA:  Acute chest syndrome, sickle cell EXAM: PORTABLE CHEST 1 VIEW COMPARISON:  09/30/2018 FINDINGS: The patient is rotated towards the left. There is no focal parenchymal opacity. There is no pleural effusion or pneumothorax. The heart mediastinum are stable. The osseous structures are unremarkable. IMPRESSION: No active disease. Electronically Signed   By: Kathreen Devoid   On: 11/12/2018 14:34   US Abdomen Limited Ruq  Result Date: 11/11/2018 CLINICAL DATA:  Elevated LFTs EXAM: ULTRASOUND ABDOMEN LIMITED RIGHT UPPER QUADRANT COMPARISON:  None. FINDINGS: Gallbladder: 8 mm mobile gallstone within the gallbladder. No wall thickening or sonographic Murphy sign. Common bile duct: Diameter: Normal caliber, 3 mm Liver: No focal lesion identified. Within normal limits in parenchymal echogenicity. Portal vein is patent on color Doppler imaging with normal direction of blood flow towards the liver. IMPRESSION: Cholelithiasis.  No sonographic evidence of acute cholecystitis. Electronically Signed   By: Rolm Baptise M.D.   On: 11/11/2018 19:08    Medications: I have reviewed the patient's current medications.  Assessment/Plan: #1 sickle cell painful crisis: Patient is hypoxic today, he has more pain but cannot get pain medications due to the relative hypoxia.  We will continue pain management.  Continue close monitoring.  If oxygen level drops patient will be transferred to stepdown unit.  #2 anemia: Hemoglobin is back to 6.1 after transfusion.  Continue current treatment  #3 leukocytosis: White count is elevated.  Continue close monitoring.  #4 acute transaminitis: LFTs back to normal.  Continue monitoring.  #5 tobacco abuse: Nicotine patch continue.  #6 depression: Continue Celexa and Wellbutrin.  #7 polysubstance abuse: Continue counseling  #8 SIRS: No fever.  This appears to be improving.  #9 urinary retention: Tried in and  out catheterization.  Keep monitor    LOS: 1 day   Rajvir Ernster,LAWAL 11/13/2018, 11:11 AM

## 2018-11-13 NOTE — Progress Notes (Signed)
Writer alerted MD (Dr. Jonelle Sidle) in person at this time about Hgb 4.7 Pt to receive one unit PRBCs.

## 2018-11-13 NOTE — Progress Notes (Signed)
RR and Probation officer in room. Pt now on 3L/min O2 with sats 98%. Pt alert. BP improving. WIll continue to monitor pt on floor and call RR if needed. MD aware.

## 2018-11-13 NOTE — Progress Notes (Signed)
This is a [facility] patient. Increased fever and decreased O2 sats found on pt upon entering room a few minutes after 1400 today. RR called and present. MD aware.

## 2018-11-13 NOTE — Significant Event (Signed)
Rapid Response Event Note  Overview: Time Called: 1438 Arrival Time: 8875 Event Type: Respiratory, Neurologic  Initial Focused Assessment:  Pt drowsy but arouseable in bed. O2 saturation 100% on non-rebreather. Pt able to answer orientation questions appropriately. Per RN report pt received dilaudid 2mg  at 1400.  Temp: 100.6 NSR-ST 95-110 BP 120s/60s  Interventions: CBG-113 Did not give narcan due to patient being responsive and pain score 8/10. Oxygen was able to be titrated down from non-rebreather to [place] 3L. Temperature down to 100.6 after treatment with tylenol.  Plan of Care (if not transferred): Pt remains in 1502. Hgb 4.7 and is awaiting 1 unit PRBC transfusion. Will follow up. Instructed RN to call Rapid if respiratory or neurological event occurs again. Spoke with Jonelle Sidle, MD regarding pt current condition. MD in agreement to keep pt on tele and give blood transfusion.          Wray Kearns, RN

## 2018-11-14 LAB — CBC WITH DIFFERENTIAL/PLATELET
Abs Immature Granulocytes: 0.22 10*3/uL — ABNORMAL HIGH (ref 0.00–0.07)
BASOS ABS: 0 10*3/uL (ref 0.0–0.1)
Basophils Relative: 0 %
Eosinophils Absolute: 0.1 10*3/uL (ref 0.0–0.5)
Eosinophils Relative: 1 %
HCT: 17.2 % — ABNORMAL LOW (ref 39.0–52.0)
Hemoglobin: 6.1 g/dL — CL (ref 13.0–17.0)
Immature Granulocytes: 2 %
LYMPHS PCT: 18 %
Lymphs Abs: 2.6 10*3/uL (ref 0.7–4.0)
MCH: 35.5 pg — ABNORMAL HIGH (ref 26.0–34.0)
MCHC: 35.5 g/dL (ref 30.0–36.0)
MCV: 100 fL (ref 80.0–100.0)
Monocytes Absolute: 1.6 10*3/uL — ABNORMAL HIGH (ref 0.1–1.0)
Monocytes Relative: 11 %
Neutro Abs: 10 10*3/uL — ABNORMAL HIGH (ref 1.7–7.7)
Neutrophils Relative %: 68 %
Platelets: 233 10*3/uL (ref 150–400)
RBC: 1.72 MIL/uL — ABNORMAL LOW (ref 4.22–5.81)
RDW: 23.8 % — ABNORMAL HIGH (ref 11.5–15.5)
WBC: 14.5 10*3/uL — ABNORMAL HIGH (ref 4.0–10.5)
nRBC: 21.6 % — ABNORMAL HIGH (ref 0.0–0.2)

## 2018-11-14 LAB — COMPREHENSIVE METABOLIC PANEL
ALT: 176 U/L — ABNORMAL HIGH (ref 0–44)
ANION GAP: 4 — AB (ref 5–15)
AST: 117 U/L — ABNORMAL HIGH (ref 15–41)
Albumin: 3 g/dL — ABNORMAL LOW (ref 3.5–5.0)
Alkaline Phosphatase: 70 U/L (ref 38–126)
BUN: 15 mg/dL (ref 6–20)
CALCIUM: 8 mg/dL — AB (ref 8.9–10.3)
CO2: 26 mmol/L (ref 22–32)
Chloride: 105 mmol/L (ref 98–111)
Creatinine, Ser: 0.69 mg/dL (ref 0.61–1.24)
GFR calc Af Amer: >60 mL/min (ref >60)
GFR calc non Af Amer: >60 mL/min (ref >60)
Glucose, Bld: 103 mg/dL — ABNORMAL HIGH (ref 70–99)
Potassium: 4.6 mmol/L (ref 3.5–5.1)
Sodium: 135 mmol/L (ref 135–145)
Total Bilirubin: 8.3 mg/dL — ABNORMAL HIGH (ref 0.3–1.2)
Total Protein: 6 g/dL — ABNORMAL LOW (ref 6.5–8.1)

## 2018-11-14 LAB — INFLUENZA PANEL BY PCR (TYPE A & B)
Influenza A By PCR: NEGATIVE
Influenza B By PCR: NEGATIVE

## 2018-11-14 NOTE — Progress Notes (Signed)
Subjective: Patient is doing much better today.  He is still requiring 2 L of oxygen.  Hemoglobin has improved however.  No fever today.  Patient still complaining of pain.  Patient has been in bed and not moving around.  Patient has improved mental status.  Objective: Vital signs in last 24 hours: Temp:  [98.5 F (36.9 C)-99.5 F (37.5 C)] 99.1 F (37.3 C) (03/01 2037) Pulse Rate:  [85-95] 85 (03/01 2037) Resp:  [18] 18 (03/01 2037) BP: (85-120)/(44-68) 120/66 (03/01 2037) SpO2:  [85 %-98 %] 97 % (03/01 2037) Weight:  [86.5 kg] 86.5 kg (03/01 0454) Weight change: -0.5 kg Last BM Date: 11/11/18  Intake/Output from previous day: 02/29 0701 - 03/01 0700 In: 6896 [I.V.:6106.6; Blood:290; IV Piggyback:499.5] Out: 1000 [Urine:1000] Intake/Output this shift: No intake/output data recorded.  General appearance: alert, cooperative, appears stated age and no distress Neck: no adenopathy, no carotid bruit, no JVD, supple, symmetrical, trachea midline and thyroid not enlarged, symmetric, no tenderness/mass/nodules Back: symmetric, no curvature. ROM normal. No CVA tenderness. Resp: clear to auscultation bilaterally Cardio: regular rate and rhythm, S1, S2 normal, no murmur, click, rub or gallop GI: soft, non-tender; bowel sounds normal; no masses,  no organomegaly Extremities: extremities normal, atraumatic, no cyanosis or edema Pulses: 2+ and symmetric Skin: Skin color, texture, turgor normal. No rashes or lesions Neurologic: Grossly normal  Lab Results: Recent Labs    11/12/18 0533 11/13/18 1036 11/14/18 1010  WBC 12.4*  --  14.5*  HGB 6.2* 4.7* 6.1*  HCT 18.0* 14.1* 17.2*  PLT 249  --  233   BMET Recent Labs    11/12/18 0533 11/14/18 1010  NA 141 135  K 4.0 4.6  CL 107 105  CO2 31 26  GLUCOSE 97 103*  BUN 9 15  CREATININE 0.80 0.69  CALCIUM 9.0 8.0*    Studies/Results: No results found.  Medications: I have reviewed the patient's current  medications.  Assessment/Plan: #1 sickle cell painful crisis: Patient is improved.  No hypoxia.  No fever today.  Hemoglobin is improved.  Continue his oral as well as IV pain medications.  No PCA at this point.  #2 anemia: Hemoglobin is back to 6.1 after transfusion.  Continue current treatment  #3 leukocytosis: White count is elevated.  Continue close monitoring.  #4 acute transaminitis: LFTs back to normal.  Continue monitoring.  #5 tobacco abuse: Nicotine patch continue.  #6 depression: Continue Celexa and Wellbutrin.  #7 polysubstance abuse: Continue counseling  #8 SIRS: No fever.  This appears to be improving.  #9 urinary retention: Tried in and out catheterization.  Keep monitor   LOS: 2 days   GARBA,LAWAL 11/14/2018, 9:39 PM

## 2018-11-15 DIAGNOSIS — Z720 Tobacco use: Secondary | ICD-10-CM

## 2018-11-15 LAB — BASIC METABOLIC PANEL
Anion gap: 6 (ref 5–15)
BUN: 12 mg/dL (ref 6–20)
CO2: 27 mmol/L (ref 22–32)
Calcium: 8.6 mg/dL — ABNORMAL LOW (ref 8.9–10.3)
Chloride: 103 mmol/L (ref 98–111)
Creatinine, Ser: 0.7 mg/dL (ref 0.61–1.24)
GFR calc Af Amer: >60 mL/min (ref >60)
GFR calc non Af Amer: >60 mL/min (ref >60)
Glucose, Bld: 99 mg/dL (ref 70–99)
Potassium: 4.8 mmol/L (ref 3.5–5.1)
Sodium: 136 mmol/L (ref 135–145)

## 2018-11-15 LAB — TYPE AND SCREEN
ABO/RH(D): A POS
Antibody Screen: NEGATIVE
Unit division: 0
Unit division: 0

## 2018-11-15 LAB — CBC
HCT: 19.2 % — ABNORMAL LOW (ref 39.0–52.0)
Hemoglobin: 6.5 g/dL — CL (ref 13.0–17.0)
MCH: 35.3 pg — ABNORMAL HIGH (ref 26.0–34.0)
MCHC: 33.9 g/dL (ref 30.0–36.0)
MCV: 104.3 fL — ABNORMAL HIGH (ref 80.0–100.0)
Platelets: 264 10*3/uL (ref 150–400)
RBC: 1.84 MIL/uL — ABNORMAL LOW (ref 4.22–5.81)
RDW: 23.1 % — ABNORMAL HIGH (ref 11.5–15.5)
WBC: 15 10*3/uL — ABNORMAL HIGH (ref 4.0–10.5)
nRBC: 19.2 % — ABNORMAL HIGH (ref 0.0–0.2)

## 2018-11-15 LAB — BPAM RBC
Blood Product Expiration Date: 202003262359
Blood Product Expiration Date: 202003282359
ISSUE DATE / TIME: 202002281817
ISSUE DATE / TIME: 202002292056
UNIT TYPE AND RH: 6200
Unit Type and Rh: 6200

## 2018-11-15 MED ORDER — OXYCODONE HCL 5 MG PO TABS
10.0000 mg | ORAL_TABLET | ORAL | Status: DC | PRN
  Administered 2018-11-15 – 2018-11-16 (×4): 10 mg via ORAL
  Filled 2018-11-15 (×4): qty 2

## 2018-11-15 NOTE — Progress Notes (Signed)
Subjective:  Jeffrey Morrison, a 23 year old male with a medical history significant for sickle cell anemia, chronic pain syndrome, polysubstance abuse was admitted and sickle cell pain crisis.  Patient is incarcerated, an Garment/textile technologist is stationed at the bedside. Patient continues to complain of pain characterized as generalized.  Pain intensity is 7/10.  He states that pain has not been relieved by current medication regimen.  Patient is afebrile and maintaining oxygen saturation above 90% on RA.  Patient denies headache, dizziness, chest pain, shortness of breath, dysuria, nausea, vomiting, or diarrhea.  Objective:  Vital signs in last 24 hours:  Vitals:   11/14/18 1642 11/14/18 2037 11/14/18 2256 11/15/18 0619  BP: 110/63 120/66  128/60  Pulse: 89 85  92  Resp:  18  19  Temp:  99.1 F (37.3 C)  98.4 F (36.9 C)  TempSrc:  Oral  Oral  SpO2:  97%  92%  Weight:   86.5 kg 81.6 kg  Height:   6\' 1"  (1.854 m)     Intake/Output from previous day:   Intake/Output Summary (Last 24 hours) at 11/15/2018 1000 Last data filed at 11/15/2018 1194 Gross per 24 hour  Intake 2355.95 ml  Output 4075 ml  Net -1719.05 ml    Physical Exam: General: Alert, awake, oriented x3, in no acute distress.  HEENT: [place]/AT [place]ER[place], EOMI Neck: Trachea midline,  no masses, no thyromegal,y no JVD, no carotid bruit OROPHARYNX:  Moist, No exudate/ erythema/lesions.  Heart: Regular rate and rhythm, without murmurs, rubs, gallops, PMI non-displaced, no heaves or thrills on palpation.  Lungs: Clear to auscultation, no wheezing or rhonchi noted. No increased vocal fremitus resonant to percussion  Abdomen: Soft, nontender, nondistended, positive bowel sounds, no masses no hepatosplenomegaly noted..  Neuro: No focal neurological deficits noted cranial nerves II through XII grossly intact. DTRs 2+ bilaterally upper and lower extremities. Strength 5 out of 5 in bilateral upper and lower extremities. Musculoskeletal: No warm  swelling or erythema around joints, no spinal tenderness noted. Psychiatric: Patient alert and oriented x3, good insight and cognition, good recent to remote recall. Lymph node survey: No cervical axillary or inguinal lymphadenopathy noted.  Lab Results:  Basic Metabolic Panel:    Component Value Date/Time   NA 135 11/14/2018 1010   NA 140 10/02/2017 1126   K 4.6 11/14/2018 1010   CL 105 11/14/2018 1010   CO2 26 11/14/2018 1010   BUN 15 11/14/2018 1010   BUN 9 10/02/2017 1126   CREATININE 0.69 11/14/2018 1010   GLUCOSE 103 (H) 11/14/2018 1010   CALCIUM 8.0 (L) 11/14/2018 1010   CBC:    Component Value Date/Time   WBC 15.0 (H) 11/15/2018 0923   HGB 6.5 (LL) 11/15/2018 0923   HGB 7.5 (L) 10/02/2017 1126   HCT 19.2 (L) 11/15/2018 0923   HCT 22.0 (L) 10/02/2017 1126   PLT 264 11/15/2018 0923   PLT 511 (H) 10/02/2017 1126   MCV 104.3 (H) 11/15/2018 0923   MCV 94 10/02/2017 1126   NEUTROABS 10.0 (H) 11/14/2018 1010   NEUTROABS 8.8 (H) 10/02/2017 1126   LYMPHSABS 2.6 11/14/2018 1010   LYMPHSABS 5.2 (H) 10/02/2017 1126   MONOABS 1.6 (H) 11/14/2018 1010   EOSABS 0.1 11/14/2018 1010   EOSABS 0.2 10/02/2017 1126   BASOSABS 0.0 11/14/2018 1010   BASOSABS 0.1 10/02/2017 1126    Recent Results (from the past 240 hour(s))  MRSA PCR Screening     Status: None   Collection Time: 11/12/18 12:45 AM  Result Value Ref Range Status   MRSA by PCR NEGATIVE NEGATIVE Final    Comment:        The GeneXpert MRSA Assay (FDA approved for NASAL specimens only), is one component of a comprehensive MRSA colonization surveillance program. It is not intended to diagnose MRSA infection nor to guide or monitor treatment for MRSA infections. Performed at [facility], [place] [address], [place], [place] [address]   Urine Culture     Status: None   Collection Time: 11/12/18  3:49 PM  Result Value Ref Range Status   Specimen Description   Final    URINE, CLEAN CATCH Performed  at [facility], [place] [address], [place], [place] [address]    Special Requests   Final    NONE Performed at [facility], [place] [address] [place], [place] [address]    Culture   Final    NO GROWTH Performed at [facility], [place] [address] [place], [place] [address]    Report Status 11/13/2018 FINAL  Final    Studies/Results: No results found.  Medications: Scheduled Meds: . sodium chloride   Intravenous Once  . sodium chloride   Intravenous Once  . buPROPion  150 mg Oral Daily  . citalopram  40 mg Oral Daily  . gabapentin  300 mg Oral TID   Continuous Infusions: . sodium chloride 50 mL/hr at 11/15/18 0854  . ceFEPime (MAXIPIME) IV 1 g (11/15/18 0626)   PRN Meds:.acetaminophen, albuterol, diphenhydrAMINE, haloperidol lactate, HYDROmorphone (DILAUDID) injection, lip balm, nicotine, ondansetron, ondansetron **OR** ondansetron (ZOFRAN) IV, oxyCODONE, sodium chloride flush  Assessment/Plan: Principal Problem:   Depression Active Problems:   Sickle cell pain crisis (HCC)   Transaminitis   Tobacco abuse   LFT elevation   Total bilirubin, elevated   Low oxygen saturation   Agitation  Sickle cell pain crisis: Continue IV Dilaudid 1-2 mg every 3 hours as needed for severe pain. Patient continues to complain of increased pain, add oxycodone 10 mg every 4 hours as needed.  Anemia of chronic disease: Hemoglobin is 6.4.  Patient is s/p 1 unit of packed red blood cells.  CBC in a.m.  Leukocytosis: WBC count continues to be elevated.  Patient afebrile over the last 48 hours.  Continue close monitor.  Cefepime discontinued.  Acute transaminitis: LFTs trending down around patient's baseline.  Continue monitoring closely.  Tobacco dependence: Nicotine patch continued  Depression: Continue Celexa and Wellbutrin  Polysubstance abuse: Patient warrants continued counseling  SIRS: Patient afebrile.  Resolved.  Discontinue  antibiotics.  Urinary retention: Improved following and/out catheterization.  Continue to monitor closely  Code Status: Full Code Family Communication: N/A Disposition Plan: Not yet ready for discharge  [place], MSN, FNP-C Patient [place], [place] [id number] [phone]  If 5PM-7AM, please contact night-coverage.  11/15/2018, 10:00 AM  LOS: 3 days

## 2018-11-15 NOTE — Care Management Important Message (Signed)
Important Message  Patient Details  Name: Jeffrey Morrison MRN: [id number] Date of Birth: 1996/04/13   Medicare Important Message Given:  Yes    Kerin Salen 11/15/2018, 11:31 [place] Message  Patient Details  Name: Jeffrey Morrison MRN: [id number] Date of Birth: [birth year]   Medicare Important Message Given:  Yes    Kerin Salen 11/15/2018, 11:31 AM

## 2018-11-16 DIAGNOSIS — F321 Major depressive disorder, single episode, moderate: Secondary | ICD-10-CM

## 2018-11-16 LAB — CBC WITH DIFFERENTIAL/PLATELET
Abs Immature Granulocytes: 0.11 10*3/uL — ABNORMAL HIGH (ref 0.00–0.07)
BASOS ABS: 0 10*3/uL (ref 0.0–0.1)
Basophils Relative: 0 %
Eosinophils Absolute: 0.1 10*3/uL (ref 0.0–0.5)
Eosinophils Relative: 1 %
HCT: 18.8 % — ABNORMAL LOW (ref 39.0–52.0)
Hemoglobin: 6.4 g/dL — CL (ref 13.0–17.0)
Immature Granulocytes: 1 %
LYMPHS ABS: 2.8 10*3/uL (ref 0.7–4.0)
Lymphocytes Relative: 22 %
MCH: 35.4 pg — ABNORMAL HIGH (ref 26.0–34.0)
MCHC: 34 g/dL (ref 30.0–36.0)
MCV: 103.9 fL — ABNORMAL HIGH (ref 80.0–100.0)
Monocytes Absolute: 1.9 10*3/uL — ABNORMAL HIGH (ref 0.1–1.0)
Monocytes Relative: 15 %
Neutro Abs: 7.8 10*3/uL — ABNORMAL HIGH (ref 1.7–7.7)
Neutrophils Relative %: 61 %
Platelets: 321 10*3/uL (ref 150–400)
RBC: 1.81 MIL/uL — ABNORMAL LOW (ref 4.22–5.81)
RDW: 22.6 % — AB (ref 11.5–15.5)
WBC: 12.8 10*3/uL — ABNORMAL HIGH (ref 4.0–10.5)
nRBC: 15.2 % — ABNORMAL HIGH (ref 0.0–0.2)

## 2018-11-16 LAB — COMPREHENSIVE METABOLIC PANEL
ALBUMIN: 3.5 g/dL (ref 3.5–5.0)
ALT: 100 U/L — ABNORMAL HIGH (ref 0–44)
AST: 43 U/L — ABNORMAL HIGH (ref 15–41)
Alkaline Phosphatase: 66 U/L (ref 38–126)
Anion gap: 7 (ref 5–15)
BUN: 12 mg/dL (ref 6–20)
CO2: 27 mmol/L (ref 22–32)
Calcium: 8.5 mg/dL — ABNORMAL LOW (ref 8.9–10.3)
Chloride: 102 mmol/L (ref 98–111)
Creatinine, Ser: 0.72 mg/dL (ref 0.61–1.24)
GFR calc Af Amer: >60 mL/min (ref >60)
GFR calc non Af Amer: >60 mL/min (ref >60)
Glucose, Bld: 106 mg/dL — ABNORMAL HIGH (ref 70–99)
POTASSIUM: 4.2 mmol/L (ref 3.5–5.1)
Sodium: 136 mmol/L (ref 135–145)
Total Bilirubin: 3.4 mg/dL — ABNORMAL HIGH (ref 0.3–1.2)
Total Protein: 7 g/dL (ref 6.5–8.1)

## 2018-11-16 MED ORDER — HEPARIN SOD (PORK) LOCK FLUSH 100 UNIT/ML IV SOLN: 500.0000 [IU] | INTRAVENOUS | Status: DC | PRN

## 2018-11-16 MED ORDER — GABAPENTIN 300 MG PO CAPS: 300.0000 mg | ORAL_CAPSULE | Freq: Three times a day (TID) | ORAL | 0 refills | Status: DC

## 2018-11-16 MED ORDER — MUSCLE RUB 10-15 % EX CREA: 1.0000 "application " | TOPICAL_CREAM | CUTANEOUS | 0 refills | Status: DC | PRN

## 2018-11-16 MED ORDER — IBUPROFEN 800 MG PO TABS: 800.0000 mg | ORAL_TABLET | Freq: Four times a day (QID) | ORAL | 0 refills | Status: DC | PRN

## 2018-11-16 NOTE — Discharge Summary (Signed)
Physician Discharge Summary  Jeffrey Morrison WPY:[id number] DOB: 18-Sep-1995 DOA: 11/11/2018  PCP: Scot Jun, FNP  Admit date: 11/11/2018  Discharge date: 11/16/2018  Discharge Diagnoses:  Principal Problem:   Depression Active Problems:   Sickle cell pain crisis ([place])   Transaminitis   Tobacco abuse   LFT elevation   Total bilirubin, elevated   Low oxygen saturation   Agitation  Discharge Condition: Stable  Disposition:  Follow-up Information    Scot Jun, FNP. Schedule an appointment as soon as possible for a visit in 2 week(s).   Specialty:  Family Medicine Contact information: [place] [address] [phone]          Pt is discharged home in good condition and is to follow up with [name], FNP this week to have labs evaluated. Jeffrey Morrison is instructed to increase activity slowly and balance with rest for the next few days, and use prescribed medication to complete treatment of pain  Diet: Regular Wt Readings from Last 3 Encounters:  11/16/18 84.2 kg  07/15/18 90.8 kg  04/26/18 86.6 kg   History of present illness:   Jeffrey Morrison is a 23 y.o. male with medical history significant for sickle cell anemia, depression who is admitted to [facility] long hospital on 11/11/2018 with sickle cell pain crisis after presenting from [facility] to [facility] emergency department complaining of right shoulder and right hip pain. The patient reports 1 day of progressive right shoulder and right hip pain in a distribution and quality that is very similar to prior episodes of sickle cell pain crisis.  He denies any preceding trauma.  Denies any associated chest pain, shortness of breath, diaphoresis, palpitations, dizziness, presyncope, or syncope.  He also denies any associated nausea, vomiting, diarrhea, or any subjective fever, chills, rigors.  He is on hydroxyurea as an outpatient.  As treatment for the aforementioned right  shoulder and right hip discomfort, the patient was given PRN ibuprofen and gel, but reports no significant relief from this measure.  In the ED, per ROS, the patient acknowledged mild, intermittent, nonspecific abdominal discomfort over the last day.  Once again, he denies any associated nausea, vomiting, diarrhea, melena, or hematochezia.  He reports constipation over the last 2 to 3 days, and reports that his mild abdominal discomfort improves with laying on either his right or left side.  Reports continued flatus production.  He denies any residual abdominal discomfort at the time of my encounter with him.  ED Course: Vital signs in the ED were notable for the following: Temperature max 98.2; heart rate 60-72; blood pressure ranged from 119/70 5-1 30/85; respiratory rate 15-16, and oxygen saturation 97 to 99% on room air. Labs performed in the ED were notable for the following: CMP notable for alkaline phosphatase 59, AST 178 compared to 43 on 10/05/2018, ALT 279 compared to 53 on 10/05/2018, total bilirubin 6.4 compared to 2.4 on 10/05/2018.  Lipase 36. CBC was notable for white blood cell count of 12.4 relative to 18 on 09/30/2018, and hemoglobin 7.3 compared to 6.8 on 09/30/2018; platelets 316.  CT abdomen/pelvis with IV contrast, per final radiology report showed slight heterogenicity of the liver as well as a large amount of stool throughout the colon, but otherwise no evidence of acute intra-abdominal process. A limited right upper quadrant ultrasound, per final radiology report, showed cholelithiasis but no evidence of acute cholecystitis.  While in the ED, the following were administered: The  patient received a total of 6 mg of IV Dilaudid, Toradol 15 mg IV x1, and was started on half normal saline. Subsequently, the patient was admitted to the med telemetry floor for further evaluation and management presenting sickle cell pain crisis.  Hospital Course:  Patient was admitted for sickle cell pain  crisis and managed appropriately with IVF, IV Dilaudid via PCA and IV Toradol, as well as other adjunct therapies per sickle cell pain management protocols. Hb was down to 4.7 for which he was transfused with 1 unit of PRBC with improvement in Hb to baseline of 6.4 at the time of discharge. Patient is incarcerated, an Garment/textile technologist is stationed at the bedside. Liver enzymes were initially elevated but trended down during this admission and almost back to baseline at the time of discharge. Patient initially had elevated white cell count and fever for which he was started on empiric antibiotics on admission, however all cultures were negative, chest x-ray was normal, fever resolved and WBC trended down towards normal, antibiotic was discontinued appropriately. Patient received extensive counseling for polysubstance abuse and history of depression and anxiety. He was catheterized once for temporary urinary retention possibly from medication.  At the time of discharge patient was voiding well with no restriction, no pain.  Patient did well on pain regimen although he was still complaining of pain, but these are chronic pain and will not be appropriate to manage in the inpatient setting. The time of discharge today, patient was ambulating well eating well, drinking well, no fever, hemoglobin is at baseline, WBC almost within normal, liver function tests trended down to was baseline for patient, vital signs were within normal and he was saturating well on room air. Patient was discharged home today in a hemodynamically stable condition. Patient will follow-up with primary care physician [facility].  Discharge Exam: Vitals:   11/15/18 2023 11/16/18 0503  BP: (!) 97/57 (!) 99/56  Pulse: 88 68  Resp: 16 16  Temp: 99.9 F (37.7 C) 98.6 F (37 C)  SpO2: 92% 91%   Vitals:   11/15/18 1349 11/15/18 2023 11/16/18 0500 11/16/18 0503  BP: 100/63 (!) 97/57  (!) 99/56  Pulse: 79 88  68  Resp: 19 16  16    Temp: 99.3 F (37.4 C) 99.9 F (37.7 C)  98.6 F (37 C)  TempSrc:      SpO2: 97% 92%  91%  Weight:   84.2 kg   Height:        General appearance : Awake, alert, not in any distress. Speech Clear. Not toxic looking HEENT: Atraumatic and Normocephalic, pupils equally reactive to light and accomodation Neck: Supple, no JVD. No cervical lymphadenopathy.  Chest: Good air entry bilaterally, no added sounds  CVS: S1 S2 regular, no murmurs.  Abdomen: Bowel sounds present, Non tender and not distended with no gaurding, rigidity or rebound. Extremities: B/L Lower Ext shows no edema, both legs are warm to touch Neurology: Awake alert, and oriented X 3, CN II-XII intact, Non focal Skin: No Rash  Discharge Instructions  Discharge Instructions    Diet - low sodium heart healthy   Complete by:  As directed    Increase activity slowly   Complete by:  As directed      Allergies as of 11/16/2018   No Known Allergies     Medication List    TAKE these medications   acetaminophen 325 MG tablet Commonly known as:  TYLENOL Take 2 tablets (650 mg  total) by mouth every 6 (six) hours as needed for headache.   buPROPion 150 MG 24 hr tablet Commonly known as:  WELLBUTRIN XL Take 1 tablet (150 mg total) by mouth daily.   citalopram 40 MG tablet Commonly known as:  CELEXA Take 1 tablet (40 mg total) by mouth daily.   elvitegravir-cobicistat-emtricitabine-tenofovir 150-150-200-10 MG Tabs tablet Commonly known as:  GENVOYA Take 1 tablet by mouth daily with breakfast.   folic acid 1 MG tablet Commonly known as:  FOLVITE Take 1 tablet (1 mg total) by mouth daily.   gabapentin 300 MG capsule Commonly known as:  NEURONTIN Take 1 capsule (300 mg total) by mouth 3 (three) times daily.   hydroxyurea 500 MG capsule Commonly known as:  HYDREA Take 1 capsule (500 mg total) by mouth 2 (two) times daily. May take with food to minimize GI side effects.   ibuprofen 800 MG tablet Commonly known as:   ADVIL,MOTRIN Take 1 tablet (800 mg total) by mouth every 6 (six) hours as needed (sickle aching cell pain and inflammation). What changed:  when to take this   MUSCLE RUB 10-15 % Crea Apply 1 application topically as needed for muscle pain.       The results of significant diagnostics from this hospitalization (including imaging, microbiology, ancillary and laboratory) are listed below for reference.    Significant Diagnostic Studies: Ct Abdomen Pelvis W Contrast  Result Date: 11/11/2018 CLINICAL DATA:  23 year old male with abnormal LFTs. Periumbilical pain. EXAM: CT ABDOMEN AND PELVIS WITH CONTRAST TECHNIQUE: Multidetector CT imaging of the abdomen and pelvis was performed using the standard protocol following bolus administration of intravenous contrast. CONTRAST:  <See Chart> ISOVUE-300 IOPAMIDOL (ISOVUE-300) INJECTION 61% COMPARISON:  Abdominal ultrasound dated 11/11/2018 FINDINGS: Lower chest: The visualized lung bases are clear. No intra-abdominal free air or free fluid. Hepatobiliary: Top-normal liver size. There is slight heterogeneity of the liver. Clinical correlation is recommended to exclude hepatitis. The gallbladder is unremarkable. Pancreas: Unremarkable. No pancreatic ductal dilatation or surrounding inflammatory changes. Spleen: Splenectomy. Adrenals/Urinary Tract: The adrenal glands are unremarkable. There is no hydronephrosis on either side. The urinary bladder is unremarkable. Stomach/Bowel: There is large amount of stool throughout the colon. No bowel obstruction or active inflammation. Normal appendix. Vascular/Lymphatic: The abdominal aorta and IVC appear unremarkable. No portal venous gas. There is no adenopathy. Reproductive: The prostate and seminal vesicles are grossly unremarkable. No pelvic mass. Other: None Musculoskeletal: Patchy areas of sclerosis throughout the spine and sacrum and pelvis and femur may represent areas of avascular necrosis or related to underlying  sickle cell disease. No acute osseous pathology. IMPRESSION: 1. Top-normal and slightly heterogeneous liver. Clinical correlation is recommended to exclude hepatitis. 2. Large amount of stool throughout the colon. No bowel obstruction or active inflammation. Normal appendix. 3. Splenectomy. 4. Patchy sclerotic areas throughout the osseous structures, likely sequela of sickle cell disease. Electronically Signed   By: Anner Crete M.D.   On: 11/11/2018 22:27   Dg Chest Port 1 View  Result Date: 11/12/2018 CLINICAL DATA:  Acute chest syndrome, sickle cell EXAM: PORTABLE CHEST 1 VIEW COMPARISON:  09/30/2018 FINDINGS: The patient is rotated towards the left. There is no focal parenchymal opacity. There is no pleural effusion or pneumothorax. The heart mediastinum are stable. The osseous structures are unremarkable. IMPRESSION: No active disease. Electronically Signed   By: Kathreen Devoid   On: 11/12/2018 14:34   US Abdomen Limited Ruq  Result Date: 11/11/2018 CLINICAL DATA:  Elevated LFTs EXAM: ULTRASOUND ABDOMEN  LIMITED RIGHT UPPER QUADRANT COMPARISON:  None. FINDINGS: Gallbladder: 8 mm mobile gallstone within the gallbladder. No wall thickening or sonographic Murphy sign. Common bile duct: Diameter: Normal caliber, 3 mm Liver: No focal lesion identified. Within normal limits in parenchymal echogenicity. Portal vein is patent on color Doppler imaging with normal direction of blood flow towards the liver. IMPRESSION: Cholelithiasis.  No sonographic evidence of acute cholecystitis. Electronically Signed   By: Rolm Baptise M.D.   On: 11/11/2018 19:08    Microbiology: Recent Results (from the past 240 hour(s))  MRSA PCR Screening     Status: None   Collection Time: 11/12/18 12:45 AM  Result Value Ref Range Status   MRSA by PCR NEGATIVE NEGATIVE Final    Comment:        The GeneXpert MRSA Assay (FDA approved for NASAL specimens only), is one component of a comprehensive MRSA colonization surveillance  program. It is not intended to diagnose MRSA infection nor to guide or monitor treatment for MRSA infections. Performed at [facility], [place] [address] [place], [place] [address]   Urine Culture     Status: None   Collection Time: 11/12/18  3:49 PM  Result Value Ref Range Status   Specimen Description   Final    URINE, CLEAN CATCH Performed at [facility], [place] [address] [place], [place] [address]    Special Requests   Final    NONE Performed at [facility], [place] [address] [place], [place] [address]    Culture   Final    NO GROWTH Performed at [facility], [place] [address], [place],  [address]    Report Status 11/13/2018 FINAL  Final     Labs: Basic Metabolic Panel: Recent Labs  Lab 11/11/18 1712 11/12/18 0533 11/14/18 1010 11/15/18 0923 11/16/18 1209  NA 141 141 135 136 136  K 4.4 4.0 4.6 4.8 4.2  CL 108 107 105 103 102  CO2 27 31 26 27 27   GLUCOSE 102* 97 103* 99 106*  BUN 8 9 15 12 12   CREATININE 0.66 0.80 0.69 0.70 0.72  CALCIUM 8.7* 9.0 8.0* 8.6* 8.5*  MG  --  2.4  --   --   --    Liver Function Tests: Recent Labs  Lab 11/11/18 1712 11/12/18 0533 11/14/18 1010 11/16/18 1209  AST 178* 175* 117* 43*  ALT 279* 275* 176* 100*  ALKPHOS 59 66 70 66  BILITOT 6.4* 7.1* 8.3* 3.4*  PROT 7.3 7.4 6.0* 7.0  ALBUMIN 4.2 4.2 3.0* 3.5   Recent Labs  Lab 11/11/18 2200  LIPASE 36   No results for input(s): AMMONIA in the last 168 hours. CBC: Recent Labs  Lab 11/11/18 1712 11/12/18 0533 11/13/18 1036 11/14/18 1010 11/15/18 0923 11/16/18 1209  WBC 12.4* 12.4*  --  14.5* 15.0* 12.8*  NEUTROABS 5.6  --   --  10.0*  --  7.8*  HGB 7.3* 6.2* 4.7* 6.1* 6.5* 6.4*  HCT 20.3* 18.0* 14.1* 17.2* 19.2* 18.8*  MCV 110.9* 112.5*  --  100.0 104.3* 103.9*  PLT 316 249  --  233 264 321   Cardiac Enzymes: No results for input(s): CKTOTAL, CKMB, CKMBINDEX, TROPONINI in the last 168  hours. BNP: Invalid input(s): POCBNP CBG: Recent Labs  Lab 11/13/18 1500  GLUCAP 113*    Time coordinating discharge: 50 minutes  Signed:  Aella Ronda  [facility] 11/16/2018, 12:57 PM

## 2018-11-16 NOTE — Discharge Instructions (Signed)
Sickle Cell Anemia, Adult ° °Sickle cell anemia is a condition in which red blood cells have an abnormal “sickle” shape. Red blood cells carry oxygen through the body. Sickle-shaped red blood cells do not live as long as normal red blood cells. They also clump together and block blood from flowing through the blood vessels. This condition prevents the body from getting enough oxygen. Sickle cell anemia causes organ damage and pain. It also increases the risk of infection. °What are the causes? °This condition is caused by a gene that is passed from parent to child (inherited). Receiving two copies of the gene causes the disease. Receiving one copy causes the "trait," which means that symptoms are milder or not present. °What increases the risk? °This condition is more likely to develop if your ancestors were from [place], the Mediterranean, South or [place], the [place], [place], or the Middle East. °What are the signs or symptoms? °Symptoms of this condition include: °· Episodes of pain (crises), especially in the hands and feet, joints, back, chest, or abdomen. The pain can be triggered by: °? An illness, especially if there is dehydration. °? Doing an activity with great effort (overexertion). °? Exposure to extreme temperature changes. °? High altitude. °· Fatigue. °· Shortness of breath or difficulty breathing. °· Dizziness. °· Pale skin or yellowed skin (jaundice). °· Frequent bacterial infections. °· Pain and swelling in the hands and feet (hand-food syndrome). °· Prolonged, painful erection of the penis (priapism). °· Acute chest syndrome. Symptoms of this include: °? Chest pain. °? Fever. °? Cough. °? Fast breathing. °· Stroke. °· Decreased activity. °· Loss of appetite. °· Change in behavior. °· Headaches. °· Seizures. °· Vision changes. °· Skin ulcers. °· Heart disease. °· High blood pressure. °· Gallstones. °· Liver and kidney problems. °How is this diagnosed? °This condition is diagnosed with  blood tests that check for the gene that causes this condition. °How is this treated? °There is no cure for most cases of this condition. Treatment focuses on managing your symptoms and preventing complications of the disease. Your health care provider will work with you to identify the best treatment options for you based on an assessment of your condition. Treatment may include: °· Medicines, including: °? Pain medicines. °? Antibiotic medicines for infection. °? Medicines to increase the production of a protein in red blood cells that helps carry oxygen in the body (hemoglobin). °· Fluids to treat pain and swelling. °· Oxygen to treat acute chest syndrome. °· Blood transfusions to treat symptoms such as fatigue, stroke, and acute chest syndrome. °· Massage and physical therapy for pain. °· Regular tests to monitor your condition, such as blood tests, X-rays, CT scans, MRI scans, ultrasounds, and lung function tests. These should be done every 3-12 months, depending on your age. °· Hematopoietic stem cell transplant. This is a procedure to replace abnormal stem cells with healthy stem cells from a donor's bone marrow. Stem cells are cells that can develop into blood cells, and bone marrow is the spongy tissue inside the bones. °Follow these instructions at home: °Medicines °· Take over-the-counter and prescription medicines only as told by your health care provider. °· If you were prescribed an antibiotic medicine, take it as told by your health care provider. Do not stop taking the antibiotic even if you start to feel better. °· If you develop a fever, do not take medicines to reduce the fever right away. This could cover up another problem. Notify your health care provider. °Managing   pain, stiffness, and swelling  Try these methods to help ease your pain: ? Using a heating pad. ? Taking a warm bath. ? Distracting yourself, such as by watching TV. Eating and drinking  Drink enough fluid to keep your urine  clear or pale yellow. Drink more in hot weather and during exercise.  Limit or avoid drinking alcohol.  Eat a balanced and nutritious diet. Eat plenty of fruits, vegetables, whole grains, and lean protein.  Take vitamins and supplements as directed by your health care provider. Traveling  When traveling, keep these with you: ? Your medical information. ? The names of your health care providers. ? Your medicines.  If you have to travel by air, ask about precautions you should take. Activity  Get plenty of rest.  Avoid activities that will lower your oxygen levels, such as exercising vigorously. General instructions  Do not use any products that contain nicotine or tobacco, such as cigarettes and e-cigarettes. They lower blood oxygen levels. If you need help quitting, ask your health care provider.  Consider wearing a medical alert bracelet.  Avoid high altitudes.  Avoid extreme temperatures and extreme temperature changes.  Keep all follow-up visits as told by your health care provider. This is important. Contact a health care provider if:  You develop joint pain.  Your feet or hands swell or have pain.  You have fatigue. Get help right away if:  You have symptoms of infection. These include: ? Fever. ? Chills. ? Extreme tiredness. ? Irritability. ? Poor eating. ? Vomiting.  You feel dizzy or faint.  You have new abdominal pain, especially on the left side near the stomach area.  You develop priapism.  You have numbness in your arms or legs or have trouble moving them.  You have trouble talking.  You develop pain that cannot be controlled with medicine.  You become short of breath.  You have rapid breathing.  You have a persistent cough.  You have pain in your chest.  You develop a severe headache or stiff neck.  You feel bloated without eating or after eating a small amount of food.  Your skin is pale.  You suddenly lose  vision. Summary  Sickle cell anemia is a condition in which red blood cells have an abnormal "sickle" shape. This disease can cause organ damage and chronic pain, and it can raise your risk of infection.  Sickle cell anemia is a genetic disorder.  Treatment focuses on managing your symptoms and preventing complications of the disease.  Get medical help right away if you have any signs of infection, such as a fever. This information is not intended to replace advice given to you by your health care provider. Make sure you discuss any questions you have with your health care provider. Document Released: 12/10/2005 Document Revised: 10/07/2016 Document Reviewed: 10/07/2016 Elsevier Interactive Patient Education  2019 [employer]vier Inc[employer]  

## 2018-11-16 NOTE — Care Management Note (Signed)
Case Management Note  Patient Details  Name: TYREESE THAIN MRN: [id number] Date of Birth: [birth year]  Subjective/Objective:                  dischaRGED  Action/Plan: NO HHC OR DME NEEDS  Expected Discharge Date:  11/16/18               Expected Discharge Plan:  Home/Self Care  In-House Referral:     Discharge planning Services  CM Consult  Post Acute Care Choice:    Choice offered to:     DME Arranged:    DME Agency:     HH Arranged:    HH Agency:     Status of Service:  Completed, signed off  If discussed at [employer] of Stay Meetings, dates discussed:    Additional Comments:  Leeroy Cha, RN 11/16/2018, 2:29 PM

## 2019-09-10 DIAGNOSIS — K5900 Constipation, unspecified: Secondary | ICD-10-CM | POA: Diagnosis not present

## 2019-09-10 DIAGNOSIS — M79605 Pain in left leg: Secondary | ICD-10-CM | POA: Diagnosis present

## 2019-09-10 DIAGNOSIS — E860 Dehydration: Secondary | ICD-10-CM | POA: Diagnosis present

## 2019-09-10 DIAGNOSIS — D5700 Hb-SS disease with crisis, unspecified: Secondary | ICD-10-CM | POA: Diagnosis present

## 2019-09-10 DIAGNOSIS — D72829 Elevated white blood cell count, unspecified: Secondary | ICD-10-CM | POA: Diagnosis not present

## 2019-09-10 DIAGNOSIS — F17210 Nicotine dependence, cigarettes, uncomplicated: Secondary | ICD-10-CM | POA: Diagnosis present

## 2019-09-10 DIAGNOSIS — Z832 Family history of diseases of the blood and blood-forming organs and certain disorders involving the immune mechanism: Secondary | ICD-10-CM | POA: Diagnosis not present

## 2019-09-10 DIAGNOSIS — M79602 Pain in left arm: Secondary | ICD-10-CM | POA: Diagnosis present

## 2019-09-10 DIAGNOSIS — Z20822 Contact with and (suspected) exposure to covid-19: Secondary | ICD-10-CM | POA: Diagnosis present

## 2019-09-10 DIAGNOSIS — Z9081 Acquired absence of spleen: Secondary | ICD-10-CM | POA: Diagnosis not present

## 2019-09-10 DIAGNOSIS — M79604 Pain in right leg: Secondary | ICD-10-CM | POA: Diagnosis present

## 2019-09-10 DIAGNOSIS — Z833 Family history of diabetes mellitus: Secondary | ICD-10-CM | POA: Diagnosis not present

## 2019-09-10 DIAGNOSIS — Z8673 Personal history of transient ischemic attack (TIA), and cerebral infarction without residual deficits: Secondary | ICD-10-CM | POA: Diagnosis not present

## 2019-09-25 DIAGNOSIS — Z8673 Personal history of transient ischemic attack (TIA), and cerebral infarction without residual deficits: Secondary | ICD-10-CM | POA: Diagnosis not present

## 2019-09-25 DIAGNOSIS — Z79899 Other long term (current) drug therapy: Secondary | ICD-10-CM | POA: Diagnosis not present

## 2019-09-25 DIAGNOSIS — M25511 Pain in right shoulder: Secondary | ICD-10-CM | POA: Diagnosis not present

## 2019-09-25 DIAGNOSIS — Z9081 Acquired absence of spleen: Secondary | ICD-10-CM | POA: Diagnosis not present

## 2019-09-25 DIAGNOSIS — D5700 Hb-SS disease with crisis, unspecified: Secondary | ICD-10-CM | POA: Diagnosis not present

## 2019-09-25 DIAGNOSIS — F17210 Nicotine dependence, cigarettes, uncomplicated: Secondary | ICD-10-CM | POA: Diagnosis not present

## 2019-09-26 DIAGNOSIS — D5700 Hb-SS disease with crisis, unspecified: Secondary | ICD-10-CM | POA: Diagnosis present

## 2019-09-26 DIAGNOSIS — F17210 Nicotine dependence, cigarettes, uncomplicated: Secondary | ICD-10-CM | POA: Diagnosis present

## 2019-09-26 DIAGNOSIS — Z79899 Other long term (current) drug therapy: Secondary | ICD-10-CM | POA: Diagnosis not present

## 2019-09-26 DIAGNOSIS — Z9081 Acquired absence of spleen: Secondary | ICD-10-CM | POA: Diagnosis not present

## 2019-09-26 DIAGNOSIS — M25511 Pain in right shoulder: Secondary | ICD-10-CM | POA: Diagnosis not present

## 2019-09-26 DIAGNOSIS — Z8673 Personal history of transient ischemic attack (TIA), and cerebral infarction without residual deficits: Secondary | ICD-10-CM | POA: Diagnosis not present

## 2020-02-02 DIAGNOSIS — D7589 Other specified diseases of blood and blood-forming organs: Secondary | ICD-10-CM | POA: Diagnosis present

## 2020-02-02 DIAGNOSIS — K8020 Calculus of gallbladder without cholecystitis without obstruction: Secondary | ICD-10-CM | POA: Diagnosis present

## 2020-02-02 DIAGNOSIS — D72829 Elevated white blood cell count, unspecified: Secondary | ICD-10-CM | POA: Diagnosis present

## 2020-02-02 DIAGNOSIS — Z8673 Personal history of transient ischemic attack (TIA), and cerebral infarction without residual deficits: Secondary | ICD-10-CM | POA: Diagnosis not present

## 2020-02-02 DIAGNOSIS — Z20822 Contact with and (suspected) exposure to covid-19: Secondary | ICD-10-CM | POA: Diagnosis present

## 2020-02-02 DIAGNOSIS — Z9081 Acquired absence of spleen: Secondary | ICD-10-CM | POA: Diagnosis not present

## 2020-02-02 DIAGNOSIS — D5700 Hb-SS disease with crisis, unspecified: Secondary | ICD-10-CM | POA: Diagnosis present

## 2020-02-02 DIAGNOSIS — F17210 Nicotine dependence, cigarettes, uncomplicated: Secondary | ICD-10-CM | POA: Diagnosis present

## 2020-03-08 DIAGNOSIS — B182 Chronic viral hepatitis C: Secondary | ICD-10-CM | POA: Diagnosis present

## 2020-03-08 DIAGNOSIS — F17200 Nicotine dependence, unspecified, uncomplicated: Secondary | ICD-10-CM | POA: Diagnosis present

## 2020-03-08 DIAGNOSIS — N4830 Priapism, unspecified: Secondary | ICD-10-CM | POA: Diagnosis present

## 2020-03-08 DIAGNOSIS — L732 Hidradenitis suppurativa: Secondary | ICD-10-CM | POA: Diagnosis present

## 2020-03-08 DIAGNOSIS — Z8673 Personal history of transient ischemic attack (TIA), and cerebral infarction without residual deficits: Secondary | ICD-10-CM | POA: Diagnosis not present

## 2020-03-08 DIAGNOSIS — K219 Gastro-esophageal reflux disease without esophagitis: Secondary | ICD-10-CM | POA: Diagnosis present

## 2020-03-08 DIAGNOSIS — K5900 Constipation, unspecified: Secondary | ICD-10-CM | POA: Diagnosis present

## 2020-03-08 DIAGNOSIS — F419 Anxiety disorder, unspecified: Secondary | ICD-10-CM | POA: Diagnosis present

## 2020-03-08 DIAGNOSIS — D5700 Hb-SS disease with crisis, unspecified: Secondary | ICD-10-CM | POA: Diagnosis not present

## 2020-12-11 ENCOUNTER — Ambulatory Visit: Payer: Self-pay | Admitting: Family Medicine

## 2020-12-14 ENCOUNTER — Emergency Department (HOSPITAL_COMMUNITY): Payer: Medicaid Other

## 2020-12-14 ENCOUNTER — Encounter (HOSPITAL_COMMUNITY): Payer: Self-pay | Admitting: Emergency Medicine

## 2020-12-14 ENCOUNTER — Inpatient Hospital Stay (HOSPITAL_COMMUNITY)
Admission: EM | Admit: 2020-12-14 | Discharge: 2020-12-23 | DRG: 812 | Disposition: A | Discharge status: Home / Self Care | Payer: Medicaid Other | Attending: Internal Medicine | Admitting: Internal Medicine

## 2020-12-14 ENCOUNTER — Other Ambulatory Visit: Payer: Self-pay

## 2020-12-14 DIAGNOSIS — G4733 Obstructive sleep apnea (adult) (pediatric): Secondary | ICD-10-CM | POA: Diagnosis present

## 2020-12-14 DIAGNOSIS — Z9081 Acquired absence of spleen: Secondary | ICD-10-CM

## 2020-12-14 DIAGNOSIS — Z720 Tobacco use: Secondary | ICD-10-CM | POA: Diagnosis present

## 2020-12-14 DIAGNOSIS — F1910 Other psychoactive substance abuse, uncomplicated: Secondary | ICD-10-CM | POA: Diagnosis present

## 2020-12-14 DIAGNOSIS — D5700 Hb-SS disease with crisis, unspecified: Principal | ICD-10-CM | POA: Diagnosis present

## 2020-12-14 DIAGNOSIS — Z79899 Other long term (current) drug therapy: Secondary | ICD-10-CM

## 2020-12-14 DIAGNOSIS — M545 Low back pain, unspecified: Secondary | ICD-10-CM | POA: Diagnosis present

## 2020-12-14 DIAGNOSIS — G894 Chronic pain syndrome: Secondary | ICD-10-CM | POA: Diagnosis present

## 2020-12-14 DIAGNOSIS — D638 Anemia in other chronic diseases classified elsewhere: Secondary | ICD-10-CM | POA: Diagnosis not present

## 2020-12-14 DIAGNOSIS — F32A Depression, unspecified: Secondary | ICD-10-CM | POA: Diagnosis present

## 2020-12-14 DIAGNOSIS — E860 Dehydration: Secondary | ICD-10-CM | POA: Diagnosis present

## 2020-12-14 DIAGNOSIS — F419 Anxiety disorder, unspecified: Secondary | ICD-10-CM | POA: Diagnosis present

## 2020-12-14 DIAGNOSIS — Z20822 Contact with and (suspected) exposure to covid-19: Secondary | ICD-10-CM | POA: Diagnosis present

## 2020-12-14 DIAGNOSIS — F321 Major depressive disorder, single episode, moderate: Secondary | ICD-10-CM | POA: Diagnosis not present

## 2020-12-14 DIAGNOSIS — F17210 Nicotine dependence, cigarettes, uncomplicated: Secondary | ICD-10-CM | POA: Diagnosis present

## 2020-12-14 LAB — CBC WITH DIFFERENTIAL/PLATELET
Abs Immature Granulocytes: 0.51 10*3/uL — ABNORMAL HIGH (ref 0.00–0.07)
Basophils Absolute: 0.1 10*3/uL (ref 0.0–0.1)
Basophils Relative: 1 %
Eosinophils Absolute: 0.3 10*3/uL (ref 0.0–0.5)
Eosinophils Relative: 2 %
HCT: 26.7 % — ABNORMAL LOW (ref 39.0–52.0)
Hemoglobin: 9.5 g/dL — ABNORMAL LOW (ref 13.0–17.0)
Immature Granulocytes: 3 %
Lymphocytes Relative: 25 %
Lymphs Abs: 4.7 10*3/uL — ABNORMAL HIGH (ref 0.7–4.0)
MCH: 36 pg — ABNORMAL HIGH (ref 26.0–34.0)
MCHC: 35.6 g/dL (ref 30.0–36.0)
MCV: 101.1 fL — ABNORMAL HIGH (ref 80.0–100.0)
Monocytes Absolute: 1.2 10*3/uL — ABNORMAL HIGH (ref 0.1–1.0)
Monocytes Relative: 7 %
Neutro Abs: 11.8 10*3/uL — ABNORMAL HIGH (ref 1.7–7.7)
Neutrophils Relative %: 62 %
Platelets: 402 10*3/uL — ABNORMAL HIGH (ref 150–400)
RBC: 2.64 MIL/uL — ABNORMAL LOW (ref 4.22–5.81)
RDW: 23.1 % — ABNORMAL HIGH (ref 11.5–15.5)
WBC: 18.6 10*3/uL — ABNORMAL HIGH (ref 4.0–10.5)
nRBC: 12.4 % — ABNORMAL HIGH (ref 0.0–0.2)

## 2020-12-14 LAB — BASIC METABOLIC PANEL
Anion gap: 8 (ref 5–15)
BUN: 12 mg/dL (ref 6–20)
CO2: 24 mmol/L (ref 22–32)
Calcium: 9.5 mg/dL (ref 8.9–10.3)
Chloride: 105 mmol/L (ref 98–111)
Creatinine, Ser: 0.95 mg/dL (ref 0.61–1.24)
GFR, Estimated: >60 mL/min (ref >60)
Glucose, Bld: 91 mg/dL (ref 70–99)
Potassium: 4.5 mmol/L (ref 3.5–5.1)
Sodium: 137 mmol/L (ref 135–145)

## 2020-12-14 LAB — RESP PANEL BY RT-PCR (FLU A&B, COVID) ARPGX2
Influenza A by PCR: NEGATIVE
Influenza B by PCR: NEGATIVE
SARS Coronavirus 2 by RT PCR: NEGATIVE

## 2020-12-14 LAB — RETICULOCYTES
Immature Retic Fract: 35.8 % — ABNORMAL HIGH (ref 2.3–15.9)
RBC.: 2.71 MIL/uL — ABNORMAL LOW (ref 4.22–5.81)
Retic Count, Absolute: 392.5 10*3/uL — ABNORMAL HIGH (ref 19.0–186.0)
Retic Ct Pct: 16.4 % — ABNORMAL HIGH (ref 0.4–3.1)

## 2020-12-14 MED ORDER — HYDROMORPHONE HCL 1 MG/ML IJ SOLN
1.0000 mg | Freq: Once | INTRAMUSCULAR | Status: AC
  Administered 2020-12-14: 1 mg via INTRAVENOUS
  Filled 2020-12-14: qty 1

## 2020-12-14 MED ORDER — HYDROMORPHONE HCL 1 MG/ML IJ SOLN
0.5000 mg | INTRAMUSCULAR | Status: AC
  Administered 2020-12-14: 0.5 mg via INTRAVENOUS

## 2020-12-14 MED ORDER — CITALOPRAM HYDROBROMIDE 20 MG PO TABS
40.0000 mg | ORAL_TABLET | Freq: Every day | ORAL | Status: DC
  Administered 2020-12-15 – 2020-12-23 (×9): 40 mg via ORAL
  Filled 2020-12-14 (×10): qty 2

## 2020-12-14 MED ORDER — KETOROLAC TROMETHAMINE 15 MG/ML IJ SOLN
15.0000 mg | Freq: Once | INTRAMUSCULAR | Status: AC
  Administered 2020-12-14: 15 mg via INTRAVENOUS

## 2020-12-14 MED ORDER — KETOROLAC TROMETHAMINE 15 MG/ML IJ SOLN
15.0000 mg | INTRAMUSCULAR | Status: DC
  Filled 2020-12-14: qty 1

## 2020-12-14 MED ORDER — OXYCODONE HCL 5 MG PO TABS
10.0000 mg | ORAL_TABLET | ORAL | Status: DC | PRN
  Administered 2020-12-15 – 2020-12-20 (×10): 10 mg via ORAL
  Filled 2020-12-14 (×10): qty 2

## 2020-12-14 MED ORDER — NALOXONE HCL 0.4 MG/ML IJ SOLN: 0.4000 mg | INTRAMUSCULAR | Status: DC | PRN

## 2020-12-14 MED ORDER — HYDROMORPHONE HCL 1 MG/ML IJ SOLN: 2.0000 mg | Freq: Once | INTRAMUSCULAR | Status: DC

## 2020-12-14 MED ORDER — ENOXAPARIN SODIUM 40 MG/0.4ML ~~LOC~~ SOLN
40.0000 mg | SUBCUTANEOUS | Status: DC
  Administered 2020-12-15 – 2020-12-22 (×8): 40 mg via SUBCUTANEOUS
  Filled 2020-12-14 (×10): qty 0.4

## 2020-12-14 MED ORDER — OXYCODONE-ACETAMINOPHEN 5-325 MG PO TABS: 1.0000 | ORAL_TABLET | Freq: Four times a day (QID) | ORAL | 0 refills | Status: DC | PRN

## 2020-12-14 MED ORDER — HYDROMORPHONE 1 MG/ML IV SOLN
INTRAVENOUS | Status: DC
Start: 2020-12-15 — End: 2020-12-15
  Administered 2020-12-14: 30 mg via INTRAVENOUS
  Administered 2020-12-15: 2.1 mg via INTRAVENOUS
  Administered 2020-12-15: 0.9 mg via INTRAVENOUS
  Administered 2020-12-15: 3 mg via INTRAVENOUS
  Administered 2020-12-15: 2.4 mg via INTRAVENOUS
  Filled 2020-12-14: qty 30

## 2020-12-14 MED ORDER — HYDROMORPHONE HCL 1 MG/ML IJ SOLN
0.5000 mg | INTRAMUSCULAR | Status: DC
  Filled 2020-12-14: qty 1

## 2020-12-14 MED ORDER — SODIUM CHLORIDE 0.45 % IV SOLN: INTRAVENOUS | Status: DC

## 2020-12-14 MED ORDER — OXYCODONE HCL 5 MG PO TABS
15.0000 mg | ORAL_TABLET | Freq: Once | ORAL | Status: AC
  Administered 2020-12-14: 15 mg via ORAL
  Filled 2020-12-14: qty 3

## 2020-12-14 MED ORDER — SODIUM CHLORIDE 0.9% FLUSH: 9.0000 mL | INTRAVENOUS | Status: DC | PRN

## 2020-12-14 MED ORDER — HYDROCODONE-ACETAMINOPHEN 5-325 MG PO TABS
1.0000 | ORAL_TABLET | Freq: Once | ORAL | Status: DC
Start: 2020-12-14 — End: 2020-12-14

## 2020-12-14 MED ORDER — ONDANSETRON HCL 4 MG/2ML IJ SOLN: 4.0000 mg | Freq: Four times a day (QID) | INTRAMUSCULAR | Status: DC | PRN

## 2020-12-14 MED ORDER — POLYETHYLENE GLYCOL 3350 17 G PO PACK: 17.0000 g | PACK | Freq: Every day | ORAL | Status: DC | PRN

## 2020-12-14 MED ORDER — SENNOSIDES-DOCUSATE SODIUM 8.6-50 MG PO TABS
1.0000 | ORAL_TABLET | Freq: Two times a day (BID) | ORAL | Status: DC
  Administered 2020-12-15 – 2020-12-23 (×17): 1 via ORAL
  Filled 2020-12-14 (×19): qty 1

## 2020-12-14 MED ORDER — HYDROMORPHONE HCL 1 MG/ML IJ SOLN
1.0000 mg | INTRAMUSCULAR | Status: DC
  Filled 2020-12-14: qty 1

## 2020-12-14 MED ORDER — FOLIC ACID 1 MG PO TABS
1.0000 mg | ORAL_TABLET | Freq: Every day | ORAL | Status: DC
  Administered 2020-12-15 – 2020-12-23 (×9): 1 mg via ORAL
  Filled 2020-12-14 (×10): qty 1

## 2020-12-14 MED ORDER — KETOROLAC TROMETHAMINE 15 MG/ML IJ SOLN
15.0000 mg | Freq: Four times a day (QID) | INTRAMUSCULAR | Status: AC
  Administered 2020-12-14 – 2020-12-19 (×20): 15 mg via INTRAVENOUS
  Filled 2020-12-14 (×20): qty 1

## 2020-12-14 MED ORDER — KETOROLAC TROMETHAMINE 15 MG/ML IJ SOLN
15.0000 mg | Freq: Once | INTRAMUSCULAR | Status: AC
  Administered 2020-12-14: 15 mg via INTRAVENOUS
  Filled 2020-12-14: qty 1

## 2020-12-14 MED ORDER — BUPROPION HCL ER (XL) 150 MG PO TB24
150.0000 mg | ORAL_TABLET | Freq: Every day | ORAL | Status: DC
  Administered 2020-12-15 – 2020-12-23 (×9): 150 mg via ORAL
  Filled 2020-12-14 (×10): qty 1

## 2020-12-14 MED ORDER — GABAPENTIN 300 MG PO CAPS
300.0000 mg | ORAL_CAPSULE | Freq: Three times a day (TID) | ORAL | Status: DC
  Administered 2020-12-15 – 2020-12-23 (×25): 300 mg via ORAL
  Filled 2020-12-14 (×26): qty 1

## 2020-12-14 MED ORDER — DIPHENHYDRAMINE HCL 12.5 MG/5ML PO ELIX: 12.5000 mg | ORAL_SOLUTION | Freq: Four times a day (QID) | ORAL | Status: DC | PRN

## 2020-12-14 MED ORDER — HYDROMORPHONE HCL 1 MG/ML IJ SOLN
2.0000 mg | INTRAMUSCULAR | Status: AC
  Administered 2020-12-14 (×2): 2 mg via INTRAVENOUS
  Filled 2020-12-14 (×2): qty 2

## 2020-12-14 MED ORDER — HYDROMORPHONE HCL 1 MG/ML IJ SOLN
1.0000 mg | Freq: Once | INTRAMUSCULAR | Status: AC
  Administered 2020-12-14: 1 mg via INTRAVENOUS

## 2020-12-14 NOTE — Discharge Planning (Addendum)
[facility] Drew J. Clydene Laming, RN, BSN, [place] [phone] ER CM consulted regarding PCP establishment and insurance enrollment.  NCM met with pt at bedside; pt confirms not having access to f/u care with PCP or insurance coverage. Discussed with patient importance and benefits of establishing PCP, and not utilizing the ER for primary care needs. Pt verbalized understanding and is in agreement.  NCM advised that Internal Medicine providers at [facility] are accepting new pts that are uninsured. Pt verbalized understanding and asked to have appointment arranged. NCM set up appointment at [facility] on 4/13 at 0920.

## 2020-12-14 NOTE — ED Provider Notes (Signed)
MSE was initiated and I personally evaluated the patient and placed orders (if any) at  11:13 AM on December 14, 2020.  The patient appears stable so that the remainder of the MSE may be completed by another provider.  Sickle cell chest pain back legs  No pain meds at home ... recent out of [place]  Pain for 1 hr    Patient placed in Quick Look pathway, seen and evaluated   Chief Complaint: Sickle cell pain   HPI:   25 y.o. M with PMH/o sickle cell pain who presents for evaluation of sickle cell pain. He states pain in his back, chest pain and BLE that started this AM. He states that this feels that this is similar to his sickle cell pain. He just got out of prison and doesn't have any pain meds.   ROS: back pain, chest pain, leg pain  Physical Exam:   Gen: No distress  Neuro: Awake and Alert  Skin: Warm    Focused Exam: lungs CTAB, no evidence of RRR   Initiation of care has begun. The patient has been counseled on the process, plan, and necessity for staying for the completion/evaluation, and the remainder of the medical screening examination    Portions of this note were generated with Dragon dictation software. Dictation errors may occur despite best attempts at proofreading.      Volanda Napoleon, PA-C 12/14/20 1515    Wyvonnia Dusky, MD 12/14/20 1755

## 2020-12-14 NOTE — ED Provider Notes (Signed)
[facility] EMERGENCY DEPARTMENT Provider Note   CSN: [id number] Arrival date & time: 12/14/20  1103     History No chief complaint on file.   Jeffrey Morrison is a 25 y.o. male with PMHx sickle cell anemia who presents to the ED today with complaint of gradual onset, constant, achy, chest pain and diffuse body pain that began 1 hour ago. Pt reports the pain started in his chest and then moved all over his body. He states this feels like similar sickle cell crisis in the past. Pt was recently released from prison and does not have any medications at home. He reports he used to see [place] Hollis, NP for sickle cell management. He denies fevers, chills, cough, SOB, nausea, vomiting, or any other associated symptoms.   The history is provided by the patient and medical records.       Past Medical History:  Diagnosis Date  . Chronic lower back pain   . Depression   . Sickle cell crisis ([place])   . Sickle cell disease [facility])     Patient Active Problem List   Diagnosis Date Noted  . Transaminitis 11/12/2018  . Tobacco abuse 11/12/2018  . LFT elevation   . Total bilirubin, elevated   . Low oxygen saturation   . Agitation   . Community acquired pneumonia   . Polysubstance abuse ([place]) 07/12/2018  . Homelessness 07/12/2018  . Wheezing on expiration 07/12/2018  . Cough 07/12/2018  . Dysuria 07/12/2018  . Hb-SS disease with crisis ([place])   . Wheezing   . Sickle cell crisis ([place]) 07/10/2018  . Sickle cell pain crisis ([place]) 04/21/2018  . CMV (cytomegalovirus) ([place]) 08/27/2017  . Erectile dysfunction 08/27/2017  . Depression 08/25/2017  . OSA (obstructive sleep apnea)   . Anemia of chronic disease     Past Surgical History:  Procedure Laterality Date  . SPLENECTOMY         Family History  Problem Relation Age of Onset  . Diabetes Mother     Social History   Tobacco Use  . Smoking status: Current Every Day Smoker    Packs/day: 0.50    Types: Cigarettes   . Smokeless tobacco: Never Used  Vaping Use  . Vaping Use: Never used  Substance Use Topics  . Alcohol use: No  . Drug use: Yes    Types: IV, Methamphetamines    Comment: heroin     Home Medications Prior to Admission medications   Medication Sig Start Date End Date Taking? Authorizing Provider  hydroxyurea (HYDREA) 500 MG capsule Take 1 capsule (500 mg total) by mouth 2 (two) times daily. May take with food to minimize GI side effects. 07/16/18  Yes Dorena Dew, FNP  oxyCODONE-acetaminophen (PERCOCET/ROXICET) 5-325 MG tablet Take 1 tablet by mouth every 6 (six) hours as needed for severe pain. 12/14/20  Yes Kadasia Kassing, PA-C  acetaminophen (TYLENOL) 325 MG tablet Take 2 tablets (650 mg total) by mouth every 6 (six) hours as needed for headache. Patient not taking: Reported on 12/14/2020 07/16/18   Dorena Dew, FNP  buPROPion (WELLBUTRIN XL) 150 MG 24 hr tablet Take 1 tablet (150 mg total) by mouth daily. Patient not taking: Reported on 12/14/2020 07/16/18   Dorena Dew, FNP  citalopram (CELEXA) 40 MG tablet Take 1 tablet (40 mg total) by mouth daily. Patient not taking: Reported on 12/14/2020 07/16/18   Dorena Dew, FNP  elvitegravir-cobicistat-emtricitabine-tenofovir (GENVOYA) 150-150-200-10 MG TABS tablet Take 1 tablet by  mouth daily with breakfast. Patient not taking: Reported on 12/14/2020 10/05/18   Robinson, [place] N, PA-C  folic acid (FOLVITE) 1 MG tablet Take 1 tablet (1 mg total) by mouth daily. Patient not taking: Reported on 12/14/2020 07/16/18   Dorena Dew, FNP  gabapentin (NEURONTIN) 300 MG capsule Take 1 capsule (300 mg total) by mouth 3 (three) times daily. Patient not taking: No sig reported 11/16/18   [name], MD  ibuprofen (ADVIL,MOTRIN) 800 MG tablet Take 1 tablet (800 mg total) by mouth every 6 (six) hours as needed (sickle aching cell pain and inflammation). Patient not taking: No sig reported 11/16/18   [name], MD   Menthol-Methyl Salicylate (MUSCLE RUB) 10-15 % CREA Apply 1 application topically as needed for muscle pain. Patient not taking: No sig reported 11/16/18   [name], MD    Allergies    Patient has no known allergies.  Review of Systems   Review of Systems  Constitutional: Negative for chills and fever.  Respiratory: Negative for cough and shortness of breath.   Cardiovascular: Positive for chest pain.  Gastrointestinal: Negative for nausea and vomiting.  Musculoskeletal: Positive for arthralgias.  All other systems reviewed and are negative.   Physical Exam Updated Vital Signs BP 131/85 (BP Location: Right Arm)   Pulse 89   Temp 99.1 F (37.3 C)   Resp (!) 22   SpO2 100%   Physical Exam Vitals and nursing note reviewed.  Constitutional:      Appearance: He is not ill-appearing.     Comments: Uncomfortable appearing male  HENT:     Head: Normocephalic and atraumatic.  Eyes:     Conjunctiva/sclera: Conjunctivae normal.  Cardiovascular:     Rate and Rhythm: Normal rate and regular rhythm.     Pulses: Normal pulses.  Pulmonary:     Effort: Pulmonary effort is normal.     Breath sounds: Normal breath sounds. No wheezing, rhonchi or rales.     Comments: Diffuse chest wall TTP Chest:     Chest wall: Tenderness present.  Abdominal:     Palpations: Abdomen is soft.     Tenderness: There is no abdominal tenderness.  Musculoskeletal:        General: Tenderness present.     Cervical back: Neck supple.     Comments: Diffuse musculoskeletal TTP. Moving all extremities without difficulty. No swelling noted to joints; no erythema or increased warmth to joints.   Skin:    General: Skin is warm and dry.  Neurological:     Mental Status: He is alert.     ED Results / Procedures / Treatments   Labs (all labs ordered are listed, but only abnormal results are displayed) Labs Reviewed  CBC WITH DIFFERENTIAL/PLATELET - Abnormal; Notable for the following components:       Result Value   WBC 18.6 (*)    RBC 2.64 (*)    Hemoglobin 9.5 (*)    HCT 26.7 (*)    MCV 101.1 (*)    MCH 36.0 (*)    RDW 23.1 (*)    Platelets 402 (*)    nRBC 12.4 (*)    Neutro Abs 11.8 (*)    Lymphs Abs 4.7 (*)    Monocytes Absolute 1.2 (*)    Abs Immature Granulocytes 0.51 (*)    All other components within normal limits  RETICULOCYTES - Abnormal; Notable for the following components:   Retic Ct Pct 16.4 (*)    RBC.  2.71 (*)    Retic Count, Absolute 392.5 (*)    Immature Retic Fract 35.8 (*)    All other components within normal limits  BASIC METABOLIC PANEL    EKG None  Radiology DG Chest 2 View  Result Date: 12/14/2020 CLINICAL DATA:  Chest pain. EXAM: CHEST - 2 VIEW COMPARISON:  November 12, 2018. FINDINGS: The heart size and mediastinal contours are within normal limits. Both lungs are clear. No pneumothorax or pleural effusion is noted. The visualized skeletal structures are unremarkable. IMPRESSION: No active cardiopulmonary disease. Electronically Signed   By: Marijo Conception M.D.   On: 12/14/2020 11:54    Procedures Procedures   Medications Ordered in ED Medications  oxyCODONE (Oxy IR/ROXICODONE) immediate release tablet 15 mg (has no administration in time range)  ketorolac (TORADOL) 15 MG/ML injection 15 mg (15 mg Intravenous Given 12/14/20 1241)  HYDROmorphone (DILAUDID) injection 1 mg (1 mg Intravenous Given 12/14/20 1237)  HYDROmorphone (DILAUDID) injection 0.5 mg (0.5 mg Intravenous Given 12/14/20 1315)    ED Course  I have reviewed the triage vital signs and the nursing notes.  Pertinent labs & imaging results that were available during my care of the patient were reviewed by me and considered in my medical decision making (see chart for details).  Clinical Course as of 12/14/20 1527  Fri Dec 14, 2020  1522 DG Chest 2 View [JK]    Clinical Course User Index [JK] Kugler, [place], MD   MDM Rules/Calculators/A&P                           25 year old male with a history of sickle cell who presents to the ED today complaining of crises including chest pain and diffuse body pain.  Has been present for 2 years and recently was discharged.  Does not have any home medications.  On arrival to the ED patient's temperature is slightly elevated 99.1.  He is nontachycardic.  He is mildly tachypneic.  He denies any shortness of breath or cough.  Typically does have chest pain with his crises.  He is satting 96% on room air.  We will plan for sickle cell crises work-up at this time including lab work as well as EKG and chest x-ray.  Lower suspicion for acute chest syndrome at this time given no complaints of cough or shortness of breath.  Will provide pain medication and reevaluate.  Will place transition of care consult this patient has been present for 2 years and does not have a PCP to follow-up with currently.   CBC with a stable hgb for patient at 9.5. WBC elevated at 18.6 however doubt infection today given CXR clear.  BMP without electrolyte abnormalities.  Reticulocytes RBC 2.71  Case management has seen patient. It appears he had an appointment scheduled with [place] Hollis on 03/29 that he unfortunately missed. Her most recent appointment isn't until May however pt can be seen by another provider as early as 04/13. He will need to follow up to establish care again and to be started on pain medication at home.   On reevaluation pt reports he is still in pain. Will provide oral dose of pain medication and if his pain is still uncontrolled he may require admission.   At shift change case signed out to oncoming provider Dr. [place] Kugler Resident working with attending physician Dr. Sherry Ruffing who agrees to dispo patient accordingly. Short course of pain meds have been sent for patient.  This note was prepared using Dragon voice recognition software and may include unintentional dictation errors due to the inherent limitations of voice  recognition software.   Final Clinical Impression(s) / ED Diagnoses Final diagnoses:  Sickle cell crisis ([place])    Rx / DC Orders ED Discharge Orders         Ordered    oxyCODONE-acetaminophen (PERCOCET/ROXICET) 5-325 MG tablet  Every 6 hours PRN        12/14/20 1524           Discharge Instructions     Please follow up with [facility] as scheduled on 04/13 at 9:20 AM.   Pick up pain medication and take as needed for pain.   Return to the ED for any worsening symptoms. You can also follow up at the [facility].        Eustaquio Maize, PA-C 12/14/20 1527    Margette Fast, MD 12/18/20 1012

## 2020-12-14 NOTE — H&P (Addendum)
H&P  Patient Demographics:  Jeffrey Morrison, is a 25 y.o. male  MRN: [id number]   DOB - 20-Apr-1996  Admit Date - 12/14/2020  Outpatient Primary MD for the patient is Jeffrey Jun, FNP      HPI:   Jeffrey Morrison  is a 25 y.o. male with a medical history significant for sickle cell disease presented to the emergency department with complaints of generalized pain that is consistent with his previous sickle cell pain crisis.  Patient was recently incarcerated and has not establish care with a PCP.  He has no pain medications at home to manage sickle cell pain crisis.  He says that he was in his usual state of health until about halfway through his shift at work.  He says that pain started suddenly and he characterizes it is sharp and "all over".  Patient rates pain as 10/10.  He endorses some chest pain.  He denies headache, shortness of breath, dizziness, or paresthesias.  No headache, urinary frequency, urinary urgency, nausea, vomiting, or diarrhea.  No sick contacts, recent travel, or exposure to COVID-19.  ER course: Vital signs recorded as:BP 107/87   Pulse (!) 30   Temp 99.1 F (37.3 C)   Resp (!) 22   SpO2 98%  Complete blood count shows WBCs 18.6, hemoglobin 9.5, and platelets 402,000.  Basic metabolic panel unremarkable.  COVID-19 pending.  Rapid drug screen pending.  Chest x-ray shows no active cardiopulmonary disease.  Patient's pain persists despite IV Dilaudid, IV Toradol, and IV fluids.  Sickle cell team notified for admission.  Patient admitted for further management of sickle cell pain crisis.   Review of systems:   Review of Systems  Constitutional: Negative for chills and fever.  HENT: Negative.   Eyes: Negative.   Respiratory: Negative.   Cardiovascular: Negative.   Gastrointestinal: Negative.   Genitourinary: Negative.   Musculoskeletal: Positive for back pain and joint pain.  Skin: Negative.   Neurological: Negative.   Psychiatric/Behavioral: The patient is  nervous/anxious.     With Past History of the following :   Past Medical History:  Diagnosis Date  . Chronic lower back pain   . Depression   . Sickle cell crisis ([place])   . Sickle cell disease ([place])       Past Surgical History:  Procedure Laterality Date  . SPLENECTOMY       Social History:   Social History   Tobacco Use  . Smoking status: Current Every Day Smoker    Packs/day: 0.50    Types: Cigarettes  . Smokeless tobacco: Never Used  Substance Use Topics  . Alcohol use: No     Lives - At home   Family History :   Family History  Problem Relation Age of Onset  . Diabetes Mother      Home Medications:   Prior to Admission medications   Medication Sig Start Date End Date Taking? Authorizing Provider  hydroxyurea (HYDREA) 500 MG capsule Take 1 capsule (500 mg total) by mouth 2 (two) times daily. May take with food to minimize GI side effects. 07/16/18  Yes Jeffrey Dew, FNP  oxyCODONE-acetaminophen (PERCOCET/ROXICET) 5-325 MG tablet Take 1 tablet by mouth every 6 (six) hours as needed for severe pain. 12/14/20  Yes Venter, Margaux, PA-C  acetaminophen (TYLENOL) 325 MG tablet Take 2 tablets (650 mg total) by mouth every 6 (six) hours as needed for headache. Patient not taking: Reported on 12/14/2020 07/16/18   Jeffrey Dew, FNP  buPROPion (WELLBUTRIN XL) 150 MG 24 hr tablet Take 1 tablet (150 mg total) by mouth daily. Patient not taking: Reported on 12/14/2020 07/16/18   Jeffrey Dew, FNP  citalopram (CELEXA) 40 MG tablet Take 1 tablet (40 mg total) by mouth daily. Patient not taking: Reported on 12/14/2020 07/16/18   Jeffrey Dew, FNP  elvitegravir-cobicistat-emtricitabine-tenofovir (GENVOYA) 150-150-200-10 MG TABS tablet Take 1 tablet by mouth daily with breakfast. Patient not taking: Reported on 12/14/2020 10/05/18   Jeffrey Morrison, Jeffrey N, PA-C  folic acid (FOLVITE) 1 MG tablet Take 1 tablet (1 mg total) by mouth daily. Patient not taking: Reported on  12/14/2020 07/16/18   Jeffrey Dew, FNP  gabapentin (NEURONTIN) 300 MG capsule Take 1 capsule (300 mg total) by mouth 3 (three) times daily. Patient not taking: No sig reported 11/16/18   [name], MD  ibuprofen (ADVIL,MOTRIN) 800 MG tablet Take 1 tablet (800 mg total) by mouth every 6 (six) hours as needed (sickle aching cell pain and inflammation). Patient not taking: No sig reported 11/16/18   [name], MD  Menthol-Methyl Salicylate (MUSCLE RUB) 10-15 % CREA Apply 1 application topically as needed for muscle pain. Patient not taking: No sig reported 11/16/18   [name], MD     Allergies:   No Known Allergies   Physical Exam:   Vitals:   Vitals:   12/14/20 1645 12/14/20 1650  BP: 119/62   Pulse: 73 73  Resp: 20   Temp:    SpO2: 91% 91%    Physical Exam: Constitutional: Patient appears well-developed and well-nourished. Patient in moderate distress, writhing in pain HENT: Normocephalic, atraumatic, External right and left ear normal. Oropharynx is clear and moist.  Eyes: Conjunctivae and EOM are normal. PERRLA, no scleral icterus. Neck: Normal ROM. Neck supple. No JVD. No tracheal deviation. No thyromegaly. CVS: RRR, S1/S2 +, no murmurs, no gallops, no carotid bruit.  Pulmonary: Effort and breath sounds normal, no stridor, rhonchi, wheezes, rales.  Abdominal: Soft. BS +, no distension, tenderness, rebound or guarding.  Musculoskeletal: Normal range of motion. No edema and no tenderness.  Lymphadenopathy: No lymphadenopathy noted, cervical, inguinal or axillary Neuro: Alert. Normal reflexes, muscle tone coordination. No cranial nerve deficit. Skin: Skin is warm and dry. No rash noted. Not diaphoretic. No erythema. No pallor. Psychiatric: Normal mood and affect. Behavior, judgment, thought content normal.   Data Review:   CBC Recent Labs  Lab 12/14/20 1125  WBC 18.6*  HGB 9.5*  HCT 26.7*  PLT 402*  MCV 101.1*  MCH 36.0*  MCHC 35.6   RDW 23.1*  LYMPHSABS 4.7*  MONOABS 1.2*  EOSABS 0.3  BASOSABS 0.1   ------------------------------------------------------------------------------------------------------------------  Chemistries  Recent Labs  Lab 12/14/20 1125  NA 137  K 4.5  CL 105  CO2 24  GLUCOSE 91  BUN 12  CREATININE 0.95  CALCIUM 9.5   ------------------------------------------------------------------------------------------------------------------ CrCl cannot be calculated (Unknown ideal weight.). ------------------------------------------------------------------------------------------------------------------ No results for input(s): TSH, T4TOTAL, T3FREE, THYROIDAB in the last 72 hours.  Invalid input(s): FREET3  Coagulation profile No results for input(s): INR, PROTIME in the last 168 hours. ------------------------------------------------------------------------------------------------------------------- No results for input(s): DDIMER in the last 72 hours. -------------------------------------------------------------------------------------------------------------------  Cardiac Enzymes No results for input(s): CKMB, TROPONINI, MYOGLOBIN in the last 168 hours.  Invalid input(s): CK ------------------------------------------------------------------------------------------------------------------ No results found for: BNP  ---------------------------------------------------------------------------------------------------------------  Urinalysis    Component Value Date/Time   COLORURINE AMBER (A) 11/12/2018 [place] 11/12/2018 1549   LABSPEC 1.019 11/12/2018  [place] 6.0 11/12/2018 1549   GLUCOSEU NEGATIVE 11/12/2018 1549   HGBUR NEGATIVE 11/12/2018 1549   [place] 11/12/2018 1549   KETONESUR NEGATIVE 11/12/2018 1549   PROTEINUR 30 (A) 11/12/2018 1549   UROBILINOGEN 1.0 10/02/2017 1116   NITRITE NEGATIVE 11/12/2018 1549   LEUKOCYTESUR NEGATIVE  11/12/2018 1549    ----------------------------------------------------------------------------------------------------------------   Imaging Results:    DG Chest 2 View  Result Date: 12/14/2020 CLINICAL DATA:  Chest pain. EXAM: CHEST - 2 VIEW COMPARISON:  November 12, 2018. FINDINGS: The heart size and mediastinal contours are within normal limits. Both lungs are clear. No pneumothorax or pleural effusion is noted. The visualized skeletal structures are unremarkable. IMPRESSION: No active cardiopulmonary disease. Electronically Signed   By: Marijo Conception M.D.   On: 12/14/2020 11:54     Assessment & Plan:  Principal Problem:   Sickle cell pain crisis ([place]) Active Problems:   Anemia of chronic disease   Depression   Tobacco abuse  Sickle cell disease with pain crisis: Admit patient.  Start IV fluids, 0.45% saline at 125 mL/h Toradol 15 mg IV every 6 hours for total of 5 days Initiate IV Dilaudid PCA, full dose.  Patient is opiate nave Oxycodone 10 mg every 4 hours as needed for severe breakthrough pain Monitor vital signs very closely, reevaluate pain scale regularly, and supplemental oxygen as needed.  Leukocytosis: WBCs 18.5.  More than likely secondary to vaso-occlusive pain crisis.  Chest x-ray shows no acute cardiopulmonary process.  Sickle cell anemia: Patient's hemoglobin is 9.5, appear to be consistent with his baseline.  There is no clinical indication for blood transfusion at this time. Patient was incarcerated at [facility] and was [facility] by [facility]. He was on hydroxyurea at that time, but has been without medications. Will restart both hydroxyurea 353 mg daily and folic acid 1 mg daily  History of depression and anxiety: Patient was recently incarcerated and has been without antidepressants for quite some time.  Will restart Wellbutrin and Celexa.  Patient denies any suicidal or homicidal ideations  Tobacco abuse: Smoking cessation  instruction/counseling given:  counseled patient on the dangers of tobacco use, advised patient to stop smoking, and reviewed strategies to maximize success   History of polysubstance:  Denies any current drug use. U[place] drug screen pending.    DVT Prophylaxis: Subcut Lovenox and SCDs  AM Labs Ordered, also please review Full Orders  Family Communication: Admission, patient's condition and plan of care including tests being ordered have been discussed with the patient who indicate understanding and agree with the plan and Code Status.  Code Status: Full Code  Consults called: None    Admission status: Inpatient    Time spent in minutes : 50 minutes [place], MSN, FNP-C [facility] [address] [place], [place] [address] [phone]   12/14/2020 at 5:00 PM

## 2020-12-14 NOTE — Discharge Instructions (Addendum)
Footstrike Hemolysis Footstrike hemolysis happens when red blood cells in the foot break down faster than usual. This is usually due to repeated contact between the foot and the ground. This condition often happens to long-distance runners. It can also happen to other athletes, including dancers and hikers. Footstrike hemolysis may also be called march hemoglobinuria. This condition may cause part of the red blood cells (hemoglobin) to appear in the urine, making the urine red (hematuria). In most cases, the body can make enough new red blood cells to replace the damaged cells. If the body cannot replace red blood cells quickly enough, the blood will not carry enough oxygen throughout the body (anemia). This can cause fatigue and weakness. What are the causes? Footstrike hemolysis is caused by one or both of the following factors:  Repeated force to the bottom of the foot.  Increased pressure in the foot muscles. What increases the risk? You may be more likely to develop this condition if you:  Run long distances.  Run on hard surfaces.  Wear shoes that are not supportive or are worn out.  Lack iron in your diet.  Are overweight.  Exercise for a long time.  Exercise at a high intensity.  Exercise in high temperatures. What are the signs or symptoms? In some cases, footstrike hemolysis may not cause any symptoms. If you do have symptoms, they may include:  Fatigue and weakness.  Shortness of breath and dizziness.  Rapid heartbeat.  Hematuria.  Reduced athletic performance or endurance. Endurance is the ability to use your muscles for a long time, even after they get tired. How is this diagnosed? This condition may be diagnosed based on your symptoms, your medical history, and a physical exam. You may also have blood tests done. How is this treated? This condition may be treated by modifying your physical activity in order to place less stress on your feet. Your health care  provider may recommend that you take iron supplements. If you are overweight, your health care provider may recommend that you lose weight. Follow these instructions at home: Activity  Do not make sudden increases in the frequency or intensity of your physical activity.  Wear supportive shoes that are in good condition. Use shock-absorbing insoles to reduce the impact on your feet.  When possible, do physical activity on soft surfaces and in cool temperatures.  Return to your normal activities as told by your health care provider. Ask your health care provider what activities are safe for you.   General instructions  Take over-the-counter and prescription medicines only as told by your health care provider.  Take supplements only as recommended by your health care provider.  If you had blood tests done, it is up to you to get your test results. Ask your health care provider, or the department that is doing the test, when your results will be ready.  Maintain a healthy weight.  Keep all follow-up visits as told by your health care provider. This is important.   How is this prevented?  Wear comfortable and supportive footwear when participating in physical activity.  Give your body time to rest between periods of activity. Contact a health care provider if:  You have unexplained fatigue that interferes with your ability to exercise or to do your normal activities.  Your urine is red or bloody.  Your symptoms get worse. Summary  Footstrike hemolysis happens when red blood cells in the foot break down faster than usual. This is usually due to  repeated contact between the foot and the ground.  This condition often happens to long-distance runners, but it can also affect other athletes, such as dancers or hikers.  Footstrike hemolysis may be treated by reducing physical activity, taking iron supplements, and losing weight if necessary.  Make sure you wear supportive shoes that are  in good condition. Use shock-absorbing insoles to reduce the impact on your feet. This information is not intended to replace advice given to you by your health care provider. Make sure you discuss any questions you have with your health care provider. Document Revised: 12/23/2018 Document Reviewed: 08/09/2018 Elsevier Patient Education  2021 [place].   Sickle Cell Anemia, Adult  Sickle cell anemia is a condition where your red blood cells are shaped like sickles. Red blood cells carry oxygen through the body. Sickle-shaped cells do not live as long as normal red blood cells. They also clump together and block blood from flowing through the blood vessels. This prevents the body from getting enough oxygen. Sickle cell anemia causes organ damage and pain. It also increases the risk of infection. Follow these instructions at home: Medicines  Take over-the-counter and prescription medicines only as told by your doctor.  If you were prescribed an antibiotic medicine, take it as told by your doctor. Do not stop taking the antibiotic even if you start to feel better.  If you develop a fever, do not take medicines to lower the fever right away. Tell your doctor about the fever. Managing pain, stiffness, and swelling  Try these methods to help with pain: ? Use a heating pad. ? Take a warm bath. ? Distract yourself, such as by watching TV. Eating and drinking  Drink enough fluid to keep your pee (urine) clear or pale yellow. Drink more in hot weather and during exercise.  Limit or avoid alcohol.  Eat a healthy diet. Eat plenty of fruits, vegetables, whole grains, and lean protein.  Take vitamins and supplements as told by your doctor. Traveling  When traveling, keep these with you: ? Your medical information. ? The names of your doctors. ? Your medicines.  If you need to take an airplane, talk to your doctor first. Activity  Rest often.  Avoid exercises that make your heart  beat much faster, such as jogging. General instructions  Do not use products that have nicotine or tobacco, such as cigarettes and e-cigarettes. If you need help quitting, ask your doctor.  Consider wearing a medical alert bracelet.  Avoid being in high places (high altitudes), such as mountains.  Avoid very hot or cold temperatures.  Avoid places where the temperature changes a lot.  Keep all follow-up visits as told by your doctor. This is important. Contact a doctor if:  A joint hurts.  Your feet or hands hurt or swell.  You feel tired (fatigued). Get help right away if:  You have symptoms of infection. These include: ? Fever. ? Chills. ? Being very tired. ? Irritability. ? Poor eating. ? Throwing up (vomiting).  You feel dizzy or faint.  You have new stomach pain, especially on the left side.  You have a an erection (priapism) that lasts more than 4 hours.  You have numbness in your arms or legs.  You have a hard time moving your arms or legs.  You have trouble talking.  You have pain that does not go away when you take medicine.  You are short of breath.  You are breathing fast.  You have a  long-term cough.  You have pain in your chest.  You have a bad headache.  You have a stiff neck.  Your stomach looks bloated even though you did not eat much.  Your skin is pale.  You suddenly cannot see well. Summary  Sickle cell anemia is a condition where your red blood cells are shaped like sickles.  Follow your doctor's advice on ways to manage pain, food to eat, activities to do, and steps to take for safe travel.  Get medical help right away if you have any signs of infection, such as a fever. This information is not intended to replace advice given to you by your health care provider. Make sure you discuss any questions you have with your health care provider. Document Revised: 01/26/2020 Document Reviewed: 01/26/2020 Elsevier Patient Education   2021 [place].   Sickle Cell Testing Why am I having this test? Sickle cell testing is used to determine if you have:  Sickle cell anemia. This is a condition in which red blood cells have an abnormal "sickle" shape. Red blood cells carry oxygen through the body. Sickle-shaped red blood cells do not live as long as normal red blood cells. They also clump together and block blood from flowing through the blood vessels.  The sickle cell trait. Sickle cell anemia is caused by a gene that is passed from parent to child (inherited). Having two copies of the gene causes the disease. Having one copy causes the "trait," which means that you may have no symptoms or milder symptoms but could pass the trait to a child. Newborns have sickle cell testing as part of routine testing that is done at the hospital after birth (newborn metabolic screening). You may have this test if you have symptoms of sickle cell disease and:  Your ancestors were from [place], the [place], [place] or [place], the [place], [place], or the [place]. This raises the risk for sickle cell anemia.  You had abnormal results from a different blood test, such as a complete blood count (CBC) test. What is being tested? This test checks the blood for the presence of hemoglobin S (HbS). Hemoglobin is a protein in red blood cells that binds to oxygen, so that oxygen can be delivered throughout the body. HbS is an abnormal form of hemoglobin associated with sickle cell disease. What kind of sample is taken? A blood sample is required for this test. It is usually collected by inserting a needle into a blood vessel.   How do I prepare for this test? Follow instructions from your health care provider about changing or stopping your regular medicines before the test. How are the results reported? Your test results will be reported as either positive or negative. Positive means that there is HbS in your blood. Negative means  that there is no HbS in your blood. What do the results mean? A negative test result means that you most likely do not have sickle cell trait or sickle cell disease. A positive test result means that you have either the sickle cell trait or sickle cell disease. Talk with your health care provider about what your results mean. Questions to ask your health care provider Ask your health care provider, or the department that is doing the test:  When will my results be ready?  How will I get my results?  What are my treatment options?  What other tests do I need?  What are my next steps? Summary  Sickle cell testing  is used to determine if you have sickle cell trait or sickle cell disease.  Sickle cell anemia is caused by a gene that is passed from parent to child (inherited). Having two copies of the gene causes the disease. Having one copy causes the "trait," which means that you may have no symptoms or milder symptoms but could pass the trait to a child.  A negative result means that you most likely do not have sickle cell trait or sickle cell disease.  A positive result means that you have either the sickle cell trait or sickle cell disease. This information is not intended to replace advice given to you by your health care provider. Make sure you discuss any questions you have with your health care provider. Document Revised: 01/16/2020 Document Reviewed: 01/16/2020 Elsevier Patient Education  2021 [place].   Hemolytic Anemia Anemia is a condition in which you do not have enough red blood cells to carry oxygen throughout your body. Hemolytic anemia happens when your red blood cells are destroyed faster than they are made. There are many types of hemolytic anemia, and each type is either inherited or acquired:  Inherited. These are due to a gene that your parents passed on to you. The abnormal cells may break down while moving through the body system that includes your heart,  blood, lymph fluid, and blood vessels (circulatory system). Your spleen may remove the abnormal blood cells and traces of destroyed blood cells (debris) from your bloodstream.  Acquired. These happen when your red blood cells are destroyed either by certain medicines that you have used or as a result of infections or medical conditions that you have. What are the causes? The destruction of red blood cells in your body causes this condition. Sometimes the cause is not known. Known causes include:  Inherited disorders, such as sickle cell anemia and hemoglobin disorder (thalassemia).  Use of certain medicines.  Blood infection (septicemia).  Exposure to poisonous (toxic) chemicals or excessive radiation.  Reactions to blood transfusions.  Certain immune disorders.  Artificial heart valves.  Enlarged spleen.  Hemodialysis. This is a treatment for kidney failure.  Cancer. What are the signs or symptoms? Symptoms of this condition include:  Pale skin, eyes, and fingernails.  Irregular or fast heartbeat.  Headaches.  Tiredness (fatigue) and weakness.  Dizziness or fainting.  Shortness of breath.  Yellowing of the skin or the white parts of the eyes (jaundice).  Chest pain.  Cold hands and feet.  Problems with memory and thinking. How is this diagnosed? This condition is diagnosed based on a physical exam and your symptoms. You may also have tests, including:  Blood tests. You may have a complete blood count (CBC).  Urine tests.  Bone marrow tissue exam (biopsy).   How is this treated? Treatment depends on the cause of your condition. Treatment may include:  Medicines.  Blood transfusions.  Purification of blood to remove antibodies (plasmapheresis).  Blood and bone marrow stem cell transplant.  Surgery to remove the spleen.  Diet changes and supplements. You may need to take folic acid and iron. Follow these instructions at home:  Take over-the-counter  and prescription medicines only as told by your health care provider. These include any iron or other supplements.  If you were prescribed an antibiotic medicine, take it as told by your health care provider. Do not stop taking the antibiotic even if you start to feel better.  Prevent infection and lower your risk of getting sick by: ?  Avoiding people who are sick. ? Getting a flu shot and pneumonia shot, if told by your health care provider. ? Not eating or drinking foods that are more likely to have bacteria in them, such as raw or uncooked fish or meat. ? Washing your hands often with soap and water. If soap and water are not available, use hand sanitizer.  Keep all follow-up visits as told by your health care provider. This is important. Contact a health care provider if:  You become dizzy or tired easily.  Your skin looks pale.  You feel your heart beating faster than normal.  You feel like your heart has skipped beats or stopped beating (irregular heartbeat). Get help right away if:  Your skin and the white parts of your eyes turn yellow.  You develop chest pain.  You become short of breath.  You faint.  You develop an uncontrolled cough. Summary  Anemia is a condition in which you do not have enough red blood cells to carry oxygen throughout your body.  Hemolytic anemia happens when your red blood cells are destroyed faster than they are made.  Treatment depends on the cause of your condition.  Take actions to prevent infection and lower your risk of getting sick, such as avoiding people who are sick, getting shots as directed, avoiding raw or uncooked foods, and washing your hands often with soap and water. This information is not intended to replace advice given to you by your health care provider. Make sure you discuss any questions you have with your health care provider. Document Revised: 01/23/2020 Document Reviewed: 01/23/2020 Elsevier Patient Education  2021  [place] Should Know About Sickle Cell Trait What is sickle cell trait? Sickle cell trait (SCT) is not a mild form of sickle cell disease. Having SCT simply means that a person carries a single gene for sickle cell disease (SCD) and can pass this gene along to their children. People with SCT usually do not have any of the symptoms of SCD and live a normal life. Hemoglobin is found in red blood cells and it gives blood its color. It carries oxygen to all parts of the body. Hemoglobin is made from two similar proteins, one called alpha-globin and one called beta-globin, that "stick together." Both proteins must be present and function normally for the hemoglobin to carry out its job in the body. People with SCT have red blood cells that have normal hemoglobin and abnormal hemoglobin. Genes are the instructions that control how red blood cells make alpha- and beta-globin proteins. All people have two genes for making beta-globin. They get one beta-globin gene from each parent. SCT occurs when a person inherits a gene for sickle beta-globin from one parent and a gene for normal beta-globin from the other parent. This means the person won't have sickle cell disease, but will be a trait "carrier" and can pass it on to their children. What is sickle cell disease? SCD is a genetic condition that is present at birth. In SCD, the red blood cells become hard and sticky and look like a C-shaped farm tool called a "sickle." The sickle cells die early, which causes a constant shortage of red blood cells. Also, when they travel through small blood vessels, they get stuck and clog the blood flow. This can cause pain and other serious problems. It is inherited when a child receives two sickle beta-globin genes--one from each parent. Therefore, a child can only have SCD when both of  his/her parents have at least one abnormal beta-globin gene. What are the chances that a baby will have sickle cell trait or  sickle cell disease? The most important thing to know about having SCT is that you could have a baby with SCD if your partner also has an abnormal hemoglobin gene. Who is affected by sickle cell trait? SCT is more common among people whose ancestors come from [place], the [place] region, [place], and [place], but anyone can have SCT.  1 in 12 blacks or African Americans in the [place] has SCT. If both parents have SCT, each child that they have together has a  1 in 2 (50%) chance of having SCT. Children with SCT will not have symptoms of SCD, but they can pass SCT on to their children.  1 in 4 (25%) chance of having sickle cell anemia, one of several types of SCD. Sickle cell anemia is a serious medical condition.  1 in 4 (25%) chance that they will not have SCD or SCT. If one parent has SCT and the other parent has another abnormal hemoglobin gene (like hemoglobin C trait or beta-thalassemia trait), each of their children has a  1 in 2 (50%) chance of having SCT.  1 in 4 (25%) chance of having SCD (not sickle cell anemia). These other types of SCD can be more or less severe depending on the specific abnormal hemoglobin gene.  1 in 4 (25%) chance that they will not have SCD or SCT. If only one parent has SCT, each of their children has a  1 in 2 (50%) chance of having SCT.  1 in 2 (50%) chance that they will not have SCT. What health problems might occur in people with sickle cell trait? Most people with SCT do not have any health problems caused by sickle cell trait. However, there are a few, rare health problems that may potentially be related to SCT. For example, if people with SCT have pain when traveling to or exercising at high altitudes, they should tell their healthcare provider. People with SCT and eye trauma should seek out medical attention and inform the physician about the trait status. People with SCT should drink plenty of water during exercise. People with  SCT should contact and inform their doctor if they notice blood in their urine. To find out more about SCT and to get specific answers to your questions, call your healthcare provider. How will a person know if he or she has sickle cell trait? To find out if you have SCT, your doctor needs to order a blood test. If you find out you and/or your loved one has SCT, talk to your healthcare provider and/or a genetic counselor about what that means. It is important that you know what SCT is and how it can affect you and your family. For more information visit: [email or web address] U.S. Department of Health and Geneticist, molecular for Disease Control and Prevention American Society of Hematology SCDAA "Break The Sickle Cycle" This information is not intended to replace advice given to you by your health care provider. Make sure you discuss any questions you have with your health care provider. Document Revised: 06/05/2020 Document Reviewed: 06/05/2020 Elsevier Patient Education  2021 [employer].

## 2020-12-14 NOTE — ED Triage Notes (Signed)
Pt here with c/o chest and back pain , pt has this pain usually with his sickle cell

## 2020-12-14 NOTE — ED Provider Notes (Signed)
Patient is a 25 year old male who presented with chest pain.  Patient has a history of sickle cell anemia.  This is his typical sickle cell pain.  He has been provided with IV pain medications.  Patient does not have any infiltration or signs of acute drop on his chest x-ray.  He is hemodynamically stable.  His pain is somewhat under control with IV pain medications.  We are now trying to transition him to p.o. pain medications.  Plan is to reevaluate after he gets his last dose of pain medication.   Physical Exam  BP 134/78   Pulse 83   Temp 99.1 F (37.3 C)   Resp 18   SpO2 100%   Physical Exam Vitals and nursing note reviewed.  Constitutional:      Appearance: He is well-developed. He is ill-appearing.  HENT:     Head: Normocephalic and atraumatic.     Right Ear: External ear normal.     Left Ear: External ear normal.     Nose: Nose normal.     Mouth/Throat:     Mouth: Mucous membranes are moist.     Pharynx: Oropharynx is clear.  Eyes:     General:        Right eye: No discharge.        Left eye: No discharge.     Conjunctiva/sclera: Conjunctivae normal.  Cardiovascular:     Rate and Rhythm: Normal rate and regular rhythm.  Pulmonary:     Effort: Pulmonary effort is normal.     Breath sounds: Normal breath sounds.  Abdominal:     Palpations: Abdomen is soft.  Musculoskeletal:     Cervical back: Neck supple.  Skin:    General: Skin is warm and dry.  Neurological:     General: No focal deficit present.     Mental Status: He is alert and oriented to person, place, and time.  Psychiatric:        Mood and Affect: Mood normal.        Behavior: Behavior normal.    ED Course/Procedures   Clinical Course as of 12/14/20 1534  Fri Dec 14, 2020  [place] [facility] View [JK]    Clinical Course User Index [JK] Whitten Andreoni, [place], MD    Procedures  MDM  On reevaluation, patient still reports significant amount of pain.  Will redose him with 1 mg of Dilaudid IV pain  medication now.  Will reach out the sickle cell team for admission for sickle cell pain crisis.  I am concerned about patient's discharge as he does not have established follow-up with a known sickle cell provider and does not have any prescribed pain medication at home. Feel that he needs better pain control and pain plan.  After discussion, patient will be transferred to [facility] for admission for sickle cell crisis. Pain controlled until able to transfer to [facility] for admission.    Eesa Justiss, [place], MD 12/14/20 2159    Tegeler, Gwenyth Allegra, MD 12/14/20 2253

## 2020-12-15 DIAGNOSIS — F321 Major depressive disorder, single episode, moderate: Secondary | ICD-10-CM | POA: Diagnosis not present

## 2020-12-15 DIAGNOSIS — F1910 Other psychoactive substance abuse, uncomplicated: Secondary | ICD-10-CM

## 2020-12-15 DIAGNOSIS — D5700 Hb-SS disease with crisis, unspecified: Principal | ICD-10-CM

## 2020-12-15 DIAGNOSIS — D638 Anemia in other chronic diseases classified elsewhere: Secondary | ICD-10-CM

## 2020-12-15 DIAGNOSIS — Z720 Tobacco use: Secondary | ICD-10-CM

## 2020-12-15 LAB — CBC
HCT: 22.2 % — ABNORMAL LOW (ref 39.0–52.0)
Hemoglobin: 8 g/dL — ABNORMAL LOW (ref 13.0–17.0)
MCH: 35.2 pg — ABNORMAL HIGH (ref 26.0–34.0)
MCHC: 36 g/dL (ref 30.0–36.0)
MCV: 97.8 fL (ref 80.0–100.0)
Platelets: 297 10*3/uL (ref 150–400)
RBC: 2.27 MIL/uL — ABNORMAL LOW (ref 4.22–5.81)
RDW: 22.7 % — ABNORMAL HIGH (ref 11.5–15.5)
WBC: 21.3 10*3/uL — ABNORMAL HIGH (ref 4.0–10.5)
nRBC: 20 % — ABNORMAL HIGH (ref 0.0–0.2)

## 2020-12-15 LAB — BASIC METABOLIC PANEL
Anion gap: 9 (ref 5–15)
BUN: 19 mg/dL (ref 6–20)
CO2: 23 mmol/L (ref 22–32)
Calcium: 9 mg/dL (ref 8.9–10.3)
Chloride: 107 mmol/L (ref 98–111)
Creatinine, Ser: 1.11 mg/dL (ref 0.61–1.24)
GFR, Estimated: >60 mL/min (ref >60)
Glucose, Bld: 139 mg/dL — ABNORMAL HIGH (ref 70–99)
Potassium: 4.1 mmol/L (ref 3.5–5.1)
Sodium: 139 mmol/L (ref 135–145)

## 2020-12-15 LAB — HIV ANTIBODY (ROUTINE TESTING W REFLEX): HIV Screen 4th Generation wRfx: NONREACTIVE

## 2020-12-15 LAB — RAPID URINE DRUG SCREEN, HOSP PERFORMED
Amphetamines: NOT DETECTED
Barbiturates: NOT DETECTED
Benzodiazepines: NOT DETECTED
Cocaine: NOT DETECTED
Opiates: POSITIVE — AB
Tetrahydrocannabinol: NOT DETECTED

## 2020-12-15 MED ORDER — LIP MEDEX EX OINT
TOPICAL_OINTMENT | CUTANEOUS | Status: AC
  Filled 2020-12-15: qty 7

## 2020-12-15 MED ORDER — SODIUM CHLORIDE 0.9% FLUSH: 9.0000 mL | INTRAVENOUS | Status: DC | PRN

## 2020-12-15 MED ORDER — NALOXONE HCL 0.4 MG/ML IJ SOLN: 0.4000 mg | INTRAMUSCULAR | Status: DC | PRN

## 2020-12-15 MED ORDER — ONDANSETRON HCL 4 MG/2ML IJ SOLN: 4.0000 mg | Freq: Four times a day (QID) | INTRAMUSCULAR | Status: DC | PRN

## 2020-12-15 MED ORDER — HYDROMORPHONE 1 MG/ML IV SOLN
INTRAVENOUS | Status: DC
Start: 2020-12-15 — End: 2020-12-19
  Administered 2020-12-15: 2.4 mg via INTRAVENOUS
  Administered 2020-12-16: 1.5 mg via INTRAVENOUS
  Administered 2020-12-16: 4 mg via INTRAVENOUS
  Administered 2020-12-16: 0.5 mg via INTRAVENOUS
  Administered 2020-12-16: 1.5 mg via INTRAVENOUS
  Administered 2020-12-16: 3.5 mg via INTRAVENOUS
  Administered 2020-12-16: 2 mg via INTRAVENOUS
  Administered 2020-12-16: 30 mg via INTRAVENOUS
  Administered 2020-12-16: 4.5 mg via INTRAVENOUS
  Administered 2020-12-17: 5 mg via INTRAVENOUS
  Administered 2020-12-17: 3.5 mg via INTRAVENOUS
  Administered 2020-12-17: 5 mg via INTRAVENOUS
  Administered 2020-12-17: 4 mg via INTRAVENOUS
  Administered 2020-12-17: 2.5 mg via INTRAVENOUS
  Administered 2020-12-18: 2 mg via INTRAVENOUS
  Administered 2020-12-18: 3.5 mg via INTRAVENOUS
  Administered 2020-12-18: 1.5 mg via INTRAVENOUS
  Administered 2020-12-18: 30 mg via INTRAVENOUS
  Administered 2020-12-19: 7 mg via INTRAVENOUS
  Administered 2020-12-19: 2 mg via INTRAVENOUS
  Administered 2020-12-19: 30 mg via INTRAVENOUS
  Filled 2020-12-15 (×3): qty 30

## 2020-12-15 MED ORDER — DIPHENHYDRAMINE HCL 25 MG PO CAPS
25.0000 mg | ORAL_CAPSULE | ORAL | Status: DC | PRN
  Administered 2020-12-20: 25 mg via ORAL
  Filled 2020-12-15: qty 1

## 2020-12-15 MED ORDER — SODIUM CHLORIDE 0.9 % IV SOLN
25.0000 mg | INTRAVENOUS | Status: DC | PRN
  Filled 2020-12-15: qty 0.5

## 2020-12-15 NOTE — Progress Notes (Signed)
Subjective: Jeffrey Morrison, a 25 year old male with a medical history significant for sickle cell anemia, chronic pain syndrome, history of polysubstance abuse, was admitted in sickle cell pain crisis.   Today patient is still complaining of significant pain, generalized but mostly in his upper limbs and especially the left elbow joint.  There is no swelling or redness.  Patient has no fever.  He rated his pain at 10/10 today.  He characterized his pain as throbbing and achy.  He denies any shortness of breath, chest pain, cough, nausea, vomiting or diarrhea.  No urinary symptoms.  Objective:  Vital signs in last 24 hours:  Vitals:   12/15/20 0856 12/15/20 1200 12/15/20 1414 12/15/20 1654  BP: (!) 146/67  119/70   Pulse: 86  79   Resp: 16 20 15 16   Temp: 98.6 F (37 C)  98.1 F (36.7 C)   TempSrc: Oral  Oral   SpO2: 95% 97% (!) 88% 95%  Weight:      Height:        Intake/Output from previous day:   Intake/Output Summary (Last 24 hours) at 12/15/2020 1712 Last data filed at 12/15/2020 1530 Gross per 24 hour  Intake 1362.22 ml  Output 1400 ml  Net -37.78 ml    Physical Exam: General: Alert, awake, oriented x3, in no acute distress.  HEENT: [place]/AT PEERL, EOMI Neck: Trachea midline,  no masses, no thyromegal,y no JVD, no carotid bruit OROPHARYNX:  Moist, No exudate/ erythema/lesions.  Heart: Regular rate and rhythm, without murmurs, rubs, gallops, PMI non-displaced, no heaves or thrills on palpation.  Lungs: Clear to auscultation, no wheezing or rhonchi noted. No increased vocal fremitus resonant to percussion  Abdomen: Soft, nontender, nondistended, positive bowel sounds, no masses no hepatosplenomegaly noted..  Neuro: No focal neurological deficits noted cranial nerves II through XII grossly intact. DTRs 2+ bilaterally upper and lower extremities. Strength 5 out of 5 in bilateral upper and lower extremities. Musculoskeletal: No warm swelling or erythema around joints, no spinal  tenderness noted. Psychiatric: Patient alert and oriented x3, good insight and cognition, good recent to remote recall. Lymph node survey: No cervical axillary or inguinal lymphadenopathy noted.  Lab Results:  Basic Metabolic Panel:    Component Value Date/Time   NA 139 12/15/2020 0548   NA 140 10/02/2017 1126   K 4.1 12/15/2020 0548   CL 107 12/15/2020 0548   CO2 23 12/15/2020 0548   BUN 19 12/15/2020 0548   BUN 9 10/02/2017 1126   CREATININE 1.11 12/15/2020 0548   GLUCOSE 139 (H) 12/15/2020 0548   CALCIUM 9.0 12/15/2020 0548   CBC:    Component Value Date/Time   WBC 21.3 (H) 12/15/2020 0548   HGB 8.0 (L) 12/15/2020 0548   HGB 7.5 (L) 10/02/2017 1126   HCT 22.2 (L) 12/15/2020 0548   HCT 22.0 (L) 10/02/2017 1126   PLT 297 12/15/2020 0548   PLT 511 (H) 10/02/2017 1126   MCV 97.8 12/15/2020 0548   MCV 94 10/02/2017 1126   NEUTROABS 11.8 (H) 12/14/2020 1125   NEUTROABS 8.8 (H) 10/02/2017 1126   LYMPHSABS 4.7 (H) 12/14/2020 1125   LYMPHSABS 5.2 (H) 10/02/2017 1126   MONOABS 1.2 (H) 12/14/2020 1125   EOSABS 0.3 12/14/2020 1125   EOSABS 0.2 10/02/2017 1126   BASOSABS 0.1 12/14/2020 1125   BASOSABS 0.1 10/02/2017 1126    Recent Results (from the past 240 hour(s))  Resp Panel by RT-PCR (Flu A&B, Covid) Nasopharyngeal Swab     Status:  None   Collection Time: 12/14/20  6:35 PM   Specimen: Nasopharyngeal Swab; Nasopharyngeal(NP) swabs in vial transport medium  Result Value Ref Range Status   SARS Coronavirus 2 by RT PCR NEGATIVE NEGATIVE Final    Comment: (NOTE) SARS-CoV-2 target nucleic acids are NOT DETECTED.  The SARS-CoV-2 RNA is generally detectable in upper respiratory specimens during the acute phase of infection. The lowest concentration of SARS-CoV-2 viral copies this assay can detect is 138 copies/mL. A negative result does not preclude SARS-Cov-2 infection and should not be used as the sole basis for treatment or other patient management decisions. A negative  result may occur with  improper specimen collection/handling, submission of specimen other than nasopharyngeal swab, presence of viral mutation(s) within the areas targeted by this assay, and inadequate number of viral copies(<138 copies/mL). A negative result must be combined with clinical observations, patient history, and epidemiological information. The expected result is Negative.  Fact Sheet for Patients:  EntrepreneurPulse.com.au  Fact Sheet for Healthcare Providers:  IncredibleEmployment.be  This test is no t yet approved or cleared by the [place] FDA and  has been authorized for detection and/or diagnosis of SARS-CoV-2 by FDA under an Emergency Use Authorization (EUA). This EUA will remain  in effect (meaning this test can be used) for the duration of the COVID-19 declaration under Section 564(b)(1) of the Act, 21 U.S.C.section 360bbb-3(b)(1), unless the authorization is terminated  or revoked sooner.       Influenza A by PCR NEGATIVE NEGATIVE Final   Influenza B by PCR NEGATIVE NEGATIVE Final    Comment: (NOTE) The Xpert Xpress SARS-CoV-2/FLU/RSV plus assay is intended as an aid in the diagnosis of influenza from Nasopharyngeal swab specimens and should not be used as a sole basis for treatment. Nasal washings and aspirates are unacceptable for Xpert Xpress SARS-CoV-2/FLU/RSV testing.  Fact Sheet for Patients: EntrepreneurPulse.com.au  Fact Sheet for Healthcare Providers: IncredibleEmployment.be  This test is not yet approved or cleared by the [place] FDA and has been authorized for detection and/or diagnosis of SARS-CoV-2 by FDA under an Emergency Use Authorization (EUA). This EUA will remain in effect (meaning this test can be used) for the duration of the COVID-19 declaration under Section 564(b)(1) of the Act, 21 U.S.C. section 360bbb-3(b)(1), unless the authorization is  terminated or revoked.  Performed at [facility], [place] [address], [place], [place] [address]     Studies/Results: DG Chest 2 View  Result Date: 12/14/2020 CLINICAL DATA:  Chest pain. EXAM: CHEST - 2 VIEW COMPARISON:  November 12, 2018. FINDINGS: The heart size and mediastinal contours are within normal limits. Both lungs are clear. No pneumothorax or pleural effusion is noted. The visualized skeletal structures are unremarkable. IMPRESSION: No active cardiopulmonary disease. Electronically Signed   By: Marijo Conception M.D.   On: 12/14/2020 11:54    Medications: Scheduled Meds: . buPROPion  150 mg Oral Daily  . citalopram  40 mg Oral Daily  . enoxaparin (LOVENOX) injection  40 mg Subcutaneous Q24H  . folic acid  1 mg Oral Daily  . gabapentin  300 mg Oral TID  . HYDROmorphone   Intravenous Q4H  . ketorolac  15 mg Intravenous Q6H  . senna-docusate  1 tablet Oral BID   Continuous Infusions: . sodium chloride 125 mL/hr at 12/15/20 1501   PRN Meds:.diphenhydrAMINE, naloxone **AND** sodium chloride flush, ondansetron (ZOFRAN) IV, oxyCODONE, polyethylene glycol  Consultants:  None  Procedures:  None  Antibiotics:  None  Assessment/Plan: Principal  Problem:   Sickle cell pain crisis ([place]) Active Problems:   Anemia of chronic disease   Depression   Polysubstance abuse (HCC)   Tobacco abuse  1. Hb Sickle Cell Disease with crisis: Patient still appears dehydrated, we will continue IVF D5 .45% Saline @ 125 mls/hour, will reorder and increase the dose setting of weight based Dilaudid PCA at 0.5/10/3, continue IV Toradol 15 mg Q 6 H for total of 5 days, continue oral home pain medications as ordered.  Monitor vitals very closely, Re-evaluate pain scale regularly, 2 L of Oxygen by [place]. 2. Leukocytosis: Most likely reactive to sickle cell pain crisis.  There is no fever or other symptoms of systemic inflammation to warrant antibiotics.  We will continue to monitor very  closely. 3. Sickle Cell Anemia: Hemoglobin appears stable at baseline today.  There is no clinical indication for blood transfusion.  We will continue to monitor very closely, continue hydroxyurea and folic acid. 4. Chronic pain Syndrome: Restart and continue home pain medications. 5. Tobacco abuse: Laird was counseled on the dangers of tobacco use, and was advised to quit. Reviewed strategies to maximize success, including removing cigarettes and smoking materials from environment, stress management and support of family/friends. 6. History of depression and anxiety: Patient denied any suicidal ideations or thought. Continue Wellbutrin and Celexa  Code Status: Full Code Family Communication: N/A Disposition Plan: Not yet ready for discharge  Tionna Gigante  If 7PM-7AM, please contact night-coverage.  12/15/2020, 5:12 PM  LOS: 1 day Patient ID: Ellison Hughs, male   DOB: [birth year], 25 y.o.   MRN: [id number]

## 2020-12-16 DIAGNOSIS — D638 Anemia in other chronic diseases classified elsewhere: Secondary | ICD-10-CM | POA: Diagnosis not present

## 2020-12-16 DIAGNOSIS — D5700 Hb-SS disease with crisis, unspecified: Secondary | ICD-10-CM | POA: Diagnosis not present

## 2020-12-16 DIAGNOSIS — F321 Major depressive disorder, single episode, moderate: Secondary | ICD-10-CM | POA: Diagnosis not present

## 2020-12-16 DIAGNOSIS — F1910 Other psychoactive substance abuse, uncomplicated: Secondary | ICD-10-CM | POA: Diagnosis not present

## 2020-12-16 LAB — CBC
HCT: 19.7 % — ABNORMAL LOW (ref 39.0–52.0)
Hemoglobin: 7.1 g/dL — ABNORMAL LOW (ref 13.0–17.0)
MCH: 35.3 pg — ABNORMAL HIGH (ref 26.0–34.0)
MCHC: 36 g/dL (ref 30.0–36.0)
MCV: 98 fL (ref 80.0–100.0)
Platelets: 278 10*3/uL (ref 150–400)
RBC: 2.01 MIL/uL — ABNORMAL LOW (ref 4.22–5.81)
RDW: 22 % — ABNORMAL HIGH (ref 11.5–15.5)
WBC: 16.3 10*3/uL — ABNORMAL HIGH (ref 4.0–10.5)
nRBC: 23.8 % — ABNORMAL HIGH (ref 0.0–0.2)

## 2020-12-16 NOTE — Progress Notes (Signed)
Subjective: Jeffrey Morrison, a 25 year old male with a medical history significant for sickle cell anemia, chronic pain syndrome, history of polysubstance abuse, was admitted in sickle cell pain crisis.   Today patient appears much better than yesterday but still complaining of pain especially his left elbow joint, still no swelling or redness.  He rates his pain at 8/10.  He is more engaged during this encounter than yesterday.  He has no fever.  He denies any cough or chest pain, no dizziness or headache.  No nausea vomiting or diarrhea.  No urinary symptoms.  Objective:  Vital signs in last 24 hours:  Vitals:   12/16/20 1340 12/16/20 1636 12/16/20 1649 12/16/20 1717  BP: 139/67   130/62  Pulse: 85   95  Resp: 15 16 16 15   Temp: 100 F (37.8 C)   (!) 100.6 F (38.1 C)  TempSrc: Oral   Oral  SpO2: 96% 95% 95% 98%  Weight:      Height:       Intake/Output from previous day:   Intake/Output Summary (Last 24 hours) at 12/16/2020 1843 Last data filed at 12/16/2020 1810 Gross per 24 hour  Intake 2919.4 ml  Output 1400 ml  Net 1519.4 ml    Physical Exam: General: Alert, awake, oriented x3, in no acute distress.  HEENT: [place]/AT PEERL, EOMI Neck: Trachea midline,  no masses, no thyromegal,y no JVD, no carotid bruit OROPHARYNX:  Moist, No exudate/ erythema/lesions.  Heart: Regular rate and rhythm, without murmurs, rubs, gallops, PMI non-displaced, no heaves or thrills on palpation.  Lungs: Clear to auscultation, no wheezing or rhonchi noted. No increased vocal fremitus resonant to percussion  Abdomen: Soft, nontender, nondistended, positive bowel sounds, no masses no hepatosplenomegaly noted..  Neuro: No focal neurological deficits noted cranial nerves II through XII grossly intact. DTRs 2+ bilaterally upper and lower extremities. Strength 5 out of 5 in bilateral upper and lower extremities. Musculoskeletal: No warm swelling or erythema around joints, no spinal tenderness  noted. Psychiatric: Patient alert and oriented x3, good insight and cognition, good recent to remote recall. Lymph node survey: No cervical axillary or inguinal lymphadenopathy noted.  Lab Results:  Basic Metabolic Panel:    Component Value Date/Time   NA 139 12/15/2020 0548   NA 140 10/02/2017 1126   K 4.1 12/15/2020 0548   CL 107 12/15/2020 0548   CO2 23 12/15/2020 0548   BUN 19 12/15/2020 0548   BUN 9 10/02/2017 1126   CREATININE 1.11 12/15/2020 0548   GLUCOSE 139 (H) 12/15/2020 0548   CALCIUM 9.0 12/15/2020 0548   CBC:    Component Value Date/Time   WBC 16.3 (H) 12/16/2020 0519   HGB 7.1 (L) 12/16/2020 0519   HGB 7.5 (L) 10/02/2017 1126   HCT 19.7 (L) 12/16/2020 0519   HCT 22.0 (L) 10/02/2017 1126   PLT 278 12/16/2020 0519   PLT 511 (H) 10/02/2017 1126   MCV 98.0 12/16/2020 0519   MCV 94 10/02/2017 1126   NEUTROABS 11.8 (H) 12/14/2020 1125   NEUTROABS 8.8 (H) 10/02/2017 1126   LYMPHSABS 4.7 (H) 12/14/2020 1125   LYMPHSABS 5.2 (H) 10/02/2017 1126   MONOABS 1.2 (H) 12/14/2020 1125   EOSABS 0.3 12/14/2020 1125   EOSABS 0.2 10/02/2017 1126   BASOSABS 0.1 12/14/2020 1125   BASOSABS 0.1 10/02/2017 1126    Recent Results (from the past 240 hour(s))  Resp Panel by RT-PCR (Flu A&B, Covid) Nasopharyngeal Swab     Status: None   Collection  Time: 12/14/20  6:35 PM   Specimen: Nasopharyngeal Swab; Nasopharyngeal(NP) swabs in vial transport medium  Result Value Ref Range Status   SARS Coronavirus 2 by RT PCR NEGATIVE NEGATIVE Final    Comment: (NOTE) SARS-CoV-2 target nucleic acids are NOT DETECTED.  The SARS-CoV-2 RNA is generally detectable in upper respiratory specimens during the acute phase of infection. The lowest concentration of SARS-CoV-2 viral copies this assay can detect is 138 copies/mL. A negative result does not preclude SARS-Cov-2 infection and should not be used as the sole basis for treatment or other patient management decisions. A negative result may  occur with  improper specimen collection/handling, submission of specimen other than nasopharyngeal swab, presence of viral mutation(s) within the areas targeted by this assay, and inadequate number of viral copies(<138 copies/mL). A negative result must be combined with clinical observations, patient history, and epidemiological information. The expected result is Negative.  Fact Sheet for Patients:  EntrepreneurPulse.com.au  Fact Sheet for Healthcare Providers:  IncredibleEmployment.be  This test is no t yet approved or cleared by the [place] FDA and  has been authorized for detection and/or diagnosis of SARS-CoV-2 by FDA under an Emergency Use Authorization (EUA). This EUA will remain  in effect (meaning this test can be used) for the duration of the COVID-19 declaration under Section 564(b)(1) of the Act, 21 U.S.C.section 360bbb-3(b)(1), unless the authorization is terminated  or revoked sooner.       Influenza A by PCR NEGATIVE NEGATIVE Final   Influenza B by PCR NEGATIVE NEGATIVE Final    Comment: (NOTE) The Xpert Xpress SARS-CoV-2/FLU/RSV plus assay is intended as an aid in the diagnosis of influenza from Nasopharyngeal swab specimens and should not be used as a sole basis for treatment. Nasal washings and aspirates are unacceptable for Xpert Xpress SARS-CoV-2/FLU/RSV testing.  Fact Sheet for Patients: EntrepreneurPulse.com.au  Fact Sheet for Healthcare Providers: IncredibleEmployment.be  This test is not yet approved or cleared by the [place] FDA and has been authorized for detection and/or diagnosis of SARS-CoV-2 by FDA under an Emergency Use Authorization (EUA). This EUA will remain in effect (meaning this test can be used) for the duration of the COVID-19 declaration under Section 564(b)(1) of the Act, 21 U.S.C. section 360bbb-3(b)(1), unless the authorization is terminated  or revoked.  Performed at [facility], [place] [address], [place], [place] [address]     Studies/Results: No results found.  Medications: Scheduled Meds: . buPROPion  150 mg Oral Daily  . citalopram  40 mg Oral Daily  . enoxaparin (LOVENOX) injection  40 mg Subcutaneous Q24H  . folic acid  1 mg Oral Daily  . gabapentin  300 mg Oral TID  . HYDROmorphone   Intravenous Q4H  . ketorolac  15 mg Intravenous Q6H  . senna-docusate  1 tablet Oral BID   Continuous Infusions: . sodium chloride 125 mL/hr at 12/16/20 1442  . diphenhydrAMINE     PRN Meds:.diphenhydrAMINE **OR** diphenhydrAMINE, naloxone **AND** sodium chloride flush, ondansetron (ZOFRAN) IV, oxyCODONE, polyethylene glycol  Consultants:  None  Procedures:  None  Antibiotics:  None  Assessment/Plan: Principal Problem:   Sickle cell pain crisis ([place]) Active Problems:   Anemia of chronic disease   Depression   Polysubstance abuse ([place])   Tobacco abuse  1. Hb Sickle Cell Disease with crisis: We will reduce IVF D5 .45% Saline to 75 mls/hour, will continue weight based Dilaudid PCA at 0.5/10/3, will continue IV Toradol 15 mg Q 6 H for total of 5  days, continue oral home pain medications as ordered.  Monitor vitals very closely, Re-evaluate pain scale regularly, 2 L of Oxygen by [place]. 2. Leukocytosis: Improving.  Most likely reactive to sickle cell vaso-occlusive crisis. There is no fever or other symptoms of systemic inflammation to warrant antibiotics. We will continue to monitor very closely.  Repeat labs in the a.m. 3. Sickle Cell Anemia: Hemoglobin has dropped to 7.1 from baseline of around 9. There is still no clinical indication for blood transfusion today but will transfuse if hemoglobin drops below 6. We will continue to monitor very closely, continue hydroxyurea and folic acid. 4. Chronic pain Syndrome: Continue home pain medications. 5. Tobacco abuse: [place] was counseled on the dangers of tobacco use, and  was advised to quit. Reviewed strategies to maximize success, including removing cigarettes and smoking materials from environment, stress management and support of family/friends. 6. History of depression and anxiety: Patient denied any suicidal ideations or thought. Continue Wellbutrin and Celexa  Code Status: Full Code Family Communication: N/A Disposition Plan: Not yet ready for discharge  Ajai Harville  If 7PM-7AM, please contact night-coverage.  12/16/2020, 6:43 PM  LOS: 2 days Patient ID: Jeffrey Morrison, male   DOB: [birth year], 25 y.o.   MRN: [id number]

## 2020-12-17 DIAGNOSIS — D5700 Hb-SS disease with crisis, unspecified: Secondary | ICD-10-CM | POA: Diagnosis not present

## 2020-12-17 LAB — CBC WITH DIFFERENTIAL/PLATELET
Abs Immature Granulocytes: 0.3 10*3/uL — ABNORMAL HIGH (ref 0.00–0.07)
Basophils Absolute: 0 10*3/uL (ref 0.0–0.1)
Basophils Relative: 0 %
Eosinophils Absolute: 0 10*3/uL (ref 0.0–0.5)
Eosinophils Relative: 0 %
HCT: 18 % — ABNORMAL LOW (ref 39.0–52.0)
Hemoglobin: 6.2 g/dL — CL (ref 13.0–17.0)
Immature Granulocytes: 2 %
Lymphocytes Relative: 16 %
Lymphs Abs: 2.6 10*3/uL (ref 0.7–4.0)
MCH: 34.1 pg — ABNORMAL HIGH (ref 26.0–34.0)
MCHC: 34.4 g/dL (ref 30.0–36.0)
MCV: 98.9 fL (ref 80.0–100.0)
Monocytes Absolute: 2.2 10*3/uL — ABNORMAL HIGH (ref 0.1–1.0)
Monocytes Relative: 14 %
Neutro Abs: 11 10*3/uL — ABNORMAL HIGH (ref 1.7–7.7)
Neutrophils Relative %: 68 %
Platelets: 271 10*3/uL (ref 150–400)
RBC: 1.82 MIL/uL — ABNORMAL LOW (ref 4.22–5.81)
RDW: 20.6 % — ABNORMAL HIGH (ref 11.5–15.5)
WBC: 16.1 10*3/uL — ABNORMAL HIGH (ref 4.0–10.5)
nRBC: 23.4 % — ABNORMAL HIGH (ref 0.0–0.2)

## 2020-12-17 LAB — BASIC METABOLIC PANEL
Anion gap: 7 (ref 5–15)
BUN: 13 mg/dL (ref 6–20)
CO2: 25 mmol/L (ref 22–32)
Calcium: 8.4 mg/dL — ABNORMAL LOW (ref 8.9–10.3)
Chloride: 105 mmol/L (ref 98–111)
Creatinine, Ser: 0.82 mg/dL (ref 0.61–1.24)
GFR, Estimated: >60 mL/min (ref >60)
Glucose, Bld: 99 mg/dL (ref 70–99)
Potassium: 4.8 mmol/L (ref 3.5–5.1)
Sodium: 137 mmol/L (ref 135–145)

## 2020-12-17 LAB — PREPARE RBC (CROSSMATCH)

## 2020-12-17 MED ORDER — ACETAMINOPHEN 325 MG PO TABS
650.0000 mg | ORAL_TABLET | Freq: Once | ORAL | Status: AC
  Administered 2020-12-17: 650 mg via ORAL
  Filled 2020-12-17: qty 2

## 2020-12-17 MED ORDER — SODIUM CHLORIDE 0.9% IV SOLUTION: Freq: Once | INTRAVENOUS | Status: AC

## 2020-12-17 MED ORDER — ACETAMINOPHEN 325 MG PO TABS: 650.0000 mg | ORAL_TABLET | Freq: Four times a day (QID) | ORAL | Status: DC | PRN

## 2020-12-17 MED ORDER — DIPHENHYDRAMINE HCL 50 MG/ML IJ SOLN
25.0000 mg | Freq: Once | INTRAMUSCULAR | Status: AC
  Administered 2020-12-17: 25 mg via INTRAVENOUS
  Filled 2020-12-17: qty 1

## 2020-12-17 NOTE — Progress Notes (Signed)
Date and time results received: 12/17/20   Test: HGB Critical Value: 6.2  Name of Provider Notified: [place]  Orders Received? Or Actions Taken?: Pending

## 2020-12-17 NOTE — Progress Notes (Signed)
Subjective: Jeffrey Morrison is a 25 year old male with a medical history significant for sickle cell anemia, chronic pain syndrome, history of polysubstance abuse, and history of depression and anxiety was admitted for sickle cell pain crisis.  Today, patient's hemoglobin is decreased to 6.2 which appears to be below his baseline.  Patient denies any headache, dizziness, chest pain, shortness of breath, urinary symptoms, nausea, vomiting, or diarrhea.  He is endorsing pain primarily to low back and upper extremities.  Pain is rated 8/10.  Objective:  Vital signs in last 24 hours:  Vitals:   12/17/20 0449 12/17/20 0845 12/17/20 1002 12/17/20 1131  BP:   112/62   Pulse:   81   Resp: 17 14 15 14   Temp:   98.8 F (37.1 C)   TempSrc:   Oral   SpO2: 99% 94% 96% 100%  Weight:      Height:        Intake/Output from previous day:   Intake/Output Summary (Last 24 hours) at 12/17/2020 1521 Last data filed at 12/17/2020 1228 Gross per 24 hour  Intake 1635 ml  Output 2300 ml  Net -665 ml    Physical Exam: General: Alert, awake, oriented x3, in no acute distress.  HEENT: [place]/AT PEERL, EOMI Neck: Trachea midline,  no masses, no thyromegal,y no JVD, no carotid bruit OROPHARYNX:  Moist, No exudate/ erythema/lesions.  Heart: Regular rate and rhythm, without murmurs, rubs, gallops, PMI non-displaced, no heaves or thrills on palpation.  Lungs: Clear to auscultation, no wheezing or rhonchi noted. No increased vocal fremitus resonant to percussion  Abdomen: Soft, nontender, nondistended, positive bowel sounds, no masses no hepatosplenomegaly noted..  Neuro: No focal neurological deficits noted cranial nerves II through XII grossly intact. DTRs 2+ bilaterally upper and lower extremities. Strength 5 out of 5 in bilateral upper and lower extremities. Musculoskeletal: No warm swelling or erythema around joints, no spinal tenderness noted. Psychiatric: Patient alert and oriented x3, good insight and  cognition, good recent to remote recall. Lymph node survey: No cervical axillary or inguinal lymphadenopathy noted.  Lab Results:  Basic Metabolic Panel:    Component Value Date/Time   NA 137 12/17/2020 0508   NA 140 10/02/2017 1126   K 4.8 12/17/2020 0508   CL 105 12/17/2020 0508   CO2 25 12/17/2020 0508   BUN 13 12/17/2020 0508   BUN 9 10/02/2017 1126   CREATININE 0.82 12/17/2020 0508   GLUCOSE 99 12/17/2020 0508   CALCIUM 8.4 (L) 12/17/2020 0508   CBC:    Component Value Date/Time   WBC 16.1 (H) 12/17/2020 0508   HGB 6.2 (LL) 12/17/2020 0508   HGB 7.5 (L) 10/02/2017 1126   HCT 18.0 (L) 12/17/2020 0508   HCT 22.0 (L) 10/02/2017 1126   PLT 271 12/17/2020 0508   PLT 511 (H) 10/02/2017 1126   MCV 98.9 12/17/2020 0508   MCV 94 10/02/2017 1126   NEUTROABS 11.0 (H) 12/17/2020 0508   NEUTROABS 8.8 (H) 10/02/2017 1126   LYMPHSABS 2.6 12/17/2020 0508   LYMPHSABS 5.2 (H) 10/02/2017 1126   MONOABS 2.2 (H) 12/17/2020 0508   EOSABS 0.0 12/17/2020 0508   EOSABS 0.2 10/02/2017 1126   BASOSABS 0.0 12/17/2020 0508   BASOSABS 0.1 10/02/2017 1126    Recent Results (from the past 240 hour(s))  Resp Panel by RT-PCR (Flu A&B, Covid) Nasopharyngeal Swab     Status: None   Collection Time: 12/14/20  6:35 PM   Specimen: Nasopharyngeal Swab; Nasopharyngeal(NP) swabs in vial transport medium  Result Value Ref Range Status   SARS Coronavirus 2 by RT PCR NEGATIVE NEGATIVE Final    Comment: (NOTE) SARS-CoV-2 target nucleic acids are NOT DETECTED.  The SARS-CoV-2 RNA is generally detectable in upper respiratory specimens during the acute phase of infection. The lowest concentration of SARS-CoV-2 viral copies this assay can detect is 138 copies/mL. A negative result does not preclude SARS-Cov-2 infection and should not be used as the sole basis for treatment or other patient management decisions. A negative result may occur with  improper specimen collection/handling, submission of  specimen other than nasopharyngeal swab, presence of viral mutation(s) within the areas targeted by this assay, and inadequate number of viral copies(<138 copies/mL). A negative result must be combined with clinical observations, patient history, and epidemiological information. The expected result is Negative.  Fact Sheet for Patients:  EntrepreneurPulse.com.au  Fact Sheet for Healthcare Providers:  IncredibleEmployment.be  This test is no t yet approved or cleared by the [place] FDA and  has been authorized for detection and/or diagnosis of SARS-CoV-2 by FDA under an Emergency Use Authorization (EUA). This EUA will remain  in effect (meaning this test can be used) for the duration of the COVID-19 declaration under Section 564(b)(1) of the Act, 21 U.S.C.section 360bbb-3(b)(1), unless the authorization is terminated  or revoked sooner.       Influenza A by PCR NEGATIVE NEGATIVE Final   Influenza B by PCR NEGATIVE NEGATIVE Final    Comment: (NOTE) The Xpert Xpress SARS-CoV-2/FLU/RSV plus assay is intended as an aid in the diagnosis of influenza from Nasopharyngeal swab specimens and should not be used as a sole basis for treatment. Nasal washings and aspirates are unacceptable for Xpert Xpress SARS-CoV-2/FLU/RSV testing.  Fact Sheet for Patients: EntrepreneurPulse.com.au  Fact Sheet for Healthcare Providers: IncredibleEmployment.be  This test is not yet approved or cleared by the [place] FDA and has been authorized for detection and/or diagnosis of SARS-CoV-2 by FDA under an Emergency Use Authorization (EUA). This EUA will remain in effect (meaning this test can be used) for the duration of the COVID-19 declaration under Section 564(b)(1) of the Act, 21 U.S.C. section 360bbb-3(b)(1), unless the authorization is terminated or revoked.  Performed at [facility], [place] [address],  [place], [place] [address]     Studies/Results: No results found.  Medications: Scheduled Meds: . sodium chloride   Intravenous Once  . acetaminophen  650 mg Oral Once  . buPROPion  150 mg Oral Daily  . citalopram  40 mg Oral Daily  . diphenhydrAMINE  25 mg Intravenous Once  . enoxaparin (LOVENOX) injection  40 mg Subcutaneous Q24H  . folic acid  1 mg Oral Daily  . gabapentin  300 mg Oral TID  . HYDROmorphone   Intravenous Q4H  . ketorolac  15 mg Intravenous Q6H  . senna-docusate  1 tablet Oral BID   Continuous Infusions: . sodium chloride 75 mL/hr at 12/17/20 1140  . diphenhydrAMINE     PRN Meds:.diphenhydrAMINE **OR** diphenhydrAMINE, naloxone **AND** sodium chloride flush, ondansetron (ZOFRAN) IV, oxyCODONE, polyethylene glycol  Consultants:  None  Procedures:  None  Antibiotics:  None  Assessment/Plan: Principal Problem:   Sickle cell pain crisis ([place]) Active Problems:   Anemia of chronic disease   Depression   Polysubstance abuse ([place])   Tobacco abuse  Sickle cell disease with pain crisis: Decrease IV fluids to KVO Continue IV Dilaudid PCA without any changes in settings Continue IV Toradol 15 mg every 6 hours for total of  5 days Monitor vital signs very closely, reevaluate pain scale regularly, and supplemental oxygen as needed.  Leukocytosis: Improving.  More than likely reactive secondary to sickle cell vaso-occlusive crisis.  Patient continues to be afebrile without other signs of infection or inflammation.  He does not warrant antibiotics at this time.  Continue to monitor closely.  Sickle cell anemia: Hemoglobin is currently decreased to 6.2, which appears to be below patient's baseline.  Will transfuse 1 unit PRBCs today.  Obtain posttransfusion CBC.  Follow closely.  Chronic pain syndrome: Continue home medications.  Tobacco abuse: Patient counseled extensively on admission.  Nicotine patch is offered.  History of depression and  anxiety: Continue Wellbutrin and Celexa.  Patient is stable without any suicidal or homicidal ideations today  Code Status: Full Code Family Communication: N/A Disposition Plan: Not yet ready for discharge  [place], MSN, FNP-C Patient [place], [place] [address] [phone]  If 5PM-7AM, please contact night-coverage.  12/17/2020, 3:21 PM  LOS: 3 days

## 2020-12-18 DIAGNOSIS — D5700 Hb-SS disease with crisis, unspecified: Secondary | ICD-10-CM | POA: Diagnosis not present

## 2020-12-18 LAB — CBC
HCT: 18.7 % — ABNORMAL LOW (ref 39.0–52.0)
Hemoglobin: 6.5 g/dL — CL (ref 13.0–17.0)
MCH: 32.8 pg (ref 26.0–34.0)
MCHC: 34.8 g/dL (ref 30.0–36.0)
MCV: 94.4 fL (ref 80.0–100.0)
Platelets: 314 10*3/uL (ref 150–400)
RBC: 1.98 MIL/uL — ABNORMAL LOW (ref 4.22–5.81)
RDW: 20.4 % — ABNORMAL HIGH (ref 11.5–15.5)
WBC: 14.5 10*3/uL — ABNORMAL HIGH (ref 4.0–10.5)
nRBC: 18.2 % — ABNORMAL HIGH (ref 0.0–0.2)

## 2020-12-18 LAB — PREPARE RBC (CROSSMATCH)

## 2020-12-18 MED ORDER — DIPHENHYDRAMINE HCL 50 MG/ML IJ SOLN
25.0000 mg | Freq: Once | INTRAMUSCULAR | Status: AC
  Administered 2020-12-18: 25 mg via INTRAVENOUS
  Filled 2020-12-18: qty 1

## 2020-12-18 MED ORDER — ACETAMINOPHEN 325 MG PO TABS
650.0000 mg | ORAL_TABLET | Freq: Once | ORAL | Status: AC
  Administered 2020-12-18: 650 mg via ORAL
  Filled 2020-12-18: qty 2

## 2020-12-18 MED ORDER — SODIUM CHLORIDE 0.9% IV SOLUTION: Freq: Once | INTRAVENOUS | Status: DC

## 2020-12-18 NOTE — BH Specialist Note (Signed)
Integrated Behavioral Health Case Management Referral Note  12/18/2020 Name: Jeffrey Morrison MRN: [id number] DOB: 1995/12/12 Jeffrey Morrison is a 26 y.o. year old male who sees Jeffrey Jun, FNP for primary care. LCSW was consulted to assess patient's needs and assist the patient with [employer] .  Interpreter: No.   Interpreter Name & Language: none  Assessment: Patient in need of community resources. Patient is also well connected with Education officer, museum at Jeffrey Morrison and Jeffrey Morrison). Referral from NP, Jeffrey Morrison, for multiple patient needs.  Intervention: CSW met with patient at bedside and introduced self and role at Patient Jeffrey Morrison). Provided CSW contact information and planned to follow up after Morrison discharge.  Review of patient status, including review of consultants reports, relevant laboratory and other test results, and collaboration with appropriate care team members and the patient's provider was performed as part of comprehensive patient evaluation and provision of services.    SDOH (Social Determinants of Health) assessments performed: No    Goals Addressed   None      Follow up Plan: 1. CSW to follow up with patient after Morrison discharge and support as needed.  Jeffrey Morrison, [facility] [phone]

## 2020-12-18 NOTE — Progress Notes (Signed)
Subjective: Jeffrey Morrison is a 24 year old male with a medical history significant for sickle cell anemia, chronic pain syndrome, history of polysubstance abuse, and history of depression and anxiety was admitted for sickle cell pain crisis.   Patient continues to have pain to low back and lower extremities. He rates pain as 8/10.   Today, patient's hemoglobin is 6.5. He is s/p one unit PRBCs on yesterday. He denies any headache, dizziness, chest pain, shortness of breath, urinary symptoms, nausea, vomiting, or diarrhea.   Objective:  Vital signs in last 24 hours:  Vitals:   12/18/20 0332 12/18/20 0425 12/18/20 1111 12/18/20 1752  BP:  116/60 115/61 103/67  Pulse:  78 84 79  Resp: 14 16 16 18   Temp:  98.8 F (37.1 C) 98.6 F (37 C) 98.7 F (37.1 C)  TempSrc:  Oral    SpO2: 96% 96% 98% 96%  Weight:      Height:        Intake/Output from previous day:   Intake/Output Summary (Last 24 hours) at 12/18/2020 1907 Last data filed at 12/18/2020 1340 Gross per 24 hour  Intake 3159 ml  Output 1400 ml  Net 1759 ml    Physical Exam: General: Alert, awake, oriented x3, in no acute distress.  HEENT: [employer]/AT PEERL, EOMI Neck: Trachea midline,  no masses, no thyromegal,y no JVD, no carotid bruit OROPHARYNX:  Moist, No exudate/ erythema/lesions.  Heart: Regular rate and rhythm, without murmurs, rubs, gallops, PMI non-displaced, no heaves or thrills on palpation.  Lungs: Clear to auscultation, no wheezing or rhonchi noted. No increased vocal fremitus resonant to percussion  Abdomen: Soft, nontender, nondistended, positive bowel sounds, no masses no hepatosplenomegaly noted..  Neuro: No focal neurological deficits noted cranial nerves II through XII grossly intact. DTRs 2+ bilaterally upper and lower extremities. Strength 5 out of 5 in bilateral upper and lower extremities. Musculoskeletal: No warm swelling or erythema around joints, no spinal tenderness noted. Psychiatric: Patient alert and  oriented x3, good insight and cognition, good recent to remote recall. Lymph node survey: No cervical axillary or inguinal lymphadenopathy noted.  Lab Results:  Basic Metabolic Panel:    Component Value Date/Time   NA 137 12/17/2020 0508   NA 140 10/02/2017 1126   K 4.8 12/17/2020 0508   CL 105 12/17/2020 0508   CO2 25 12/17/2020 0508   BUN 13 12/17/2020 0508   BUN 9 10/02/2017 1126   CREATININE 0.82 12/17/2020 0508   GLUCOSE 99 12/17/2020 0508   CALCIUM 8.4 (L) 12/17/2020 0508   CBC:    Component Value Date/Time   WBC 14.5 (H) 12/18/2020 0553   HGB 6.5 (LL) 12/18/2020 0553   HGB 7.5 (L) 10/02/2017 1126   HCT 18.7 (L) 12/18/2020 0553   HCT 22.0 (L) 10/02/2017 1126   PLT 314 12/18/2020 0553   PLT 511 (H) 10/02/2017 1126   MCV 94.4 12/18/2020 0553   MCV 94 10/02/2017 1126   NEUTROABS 11.0 (H) 12/17/2020 0508   NEUTROABS 8.8 (H) 10/02/2017 1126   LYMPHSABS 2.6 12/17/2020 0508   LYMPHSABS 5.2 (H) 10/02/2017 1126   MONOABS 2.2 (H) 12/17/2020 0508   EOSABS 0.0 12/17/2020 0508   EOSABS 0.2 10/02/2017 1126   BASOSABS 0.0 12/17/2020 0508   BASOSABS 0.1 10/02/2017 1126    Recent Results (from the past 240 hour(s))  Resp Panel by RT-PCR (Flu A&B, Covid) Nasopharyngeal Swab     Status: None   Collection Time: 12/14/20  6:35 PM   Specimen: Nasopharyngeal  Swab; Nasopharyngeal(NP) swabs in vial transport medium  Result Value Ref Range Status   SARS Coronavirus 2 by RT PCR NEGATIVE NEGATIVE Final    Comment: (NOTE) SARS-CoV-2 target nucleic acids are NOT DETECTED.  The SARS-CoV-2 RNA is generally detectable in upper respiratory specimens during the acute phase of infection. The lowest concentration of SARS-CoV-2 viral copies this assay can detect is 138 copies/mL. A negative result does not preclude SARS-Cov-2 infection and should not be used as the sole basis for treatment or other patient management decisions. A negative result may occur with  improper specimen  collection/handling, submission of specimen other than nasopharyngeal swab, presence of viral mutation(s) within the areas targeted by this assay, and inadequate number of viral copies(<138 copies/mL). A negative result must be combined with clinical observations, patient history, and epidemiological information. The expected result is Negative.  Fact Sheet for Patients:  EntrepreneurPulse.com.au  Fact Sheet for Healthcare Providers:  IncredibleEmployment.be  This test is no t yet approved or cleared by the [place] FDA and  has been authorized for detection and/or diagnosis of SARS-CoV-2 by FDA under an Emergency Use Authorization (EUA). This EUA will remain  in effect (meaning this test can be used) for the duration of the COVID-19 declaration under Section 564(b)(1) of the Act, 21 U.S.C.section 360bbb-3(b)(1), unless the authorization is terminated  or revoked sooner.       Influenza A by PCR NEGATIVE NEGATIVE Final   Influenza B by PCR NEGATIVE NEGATIVE Final    Comment: (NOTE) The Xpert Xpress SARS-CoV-2/FLU/RSV plus assay is intended as an aid in the diagnosis of influenza from Nasopharyngeal swab specimens and should not be used as a sole basis for treatment. Nasal washings and aspirates are unacceptable for Xpert Xpress SARS-CoV-2/FLU/RSV testing.  Fact Sheet for Patients: EntrepreneurPulse.com.au  Fact Sheet for Healthcare Providers: IncredibleEmployment.be  This test is not yet approved or cleared by the [place] FDA and has been authorized for detection and/or diagnosis of SARS-CoV-2 by FDA under an Emergency Use Authorization (EUA). This EUA will remain in effect (meaning this test can be used) for the duration of the COVID-19 declaration under Section 564(b)(1) of the Act, 21 U.S.C. section 360bbb-3(b)(1), unless the authorization is terminated or revoked.  Performed at [facility], [place] [address] [place], [place] [address]     Studies/Results: No results found.  Medications: Scheduled Meds: . sodium chloride   Intravenous Once  . acetaminophen  650 mg Oral Once  . buPROPion  150 mg Oral Daily  . citalopram  40 mg Oral Daily  . diphenhydrAMINE  25 mg Intravenous Once  . enoxaparin (LOVENOX) injection  40 mg Subcutaneous Q24H  . folic acid  1 mg Oral Daily  . gabapentin  300 mg Oral TID  . HYDROmorphone   Intravenous Q4H  . ketorolac  15 mg Intravenous Q6H  . senna-docusate  1 tablet Oral BID   Continuous Infusions: . sodium chloride 10 mL/hr at 12/18/20 1032  . diphenhydrAMINE     PRN Meds:.diphenhydrAMINE **OR** diphenhydrAMINE, naloxone **AND** sodium chloride flush, ondansetron (ZOFRAN) IV, oxyCODONE, polyethylene glycol  Consultants:  None  Procedures:  None  Antibiotics:  None  Assessment/Plan: Principal Problem:   Sickle cell pain crisis ([place]) Active Problems:   Anemia of chronic disease   Depression   Polysubstance abuse ([place])   Tobacco abuse Sickle cell disease with pain crisis: Continue IV fluids to KVO.  Continue IV Dilaudid PCA without any changes in settings.  Toradol 15  mg IV every 6 hours for total of 5 days.  Monitor vital signs very closely, reevaluate pain scale regularly, and supplemental oxygen as needed.  Leukocytosis: Improving.  More than likely secondary to vaso-occlusive crisis.  Patient continues to be afebrile without any signs of infection or inflammation.  No antibiotics at this time.  Continue to follow closely.  Sickle cell anemia: Today, hemoglobin is 6.5.  Transfuse additional unit of PRBCs.  Follow CBC in AM.  Chronic pain syndrome: Continue home medications  Tobacco abuse: Patient counseled extensively.  Nicotine patch is offered.  History of depression and anxiety: Continue Wellbutrin and Celexa.  Patient has no suicidal or homicidal ideations at this time.  Code Status: Full  Code Family Communication: N/A Disposition Plan: Not yet ready for discharge  [place], MSN, FNP-C Patient [place] [address] [place], [place] [address] [phone] If 5PM-8AM, please contact night-coverage.  12/18/2020, 7:07 PM  LOS: 4 days

## 2020-12-19 DIAGNOSIS — D5700 Hb-SS disease with crisis, unspecified: Secondary | ICD-10-CM | POA: Diagnosis not present

## 2020-12-19 LAB — CBC
HCT: 19.7 % — ABNORMAL LOW (ref 39.0–52.0)
HCT: 18.1 % — ABNORMAL LOW (ref 39.0–52.0)
Hemoglobin: 6.7 g/dL — CL (ref 13.0–17.0)
Hemoglobin: 6.2 g/dL — CL (ref 13.0–17.0)
MCH: 32.5 pg (ref 26.0–34.0)
MCH: 32.3 pg (ref 26.0–34.0)
MCHC: 34 g/dL (ref 30.0–36.0)
MCHC: 34.3 g/dL (ref 30.0–36.0)
MCV: 95.6 fL (ref 80.0–100.0)
MCV: 94.3 fL (ref 80.0–100.0)
Platelets: 353 10*3/uL (ref 150–400)
Platelets: 329 10*3/uL (ref 150–400)
RBC: 2.06 MIL/uL — ABNORMAL LOW (ref 4.22–5.81)
RBC: 1.92 MIL/uL — ABNORMAL LOW (ref 4.22–5.81)
RDW: 20.1 % — ABNORMAL HIGH (ref 11.5–15.5)
RDW: 20.1 % — ABNORMAL HIGH (ref 11.5–15.5)
WBC: 12.7 10*3/uL — ABNORMAL HIGH (ref 4.0–10.5)
WBC: 12.7 10*3/uL — ABNORMAL HIGH (ref 4.0–10.5)
nRBC: 12.8 % — ABNORMAL HIGH (ref 0.0–0.2)
nRBC: 11.4 % — ABNORMAL HIGH (ref 0.0–0.2)

## 2020-12-19 MED ORDER — HYDROMORPHONE 1 MG/ML IV SOLN
INTRAVENOUS | Status: DC
  Administered 2020-12-19: 0 mg via INTRAVENOUS
  Administered 2020-12-20: 3 mg via INTRAVENOUS
  Administered 2020-12-20 (×2): 3.5 mg via INTRAVENOUS

## 2020-12-19 MED ORDER — ACETAMINOPHEN 325 MG PO TABS
650.0000 mg | ORAL_TABLET | Freq: Once | ORAL | Status: AC
  Administered 2020-12-19: 650 mg via ORAL
  Filled 2020-12-19: qty 2

## 2020-12-19 NOTE — Progress Notes (Signed)
Blood transfusion complete, Pt states he feels fine. VS T: 98.1

## 2020-12-19 NOTE — Progress Notes (Signed)
Subjective: Jeffrey Morrison is a 25 year old male with a medical history significant for sickle cell disease, chronic pain syndrome, history of polysubstance abuse, and history of depression and anxiety was admitted in sickle cell pain crisis.   Today, patient's hemoglobin is 6.2. He is status post 2 units of PRBCs. Patient continues to complain of pain to left upper extremity rated 8/10.  He denies any headache, chest pain, urinary symptoms, nausea, vomiting, or diarrhea.  Objective:  Vital signs in last 24 hours:  Vitals:   12/19/20 0500 12/19/20 0608 12/19/20 0957 12/19/20 1428  BP:   110/60 (!) 99/58  Pulse:   67 70  Resp: 14 14 12 15   Temp:    98.8 F (37.1 C)  TempSrc:    Oral  SpO2: 98% 98% 97% 98%  Weight:      Height:        Intake/Output from previous day:   Intake/Output Summary (Last 24 hours) at 12/19/2020 1558 Last data filed at 12/19/2020 0354 Gross per 24 hour  Intake 460 ml  Output 1400 ml  Net -940 ml    Physical Exam: General: Alert, awake, oriented x3, in no acute distress.  HEENT: [place]/AT PEERL, EOMI Neck: Trachea midline,  no masses, no thyromegal,y no JVD, no carotid bruit OROPHARYNX:  Moist, No exudate/ erythema/lesions.  Heart: Regular rate and rhythm, without murmurs, rubs, gallops, PMI non-displaced, no heaves or thrills on palpation.  Lungs: Clear to auscultation, no wheezing or rhonchi noted. No increased vocal fremitus resonant to percussion  Abdomen: Soft, nontender, nondistended, positive bowel sounds, no masses no hepatosplenomegaly noted..  Neuro: No focal neurological deficits noted cranial nerves II through XII grossly intact. DTRs 2+ bilaterally upper and lower extremities. Strength 5 out of 5 in bilateral upper and lower extremities. Musculoskeletal: No warm swelling or erythema around joints, no spinal tenderness noted. Psychiatric: Patient alert and oriented x3, good insight and cognition, good recent to remote recall. Lymph node survey: No  cervical axillary or inguinal lymphadenopathy noted.  Lab Results:  Basic Metabolic Panel:    Component Value Date/Time   NA 137 12/17/2020 0508   NA 140 10/02/2017 1126   K 4.8 12/17/2020 0508   CL 105 12/17/2020 0508   CO2 25 12/17/2020 0508   BUN 13 12/17/2020 0508   BUN 9 10/02/2017 1126   CREATININE 0.82 12/17/2020 0508   GLUCOSE 99 12/17/2020 0508   CALCIUM 8.4 (L) 12/17/2020 0508   CBC:    Component Value Date/Time   WBC 12.7 (H) 12/19/2020 0742   HGB 6.2 (LL) 12/19/2020 0742   HGB 7.5 (L) 10/02/2017 1126   HCT 18.1 (L) 12/19/2020 0742   HCT 22.0 (L) 10/02/2017 1126   PLT 329 12/19/2020 0742   PLT 511 (H) 10/02/2017 1126   MCV 94.3 12/19/2020 0742   MCV 94 10/02/2017 1126   NEUTROABS 11.0 (H) 12/17/2020 0508   NEUTROABS 8.8 (H) 10/02/2017 1126   LYMPHSABS 2.6 12/17/2020 0508   LYMPHSABS 5.2 (H) 10/02/2017 1126   MONOABS 2.2 (H) 12/17/2020 0508   EOSABS 0.0 12/17/2020 0508   EOSABS 0.2 10/02/2017 1126   BASOSABS 0.0 12/17/2020 0508   BASOSABS 0.1 10/02/2017 1126    Recent Results (from the past 240 hour(s))  Resp Panel by RT-PCR (Flu A&B, Covid) Nasopharyngeal Swab     Status: None   Collection Time: 12/14/20  6:35 PM   Specimen: Nasopharyngeal Swab; Nasopharyngeal(NP) swabs in vial transport medium  Result Value Ref Range Status  SARS Coronavirus 2 by RT PCR NEGATIVE NEGATIVE Final    Comment: (NOTE) SARS-CoV-2 target nucleic acids are NOT DETECTED.  The SARS-CoV-2 RNA is generally detectable in upper respiratory specimens during the acute phase of infection. The lowest concentration of SARS-CoV-2 viral copies this assay can detect is 138 copies/mL. A negative result does not preclude SARS-Cov-2 infection and should not be used as the sole basis for treatment or other patient management decisions. A negative result may occur with  improper specimen collection/handling, submission of specimen other than nasopharyngeal swab, presence of viral mutation(s)  within the areas targeted by this assay, and inadequate number of viral copies(<138 copies/mL). A negative result must be combined with clinical observations, patient history, and epidemiological information. The expected result is Negative.  Fact Sheet for Patients:  EntrepreneurPulse.com.au  Fact Sheet for Healthcare Providers:  IncredibleEmployment.be  This test is no t yet approved or cleared by the [place] FDA and  has been authorized for detection and/or diagnosis of SARS-CoV-2 by FDA under an Emergency Use Authorization (EUA). This EUA will remain  in effect (meaning this test can be used) for the duration of the COVID-19 declaration under Section 564(b)(1) of the Act, 21 U.S.C.section 360bbb-3(b)(1), unless the authorization is terminated  or revoked sooner.       Influenza A by PCR NEGATIVE NEGATIVE Final   Influenza B by PCR NEGATIVE NEGATIVE Final    Comment: (NOTE) The Xpert Xpress SARS-CoV-2/FLU/RSV plus assay is intended as an aid in the diagnosis of influenza from Nasopharyngeal swab specimens and should not be used as a sole basis for treatment. Nasal washings and aspirates are unacceptable for Xpert Xpress SARS-CoV-2/FLU/RSV testing.  Fact Sheet for Patients: EntrepreneurPulse.com.au  Fact Sheet for Healthcare Providers: IncredibleEmployment.be  This test is not yet approved or cleared by the [place] FDA and has been authorized for detection and/or diagnosis of SARS-CoV-2 by FDA under an Emergency Use Authorization (EUA). This EUA will remain in effect (meaning this test can be used) for the duration of the COVID-19 declaration under Section 564(b)(1) of the Act, 21 U.S.C. section 360bbb-3(b)(1), unless the authorization is terminated or revoked.  Performed at [facility], [place] [address] [place], [place] [address]     Studies/Results: No results  found.  Medications: Scheduled Meds: . sodium chloride   Intravenous Once  . buPROPion  150 mg Oral Daily  . citalopram  40 mg Oral Daily  . enoxaparin (LOVENOX) injection  40 mg Subcutaneous Q24H  . folic acid  1 mg Oral Daily  . gabapentin  300 mg Oral TID  . HYDROmorphone   Intravenous Q4H  . ketorolac  15 mg Intravenous Q6H  . senna-docusate  1 tablet Oral BID   Continuous Infusions: . sodium chloride 10 mL/hr at 12/18/20 1032   PRN Meds:.diphenhydrAMINE **OR** [DISCONTINUED] diphenhydrAMINE, naloxone **AND** sodium chloride flush, ondansetron (ZOFRAN) IV, oxyCODONE, polyethylene glycol  Consultants:  None  Procedures:  None  Antibiotics:  None  Assessment/Plan: Principal Problem:   Sickle cell pain crisis (HCC) Active Problems:   Anemia of chronic disease   Depression   Polysubstance abuse ([place])   Tobacco abuse  Sickle cell disease with pain crisis: Continue IV fluids at [facility] IV Dilaudid PCA, settings decreased to 0.5 mg, 10-minute lockout, and 2 mg/h Toradol 15 mg IV every 6 hours for total of 5 days Monitor vital signs very closely, reevaluate pain scale regularly, and supplemental oxygen as needed.  Leukocytosis: Improving.  Patient continues to  be afebrile without any signs of infection or inflammation.  Continue to monitor closely without antibiotics.  CBC in AM.  Sickle cell anemia: Hemoglobin stable at 6.2.  Patient is status post 2 units PRBCs.  Continue to follow closely.  Transfuse conservatively.  Additional unit of PRBCs warranted if patient's hemoglobin drops below 6.0 g/dL.  Possible partial exchange transfusion on 12/20/2020.  Chronic pain syndrome: Continue home medications  Tobacco abuse: Patient counseled extensively on admission.  Continue to follow closely.  Nicotine patches offered, patient declined.  History of depression anxiety: Continue home medications.  Patient has no suicidal homicidal ideations on today.  Code Status:  Full Code Family Communication: N/A Disposition Plan: Not yet ready for discharge  Roxie, MSN, FNP-C Patient [facility] [address] [place],  [address] [phone]  If 5PM-8AM, please contact night-coverage.  12/19/2020, 3:58 PM  LOS: 5 days

## 2020-12-19 NOTE — Progress Notes (Signed)
Blood transfusion infusing, Pt states he feels OK.

## 2020-12-19 NOTE — Progress Notes (Signed)
Blood transfusion infusing, Pt resting in bed eyes closed no s/s of distress

## 2020-12-19 NOTE — Progress Notes (Signed)
Blood transfusion started pre-meds given prior to starting pre transf VS T: 99.7, 15 min VS T 101.2 Pt c/o left elbow pain, transfusion stopped, MD notified and advised that Pt has had temps during admission and to admin additional dose Tylenol and OK to restart transfusion, Tylenol administered transfusion restarted, Pt states he feels ok.

## 2020-12-20 ENCOUNTER — Inpatient Hospital Stay (HOSPITAL_COMMUNITY): Payer: Medicaid Other

## 2020-12-20 DIAGNOSIS — D5700 Hb-SS disease with crisis, unspecified: Secondary | ICD-10-CM | POA: Diagnosis not present

## 2020-12-20 LAB — CBC
HCT: 21.5 % — ABNORMAL LOW (ref 39.0–52.0)
Hemoglobin: 7.3 g/dL — ABNORMAL LOW (ref 13.0–17.0)
MCH: 32.3 pg (ref 26.0–34.0)
MCHC: 34 g/dL (ref 30.0–36.0)
MCV: 95.1 fL (ref 80.0–100.0)
Platelets: 449 10*3/uL — ABNORMAL HIGH (ref 150–400)
RBC: 2.26 MIL/uL — ABNORMAL LOW (ref 4.22–5.81)
RDW: 20.1 % — ABNORMAL HIGH (ref 11.5–15.5)
WBC: 20.3 10*3/uL — ABNORMAL HIGH (ref 4.0–10.5)
nRBC: 6.7 % — ABNORMAL HIGH (ref 0.0–0.2)

## 2020-12-20 LAB — HEPATITIS PANEL, ACUTE
HCV Ab: REACTIVE — AB
Hep A IgM: NONREACTIVE
Hep B C IgM: NONREACTIVE
Hepatitis B Surface Ag: NONREACTIVE

## 2020-12-20 LAB — LACTIC ACID, PLASMA: Lactic Acid, Venous: 0.8 mmol/L (ref 0.5–1.9)

## 2020-12-20 LAB — GLUCOSE, CAPILLARY: Glucose-Capillary: 94 mg/dL (ref 70–99)

## 2020-12-20 MED ORDER — HYDROMORPHONE HCL 2 MG/ML IJ SOLN
2.0000 mg | INTRAMUSCULAR | Status: DC | PRN
  Administered 2020-12-20 – 2020-12-23 (×15): 2 mg via INTRAVENOUS
  Filled 2020-12-20 (×15): qty 1

## 2020-12-20 MED ORDER — ACETAMINOPHEN 325 MG PO TABS
650.0000 mg | ORAL_TABLET | ORAL | Status: DC | PRN
  Administered 2020-12-20: 650 mg via ORAL
  Filled 2020-12-20: qty 2

## 2020-12-20 MED ORDER — OXYCODONE HCL 5 MG PO TABS
10.0000 mg | ORAL_TABLET | ORAL | Status: DC
  Administered 2020-12-20 – 2020-12-23 (×15): 10 mg via ORAL
  Filled 2020-12-20 (×15): qty 2

## 2020-12-20 NOTE — Progress Notes (Signed)
Subjective: Jeffrey Morrison is a 25 year old male with a medical history significant for sickle cell disease, chronic pain syndrome, history of polysubstance abuse, and history of depression and anxiety was admitted with sickle cell crisis. Today, patient's hemoglobin has improved.  He is s/p 2 units PRBCs.  Pain intensity is 7/10 primarily to left upper extremity and central chest.  He denies any shortness of breath, headache, nausea, vomiting, diarrhea, or urinary symptoms.  Patient is requesting a test for hepatitis.  He states that a friend recently told him about a possible exposure and he is concerned.  Objective:  Vital signs in last 24 hours:  Vitals:   12/20/20 1354 12/20/20 1359 12/20/20 1614 12/20/20 1620  BP:   125/62   Pulse: 99  87   Resp:   18   Temp: 99 F (37.2 C)  97.8 F (36.6 C)   TempSrc: Axillary  Oral   SpO2: (!) 88% 93% (!) 87% 94%  Weight:      Height:        Intake/Output from previous day:   Intake/Output Summary (Last 24 hours) at 12/20/2020 1955 Last data filed at 12/20/2020 0610 Gross per 24 hour  Intake --  Output 1000 ml  Net -1000 ml    Physical Exam: General: Alert, awake, oriented x3, in no acute distress.  HEENT: [place]/AT PEERL, EOMI Neck: Trachea midline,  no masses, no thyromegal,y no JVD, no carotid bruit OROPHARYNX:  Moist, No exudate/ erythema/lesions.  Heart: Regular rate and rhythm, without murmurs, rubs, gallops, PMI non-displaced, no heaves or thrills on palpation.  Lungs: Clear to auscultation, no wheezing or rhonchi noted. No increased vocal fremitus resonant to percussion  Abdomen: Soft, nontender, nondistended, positive bowel sounds, no masses no hepatosplenomegaly noted..  Neuro: No focal neurological deficits noted cranial nerves II through XII grossly intact. DTRs 2+ bilaterally upper and lower extremities. Strength 5 out of 5 in bilateral upper and lower extremities. Musculoskeletal: No warm swelling or erythema around joints, no  spinal tenderness noted. Psychiatric: Patient alert and oriented x3, good insight and cognition, good recent to remote recall. Lymph node survey: No cervical axillary or inguinal lymphadenopathy noted.  Lab Results:  Basic Metabolic Panel:    Component Value Date/Time   NA 137 12/17/2020 0508   NA 140 10/02/2017 1126   K 4.8 12/17/2020 0508   CL 105 12/17/2020 0508   CO2 25 12/17/2020 0508   BUN 13 12/17/2020 0508   BUN 9 10/02/2017 1126   CREATININE 0.82 12/17/2020 0508   GLUCOSE 99 12/17/2020 0508   CALCIUM 8.4 (L) 12/17/2020 0508   CBC:    Component Value Date/Time   WBC 20.3 (H) 12/20/2020 0448   HGB 7.3 (L) 12/20/2020 0448   HGB 7.5 (L) 10/02/2017 1126   HCT 21.5 (L) 12/20/2020 0448   HCT 22.0 (L) 10/02/2017 1126   PLT 449 (H) 12/20/2020 0448   PLT 511 (H) 10/02/2017 1126   MCV 95.1 12/20/2020 0448   MCV 94 10/02/2017 1126   NEUTROABS 11.0 (H) 12/17/2020 0508   NEUTROABS 8.8 (H) 10/02/2017 1126   LYMPHSABS 2.6 12/17/2020 0508   LYMPHSABS 5.2 (H) 10/02/2017 1126   MONOABS 2.2 (H) 12/17/2020 0508   EOSABS 0.0 12/17/2020 0508   EOSABS 0.2 10/02/2017 1126   BASOSABS 0.0 12/17/2020 0508   BASOSABS 0.1 10/02/2017 1126    Recent Results (from the past 240 hour(s))  Resp Panel by RT-PCR (Flu A&B, Covid) Nasopharyngeal Swab     Status: None  Collection Time: 12/14/20  6:35 PM   Specimen: Nasopharyngeal Swab; Nasopharyngeal(NP) swabs in vial transport medium  Result Value Ref Range Status   SARS Coronavirus 2 by RT PCR NEGATIVE NEGATIVE Final    Comment: (NOTE) SARS-CoV-2 target nucleic acids are NOT DETECTED.  The SARS-CoV-2 RNA is generally detectable in upper respiratory specimens during the acute phase of infection. The lowest concentration of SARS-CoV-2 viral copies this assay can detect is 138 copies/mL. A negative result does not preclude SARS-Cov-2 infection and should not be used as the sole basis for treatment or other patient management decisions. A  negative result may occur with  improper specimen collection/handling, submission of specimen other than nasopharyngeal swab, presence of viral mutation(s) within the areas targeted by this assay, and inadequate number of viral copies(<138 copies/mL). A negative result must be combined with clinical observations, patient history, and epidemiological information. The expected result is Negative.  Fact Sheet for Patients:  EntrepreneurPulse.com.au  Fact Sheet for Healthcare Providers:  IncredibleEmployment.be  This test is no t yet approved or cleared by the [place] FDA and  has been authorized for detection and/or diagnosis of SARS-CoV-2 by FDA under an Emergency Use Authorization (EUA). This EUA will remain  in effect (meaning this test can be used) for the duration of the COVID-19 declaration under Section 564(b)(1) of the Act, 21 U.S.C.section 360bbb-3(b)(1), unless the authorization is terminated  or revoked sooner.       Influenza A by PCR NEGATIVE NEGATIVE Final   Influenza B by PCR NEGATIVE NEGATIVE Final    Comment: (NOTE) The Xpert Xpress SARS-CoV-2/FLU/RSV plus assay is intended as an aid in the diagnosis of influenza from Nasopharyngeal swab specimens and should not be used as a sole basis for treatment. Nasal washings and aspirates are unacceptable for Xpert Xpress SARS-CoV-2/FLU/RSV testing.  Fact Sheet for Patients: EntrepreneurPulse.com.au  Fact Sheet for Healthcare Providers: IncredibleEmployment.be  This test is not yet approved or cleared by the [place] FDA and has been authorized for detection and/or diagnosis of SARS-CoV-2 by FDA under an Emergency Use Authorization (EUA). This EUA will remain in effect (meaning this test can be used) for the duration of the COVID-19 declaration under Section 564(b)(1) of the Act, 21 U.S.C. section 360bbb-3(b)(1), unless the authorization  is terminated or revoked.  Performed at [facility], [place] [address] [place], [place] [address]     Studies/Results: DG Chest 2 View  Result Date: 12/20/2020 CLINICAL DATA:  Sickle cell crisis EXAM: CHEST - 2 VIEW COMPARISON:  December 14, 2020 FINDINGS: There is a small left pleural effusion. Lungs otherwise are clear. There is cardiomegaly with pulmonary vascularity normal. No adenopathy. Several thoracic spine endplate infarcts noted. 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 IMPRESSION: Small left pleural effusion.  Lungs otherwise clear. Cardiomegaly. No adenopathy. Bony changes indicative of sickle cell disease. Electronically Signed   By: Lowella Grip III M.D.   On: 12/20/2020 15:16    Medications: Scheduled Meds: . buPROPion  150 mg Oral Daily  . citalopram  40 mg Oral Daily  . enoxaparin (LOVENOX) injection  40 mg Subcutaneous Q24H  . folic acid  1 mg Oral Daily  . gabapentin  300 mg Oral TID  . oxyCODONE  10 mg Oral Q4H while awake  . senna-docusate  1 tablet Oral BID   Continuous Infusions: . sodium chloride 10 mL/hr at 12/18/20 1032   PRN Meds:.acetaminophen, HYDROmorphone (DILAUDID) injection, polyethylene glycol  Consultants:  None  Procedures:  None  Antibiotics:  None  Assessment/Plan: Principal Problem:   Sickle cell pain crisis ([place]) Active Problems:   Anemia of chronic disease   Depression   Polysubstance abuse ([place])   Tobacco abuse   Sickle cell disease with pain crisis: Discontinue IV Dilaudid PCA.  Scheduled oxycodone 10 mg every 4 hours while awake Patient has completed Toradol Dilaudid 2 mg IV every 4 hours as needed for severe breakthrough pain Monitor vital signs very closely, reevaluate pain scale regularly, and supplemental oxygen as needed. Patient encouraged to mobilize today  Leukocytosis: WBCs increased.  More than likely reactive.  Continue to monitor  closely.  He is afebrile.  Follow labs in AM.  Sickle cell anemia: Hemoglobin has improved to 7.3.  Patient is status post 2 units PRBCs.  Continue to follow closely.  CBC in AM.  Chronic pain syndrome: Continue home medications  Tobacco abuse: Patient counseled extensively.  Continue to follow closely.  History of depression and anxiety: Continue home medications.  He does not have any suicidal or homicidal thoughts.    Code Status: Full Code Family Communication: N/A Disposition Plan: Not yet ready for discharge  [place], MSN, FNP-C Patient [facility] [address] [place], [place] [address] [phone]  If 5PM-8AM, please contact night-coverage.  12/20/2020, 7:55 PM  LOS: 6 days

## 2020-12-21 LAB — BPAM RBC
Blood Product Expiration Date: 202204272359
Blood Product Expiration Date: 202204302359
Blood Product Expiration Date: 202205022359
ISSUE DATE / TIME: 202204041849
ISSUE DATE / TIME: 202204060030
Unit Type and Rh: 6200
Unit Type and Rh: 6200
Unit Type and Rh: 6200

## 2020-12-21 LAB — HCV RNA QUANT RFLX ULTRA OR GENOTYP
HCV RNA Qnt(log copy/mL): UNDETERMINED log10 IU/mL
HepC Qn: NOT DETECTED IU/mL

## 2020-12-21 LAB — TYPE AND SCREEN
ABO/RH(D): A POS
Antibody Screen: NEGATIVE
Unit division: 0
Unit division: 0
Unit division: 0

## 2020-12-22 NOTE — Progress Notes (Signed)
Subjective: 25 year old gentleman admitted with sickle cell painful crisis.  Patient has improved and has been off Dilaudid PCA.  At this point he is on oral agents.  Denied any fever or chills no nausea vomiting or diarrhea.  Objective: Vital signs in last 24 hours: Temp:  [98.8 F (37.1 C)-100.4 F (38 C)] 99.1 F (37.3 C) (04/09 1356) Pulse Rate:  [61-88] 67 (04/09 1356) Resp:  [14-18] 16 (04/09 1356) BP: (96-116)/(49-69) 96/49 (04/09 1356) SpO2:  [89 %-95 %] 94 % (04/09 1356) Weight:  [90.9 kg] 90.9 kg (04/09 0451) Weight change:  Last BM Date: 12/19/20  Intake/Output from previous day: 04/08 0701 - 04/09 0700 In: 240 [P.O.:240] Out: 1925 [Urine:1925] Intake/Output this shift: No intake/output data recorded.  General appearance: alert, cooperative and no distress Neck: no adenopathy, no carotid bruit, no JVD, supple, symmetrical, trachea midline and thyroid not enlarged, symmetric, no tenderness/mass/nodules Back: symmetric, no curvature. ROM normal. No CVA tenderness. Resp: clear to auscultation bilaterally Cardio: regular rate and rhythm, S1, S2 normal, no murmur, click, rub or gallop GI: soft, non-tender; bowel sounds normal; no masses,  no organomegaly Extremities: extremities normal, atraumatic, no cyanosis or edema Pulses: 2+ and symmetric Neurologic: Grossly normal  Lab Results: Recent Labs    12/20/20 0448  WBC 20.3*  HGB 7.3*  HCT 21.5*  PLT 449*   BMET No results for input(s): NA, K, CL, CO2, GLUCOSE, BUN, CREATININE, CALCIUM in the last 72 hours.  Studies/Results: DG Chest 2 View  Result Date: 12/20/2020 CLINICAL DATA:  Sickle cell crisis EXAM: CHEST - 2 VIEW COMPARISON:  December 14, 2020 FINDINGS: There is a small left pleural effusion. Lungs otherwise are clear. There is cardiomegaly with pulmonary vascularity normal. No adenopathy. Several thoracic spine endplate infarcts noted. Sclerosis noted in the humeral heads consistent with sickle cell disease  with developing avascular necrosis. IMPRESSION: Small left pleural effusion.  Lungs otherwise clear. Cardiomegaly. No adenopathy. Bony changes indicative of sickle cell disease. Electronically Signed   By: Lowella Grip III M.D.   On: 12/20/2020 15:16    Medications: I have reviewed the patient's current medications.  Assessment/Plan: A 25 year old gentleman admitted with sickle cell painful crisis.  #1 sickle cell painful crisis: Patient is much improved but still having significant pain.  Currently only on oral agents.  We will continue the oral agents and mobilize patient today.  Plan is possible discharge tomorrow.  #2 anemia of chronic disease: H&H has been stable.  Recheck in the morning.  #3 tobacco abuse: Counseling provided.  #4 polysubstance abuse: Counseling provided  LOS: 8 days   Gerell Fortson,LAWAL 12/22/2020, 2:37 PM

## 2020-12-23 ENCOUNTER — Other Ambulatory Visit: Payer: Self-pay | Admitting: Family Medicine

## 2020-12-23 DIAGNOSIS — F325 Major depressive disorder, single episode, in full remission: Secondary | ICD-10-CM

## 2020-12-23 DIAGNOSIS — D5700 Hb-SS disease with crisis, unspecified: Secondary | ICD-10-CM

## 2020-12-23 LAB — COMPREHENSIVE METABOLIC PANEL
ALT: 23 U/L (ref 0–44)
AST: 17 U/L (ref 15–41)
Albumin: 3.2 g/dL — ABNORMAL LOW (ref 3.5–5.0)
Alkaline Phosphatase: 98 U/L (ref 38–126)
Anion gap: 10 (ref 5–15)
BUN: 10 mg/dL (ref 6–20)
CO2: 25 mmol/L (ref 22–32)
Calcium: 9 mg/dL (ref 8.9–10.3)
Chloride: 102 mmol/L (ref 98–111)
Creatinine, Ser: 0.98 mg/dL (ref 0.61–1.24)
GFR, Estimated: >60 mL/min (ref >60)
Glucose, Bld: 101 mg/dL — ABNORMAL HIGH (ref 70–99)
Potassium: 4.3 mmol/L (ref 3.5–5.1)
Sodium: 137 mmol/L (ref 135–145)
Total Bilirubin: 1.6 mg/dL — ABNORMAL HIGH (ref 0.3–1.2)
Total Protein: 7.4 g/dL (ref 6.5–8.1)

## 2020-12-23 LAB — DIFFERENTIAL
Abs Immature Granulocytes: 0.19 10*3/uL — ABNORMAL HIGH (ref 0.00–0.07)
Basophils Absolute: 0.1 10*3/uL (ref 0.0–0.1)
Basophils Relative: 0 %
Eosinophils Absolute: 0.4 10*3/uL (ref 0.0–0.5)
Eosinophils Relative: 2 %
Immature Granulocytes: 1 %
Lymphocytes Relative: 19 %
Lymphs Abs: 2.9 10*3/uL (ref 0.7–4.0)
Monocytes Absolute: 1.8 10*3/uL — ABNORMAL HIGH (ref 0.1–1.0)
Monocytes Relative: 11 %
Neutro Abs: 10.4 10*3/uL — ABNORMAL HIGH (ref 1.7–7.7)
Neutrophils Relative %: 67 %

## 2020-12-23 LAB — CBC
HCT: 22.3 % — ABNORMAL LOW (ref 39.0–52.0)
Hemoglobin: 7.2 g/dL — ABNORMAL LOW (ref 13.0–17.0)
MCH: 30.6 pg (ref 26.0–34.0)
MCHC: 32.3 g/dL (ref 30.0–36.0)
MCV: 94.9 fL (ref 80.0–100.0)
Platelets: 724 10*3/uL — ABNORMAL HIGH (ref 150–400)
RBC: 2.35 MIL/uL — ABNORMAL LOW (ref 4.22–5.81)
RDW: 19.9 % — ABNORMAL HIGH (ref 11.5–15.5)
WBC: 15.7 10*3/uL — ABNORMAL HIGH (ref 4.0–10.5)
nRBC: 13.6 % — ABNORMAL HIGH (ref 0.0–0.2)

## 2020-12-23 MED ORDER — BUPROPION HCL ER (XL) 150 MG PO TB24: 150.0000 mg | ORAL_TABLET | Freq: Every day | ORAL | 0 refills | Status: DC

## 2020-12-23 MED ORDER — OXYCODONE-ACETAMINOPHEN 5-325 MG PO TABS: 1.0000 | ORAL_TABLET | Freq: Four times a day (QID) | ORAL | 0 refills | Status: DC | PRN

## 2020-12-23 MED ORDER — NYSTATIN 100000 UNIT/ML MT SUSP: 5.0000 mL | Freq: Four times a day (QID) | OROMUCOSAL | 0 refills | Status: DC

## 2020-12-23 MED ORDER — CITALOPRAM HYDROBROMIDE 40 MG PO TABS: 40.0000 mg | ORAL_TABLET | Freq: Every day | ORAL | 0 refills | Status: DC

## 2020-12-23 NOTE — Discharge Summary (Signed)
Physician Discharge Summary  Patient ID: Jeffrey Morrison MRN: [id number] DOB/AGE: Jan 22, 1996 25 y.o.  Admit date: 12/14/2020 Discharge date: 12/23/2020  Admission Diagnoses:  Discharge Diagnoses:  Principal Problem:   Sickle cell pain crisis ([place]) Active Problems:   Anemia of chronic disease   Depression   Polysubstance abuse ([place])   Tobacco abuse   Discharged Condition: good  Hospital Course: Patient is a 25 year old gentleman with history of sickle cell disease who was just released from prison came to the ER with significant pain consistent with his sickle cell crisis.  Patient also has history of polysubstance abuse including tobacco abuse as well as anemia of chronic disease.  Treated initially with IV Dilaudid PCA and then pushes.  Later transitioned to oral therapy.  He did much better.  Patient subsequently discharged home with his oral therapy of oxycodone.  He has history of tobacco abuse and polysubstance abuse but has not used them since he has been in prison.  Counseling provided at time of discharge to continue to stay away from them.  Consults: None  Significant Diagnostic Studies: labs: Serial CBCs and CMP is checked.  No significant distress.  Treatments: IV hydration and analgesia: acetaminophen and Dilaudid  Discharge Exam: Blood pressure (!) 96/55, pulse 66, temperature 98.6 F (37 C), temperature source Oral, resp. rate 14, height 6' (1.829 m), weight 90.5 kg, SpO2 93 %. General appearance: alert, cooperative, appears stated age and no distress Neck: no adenopathy, no carotid bruit, no JVD, supple, symmetrical, trachea midline and thyroid not enlarged, symmetric, no tenderness/mass/nodules Back: symmetric, no curvature. ROM normal. No CVA tenderness. Resp: clear to auscultation bilaterally Cardio: regular rate and rhythm, S1, S2 normal, no murmur, click, rub or gallop GI: soft, non-tender; bowel sounds normal; no masses,  no organomegaly Extremities:  extremities normal, atraumatic, no cyanosis or edema Pulses: 2+ and symmetric Skin: Skin color, texture, turgor normal. No rashes or lesions Neurologic: Grossly normal  Disposition: Discharge disposition: 01-Home or Self Care       Discharge Instructions    Diet - low sodium heart healthy   Complete by: As directed    Increase activity slowly   Complete by: As directed        Follow-up [facility] Follow up.   Specialty: Internal Medicine Why: 4/13 @9 :20 Contact information: [place] [address] [phone]              Signed: Barbette Merino 12/23/2020, 9:02 AM   Time spent 34 minutes

## 2020-12-23 NOTE — Progress Notes (Addendum)
Spoke with [employer] and he stated The patient doesn't have a legal guardian. Kule does live with his Counselling psychologist and Bari Mantis. Discharge instructions explained to Patient. Educational discharge instructions given to [place]. IV discontinued. Patient states his pain is a 5.

## 2020-12-23 NOTE — Progress Notes (Signed)
Reviewed PDMP substance reporting system prior to prescribing opiate medications. No inconsistencies noted.   Meds ordered this encounter  Medications  . citalopram (CELEXA) 40 MG tablet    Sig: Take 1 tablet (40 mg total) by mouth daily.    Dispense:  30 tablet    Refill:  0    Order Specific Question:   Supervising Provider    Answer:   [name] [id number]  . buPROPion (WELLBUTRIN XL) 150 MG 24 hr tablet    Sig: Take 1 tablet (150 mg total) by mouth daily.    Dispense:  30 tablet    Refill:  0    Order Specific Question:   Supervising Provider    Answer:   [name] [id number]  . oxyCODONE-acetaminophen (PERCOCET/ROXICET) 5-325 MG tablet    Sig: Take 1 tablet by mouth every 6 (six) hours as needed for severe pain.    Dispense:  30 tablet    Refill:  0    Order Specific Question:   Supervising Provider    Answer:   [name] [[id number]]    Donia Pounds  APRN, MSN, FNP-C Patient [facility] [address] [place], [place] [address] [phone]

## 2020-12-25 LAB — CULTURE, BLOOD (ROUTINE X 2)
Culture: NO GROWTH
Culture: NO GROWTH
Special Requests: ADEQUATE
Special Requests: ADEQUATE

## 2020-12-26 ENCOUNTER — Ambulatory Visit: Payer: Self-pay | Admitting: Nurse Practitioner

## 2021-01-29 ENCOUNTER — Ambulatory Visit: Payer: Self-pay | Admitting: Family Medicine

## 2021-02-01 ENCOUNTER — Encounter (HOSPITAL_COMMUNITY): Payer: Self-pay | Admitting: Emergency Medicine

## 2021-02-01 ENCOUNTER — Other Ambulatory Visit: Payer: Self-pay

## 2021-02-01 ENCOUNTER — Emergency Department (HOSPITAL_COMMUNITY)
Admission: EM | Admit: 2021-02-01 | Discharge: 2021-02-01 | Disposition: A | Discharge status: Home / Self Care | Payer: Medicaid Other | Attending: Emergency Medicine | Admitting: Emergency Medicine

## 2021-02-01 DIAGNOSIS — F17210 Nicotine dependence, cigarettes, uncomplicated: Secondary | ICD-10-CM | POA: Diagnosis not present

## 2021-02-01 DIAGNOSIS — D5700 Hb-SS disease with crisis, unspecified: Secondary | ICD-10-CM

## 2021-02-01 DIAGNOSIS — R109 Unspecified abdominal pain: Secondary | ICD-10-CM | POA: Insufficient documentation

## 2021-02-01 DIAGNOSIS — R21 Rash and other nonspecific skin eruption: Secondary | ICD-10-CM | POA: Diagnosis not present

## 2021-02-01 LAB — CBC WITH DIFFERENTIAL/PLATELET
Abs Immature Granulocytes: 0.06 10*3/uL (ref 0.00–0.07)
Basophils Absolute: 0 10*3/uL (ref 0.0–0.1)
Basophils Relative: 0 %
Eosinophils Absolute: 0.4 10*3/uL (ref 0.0–0.5)
Eosinophils Relative: 3 %
HCT: 23.8 % — ABNORMAL LOW (ref 39.0–52.0)
Hemoglobin: 8.3 g/dL — ABNORMAL LOW (ref 13.0–17.0)
Immature Granulocytes: 1 %
Lymphocytes Relative: 22 %
Lymphs Abs: 2.6 10*3/uL (ref 0.7–4.0)
MCH: 35 pg — ABNORMAL HIGH (ref 26.0–34.0)
MCHC: 34.9 g/dL (ref 30.0–36.0)
MCV: 100.4 fL — ABNORMAL HIGH (ref 80.0–100.0)
Monocytes Absolute: 0.8 10*3/uL (ref 0.1–1.0)
Monocytes Relative: 7 %
Neutro Abs: 7.8 10*3/uL — ABNORMAL HIGH (ref 1.7–7.7)
Neutrophils Relative %: 67 %
Platelets: 402 10*3/uL — ABNORMAL HIGH (ref 150–400)
RBC: 2.37 MIL/uL — ABNORMAL LOW (ref 4.22–5.81)
RDW: 23.9 % — ABNORMAL HIGH (ref 11.5–15.5)
WBC: 11.7 10*3/uL — ABNORMAL HIGH (ref 4.0–10.5)
nRBC: 6.1 % — ABNORMAL HIGH (ref 0.0–0.2)

## 2021-02-01 LAB — COMPREHENSIVE METABOLIC PANEL
ALT: 20 U/L (ref 0–44)
AST: 33 U/L (ref 15–41)
Albumin: 3.9 g/dL (ref 3.5–5.0)
Alkaline Phosphatase: 74 U/L (ref 38–126)
Anion gap: 5 (ref 5–15)
BUN: 13 mg/dL (ref 6–20)
CO2: 25 mmol/L (ref 22–32)
Calcium: 8.8 mg/dL — ABNORMAL LOW (ref 8.9–10.3)
Chloride: 109 mmol/L (ref 98–111)
Creatinine, Ser: 0.99 mg/dL (ref 0.61–1.24)
GFR, Estimated: >60 mL/min (ref >60)
Glucose, Bld: 99 mg/dL (ref 70–99)
Potassium: 4.3 mmol/L (ref 3.5–5.1)
Sodium: 139 mmol/L (ref 135–145)
Total Bilirubin: 2.3 mg/dL — ABNORMAL HIGH (ref 0.3–1.2)
Total Protein: 6.7 g/dL (ref 6.5–8.1)

## 2021-02-01 LAB — RETICULOCYTES
Immature Retic Fract: 32.6 % — ABNORMAL HIGH (ref 2.3–15.9)
RBC.: 2.36 MIL/uL — ABNORMAL LOW (ref 4.22–5.81)
Retic Count, Absolute: 234 10*3/uL — ABNORMAL HIGH (ref 19.0–186.0)
Retic Ct Pct: 10 % — ABNORMAL HIGH (ref 0.4–3.1)

## 2021-02-01 MED ORDER — HYDROMORPHONE HCL 2 MG/ML IJ SOLN
2.0000 mg | Freq: Once | INTRAMUSCULAR | Status: AC
  Administered 2021-02-01: 2 mg via INTRAVENOUS

## 2021-02-01 MED ORDER — HYDROMORPHONE HCL 1 MG/ML IJ SOLN
1.0000 mg | Freq: Once | INTRAMUSCULAR | Status: AC
  Administered 2021-02-01: 1 mg via INTRAVENOUS
  Filled 2021-02-01: qty 1

## 2021-02-01 MED ORDER — HYDROCORTISONE 1 % EX CREA
TOPICAL_CREAM | Freq: Two times a day (BID) | CUTANEOUS | Status: DC
  Filled 2021-02-01: qty 28

## 2021-02-01 MED ORDER — HYDROMORPHONE HCL 2 MG/ML IJ SOLN: 2.0000 mg | Freq: Once | INTRAMUSCULAR | Status: DC

## 2021-02-01 MED ORDER — SODIUM CHLORIDE 0.45 % IV SOLN: INTRAVENOUS | Status: DC

## 2021-02-01 MED ORDER — OXYCODONE-ACETAMINOPHEN 5-325 MG PO TABS
1.0000 | ORAL_TABLET | Freq: Once | ORAL | Status: AC
  Administered 2021-02-01: 1 via ORAL
  Filled 2021-02-01: qty 1

## 2021-02-01 MED ORDER — HYDROMORPHONE HCL 2 MG/ML IJ SOLN: 2.0000 mg | INTRAMUSCULAR | Status: DC

## 2021-02-01 MED ORDER — PERMETHRIN 5 % EX CREA: TOPICAL_CREAM | CUTANEOUS | 1 refills | Status: DC

## 2021-02-01 MED ORDER — HYDROMORPHONE HCL 2 MG/ML IJ SOLN
2.0000 mg | INTRAMUSCULAR | Status: DC
  Filled 2021-02-01: qty 1

## 2021-02-01 MED ORDER — PERMETHRIN 5 % EX CREA: TOPICAL_CREAM | CUTANEOUS | 0 refills | Status: DC

## 2021-02-01 MED ORDER — DIPHENHYDRAMINE HCL 50 MG/ML IJ SOLN
12.5000 mg | Freq: Once | INTRAMUSCULAR | Status: AC
  Administered 2021-02-01: 12.5 mg via INTRAVENOUS
  Filled 2021-02-01: qty 1

## 2021-02-01 MED ORDER — DIPHENHYDRAMINE HCL 50 MG/ML IJ SOLN
25.0000 mg | Freq: Once | INTRAMUSCULAR | Status: AC
  Administered 2021-02-01: 25 mg via INTRAVENOUS
  Filled 2021-02-01: qty 1

## 2021-02-01 MED ORDER — KETOROLAC TROMETHAMINE 15 MG/ML IJ SOLN
15.0000 mg | INTRAMUSCULAR | Status: AC
  Administered 2021-02-01: 15 mg via INTRAVENOUS
  Filled 2021-02-01: qty 1

## 2021-02-01 NOTE — Discharge Instructions (Addendum)
You were seen today for scabies and sick cell pain.  For scabies, please pick up permethrin  cream. You will need to apply it thoroughly into the skin from your neck to the soles of your feet including the areas under your finger nails. Leave it on for 8-14 hours and then wash it off in the shower. Do the same thing in one to 2 weeks later to fully eliminate the mites.   Please call to establish care with sickle cell clinic for future care and management of your sickle cell disease.

## 2021-02-01 NOTE — ED Provider Notes (Signed)
[place] DEPT Provider Note   CSN: [id number] Arrival date & time: 02/01/21  1306     History Chief Complaint  Patient presents with  . Sickle Cell Pain Crisis  . Flank Pain  . Rash    Jeffrey Morrison is a 25 y.o. male.  HPI   Patient with history of sick cell presents with acute pain and rash x 3 days. He was released from prison two weeks ago and has not had his home medicine. He states the pain is cost prohibitive.   Pain - pain started yesterday. It is located to his left flank and states this mimics his usual pain crisis. He doesn't know when his last opioid dose was. He endorses diarrhea x once yesterday and nausea. He does not have vomiting, bloody stool or constipation. No chest pain, no SOB.  Rash - presented 3 days ago. Started on his wrists bilaterally and moved up his arms. It involves flexor and extensor sides of the arms. It has spread to his abdomen, both legs and to his back. None on his face or in his mouth. No fevers, chills, or pus. Rash is pruritic but nonpainful. He has not been working outside. No one else has it at home. His sister has a bunny but he denies any bite from the rabbit.   Past Medical History:  Diagnosis Date  . Chronic lower back pain   . Depression   . Sickle cell crisis ([place])   . Sickle cell disease [facility])     Patient Active Problem List   Diagnosis Date Noted  . Transaminitis 11/12/2018  . Tobacco abuse 11/12/2018  . LFT elevation   . Total bilirubin, elevated   . Low oxygen saturation   . Agitation   . Community acquired pneumonia   . Polysubstance abuse ([place]) 07/12/2018  . Homelessness 07/12/2018  . Wheezing on expiration 07/12/2018  . Cough 07/12/2018  . Dysuria 07/12/2018  . Hb-SS disease with crisis ([place])   . Wheezing   . Sickle cell crisis ([place]) 07/10/2018  . Sickle cell pain crisis ([place]) 04/21/2018  . CMV (cytomegalovirus) ([place]) 08/27/2017  . Erectile dysfunction 08/27/2017  .  Depression 08/25/2017  . OSA (obstructive sleep apnea)   . Anemia of chronic disease     Past Surgical History:  Procedure Laterality Date  . SPLENECTOMY         Family History  Problem Relation Age of Onset  . Diabetes Mother     Social History   Tobacco Use  . Smoking status: Current Every Day Smoker    Packs/day: 0.50    Types: Cigarettes  . Smokeless tobacco: Never Used  Vaping Use  . Vaping Use: Never used  Substance Use Topics  . Alcohol use: No  . Drug use: Yes    Types: IV, Methamphetamines    Comment: heroin     Home Medications Prior to Admission medications   Medication Sig Start Date End Date Taking? Authorizing Provider  buPROPion (WELLBUTRIN XL) 150 MG 24 hr tablet Take 1 tablet (150 mg total) by mouth daily. 12/23/20   Dorena Dew, FNP  citalopram (CELEXA) 40 MG tablet Take 1 tablet (40 mg total) by mouth daily. 12/23/20   Dorena Dew, FNP  elvitegravir-cobicistat-emtricitabine-tenofovir (GENVOYA) 150-150-200-10 MG TABS tablet Take 1 tablet by mouth daily with breakfast. Patient not taking: Reported on 12/14/2020 10/05/18   Robinson, [place] N, PA-C  hydroxyurea (HYDREA) 500 MG capsule Take 1 capsule (500 mg total)  by mouth 2 (two) times daily. May take with food to minimize GI side effects. 07/16/18   Dorena Dew, FNP  nystatin (MYCOSTATIN) 100000 UNIT/ML suspension Take 5 mLs (500,000 Units total) by mouth 4 (four) times daily. 12/23/20   Elwyn Reach, MD  oxyCODONE-acetaminophen (PERCOCET/ROXICET) 5-325 MG tablet Take 1 tablet by mouth every 6 (six) hours as needed for severe pain. 12/23/20   Dorena Dew, FNP    Allergies    Patient has no known allergies.  Review of Systems   Review of Systems  Constitutional: Negative for chills and fever.  HENT: Negative for ear pain and sore throat.   Eyes: Negative for pain and visual disturbance.  Respiratory: Negative for cough and shortness of breath.   Cardiovascular: Negative for  chest pain and palpitations.  Gastrointestinal: Positive for diarrhea and nausea. Negative for abdominal pain and vomiting.  Genitourinary: Positive for flank pain. Negative for dysuria and hematuria.  Musculoskeletal: Negative for arthralgias and back pain.  Skin: Negative for color change and rash.  Neurological: Negative for seizures and syncope.  Psychiatric/Behavioral: Negative for suicidal ideas.  All other systems reviewed and are negative.   Physical Exam Updated Vital Signs BP (!) 110/59   Pulse 74   Temp 99.4 F (37.4 C) (Oral)   Resp 18   Ht 5\' 9"  (1.753 m)   Wt 88 kg   SpO2 95%   BMI 28.63 kg/m   Physical Exam Vitals and nursing note reviewed. Exam conducted with a chaperone present.  Constitutional:      Appearance: Normal appearance.  HENT:     Head: Normocephalic and atraumatic.     Mouth/Throat:     Comments: Poor dentition. Front tooth missing.  Eyes:     General: No scleral icterus.       Right eye: No discharge.        Left eye: No discharge.     Extraocular Movements: Extraocular movements intact.     Pupils: Pupils are equal, round, and reactive to light.  Cardiovascular:     Rate and Rhythm: Normal rate and regular rhythm.     Pulses: Normal pulses.     Heart sounds: Normal heart sounds. No murmur heard. No friction rub. No gallop.   Pulmonary:     Effort: Pulmonary effort is normal. No respiratory distress.     Breath sounds: Normal breath sounds.  Abdominal:     General: Abdomen is flat. Bowel sounds are normal. There is no distension.     Palpations: Abdomen is soft.     Tenderness: There is abdominal tenderness. There is no left CVA tenderness.     Comments: L. Flank pain. No CVAT   Skin:    General: Skin is warm and dry.     Coloration: Skin is not jaundiced.     Findings: Rash present.     Comments: Self inflicted knife cuts noted to left forearm. Scars are old.   Pruritic macules in clusters on arms, legs, abdomen and back. Not on  the face. Not periorbital.   Neurological:     Mental Status: He is alert. Mental status is at baseline.     Coordination: Coordination normal.     ED Results / Procedures / Treatments   Labs (all labs ordered are listed, but only abnormal results are displayed) Labs Reviewed - No data to display  EKG None  Radiology No results found.  Procedures Procedures   Medications Ordered in ED Medications -  No data to display  ED Course  I have reviewed the triage vital signs and the nursing notes.  Pertinent labs & imaging results that were available during my care of the patient were reviewed by me and considered in my medical decision making (see chart for details).    MDM Rules/Calculators/A&P                          Patient presents with rash and sick cell pain crisis. I've started sickle cell pain workup. Suspect rash is likely scabies. Doubt SJS, TEN, or meningitis.  Plan to evaluate patient for underlying infection and to control pain here in the ED. Patient care transferred to [facility] Recommendations to follow.   Final Clinical Impression(s) / ED Diagnoses Final diagnoses:  None    Rx / DC Orders ED Discharge Orders    None       Sherrill Raring, [name] Coria 02/01/21 1548    Charlesetta Shanks, MD 02/26/21 2204

## 2021-02-01 NOTE — ED Provider Notes (Signed)
  I saw this patient in partnership with colleague Sherrill Raring, PA-C.  In short, patient is complaining of pain in the right flank consistent with pain he associates with sickle cell. Patient was released from prison 2 weeks ago where he had been incarcerated for 4 years.  He also notes a pruritic rash that started 2 days ago on the dorsal side of his left hand and has since spread to both upper extremities, chest, and back.  He also has itching between his fingers.            Patient is nontoxic appearing, afebrile, not tachycardic, not tachypneic, not hypotensive, maintains excellent SPO2 on room air, and is in no apparent distress.   Suspect his rash may be scabies.  We will prescribe outpatient treatment accordingly.  At the end of her shift, patient care was handed off to [employer], PA-C. Plan: Improved patient pain.  Discharge.  Patient to follow-up with sickle cell clinic.   Vitals:   02/01/21 1515 02/01/21 1530 02/01/21 1545 02/01/21 1600  BP:  116/69  117/68  Pulse: 70 67 65 74  Resp:  16 (!) 26 (!) 23  Temp:      TempSrc:      SpO2: 96% 96% 95% 97%  Weight:      Height:                Layla Maw 02/01/21 1632    Charlesetta Shanks, MD 02/26/21 2204

## 2021-02-01 NOTE — ED Triage Notes (Signed)
Patient c/o L flank pain x1 week. Reports pain mimics typical sickle cell pain. Patient also c/o itching rash to body x3 days.   Patient provided phone number for Butch Penny, legal guardian. Made aware patient is in department to be seen and requests call at time of discharge. [phone]

## 2021-02-01 NOTE — ED Notes (Signed)
Called Butch Penny, patient's guardian, for notification of d/c; reviewed d/c instructions with Butch Penny, She verbalized understanding of instructions and denies any further questions.

## 2021-02-01 NOTE — ED Provider Notes (Signed)
Care transferred from previous provider [place], [place].  See note for full HPI.  In summation 25 year old history of sickle cell presents for evaluation of pain which she feels is consistent with his prior sickle cell crisis.  No chest pain, shortness of breath.  He is not currently followed by sickle cell clinic.  No emesis, chest pain, shortness of breath, hemoptysis.  No clinical evidence of DVT on exam.  Patient also with a rash consistent with scabies.  Prescribed outpatient medications.  Plan on follow-up on labs, pain control.  Suspect DC home   Physical Exam  BP 125/66   Pulse 65   Temp 99.4 F (37.4 C) (Oral)   Resp 18   Ht 5\' 9"  (1.753 m)   Wt 88 kg   SpO2 90%   BMI 28.63 kg/m   Physical Exam Vitals and nursing note reviewed.  Constitutional:      General: He is not in acute distress.    Appearance: He is well-developed. He is not ill-appearing, toxic-appearing or diaphoretic.     Comments: Texting on phone.  Itching.  Appears comfortable  HENT:     Head: Atraumatic.  Eyes:     Pupils: Pupils are equal, round, and reactive to light.  Cardiovascular:     Rate and Rhythm: Normal rate and regular rhythm.  Pulmonary:     Effort: Pulmonary effort is normal. No respiratory distress.  Abdominal:     General: There is no distension.     Palpations: Abdomen is soft.  Musculoskeletal:        General: Normal range of motion.     Cervical back: Normal range of motion and neck supple.  Skin:    General: Skin is warm and dry.  Neurological:     Mental Status: He is alert.     ED Course/Procedures   Clinical Course as of 02/01/21 1815  Fri Feb 01, 2021  1612 Patient with history of sickle cell has recently been incarcerated and has been out of his medications.  Pain generalized in nature.  No chest pain, shortness of breath.  Per previous provider no concern for acute chest syndrome.  Plan to follow-up on labs, pain management [BH]    Clinical Course User Index [BH]  Haillie Radu A, PA-C    Procedures Labs Reviewed  CBC WITH DIFFERENTIAL/PLATELET - Abnormal; Notable for the following components:      Result Value   WBC 11.7 (*)    RBC 2.37 (*)    Hemoglobin 8.3 (*)    HCT 23.8 (*)    MCV 100.4 (*)    MCH 35.0 (*)    RDW 23.9 (*)    Platelets 402 (*)    nRBC 6.1 (*)    Neutro Abs 7.8 (*)    All other components within normal limits  COMPREHENSIVE METABOLIC PANEL - Abnormal; Notable for the following components:   Calcium 8.8 (*)    Total Bilirubin 2.3 (*)    All other components within normal limits  RETICULOCYTES - Abnormal; Notable for the following components:   Retic Ct Pct 10.0 (*)    RBC. 2.36 (*)    Retic Count, Absolute 234.0 (*)    Immature Retic Fract 32.6 (*)    All other components within normal limits  URINALYSIS, ROUTINE W REFLEX MICROSCOPIC  No results found. MDM  Plan on follow-up on pain control, labs.  Anticipate DC home  Patient reassessed.  Pain controlled.  Labs without significant abnormality.  He is  tolerating p.o. intake.  He is denying any chest pain, shortness of breath, respiratory complaints.  Plan to DC home follow-up with sickle cell clinic.      Daryel Kenneth A, PA-C 02/01/21 1816    Lajean Saver, MD 02/01/21 2355

## 2021-02-13 ENCOUNTER — Encounter (HOSPITAL_COMMUNITY): Payer: Self-pay

## 2021-02-13 ENCOUNTER — Inpatient Hospital Stay (HOSPITAL_COMMUNITY)
Admission: EM | Admit: 2021-02-13 | Discharge: 2021-02-20 | DRG: 812 | Disposition: A | Discharge status: Home / Self Care | Payer: Self-pay | Attending: Internal Medicine | Admitting: Internal Medicine

## 2021-02-13 ENCOUNTER — Other Ambulatory Visit: Payer: Self-pay

## 2021-02-13 DIAGNOSIS — Z79899 Other long term (current) drug therapy: Secondary | ICD-10-CM

## 2021-02-13 DIAGNOSIS — F17210 Nicotine dependence, cigarettes, uncomplicated: Secondary | ICD-10-CM | POA: Diagnosis present

## 2021-02-13 DIAGNOSIS — Z20822 Contact with and (suspected) exposure to covid-19: Secondary | ICD-10-CM | POA: Diagnosis present

## 2021-02-13 DIAGNOSIS — D5700 Hb-SS disease with crisis, unspecified: Principal | ICD-10-CM | POA: Diagnosis present

## 2021-02-13 DIAGNOSIS — M545 Low back pain, unspecified: Secondary | ICD-10-CM | POA: Diagnosis present

## 2021-02-13 DIAGNOSIS — F325 Major depressive disorder, single episode, in full remission: Secondary | ICD-10-CM

## 2021-02-13 DIAGNOSIS — Z720 Tobacco use: Secondary | ICD-10-CM | POA: Diagnosis present

## 2021-02-13 DIAGNOSIS — D638 Anemia in other chronic diseases classified elsewhere: Secondary | ICD-10-CM | POA: Diagnosis present

## 2021-02-13 DIAGNOSIS — Z5901 Sheltered homelessness: Secondary | ICD-10-CM

## 2021-02-13 DIAGNOSIS — F329 Major depressive disorder, single episode, unspecified: Secondary | ICD-10-CM | POA: Diagnosis present

## 2021-02-13 DIAGNOSIS — Z833 Family history of diabetes mellitus: Secondary | ICD-10-CM

## 2021-02-13 DIAGNOSIS — Z9081 Acquired absence of spleen: Secondary | ICD-10-CM

## 2021-02-13 DIAGNOSIS — G894 Chronic pain syndrome: Secondary | ICD-10-CM | POA: Diagnosis present

## 2021-02-13 DIAGNOSIS — R45851 Suicidal ideations: Secondary | ICD-10-CM | POA: Diagnosis present

## 2021-02-13 DIAGNOSIS — F32A Depression, unspecified: Secondary | ICD-10-CM | POA: Diagnosis present

## 2021-02-13 DIAGNOSIS — F419 Anxiety disorder, unspecified: Secondary | ICD-10-CM | POA: Diagnosis present

## 2021-02-13 LAB — CBC WITH DIFFERENTIAL/PLATELET
Abs Immature Granulocytes: 0.16 10*3/uL — ABNORMAL HIGH (ref 0.00–0.07)
Basophils Absolute: 0.1 10*3/uL (ref 0.0–0.1)
Basophils Relative: 1 %
Eosinophils Absolute: 0.4 10*3/uL (ref 0.0–0.5)
Eosinophils Relative: 2 %
HCT: 23 % — ABNORMAL LOW (ref 39.0–52.0)
Hemoglobin: 7.9 g/dL — ABNORMAL LOW (ref 13.0–17.0)
Immature Granulocytes: 1 %
Lymphocytes Relative: 18 %
Lymphs Abs: 2.9 10*3/uL (ref 0.7–4.0)
MCH: 35.4 pg — ABNORMAL HIGH (ref 26.0–34.0)
MCHC: 34.3 g/dL (ref 30.0–36.0)
MCV: 103.1 fL — ABNORMAL HIGH (ref 80.0–100.0)
Monocytes Absolute: 1.5 10*3/uL — ABNORMAL HIGH (ref 0.1–1.0)
Monocytes Relative: 9 %
Neutro Abs: 11.1 10*3/uL — ABNORMAL HIGH (ref 1.7–7.7)
Neutrophils Relative %: 69 %
Platelets: 356 10*3/uL (ref 150–400)
RBC: 2.23 MIL/uL — ABNORMAL LOW (ref 4.22–5.81)
RDW: 23.4 % — ABNORMAL HIGH (ref 11.5–15.5)
WBC: 16.1 10*3/uL — ABNORMAL HIGH (ref 4.0–10.5)
nRBC: 16.6 % — ABNORMAL HIGH (ref 0.0–0.2)

## 2021-02-13 LAB — RETICULOCYTES
Immature Retic Fract: 38.5 % — ABNORMAL HIGH (ref 2.3–15.9)
RBC.: 2.18 MIL/uL — ABNORMAL LOW (ref 4.22–5.81)
Retic Count, Absolute: 327.9 10*3/uL — ABNORMAL HIGH (ref 19.0–186.0)
Retic Ct Pct: 15 % — ABNORMAL HIGH (ref 0.4–3.1)

## 2021-02-13 LAB — COMPREHENSIVE METABOLIC PANEL
ALT: 18 U/L (ref 0–44)
AST: 28 U/L (ref 15–41)
Albumin: 4.2 g/dL (ref 3.5–5.0)
Alkaline Phosphatase: 77 U/L (ref 38–126)
Anion gap: 5 (ref 5–15)
BUN: 15 mg/dL (ref 6–20)
CO2: 25 mmol/L (ref 22–32)
Calcium: 8.8 mg/dL — ABNORMAL LOW (ref 8.9–10.3)
Chloride: 106 mmol/L (ref 98–111)
Creatinine, Ser: 0.96 mg/dL (ref 0.61–1.24)
GFR, Estimated: >60 mL/min (ref >60)
Glucose, Bld: 101 mg/dL — ABNORMAL HIGH (ref 70–99)
Potassium: 3.7 mmol/L (ref 3.5–5.1)
Sodium: 136 mmol/L (ref 135–145)
Total Bilirubin: 2.8 mg/dL — ABNORMAL HIGH (ref 0.3–1.2)
Total Protein: 7.8 g/dL (ref 6.5–8.1)

## 2021-02-13 LAB — RESP PANEL BY RT-PCR (FLU A&B, COVID) ARPGX2
Influenza A by PCR: NEGATIVE
Influenza B by PCR: NEGATIVE
SARS Coronavirus 2 by RT PCR: NEGATIVE

## 2021-02-13 MED ORDER — CITALOPRAM HYDROBROMIDE 20 MG PO TABS
40.0000 mg | ORAL_TABLET | Freq: Every day | ORAL | Status: DC
  Administered 2021-02-13 – 2021-02-20 (×7): 40 mg via ORAL
  Filled 2021-02-13 (×8): qty 2

## 2021-02-13 MED ORDER — DEXTROSE-NACL 5-0.45 % IV SOLN: INTRAVENOUS | Status: DC

## 2021-02-13 MED ORDER — OXYCODONE-ACETAMINOPHEN 5-325 MG PO TABS
1.0000 | ORAL_TABLET | Freq: Four times a day (QID) | ORAL | Status: DC | PRN
  Administered 2021-02-13 – 2021-02-15 (×4): 1 via ORAL
  Filled 2021-02-13 (×4): qty 1

## 2021-02-13 MED ORDER — HYDROMORPHONE HCL 2 MG/ML IJ SOLN
2.0000 mg | INTRAMUSCULAR | Status: DC | PRN
Start: 2021-02-13 — End: 2021-02-14
  Administered 2021-02-13: 2 mg via INTRAVENOUS
  Filled 2021-02-13: qty 1

## 2021-02-13 MED ORDER — KETOROLAC TROMETHAMINE 15 MG/ML IJ SOLN
15.0000 mg | INTRAMUSCULAR | Status: AC
  Administered 2021-02-13: 15 mg via INTRAVENOUS
  Filled 2021-02-13: qty 1

## 2021-02-13 MED ORDER — HYDROMORPHONE HCL 2 MG/ML IJ SOLN
2.0000 mg | INTRAMUSCULAR | Status: AC
Start: 2021-02-13 — End: 2021-02-13
  Administered 2021-02-13: 2 mg via INTRAVENOUS
  Filled 2021-02-13: qty 1

## 2021-02-13 MED ORDER — ELVITEG-COBIC-EMTRICIT-TENOFAF 150-150-200-10 MG PO TABS
1.0000 | ORAL_TABLET | Freq: Every day | ORAL | Status: DC
  Administered 2021-02-14: 1 via ORAL
  Filled 2021-02-13: qty 1

## 2021-02-13 MED ORDER — SENNOSIDES-DOCUSATE SODIUM 8.6-50 MG PO TABS
1.0000 | ORAL_TABLET | Freq: Two times a day (BID) | ORAL | Status: DC
  Administered 2021-02-13 – 2021-02-20 (×10): 1 via ORAL
  Filled 2021-02-13 (×14): qty 1

## 2021-02-13 MED ORDER — SODIUM CHLORIDE 0.9 % IV SOLN
25.0000 mg | INTRAVENOUS | Status: DC | PRN
  Filled 2021-02-13: qty 0.5

## 2021-02-13 MED ORDER — HYDROXYUREA 500 MG PO CAPS
500.0000 mg | ORAL_CAPSULE | Freq: Two times a day (BID) | ORAL | Status: DC
  Administered 2021-02-13 – 2021-02-20 (×14): 500 mg via ORAL
  Filled 2021-02-13 (×14): qty 1

## 2021-02-13 MED ORDER — DIPHENHYDRAMINE HCL 50 MG/ML IJ SOLN
25.0000 mg | Freq: Once | INTRAMUSCULAR | Status: AC
  Administered 2021-02-13: 25 mg via INTRAVENOUS
  Filled 2021-02-13: qty 1

## 2021-02-13 MED ORDER — HYDROMORPHONE 1 MG/ML IV SOLN
INTRAVENOUS | Status: DC
  Administered 2021-02-13: 2.5 mg via INTRAVENOUS
  Administered 2021-02-13: 30 mg via INTRAVENOUS
  Administered 2021-02-14: 3 mg via INTRAVENOUS
  Administered 2021-02-14: 3.5 mg via INTRAVENOUS
  Administered 2021-02-14: 3 mg via INTRAVENOUS
  Administered 2021-02-14: 2 mg via INTRAVENOUS
  Administered 2021-02-14: 5.5 mg via INTRAVENOUS
  Administered 2021-02-14: 1 mg via INTRAVENOUS
  Administered 2021-02-15: 7 mg via INTRAVENOUS
  Administered 2021-02-15: 5.5 mg via INTRAVENOUS
  Administered 2021-02-15: 3.5 mg via INTRAVENOUS
  Administered 2021-02-15: 3 mg via INTRAVENOUS
  Administered 2021-02-15: 30 mg via INTRAVENOUS
  Administered 2021-02-16: 2.5 mg via INTRAVENOUS
  Administered 2021-02-16: 1.5 mg via INTRAVENOUS
  Administered 2021-02-16: 0.5 mg via INTRAVENOUS
  Administered 2021-02-16: 3.5 mg via INTRAVENOUS
  Administered 2021-02-16: 30 mg via INTRAVENOUS
  Administered 2021-02-16: 5.5 mg via INTRAVENOUS
  Administered 2021-02-17: 4.5 mg via INTRAVENOUS
  Administered 2021-02-17: 30 mg via INTRAVENOUS
  Administered 2021-02-17: 2 mg via INTRAVENOUS
  Administered 2021-02-17 (×2): 6 mg via INTRAVENOUS
  Administered 2021-02-17: 2.5 mg via INTRAVENOUS
  Administered 2021-02-17: 0 mg via INTRAVENOUS
  Administered 2021-02-17: 2 mg via INTRAVENOUS
  Administered 2021-02-18: 5 mg via INTRAVENOUS
  Administered 2021-02-18: 1 mg via INTRAVENOUS
  Administered 2021-02-18: 2.5 mg via INTRAVENOUS
  Filled 2021-02-13 (×4): qty 30

## 2021-02-13 MED ORDER — SODIUM CHLORIDE 0.9% FLUSH: 9.0000 mL | INTRAVENOUS | Status: DC | PRN

## 2021-02-13 MED ORDER — POLYETHYLENE GLYCOL 3350 17 G PO PACK: 17.0000 g | PACK | Freq: Every day | ORAL | Status: DC | PRN

## 2021-02-13 MED ORDER — BUPROPION HCL ER (XL) 150 MG PO TB24
150.0000 mg | ORAL_TABLET | Freq: Every day | ORAL | Status: DC
  Administered 2021-02-13 – 2021-02-17 (×5): 150 mg via ORAL
  Filled 2021-02-13 (×7): qty 1

## 2021-02-13 MED ORDER — NALOXONE HCL 0.4 MG/ML IJ SOLN: 0.4000 mg | INTRAMUSCULAR | Status: DC | PRN

## 2021-02-13 MED ORDER — ENOXAPARIN SODIUM 40 MG/0.4ML IJ SOSY
40.0000 mg | PREFILLED_SYRINGE | INTRAMUSCULAR | Status: DC
  Administered 2021-02-13 – 2021-02-15 (×3): 40 mg via SUBCUTANEOUS
  Filled 2021-02-13 (×7): qty 0.4

## 2021-02-13 MED ORDER — SODIUM CHLORIDE 0.45 % IV SOLN: INTRAVENOUS | Status: DC

## 2021-02-13 MED ORDER — ONDANSETRON HCL 4 MG/2ML IJ SOLN
4.0000 mg | INTRAMUSCULAR | Status: DC | PRN
  Administered 2021-02-13: 4 mg via INTRAVENOUS
  Filled 2021-02-13: qty 2

## 2021-02-13 MED ORDER — KETOROLAC TROMETHAMINE 15 MG/ML IJ SOLN
15.0000 mg | Freq: Four times a day (QID) | INTRAMUSCULAR | Status: AC
  Administered 2021-02-13 – 2021-02-18 (×19): 15 mg via INTRAVENOUS
  Filled 2021-02-13 (×19): qty 1

## 2021-02-13 MED ORDER — DIPHENHYDRAMINE HCL 25 MG PO CAPS
25.0000 mg | ORAL_CAPSULE | ORAL | Status: DC | PRN
  Administered 2021-02-13 – 2021-02-17 (×6): 25 mg via ORAL
  Filled 2021-02-13 (×6): qty 1

## 2021-02-13 MED ORDER — ONDANSETRON HCL 4 MG/2ML IJ SOLN: 4.0000 mg | Freq: Four times a day (QID) | INTRAMUSCULAR | Status: DC | PRN

## 2021-02-13 NOTE — ED Notes (Signed)
Pt refusing to keep cardiac monitor on. Restless in bed. Medicated for pain. Notified MD Doreene Burke

## 2021-02-13 NOTE — ED Triage Notes (Signed)
Pt arrived via EMS, c/o SCC, pain in both shoulders and back.

## 2021-02-13 NOTE — ED Provider Notes (Signed)
[place] DEPT Provider Note   CSN: [id number] Arrival date & time: 02/13/21  0456     History Chief Complaint  Patient presents with  . Sickle Cell Pain Crisis    Jeffrey Morrison is a 25 y.o. male.  HPI   Patient with SCD presents with pain in shoulders and back. Started 3 hours ago. No chest pain, no SOB, no chest pain. States this is the classic way his sickle flairs. He was seen 02/01/21 for Sickle cell pain crisis by me. States since that visit he has had been established with sickle cell clinic but is not yet on outpatient pain medicine.   Past Medical History:  Diagnosis Date  . Chronic lower back pain   . Depression   . Sickle cell crisis ([place])   . Sickle cell disease [facility])     Patient Active Problem List   Diagnosis Date Noted  . Transaminitis 11/12/2018  . Tobacco abuse 11/12/2018  . LFT elevation   . Total bilirubin, elevated   . Low oxygen saturation   . Agitation   . Community acquired pneumonia   . Polysubstance abuse ([place]) 07/12/2018  . Homelessness 07/12/2018  . Wheezing on expiration 07/12/2018  . Cough 07/12/2018  . Dysuria 07/12/2018  . Hb-SS disease with crisis ([place])   . Wheezing   . Sickle cell crisis ([place]) 07/10/2018  . Sickle cell pain crisis ([place]) 04/21/2018  . CMV (cytomegalovirus) ([place]) 08/27/2017  . Erectile dysfunction 08/27/2017  . Depression 08/25/2017  . OSA (obstructive sleep apnea)   . Anemia of chronic disease     Past Surgical History:  Procedure Laterality Date  . SPLENECTOMY         Family History  Problem Relation Age of Onset  . Diabetes Mother     Social History   Tobacco Use  . Smoking status: Current Every Day Smoker    Packs/day: 0.50    Types: Cigarettes  . Smokeless tobacco: Never Used  Vaping Use  . Vaping Use: Never used  Substance Use Topics  . Alcohol use: No  . Drug use: Yes    Types: IV, Methamphetamines    Comment: heroin     Home Medications Prior to  Admission medications   Medication Sig Start Date End Date Taking? Authorizing Provider  buPROPion (WELLBUTRIN XL) 150 MG 24 hr tablet Take 1 tablet (150 mg total) by mouth daily. 12/23/20   Dorena Dew, FNP  citalopram (CELEXA) 40 MG tablet Take 1 tablet (40 mg total) by mouth daily. 12/23/20   Dorena Dew, FNP  elvitegravir-cobicistat-emtricitabine-tenofovir (GENVOYA) 150-150-200-10 MG TABS tablet Take 1 tablet by mouth daily with breakfast. Patient not taking: Reported on 12/14/2020 10/05/18   Robinson, [place] N, PA-C  hydroxyurea (HYDREA) 500 MG capsule Take 1 capsule (500 mg total) by mouth 2 (two) times daily. May take with food to minimize GI side effects. 07/16/18   Dorena Dew, FNP  nystatin (MYCOSTATIN) 100000 UNIT/ML suspension Take 5 mLs (500,000 Units total) by mouth 4 (four) times daily. 12/23/20   Elwyn Reach, MD  oxyCODONE-acetaminophen (PERCOCET/ROXICET) 5-325 MG tablet Take 1 tablet by mouth every 6 (six) hours as needed for severe pain. 12/23/20   Dorena Dew, FNP  permethrin (ELIMITE) 5 % cream Apply thoroughly into the skin from your neck to the soles of your feet including the areas under your finger nails. Leave it on for 8-14 hours and then wash it off in the shower. Do  the same thing in 1-2 weeks later. 02/01/21   Henderly, Britni A, PA-C    Allergies    Patient has no known allergies.  Review of Systems   Review of Systems  Constitutional: Negative for chills and fever.  HENT: Negative for ear pain and sore throat.   Eyes: Negative for pain and visual disturbance.  Respiratory: Negative for cough and shortness of breath.   Cardiovascular: Negative for chest pain and palpitations.  Gastrointestinal: Negative for abdominal pain and vomiting.  Genitourinary: Negative for dysuria and hematuria.  Musculoskeletal: Positive for back pain. Negative for arthralgias.       Shoulder pain  Skin: Negative for color change and rash.  Neurological: Negative  for seizures and syncope.  All other systems reviewed and are negative.   Physical Exam Updated Vital Signs BP 117/64   Pulse 81   Temp 98 F (36.7 C) (Oral)   Resp (!) 24   SpO2 100%   Physical Exam Vitals and nursing note reviewed. Exam conducted with a chaperone present.  Constitutional:      General: He is not in acute distress.    Appearance: Normal appearance.  HENT:     Head: Normocephalic and atraumatic.  Eyes:     General: No scleral icterus.    Extraocular Movements: Extraocular movements intact.     Pupils: Pupils are equal, round, and reactive to light.  Cardiovascular:     Rate and Rhythm: Normal rate and regular rhythm.  Pulmonary:     Effort: Pulmonary effort is normal.     Breath sounds: Normal breath sounds.  Musculoskeletal:        General: Normal range of motion.     Comments: No point tenderness. No pony tenderness. Pain to shoulders bilaterally and back, but not worsened by touch or movement. Constant.  Skin:    Coloration: Skin is not jaundiced.  Neurological:     Mental Status: He is alert. Mental status is at baseline.     Coordination: Coordination normal.     ED Results / Procedures / Treatments   Labs (all labs ordered are listed, but only abnormal results are displayed) Labs Reviewed  CBC WITH DIFFERENTIAL/PLATELET - Abnormal; Notable for the following components:      Result Value   RBC 2.23 (*)    Hemoglobin 7.9 (*)    HCT 23.0 (*)    MCV 103.1 (*)    MCH 35.4 (*)    RDW 23.4 (*)    All other components within normal limits  RETICULOCYTES  COMPREHENSIVE METABOLIC PANEL    EKG None  Radiology No results found.  Procedures Procedures   Medications Ordered in ED Medications  dextrose 5 %-0.45 % sodium chloride infusion ( Intravenous New Bag/Given 02/13/21 0657)  HYDROmorphone (DILAUDID) injection 2 mg (has no administration in time range)  HYDROmorphone (DILAUDID) injection 2 mg (has no administration in time range)   diphenhydrAMINE (BENADRYL) injection 25 mg (has no administration in time range)  ondansetron (ZOFRAN) injection 4 mg (has no administration in time range)  ketorolac (TORADOL) 15 MG/ML injection 15 mg (15 mg Intravenous Given 02/13/21 7591)    ED Course  I have reviewed the triage vital signs and the nursing notes.  Pertinent labs & imaging results that were available during my care of the patient were reviewed by me and considered in my medical decision making (see chart for details).  Clinical Course as of 02/13/21 0919  Wed Feb 13, 2021  0720 CBC WITH  DIFFERENTIAL(!) Results baseline compared to 12 days ago with exception of WBC elevated to 16.1. However, 1 month ago he was 15.7. Suspect this elevation is due to [facility] rather than an infectious process [HS]  0837 BP(!): 96/44 Re-evaluated patient. He denies feeling lightheaded, tired or dizzy. Requesting back massage.  [HS]  9563 Reticulocytes(!) Elevated and responding appropriately for pain crisis.  [HS]  [phone] Patient reports his pain is still a 10/10 [HS]    Clinical Course User Index [HS] Sherrill Raring, PA-C   MDM Rules/Calculators/A&P                          Patient presents with sickle cell pain crisis. Low suspicion for acute chest or aplastic anemia given classic pain crisis presentation, lack of chest pain., SOB, and stable vitals.   Labs ordered and appropriate medicines ordered. Will reevaluate.   Patient still a 10/10. Not in aplastic anemic crisis or acute chest syndrome based on exam and labs. Patient will need to be admitted for pain crisis. Will consult sickle cell clinic.   Spoke with Evern Core who is agreeable to admitting him.   Final Clinical Impression(s) / ED Diagnoses Final diagnoses:  None    Rx / DC Orders ED Discharge Orders    None       Sherrill Raring, PA-C 02/13/21 1026    Malvin Johns, MD 02/13/21 1036

## 2021-02-13 NOTE — ED Notes (Signed)
Attempted to call pt guardian to inform pt is currently in ED, no answer at this time.

## 2021-02-13 NOTE — ED Notes (Signed)
Heating pads provided per pt's request for pain.

## 2021-02-13 NOTE — H&P (Signed)
H&P  Patient Demographics:  Jeffrey Morrison, is a 25 y.o. male  MRN: [id number]   DOB - [birth year]  Admit Date - 02/13/2021  Outpatient Primary MD for the patient is Dorena Dew, FNP  Chief Complaint  Patient presents with  . Sickle Cell Pain Crisis      HPI:   Jeffrey Morrison  is a 25 y.o. male with medical history significant for sickle cell disease who presented to the emergency room this morning with major complaints of generalized body pain that is consistent with his typical sickle cell pain crisis.  Although his pain is generalized, they are more intense in his upper back and both shoulder joints.  He rates his pain at 10/10 at any time of this encounter despite 3 doses of IV Dilaudid in the emergency room.  He is currently living from house to house as he is in a homeless situation.  He denies any trauma or fall.  He denies any fever.  He cannot really pinpoint any significant inciting factor for this current crisis.  He denies nausea, vomiting or diarrhea.  No urinary symptoms.  No sick contacts, recent travels or exposure to COVID-19.  ER course: Updated Vital Signs BP 117/64   Pulse 81   Temp 98 F (36.7 C) (Oral)   Resp (!) 24   SpO2 100%.  His comprehensive metabolic panel was unremarkable, WBC count is 16.1 with a hemoglobin of 7.9 which is around patient's baseline.  Reticulocyte count of 10%.  He is tested negative for COVID-19.  Patient remains in significant pain despite repeated doses of Dilaudid in the emergency room, will be admitted for further evaluation and management of sickle cell pain crisis.   Review of systems:  In addition to the HPI above, patient reports No fever or chills No Headache, No changes with vision or hearing No problems swallowing food or liquids No chest pain, cough or shortness of breath No abdominal pain, No nausea or vomiting, Bowel movements are regular No blood in stool or urine No dysuria No new skin rashes or bruises No new joints  pains-aches No new weakness, tingling, numbness in any extremity No recent weight gain or loss No polyuria, polydypsia or polyphagia No significant Mental Stressors  A full 10 point Review of Systems was done, except as stated above, all other Review of Systems were negative.  With Past History of the following :   Past Medical History:  Diagnosis Date  . Chronic lower back pain   . Depression   . Sickle cell crisis ([place])   . Sickle cell disease ([place])       Past Surgical History:  Procedure Laterality Date  . SPLENECTOMY       Social History:   Social History   Tobacco Use  . Smoking status: Current Every Day Smoker    Packs/day: 0.50    Types: Cigarettes  . Smokeless tobacco: Never Used  Substance Use Topics  . Alcohol use: No     Lives - At home   Family History :   Family History  Problem Relation Age of Onset  . Diabetes Mother      Home Medications:   Prior to Admission medications   Medication Sig Start Date End Date Taking? Authorizing Provider  buPROPion (WELLBUTRIN XL) 150 MG 24 hr tablet Take 1 tablet (150 mg total) by mouth daily. 12/23/20   Dorena Dew, FNP  citalopram (CELEXA) 40 MG tablet Take 1 tablet (40 mg total) by  mouth daily. 12/23/20   Dorena Dew, FNP  elvitegravir-cobicistat-emtricitabine-tenofovir (GENVOYA) 150-150-200-10 MG TABS tablet Take 1 tablet by mouth daily with breakfast. Patient not taking: Reported on 12/14/2020 10/05/18   Robinson, [place] N, PA-C  hydroxyurea (HYDREA) 500 MG capsule Take 1 capsule (500 mg total) by mouth 2 (two) times daily. May take with food to minimize GI side effects. 07/16/18   Dorena Dew, FNP  nystatin (MYCOSTATIN) 100000 UNIT/ML suspension Take 5 mLs (500,000 Units total) by mouth 4 (four) times daily. 12/23/20   Elwyn Reach, MD  oxyCODONE-acetaminophen (PERCOCET/ROXICET) 5-325 MG tablet Take 1 tablet by mouth every 6 (six) hours as needed for severe pain. 12/23/20   Dorena Dew,  FNP  permethrin (ELIMITE) 5 % cream Apply thoroughly into the skin from your neck to the soles of your feet including the areas under your finger nails. Leave it on for 8-14 hours and then wash it off in the shower. Do the same thing in 1-2 weeks later. 02/01/21   Henderly, Britni A, PA-C     Allergies:   No Known Allergies   Physical Exam:   Vitals:   Vitals:   02/13/21 0943 02/13/21 0945  BP: 121/63 121/63  Pulse: 74 75  Resp: 14   Temp:    SpO2: 98% 93%    Physical Exam: Constitutional: Patient appears well-developed and well-nourished. Not in obvious distress. HENT: Normocephalic, atraumatic, External right and left ear normal. Oropharynx is clear and moist.  Eyes: Conjunctivae and EOM are normal. PERRLA, no scleral icterus. Neck: Normal ROM. Neck supple. No JVD. No tracheal deviation. No thyromegaly. CVS: RRR, S1/S2 +, no murmurs, no gallops, no carotid bruit.  Pulmonary: Effort and breath sounds normal, no stridor, rhonchi, wheezes, rales.  Abdominal: Soft. BS +, no distension, tenderness, rebound or guarding.  Musculoskeletal: Normal range of motion. No edema and no tenderness.  Lymphadenopathy: No lymphadenopathy noted, cervical, inguinal or axillary Neuro: Alert. Normal reflexes, muscle tone coordination. No cranial nerve deficit. Skin: Skin is warm and dry. No rash noted. Not diaphoretic. No erythema. No pallor. Psychiatric: Normal mood and affect. Behavior, judgment, thought content normal.   Data Review:   CBC Recent Labs  Lab 02/13/21 0658  WBC 16.1*  HGB 7.9*  HCT 23.0*  PLT 356  MCV 103.1*  MCH 35.4*  MCHC 34.3  RDW 23.4*  LYMPHSABS 2.9  MONOABS 1.5*  EOSABS 0.4  BASOSABS 0.1   ------------------------------------------------------------------------------------------------------------------  Chemistries  Recent Labs  Lab 02/13/21 0658  NA 136  K 3.7  CL 106  CO2 25  GLUCOSE 101*  BUN 15  CREATININE 0.96  CALCIUM 8.8*  AST 28  ALT 18   ALKPHOS 77  BILITOT 2.8*   ------------------------------------------------------------------------------------------------------------------ estimated creatinine clearance is 126.9 mL/min (by C-G formula based on SCr of 0.96 mg/dL). ------------------------------------------------------------------------------------------------------------------ No results for input(s): TSH, T4TOTAL, T3FREE, THYROIDAB in the last 72 hours.  Invalid input(s): FREET3  Coagulation profile No results for input(s): INR, PROTIME in the last 168 hours. ------------------------------------------------------------------------------------------------------------------- No results for input(s): DDIMER in the last 72 hours. -------------------------------------------------------------------------------------------------------------------  Cardiac Enzymes No results for input(s): CKMB, TROPONINI, MYOGLOBIN in the last 168 hours.  Invalid input(s): CK ------------------------------------------------------------------------------------------------------------------ No results found for: BNP  ---------------------------------------------------------------------------------------------------------------  Urinalysis    Component Value Date/Time   COLORURINE AMBER (A) 11/12/2018 [place] 11/12/2018 1549   LABSPEC 1.019 11/12/2018 1549   PHURINE 6.0 11/12/2018 1549   GLUCOSEU NEGATIVE 11/12/2018 1549   HGBUR NEGATIVE 11/12/2018 1549  BILIRUBINUR NEGATIVE 11/12/2018 Morrison 11/12/2018 1549   PROTEINUR 30 (A) 11/12/2018 1549   UROBILINOGEN 1.0 10/02/2017 1116   NITRITE NEGATIVE 11/12/2018 1549   LEUKOCYTESUR NEGATIVE 11/12/2018 1549    ----------------------------------------------------------------------------------------------------------------   Imaging Results:    No results found.   Assessment & Plan:  Principal Problem:   Sickle cell anemia with crisis  ([place]) Active Problems:   Anemia of chronic disease   Tobacco abuse  1. Hb Sickle Cell Disease with crisis: Admit patient, start IVF 0.45% Saline @ 125 mls/hour, start weight based Dilaudid PCA, start IV Toradol 15 mg Q 6 H, Restart oral home pain medications, Monitor vitals very closely, Re-evaluate pain scale regularly, 2 L of Oxygen by [place], Patient will be re-evaluated for pain in the context of function and relationship to baseline as care progresses. 2. Leukocytosis: Most likely reactive to vaso-occlusive crisis. There is no evidence of infections or inflammation, will manage without antibiotics. Repeat labs in AM. 3. Sickle Cell Anemia: Hemoglobin is 7.9 which appears to be close to patient's baseline.  There is no clinical indication for blood transfusion today.  We will continue to monitor closely and hold transfusion unless hemoglobin drops below 6. 4. Chronic pain Syndrome: We will restart and continue his home pain medications.  Patient counseled extensively about other nonpharmacologic means of pain management. 5. History of major depression and anxiety: Patient denies any suicidal ideations or thoughts today.  We will restart and continue his home medications of Wellbutrin and Celexa. 6. Homelessness: Clinical licensed Education officer, museum consult for assistance. 7. Tobacco use disorder:Shreyas was counseled on the dangers of tobacco use, and was advised to quit. Reviewed strategies to maximize success, including removing cigarettes and smoking materials from environment, stress management and support of family/friends.  DVT Prophylaxis: Subcut Lovenox   AM Labs Ordered, also please review Full Orders  Family Communication: Admission, patient's condition and plan of care including tests being ordered have been discussed with the patient who indicate understanding and agree with the plan and Code Status.  Code Status: Full Code  Consults called: None    Admission status: Inpatient    Time  spent in minutes : 50 minutes  Angelica Chessman MD, MHA, CPE, FACP 02/13/2021 at 10:35 AM

## 2021-02-14 DIAGNOSIS — D638 Anemia in other chronic diseases classified elsewhere: Secondary | ICD-10-CM | POA: Diagnosis present

## 2021-02-14 DIAGNOSIS — F32A Depression, unspecified: Secondary | ICD-10-CM | POA: Diagnosis present

## 2021-02-14 DIAGNOSIS — G894 Chronic pain syndrome: Secondary | ICD-10-CM | POA: Diagnosis present

## 2021-02-14 DIAGNOSIS — F17210 Nicotine dependence, cigarettes, uncomplicated: Secondary | ICD-10-CM | POA: Diagnosis present

## 2021-02-14 DIAGNOSIS — M545 Low back pain, unspecified: Secondary | ICD-10-CM | POA: Diagnosis present

## 2021-02-14 DIAGNOSIS — Z20822 Contact with and (suspected) exposure to covid-19: Secondary | ICD-10-CM | POA: Diagnosis present

## 2021-02-14 DIAGNOSIS — F419 Anxiety disorder, unspecified: Secondary | ICD-10-CM | POA: Diagnosis present

## 2021-02-14 DIAGNOSIS — F329 Major depressive disorder, single episode, unspecified: Secondary | ICD-10-CM | POA: Diagnosis present

## 2021-02-14 DIAGNOSIS — Z833 Family history of diabetes mellitus: Secondary | ICD-10-CM | POA: Diagnosis not present

## 2021-02-14 DIAGNOSIS — Z5901 Sheltered homelessness: Secondary | ICD-10-CM | POA: Diagnosis not present

## 2021-02-14 DIAGNOSIS — Z9081 Acquired absence of spleen: Secondary | ICD-10-CM | POA: Diagnosis not present

## 2021-02-14 DIAGNOSIS — R45851 Suicidal ideations: Secondary | ICD-10-CM | POA: Diagnosis present

## 2021-02-14 DIAGNOSIS — Z79899 Other long term (current) drug therapy: Secondary | ICD-10-CM | POA: Diagnosis not present

## 2021-02-14 DIAGNOSIS — D5700 Hb-SS disease with crisis, unspecified: Secondary | ICD-10-CM | POA: Diagnosis present

## 2021-02-14 LAB — CBC WITH DIFFERENTIAL/PLATELET
Abs Immature Granulocytes: 0.23 10*3/uL — ABNORMAL HIGH (ref 0.00–0.07)
Basophils Absolute: 0 10*3/uL (ref 0.0–0.1)
Basophils Relative: 0 %
Eosinophils Absolute: 0.4 10*3/uL (ref 0.0–0.5)
Eosinophils Relative: 3 %
HCT: 18.9 % — ABNORMAL LOW (ref 39.0–52.0)
Hemoglobin: 6.6 g/dL — CL (ref 13.0–17.0)
Immature Granulocytes: 2 %
Lymphocytes Relative: 32 %
Lymphs Abs: 4.2 10*3/uL — ABNORMAL HIGH (ref 0.7–4.0)
MCH: 34.2 pg — ABNORMAL HIGH (ref 26.0–34.0)
MCHC: 34.9 g/dL (ref 30.0–36.0)
MCV: 97.9 fL (ref 80.0–100.0)
Monocytes Absolute: 1 10*3/uL (ref 0.1–1.0)
Monocytes Relative: 8 %
Neutro Abs: 7.1 10*3/uL (ref 1.7–7.7)
Neutrophils Relative %: 55 %
Platelets: 333 10*3/uL (ref 150–400)
RBC: 1.93 MIL/uL — ABNORMAL LOW (ref 4.22–5.81)
RDW: 25 % — ABNORMAL HIGH (ref 11.5–15.5)
WBC: 12.9 10*3/uL — ABNORMAL HIGH (ref 4.0–10.5)
nRBC: 21.4 % — ABNORMAL HIGH (ref 0.0–0.2)

## 2021-02-14 LAB — PREPARE RBC (CROSSMATCH)

## 2021-02-14 MED ORDER — HYDROMORPHONE HCL 1 MG/ML IJ SOLN
1.0000 mg | Freq: Once | INTRAMUSCULAR | Status: AC
  Administered 2021-02-14: 1 mg via INTRAVENOUS
  Filled 2021-02-14: qty 1

## 2021-02-14 MED ORDER — SODIUM CHLORIDE 0.9% IV SOLUTION: Freq: Once | INTRAVENOUS | Status: DC

## 2021-02-14 NOTE — Progress Notes (Signed)
Subjective: Patient is a 25 year old gentleman with history of sickle cell disease who was admitted with sickle cell painful crisis.  Patient has been on sickle cell pain control regimen.  He is on Dilaudid PCA Toradol and IV fluids.  Patient apparently lost his IV last night and has gotten prolonged period time without pain medications.  So far he is getting only 8.5 mg in the last 24 hours.  He is complaining of significant pain.  There was question of patient voicing suicidal ideation.  I have conversed with the patient he denied active suicidal ideation but has apparently had some thoughts of that in the past.  Patient denied ongoing feelings but is depressed.  He is depressed because his pain has not been under control.  He is otherwise hemodynamically stable.  Objective: Vital signs in last 24 hours: Temp:  [97.4 F (36.3 C)-98.8 F (37.1 C)] 97.7 F (36.5 C) (06/02 0933) Pulse Rate:  [53-111] 69 (06/02 0933) Resp:  [14-19] 15 (06/02 0933) BP: (87-121)/(54-77) 87/62 (06/02 0933) SpO2:  [91 %-98 %] 97 % (06/02 0933) FiO2 (%):  [0 %] 0 % (06/01 1648) Weight:  [85.8 kg] 85.8 kg (06/01 1643) Weight change:     Intake/Output from previous day: 06/01 0701 - 06/02 0700 In: 2349.3 [P.O.:240; I.V.:2109.3] Out: 2000 [Urine:2000] Intake/Output this shift: No intake/output data recorded.  General appearance: alert, cooperative, appears stated age and no distress Neck: no adenopathy, no carotid bruit, no JVD, supple, symmetrical, trachea midline and thyroid not enlarged, symmetric, no tenderness/mass/nodules Back: symmetric, no curvature. ROM normal. No CVA tenderness. Resp: clear to auscultation bilaterally Cardio: regular rate and rhythm, S1, S2 normal, no murmur, click, rub or gallop GI: soft, non-tender; bowel sounds normal; no masses,  no organomegaly Extremities: extremities normal, atraumatic, no cyanosis or edema Pulses: 2+ and symmetric Neurologic: Grossly normal  Lab  Results: Recent Labs    02/13/21 0658 02/14/21 0538  WBC 16.1* 12.9*  HGB 7.9* 6.6*  HCT 23.0* 18.9*  PLT 356 333   BMET Recent Labs    02/13/21 0658  NA 136  K 3.7  CL 106  CO2 25  GLUCOSE 101*  BUN 15  CREATININE 0.96  CALCIUM 8.8*    Studies/Results: No results found.  Medications: I have reviewed the patient's current medications.  Assessment/Plan: A 25 year old gentleman with known history of sickle cell disease who was admitted with sickle cell painful crisis.    #1 sickle cell painful crisis: Patient will be maintained on current Dilaudid PCA with Toradol.  I will give him 1 dose of 2 mg stat since he has been without pain medicine for a while.  Resume long-acting pain medications as well.  #2 sickle cell anemia: Patient's pain is getting worse.  Hemoglobin dropped to 6.6.  His there is evidence of possible hemolysis.  Patient would likely benefit from transfusion 1 unit of packed red blood cells.  We will continue to monitor.  #3 leukocytosis: Secondary to vaso-occlusive crisis.  #4 depression: Counseling provided.  Not actively suicidal.  Continue to monitor  #5 tobacco abuse: Counseling provided.  LOS: 0 days   Johndaniel Catlin,LAWAL 02/14/2021, 9:41 AM

## 2021-02-14 NOTE — BH Specialist Note (Signed)
Integrated Behavioral Health General Follow Up Note  02/14/2021 Name: NIKITAS DAVTYAN MRN: [id number] DOB: Jan 28, 1996 Ellison Hughs is a 25 y.o. year old male who sees Dorena Dew, FNP for primary care. LCSW was initially consulted to assist the patient with [employer].   Interpreter: No.   Interpreter Name & Language: none  Assessment: Patient experiencing homelessness, transportation barriers, lack of health coverage, and depression.  Ongoing Intervention: Today CSW visited patient at bedside in [place]. Did meet briefly with patient at admission in April. Advised patient of CSW's role at [facility]). Patient is well connected with caseworker at [facility] and [facility]); he reports she is helping him with finding housing.    Enrolled patient in [employer].   Patient reports he has not been able to pick up his medications because he cannot afford to pay for them. Patient had been working with [facility] caseworker to apply for sickle cell Medicaid, though currently does not have insurance on file. CSW LVM for social worker at the agency to follow up on this. Awaiting return call.   Patient also expressed some thoughts of suicide. He endorsed thoughts of a plan, though stated he thought of doing this if he "wasn't getting any help" with his social and medical needs. He stated he feels like he is getting help now so does not have intent to follow through on these thoughts. This was expressed in context of conversation about patient's multiple social needs and difficulty getting his medication. CSW advised patient to call [facility] office with needs in the future and advised that CSW would be in contact with [facility] caseworker and would enroll patient in transportation. Advised MD Doreene Burke of patient's endorsement of these thoughts as patient is admitted currently.   Review of patient status, including review of consultants reports, relevant  laboratory and other test results, and collaboration with appropriate care team members and the patient's provider was performed as part of comprehensive patient evaluation and provision of services.    Estanislado Emms, [facility] [phone]

## 2021-02-15 NOTE — Progress Notes (Signed)
Subjective: Patient is still not using his PCA adequately.  Still having pain at 7 out of 10.  He is depressed but has been taking his nose prongs out therefore pulls in his PCA.  Has used only 22 mg of Dilaudid in the last 24 hours.  On oral medications as well.  Objective: Vital signs in last 24 hours: Temp:  [98 F (36.7 C)-98.6 F (37 C)] 98.1 F (36.7 C) (06/03 2051) Pulse Rate:  [60-80] 66 (06/03 2051) Resp:  [12-18] 12 (06/03 2051) BP: (100-111)/(56-94) 111/94 (06/03 2051) SpO2:  [84 %-99 %] 96 % (06/03 2051) FiO2 (%):  [0 %] 0 % (06/03 0824) Weight:  [85.8 kg] 85.8 kg (06/03 0425) Weight change: 0 kg    Intake/Output from previous day: 06/02 0701 - 06/03 0700 In: 594 [P.O.:300; Blood:294] Out: 800 [Urine:800] Intake/Output this shift: Total I/O In: -  Out: 1000 [Urine:1000]  General appearance: alert, cooperative, appears stated age and no distress Neck: no adenopathy, no carotid bruit, no JVD, supple, symmetrical, trachea midline and thyroid not enlarged, symmetric, no tenderness/mass/nodules Back: symmetric, no curvature. ROM normal. No CVA tenderness. Resp: clear to auscultation bilaterally Cardio: regular rate and rhythm, S1, S2 normal, no murmur, click, rub or gallop GI: soft, non-tender; bowel sounds normal; no masses,  no organomegaly Extremities: extremities normal, atraumatic, no cyanosis or edema Pulses: 2+ and symmetric Neurologic: Grossly normal  Lab Results: Recent Labs    02/13/21 0658 02/14/21 0538  WBC 16.1* 12.9*  HGB 7.9* 6.6*  HCT 23.0* 18.9*  PLT 356 333   BMET Recent Labs    02/13/21 0658  NA 136  K 3.7  CL 106  CO2 25  GLUCOSE 101*  BUN 15  CREATININE 0.96  CALCIUM 8.8*    Studies/Results: No results found.  Medications: I have reviewed the patient's current medications.  Assessment/Plan: A 25 year old gentleman with known history of sickle cell disease who was admitted with sickle cell painful crisis.    #1 sickle  cell painful crisis: Patient will be maintained on current Dilaudid PCA with Toradol.  Encouraged to use his PCA regimen consistently.  Continue to encourage mobility.  #2 sickle cell anemia: Patient's pain is stable.  No evidence of hemolysis we will continue to monitor.  #3 leukocytosis: Secondary to vaso-occlusive crisis.  #4 depression: Not suicidal but still depressed.  Counseling provided.  Continue to monitor  #5 tobacco abuse: Counseling provided.   LOS: 1 day   Leonda Cristo,LAWAL 02/15/2021, 9:49 PM

## 2021-02-15 NOTE — BH Specialist Note (Signed)
Integrated Behavioral Health General Follow Up Note  02/15/2021 Name: FATIH STALVEY MRN: [id number] DOB: Apr 24, 1996 Ellison Hughs is a 25 y.o. year old male who sees Dorena Dew, FNP for primary care. LCSW was initially consulted to assist the patient with [employer].  Interpreter: No.   Interpreter Name & Language: none  Assessment: Patient experiencing homelessness, transportation barriers, lack of health coverage, and depression.  Ongoing Intervention: Today CSW visited patient at bedside in [place]. Patient signed transportation services waiver.   Provided resources for mental health crisis: [facility]) [phone], [place], National Suicide Prevention Lifeline (854)515-3858, and crisis text line 251-011-7698.  Referred patient to [facility] and Sickle Cell [facility]) for continued assistance with applying for sickle cell Medicaid. Spoke to [place] worker on the phone while at bedside and she indicated patient's case worker would follow up with him. She reported they previously had an appointment for patient to come do the paperwork for this but he did not attend.   Provided patient with Patient [facility]) and CSW's direct contact information and advised CSW available from the clinic for continued follow up.  Review of patient status, including review of consultants reports, relevant laboratory and other test results, and collaboration with appropriate care team members and the patient's provider was performed as part of comprehensive patient evaluation and provision of services.    Estanislado Emms, [facility] [phone]

## 2021-02-16 MED ORDER — LIP MEDEX EX OINT
TOPICAL_OINTMENT | CUTANEOUS | Status: AC
  Filled 2021-02-16: qty 7

## 2021-02-16 MED ORDER — OXYCODONE HCL 5 MG PO TABS
5.0000 mg | ORAL_TABLET | ORAL | Status: DC
  Administered 2021-02-16 – 2021-02-18 (×11): 5 mg via ORAL
  Filled 2021-02-16 (×11): qty 1

## 2021-02-16 NOTE — Progress Notes (Signed)
Subjective: Patient is still in significant pain but that is because he is not using his PCA adequately.  He is depressed but has been taking his nose prongs out therefore pulls in his PCA.  He will need a different way to get his pain under control it appears.  We however need a safe weight since he is not using the protective mechanism of his nasal oxygen prongs.   Objective: Vital signs in last 24 hours: Temp:  [98 F (36.7 C)-98.4 F (36.9 C)] 98.2 F (36.8 C) (06/04 1327) Pulse Rate:  [63-79] 67 (06/04 1327) Resp:  [12-20] 20 (06/04 1327) BP: (101-119)/(58-94) 104/58 (06/04 1327) SpO2:  [81 %-98 %] 95 % (06/04 1327) Weight change:     Intake/Output from previous day: 06/03 0701 - 06/04 0700 In: 300 [P.O.:300] Out: 2200 [Urine:2200] Intake/Output this shift: No intake/output data recorded.  General appearance: alert, cooperative, appears stated age and no distress Neck: no adenopathy, no carotid bruit, no JVD, supple, symmetrical, trachea midline and thyroid not enlarged, symmetric, no tenderness/mass/nodules Back: symmetric, no curvature. ROM normal. No CVA tenderness. Resp: clear to auscultation bilaterally Cardio: regular rate and rhythm, S1, S2 normal, no murmur, click, rub or gallop GI: soft, non-tender; bowel sounds normal; no masses,  no organomegaly Extremities: extremities normal, atraumatic, no cyanosis or edema Pulses: 2+ and symmetric Neurologic: Grossly normal  Lab Results: Recent Labs    02/14/21 0538  WBC 12.9*  HGB 6.6*  HCT 18.9*  PLT 333   BMET No results for input(s): NA, K, CL, CO2, GLUCOSE, BUN, CREATININE, CALCIUM in the last 72 hours.  Studies/Results: No results found.  Medications: I have reviewed the patient's current medications.  Assessment/Plan: A 25 year old gentleman with known history of sickle cell disease who was admitted with sickle cell painful crisis.    #1 sickle cell painful crisis: Patient will be maintained on current  Dilaudid PCA with Toradol.  Encouraged to use his PCA regimen consistently.  I will increase his oral medications to 10 mg of oxycodone schedule overnight.  Continue to encourage mobility.  #2 sickle cell anemia: Patient's pain is stable.  No evidence of hemolysis we will continue to monitor.  #3 leukocytosis: Secondary to vaso-occlusive crisis.  #4 depression: Not suicidal but still depressed.  Counseling provided.  Continue to monitor  #5 tobacco abuse: Counseling provided.   LOS: 2 days   Nadia Torr,LAWAL 02/16/2021, 4:45 PM

## 2021-02-17 LAB — COMPREHENSIVE METABOLIC PANEL
ALT: 61 U/L — ABNORMAL HIGH (ref 0–44)
AST: 74 U/L — ABNORMAL HIGH (ref 15–41)
Albumin: 3.5 g/dL (ref 3.5–5.0)
Alkaline Phosphatase: 92 U/L (ref 38–126)
Anion gap: 7 (ref 5–15)
BUN: 11 mg/dL (ref 6–20)
CO2: 27 mmol/L (ref 22–32)
Calcium: 8.5 mg/dL — ABNORMAL LOW (ref 8.9–10.3)
Chloride: 101 mmol/L (ref 98–111)
Creatinine, Ser: 0.84 mg/dL (ref 0.61–1.24)
GFR, Estimated: >60 mL/min (ref >60)
Glucose, Bld: 89 mg/dL (ref 70–99)
Potassium: 4.3 mmol/L (ref 3.5–5.1)
Sodium: 135 mmol/L (ref 135–145)
Total Bilirubin: 4.8 mg/dL — ABNORMAL HIGH (ref 0.3–1.2)
Total Protein: 6.9 g/dL (ref 6.5–8.1)

## 2021-02-17 LAB — CBC WITH DIFFERENTIAL/PLATELET
Band Neutrophils: 0 %
Basophils Relative: 1 %
Blasts: NONE SEEN %
Eosinophils Relative: 6 %
HCT: 16.8 % — ABNORMAL LOW (ref 39.0–52.0)
Hemoglobin: 6.1 g/dL — CL (ref 13.0–17.0)
Lymphocytes Relative: 40 %
MCH: 34.9 pg — ABNORMAL HIGH (ref 26.0–34.0)
MCHC: 36.3 g/dL — ABNORMAL HIGH (ref 30.0–36.0)
MCV: 96 fL (ref 80.0–100.0)
Metamyelocytes Relative: NONE SEEN %
Monocytes Relative: 5 %
Myelocytes: NONE SEEN %
Neutrophils Relative %: 48 %
Platelets: 321 10*3/uL (ref 150–400)
Promyelocytes Relative: NONE SEEN %
RBC: 1.75 MIL/uL — ABNORMAL LOW (ref 4.22–5.81)
RDW: 25.2 % — ABNORMAL HIGH (ref 11.5–15.5)
WBC Morphology: NORMAL
WBC: 6.6 10*3/uL (ref 4.0–10.5)
nRBC: 58 /100 WBC — ABNORMAL HIGH

## 2021-02-17 LAB — PREPARE RBC (CROSSMATCH)

## 2021-02-17 MED ORDER — SODIUM CHLORIDE 0.9% IV SOLUTION: Freq: Once | INTRAVENOUS | Status: DC

## 2021-02-17 MED ORDER — ACETAMINOPHEN 325 MG PO TABS
650.0000 mg | ORAL_TABLET | Freq: Four times a day (QID) | ORAL | Status: DC | PRN
  Administered 2021-02-17 – 2021-02-18 (×2): 650 mg via ORAL
  Filled 2021-02-17 (×2): qty 2

## 2021-02-17 NOTE — Progress Notes (Signed)
Rn confirmed with Dr. Jonelle Sidle that a total of two units of PRBC's need to be transfused. Rn communicated this information with blood bank.

## 2021-02-17 NOTE — Plan of Care (Signed)
  Problem: Self-Care: Goal: Ability to incorporate actions that prevent/reduce pain crisis will improve Outcome: Progressing   Problem: Fluid Volume: Goal: Ability to maintain a balanced intake and output will improve Outcome: Progressing   Problem: Education: Goal: Knowledge of General Education information will improve Description: Including pain rating scale, medication(s)/side effects and non-pharmacologic comfort measures Outcome: Progressing   Problem: Clinical Measurements: Goal: Ability to maintain clinical measurements within normal limits will improve Outcome: Progressing   Problem: Clinical Measurements: Goal: Cardiovascular complication will be avoided Outcome: Progressing   Problem: Clinical Measurements: Goal: Respiratory complications will improve Outcome: Progressing

## 2021-02-17 NOTE — Progress Notes (Signed)
Date and time results received: 02/17/21 6:23 AM )  Test: Hgb Critical Value: 6.1  Name of Provider Notified: M. Sharlet Salina, [place]  Orders Received? Or Actions Taken?:Awaiting response

## 2021-02-17 NOTE — Progress Notes (Signed)
Pt continues to pull off 02 [place] and capnography. Pt remains stable. Rn encouraging pt to keep all necessary lines attached and on. Rn will continue to monitor.

## 2021-02-17 NOTE — Progress Notes (Signed)
Subjective: Patient is still in significant pain but that is because he is not using his PCA adequately.  He is depressed but has been taking his nose prongs out therefore pulls in his PCA.  His hemoglobin has also dropped significantly.  He appears to be having additional hemolytic crisis.  He is however fully awake and alert.  Not suicidal.  Objective: Vital signs in last 24 hours: Temp:  [98.2 F (36.8 C)-99.2 F (37.3 C)] 98.4 F (36.9 C) (06/05 0435) Pulse Rate:  [67-72] 72 (06/05 0435) Resp:  [12-20] 18 (06/05 0754) BP: (100-115)/(56-66) 105/56 (06/05 0435) SpO2:  [90 %-98 %] 98 % (06/05 0754) Weight change:     Intake/Output from previous day: 06/04 0701 - 06/05 0700 In: -  Out: 2325 [Urine:2325] Intake/Output this shift: Total I/O In: 540 [P.O.:240; I.V.:300] Out: 400 [Urine:400]  General appearance: alert, cooperative, appears stated age and no distress Neck: no adenopathy, no carotid bruit, no JVD, supple, symmetrical, trachea midline and thyroid not enlarged, symmetric, no tenderness/mass/nodules Back: symmetric, no curvature. ROM normal. No CVA tenderness. Resp: clear to auscultation bilaterally Cardio: regular rate and rhythm, S1, S2 normal, no murmur, click, rub or gallop GI: soft, non-tender; bowel sounds normal; no masses,  no organomegaly Extremities: extremities normal, atraumatic, no cyanosis or edema Pulses: 2+ and symmetric Neurologic: Grossly normal  Lab Results: Recent Labs    02/17/21 0533  WBC 6.6  HGB 6.1*  HCT 16.8*  PLT 321   BMET Recent Labs    02/17/21 0533  NA 135  K 4.3  CL 101  CO2 27  GLUCOSE 89  BUN 11  CREATININE 0.84  CALCIUM 8.5*    Studies/Results: No results found.  Medications: I have reviewed the patient's current medications.  Assessment/Plan: A 25 year old gentleman with known history of sickle cell disease who was admitted with sickle cell painful crisis.    #1 sickle cell painful crisis: Patient will be  maintained on current Dilaudid PCA with Toradol.  Encouraged to use his PCA regimen consistently.  On scheduled oxycodone every 4 hours also.  Continue to encourage mobility.  #2 sickle cell anemia: Patient appears to be having hemolytic crisis.  Hemoglobin dropped to 6.1.  His baseline is somewhere between 7 and 8 g.  I have ordered transfusion of PRBC awaiting that..  #3 leukocytosis: Secondary to vaso-occlusive crisis.  #4 depression: Not suicidal but still depressed.  Counseling provided.  Continue to monitor  #5 tobacco abuse: Counseling provided.   LOS: 3 days   Amand Lemoine,LAWAL 02/17/2021, 8:33 AM

## 2021-02-17 NOTE — Progress Notes (Signed)
Taking vital signs pre blood, patient's temp-99.8. Dr. Jonelle Sidle called he ordered Tylenol 650 mg PO, which was given to the patient. Report given to the night nurse.

## 2021-02-18 LAB — TYPE AND SCREEN
ABO/RH(D): A POS
ABO/RH(D): A POS
Antibody Screen: POSITIVE
Antibody Screen: POSITIVE
Unit division: 0
Unit division: 0
Unit division: 0

## 2021-02-18 LAB — BPAM RBC
Blood Product Expiration Date: 202206232359
Blood Product Expiration Date: 202206302359
Blood Product Expiration Date: 202207092359
ISSUE DATE / TIME: 202206030118
ISSUE DATE / TIME: 202206051955
ISSUE DATE / TIME: 202206052253
Unit Type and Rh: 6200
Unit Type and Rh: 6200
Unit Type and Rh: 6200

## 2021-02-18 LAB — HEMOGLOBIN AND HEMATOCRIT, BLOOD
HCT: 19.2 % — ABNORMAL LOW (ref 39.0–52.0)
Hemoglobin: 6.9 g/dL — CL (ref 13.0–17.0)

## 2021-02-18 MED ORDER — HYDROMORPHONE HCL 1 MG/ML IJ SOLN
1.0000 mg | INTRAMUSCULAR | Status: DC | PRN
  Administered 2021-02-18: 1 mg via INTRAVENOUS
  Filled 2021-02-18: qty 1

## 2021-02-18 MED ORDER — NICOTINE 21 MG/24HR TD PT24
21.0000 mg | MEDICATED_PATCH | Freq: Every day | TRANSDERMAL | Status: DC
  Filled 2021-02-18 (×2): qty 1

## 2021-02-18 MED ORDER — OXYCODONE HCL 5 MG PO TABS
10.0000 mg | ORAL_TABLET | ORAL | Status: DC
  Administered 2021-02-18 – 2021-02-20 (×11): 10 mg via ORAL
  Filled 2021-02-18 (×11): qty 2

## 2021-02-18 NOTE — Progress Notes (Signed)
Subjective: Jeffrey Morrison is a 25 year old male with a medical history significant for sickle cell disease, chronic pain, history of depression and anxiety was admitted for sickle cell pain crisis. Patient has no new complaints on today.  However, he continues to complain of pain primarily to lower back.  Patient characterizes his pain as constant and aching.  He is not attempted to get out of bed today.  He says that pain is persisting despite PCA.  He is not maximizing PCA use.  He says that he would rather not have a PCA. He denies any headache, chest pain, dizziness, urinary symptoms, nausea, vomiting, or diarrhea.  Objective:  Vital signs in last 24 hours:  Vitals:   02/18/21 0859 02/18/21 1004 02/18/21 1300 02/18/21 1417  BP:  123/78  133/61  Pulse:  61  60  Resp: (!) 23 17 14 18   Temp:  98.9 F (37.2 C)  98.2 F (36.8 C)  TempSrc:  Oral  Oral  SpO2: 100% 92% 94% (!) 89%  Weight:        Intake/Output from previous day:   Intake/Output Summary (Last 24 hours) at 02/18/2021 1426 Last data filed at 02/18/2021 0439 Gross per 24 hour  Intake 2068.79 ml  Output 1800 ml  Net 268.79 ml    Physical Exam: General: Alert, awake, oriented x3, in no acute distress.  HEENT: [place]/AT [place]ERL, EOMI Neck: Trachea midline,  no masses, no thyromegal,y no JVD, no carotid bruit OROPHARYNX:  Moist, No exudate/ erythema/lesions.  Heart: Regular rate and rhythm, without murmurs, rubs, gallops, PMI non-displaced, no heaves or thrills on palpation.  Lungs: Clear to auscultation, no wheezing or rhonchi noted. No increased vocal fremitus resonant to percussion  Abdomen: Soft, nontender, nondistended, positive bowel sounds, no masses no hepatosplenomegaly noted..  Neuro: No focal neurological deficits noted cranial nerves II through XII grossly intact. DTRs 2+ bilaterally upper and lower extremities. Strength 5 out of 5 in bilateral upper and lower extremities. Musculoskeletal: No warm swelling or erythema  around joints, no spinal tenderness noted. Psychiatric: Patient alert and oriented x3, good insight and cognition, good recent to remote recall. Lymph node survey: No cervical axillary or inguinal lymphadenopathy noted.  Lab Results:  Basic Metabolic Panel:    Component Value Date/Time   NA 135 02/17/2021 0533   NA 140 10/02/2017 1126   K 4.3 02/17/2021 0533   CL 101 02/17/2021 0533   CO2 27 02/17/2021 0533   BUN 11 02/17/2021 0533   BUN 9 10/02/2017 1126   CREATININE 0.84 02/17/2021 0533   GLUCOSE 89 02/17/2021 0533   CALCIUM 8.5 (L) 02/17/2021 0533   CBC:    Component Value Date/Time   WBC 6.6 02/17/2021 0533   HGB 6.9 (LL) 02/18/2021 0846   HGB 7.5 (L) 10/02/2017 1126   HCT 19.2 (L) 02/18/2021 0846   HCT 22.0 (L) 10/02/2017 1126   PLT 321 02/17/2021 0533   PLT 511 (H) 10/02/2017 1126   MCV 96.0 02/17/2021 0533   MCV 94 10/02/2017 1126   NEUTROABS 7.1 02/14/2021 0538   NEUTROABS 8.8 (H) 10/02/2017 1126   LYMPHSABS 4.2 (H) 02/14/2021 0538   LYMPHSABS 5.2 (H) 10/02/2017 1126   MONOABS 1.0 02/14/2021 0538   EOSABS 0.4 02/14/2021 0538   EOSABS 0.2 10/02/2017 1126   BASOSABS 0.0 02/14/2021 0538   BASOSABS 0.1 10/02/2017 1126    Recent Results (from the past 240 hour(s))  Resp Panel by RT-PCR (Flu A&B, Covid) Nasopharyngeal Swab     Status: None  Collection Time: 02/13/21  2:55 PM   Specimen: Nasopharyngeal Swab; Nasopharyngeal(NP) swabs in vial transport medium  Result Value Ref Range Status   SARS Coronavirus 2 by RT PCR NEGATIVE NEGATIVE Final    Comment: (NOTE) SARS-CoV-2 target nucleic acids are NOT DETECTED.  The SARS-CoV-2 RNA is generally detectable in upper respiratory specimens during the acute phase of infection. The lowest concentration of SARS-CoV-2 viral copies this assay can detect is 138 copies/mL. A negative result does not preclude SARS-Cov-2 infection and should not be used as the sole basis for treatment or other patient management decisions.  A negative result may occur with  improper specimen collection/handling, submission of specimen other than nasopharyngeal swab, presence of viral mutation(s) within the areas targeted by this assay, and inadequate number of viral copies(<138 copies/mL). A negative result must be combined with clinical observations, patient history, and epidemiological information. The expected result is Negative.  Fact Sheet for Patients:  EntrepreneurPulse.com.au  Fact Sheet for Healthcare Providers:  IncredibleEmployment.be  This test is no t yet approved or cleared by the [place] FDA and  has been authorized for detection and/or diagnosis of SARS-CoV-2 by FDA under an Emergency Use Authorization (EUA). This EUA will remain  in effect (meaning this test can be used) for the duration of the COVID-19 declaration under Section 564(b)(1) of the Act, 21 U.S.C.section 360bbb-3(b)(1), unless the authorization is terminated  or revoked sooner.       Influenza A by PCR NEGATIVE NEGATIVE Final   Influenza B by PCR NEGATIVE NEGATIVE Final    Comment: (NOTE) The Xpert Xpress SARS-CoV-2/FLU/RSV plus assay is intended as an aid in the diagnosis of influenza from Nasopharyngeal swab specimens and should not be used as a sole basis for treatment. Nasal washings and aspirates are unacceptable for Xpert Xpress SARS-CoV-2/FLU/RSV testing.  Fact Sheet for Patients: EntrepreneurPulse.com.au  Fact Sheet for Healthcare Providers: IncredibleEmployment.be  This test is not yet approved or cleared by the [place] FDA and has been authorized for detection and/or diagnosis of SARS-CoV-2 by FDA under an Emergency Use Authorization (EUA). This EUA will remain in effect (meaning this test can be used) for the duration of the COVID-19 declaration under Section 564(b)(1) of the Act, 21 U.S.C. section 360bbb-3(b)(1), unless the authorization  is terminated or revoked.  Performed at [facility], [place] [address] [place], [place] [address]     Studies/Results: No results found.  Medications: Scheduled Meds: . sodium chloride   Intravenous Once  . buPROPion  150 mg Oral Daily  . citalopram  40 mg Oral Daily  . enoxaparin (LOVENOX) injection  40 mg Subcutaneous Q24H  . HYDROmorphone   Intravenous Q4H  . hydroxyurea  500 mg Oral BID  . oxyCODONE  5 mg Oral Q4H  . senna-docusate  1 tablet Oral BID   Continuous Infusions: . sodium chloride 10 mL/hr at 02/18/21 0628  . diphenhydrAMINE     PRN Meds:.acetaminophen, diphenhydrAMINE **OR** diphenhydrAMINE, naloxone **AND** sodium chloride flush, ondansetron (ZOFRAN) IV, polyethylene glycol  Consultants:  None  Procedures:  None  Antibiotics:  None  Assessment/Plan: Principal Problem:   Sickle cell anemia with crisis (HCC) Active Problems:   Anemia of chronic disease   Tobacco abuse  Sickle cell disease with pain crisis: Patient has had Dilaudid PCA for total of 5 days, will wean IV medications.  Discontinue PCA.  Dilaudid 1 mg every 4 hours as needed for severe breakthrough pain. Increase oxycodone to 10 mg every 4 hours as  needed Decrease IV fluids to [facility] Patient has completed Toradol Monitor vital signs very closely, reevaluate pain scale regularly, and supplemental oxygen as needed.  Sickle cell anemia: On yesterday, patient's hemoglobin dropped to 6.1.  Transfused 1 unit PRBCs.  Today, hemoglobin has increased to 6.9.  We will continue to follow closely.  Repeat CBC in AM.  Transfuse if patient's hemoglobin is less than 6.0 g/dL.  Leukocytosis: Secondary to vaso-occlusive crisis.  Patient has no signs of active inflammation.  No antibiotics warranted at this time.  Continue to follow closely.  History of depression and anxiety: Continue home medications.  Ongoing depression.  He denies any suicidal homicidal intent.  Counseled at  length.  Continue to monitor.  Tobacco abuse: Smoking cessation counseling given  Code Status: Full Code Family Communication: N/A Disposition Plan: Not yet ready for discharge   [place], MSN, FNP-C Patient [place], [place] [address] [phone]  If 5PM-7AM, please contact night-coverage.  02/18/2021, 2:26 PM  LOS: 4 days

## 2021-02-19 ENCOUNTER — Inpatient Hospital Stay (HOSPITAL_COMMUNITY): Payer: Self-pay

## 2021-02-19 LAB — CBC
HCT: 21.9 % — ABNORMAL LOW (ref 39.0–52.0)
Hemoglobin: 7.8 g/dL — ABNORMAL LOW (ref 13.0–17.0)
MCH: 34.2 pg — ABNORMAL HIGH (ref 26.0–34.0)
MCHC: 35.6 g/dL (ref 30.0–36.0)
MCV: 96.1 fL (ref 80.0–100.0)
Platelets: 361 10*3/uL (ref 150–400)
RBC: 2.28 MIL/uL — ABNORMAL LOW (ref 4.22–5.81)
RDW: 24.6 % — ABNORMAL HIGH (ref 11.5–15.5)
WBC: 13.5 10*3/uL — ABNORMAL HIGH (ref 4.0–10.5)
nRBC: 109.4 % — ABNORMAL HIGH (ref 0.0–0.2)

## 2021-02-19 LAB — BASIC METABOLIC PANEL
Anion gap: 6 (ref 5–15)
BUN: 9 mg/dL (ref 6–20)
CO2: 28 mmol/L (ref 22–32)
Calcium: 8.8 mg/dL — ABNORMAL LOW (ref 8.9–10.3)
Chloride: 104 mmol/L (ref 98–111)
Creatinine, Ser: 0.83 mg/dL (ref 0.61–1.24)
GFR, Estimated: >60 mL/min (ref >60)
Glucose, Bld: 96 mg/dL (ref 70–99)
Potassium: 4.8 mmol/L (ref 3.5–5.1)
Sodium: 138 mmol/L (ref 135–145)

## 2021-02-19 MED ORDER — ONDANSETRON HCL 4 MG/2ML IJ SOLN
4.0000 mg | Freq: Four times a day (QID) | INTRAMUSCULAR | Status: DC | PRN
  Administered 2021-02-19 – 2021-02-20 (×3): 4 mg via INTRAVENOUS
  Filled 2021-02-19 (×3): qty 2

## 2021-02-19 NOTE — Progress Notes (Signed)
Subjective: Jeffrey Morrison is a 25 year old male with a medical history significant for sickle cell disease, chronic pain syndrome, history of depression and anxiety that was admitted for sickle cell pain crisis.  Patient is mostly complaining of nausea.  He is also having pain primarily to low back.  He rates pain as 8/10.  He currently denies any headache, chest pain, dizziness, urinary symptoms, vomiting,  Objective:  Vital signs in last 24 hours:  Vitals:   02/18/21 1842 02/18/21 2039 02/19/21 0137 02/19/21 0508  BP: 91/76 120/71 118/66 121/75  Pulse: 69 66 (!) 56 (!) 57  Resp: 18 18 14 16   Temp: 99 F (37.2 C) 99.7 F (37.6 C) 99 F (37.2 C) 99.3 F (37.4 C)  TempSrc: Oral Oral Oral Oral  SpO2: 92% 93% 90% (!) 89%  Weight:        Intake/Output from previous day:   Intake/Output Summary (Last 24 hours) at 02/19/2021 0936 Last data filed at 02/19/2021 0845 Gross per 24 hour  Intake 0 ml  Output 2200 ml  Net -2200 ml    Physical Exam: General: Alert, awake, oriented x3, in no acute distress.  HEENT: [place]/AT [place], EOMI Neck: Trachea midline,  no masses, no thyromegal,y no JVD, no carotid bruit OROPHARYNX:  Moist, No exudate/ erythema/lesions.  Heart: Regular rate and rhythm, without murmurs, rubs, gallops, PMI non-displaced, no heaves or thrills on palpation.  Lungs: Clear to auscultation, no wheezing or rhonchi noted. No increased vocal fremitus resonant to percussion  Abdomen: Soft, nontender, nondistended, positive bowel sounds, no masses no hepatosplenomegaly noted..  Neuro: No focal neurological deficits noted cranial nerves II through XII grossly intact. DTRs 2+ bilaterally upper and lower extremities. Strength 5 out of 5 in bilateral upper and lower extremities. Musculoskeletal: No warm swelling or erythema around joints, no spinal tenderness noted. Psychiatric: Patient alert and oriented x3, good insight and cognition, good recent to remote recall. Lymph node survey:  No cervical axillary or inguinal lymphadenopathy noted.  Lab Results:  Basic Metabolic Panel:    Component Value Date/Time   NA 135 02/17/2021 0533   NA 140 10/02/2017 1126   K 4.3 02/17/2021 0533   CL 101 02/17/2021 0533   CO2 27 02/17/2021 0533   BUN 11 02/17/2021 0533   BUN 9 10/02/2017 1126   CREATININE 0.84 02/17/2021 0533   GLUCOSE 89 02/17/2021 0533   CALCIUM 8.5 (L) 02/17/2021 0533   CBC:    Component Value Date/Time   WBC 6.6 02/17/2021 0533   HGB 6.9 (LL) 02/18/2021 0846   HGB 7.5 (L) 10/02/2017 1126   HCT 19.2 (L) 02/18/2021 0846   HCT 22.0 (L) 10/02/2017 1126   PLT 321 02/17/2021 0533   PLT 511 (H) 10/02/2017 1126   MCV 96.0 02/17/2021 0533   MCV 94 10/02/2017 1126   NEUTROABS 7.1 02/14/2021 0538   NEUTROABS 8.8 (H) 10/02/2017 1126   LYMPHSABS 4.2 (H) 02/14/2021 0538   LYMPHSABS 5.2 (H) 10/02/2017 1126   MONOABS 1.0 02/14/2021 0538   EOSABS 0.4 02/14/2021 0538   EOSABS 0.2 10/02/2017 1126   BASOSABS 0.0 02/14/2021 0538   BASOSABS 0.1 10/02/2017 1126    Recent Results (from the past 240 hour(s))  Resp Panel by RT-PCR (Flu A&B, Covid) Nasopharyngeal Swab     Status: None   Collection Time: 02/13/21  2:55 PM   Specimen: Nasopharyngeal Swab; Nasopharyngeal(NP) swabs in vial transport medium  Result Value Ref Range Status   SARS Coronavirus 2 by RT PCR NEGATIVE NEGATIVE  Final    Comment: (NOTE) SARS-CoV-2 target nucleic acids are NOT DETECTED.  The SARS-CoV-2 RNA is generally detectable in upper respiratory specimens during the acute phase of infection. The lowest concentration of SARS-CoV-2 viral copies this assay can detect is 138 copies/mL. A negative result does not preclude SARS-Cov-2 infection and should not be used as the sole basis for treatment or other patient management decisions. A negative result may occur with  improper specimen collection/handling, submission of specimen other than nasopharyngeal swab, presence of viral mutation(s) within  the areas targeted by this assay, and inadequate number of viral copies(<138 copies/mL). A negative result must be combined with clinical observations, patient history, and epidemiological information. The expected result is Negative.  Fact Sheet for Patients:  EntrepreneurPulse.com.au  Fact Sheet for Healthcare Providers:  IncredibleEmployment.be  This test is no t yet approved or cleared by the [place] FDA and  has been authorized for detection and/or diagnosis of SARS-CoV-2 by FDA under an Emergency Use Authorization (EUA). This EUA will remain  in effect (meaning this test can be used) for the duration of the COVID-19 declaration under Section 564(b)(1) of the Act, 21 U.S.C.section 360bbb-3(b)(1), unless the authorization is terminated  or revoked sooner.       Influenza A by PCR NEGATIVE NEGATIVE Final   Influenza B by PCR NEGATIVE NEGATIVE Final    Comment: (NOTE) The Xpert Xpress SARS-CoV-2/FLU/RSV plus assay is intended as an aid in the diagnosis of influenza from Nasopharyngeal swab specimens and should not be used as a sole basis for treatment. Nasal washings and aspirates are unacceptable for Xpert Xpress SARS-CoV-2/FLU/RSV testing.  Fact Sheet for Patients: EntrepreneurPulse.com.au  Fact Sheet for Healthcare Providers: IncredibleEmployment.be  This test is not yet approved or cleared by the [place] FDA and has been authorized for detection and/or diagnosis of SARS-CoV-2 by FDA under an Emergency Use Authorization (EUA). This EUA will remain in effect (meaning this test can be used) for the duration of the COVID-19 declaration under Section 564(b)(1) of the Act, 21 U.S.C. section 360bbb-3(b)(1), unless the authorization is terminated or revoked.  Performed at [facility], [place] [address] [place], [place] [address]     Studies/Results: No results  found.  Medications: Scheduled Meds: . sodium chloride   Intravenous Once  . buPROPion  150 mg Oral Daily  . citalopram  40 mg Oral Daily  . enoxaparin (LOVENOX) injection  40 mg Subcutaneous Q24H  . hydroxyurea  500 mg Oral BID  . nicotine  21 mg Transdermal Daily  . oxyCODONE  10 mg Oral Q4H  . senna-docusate  1 tablet Oral BID   Continuous Infusions: . sodium chloride 10 mL/hr at 02/18/21 1904   PRN Meds:.acetaminophen, HYDROmorphone (DILAUDID) injection, ondansetron (ZOFRAN) IV, polyethylene glycol  Consultants:  None  Procedures:  None  Antibiotics:  None  Assessment/Plan: Principal Problem:   Sickle cell anemia with crisis (HCC) Active Problems:   Anemia of chronic disease   Tobacco abuse  Sickle cell disease with pain crisis: Continue Dilaudid 1 mg IV every 4 hours as needed for severe breakthrough pain Oxycodone 10 mg every 4 hours as needed Patient has completed Toradol Monitor vital signs very closely, reevaluate pain scale regularly, and supplemental oxygen as needed  Sickle cell anemia: Hemoglobin is stable, slightly below patient's baseline.  There is no clinical indication for further blood transfusion at this time.  Continue to follow closely.  Leukocytosis: Secondary to vaso-occlusive crisis.  No signs of active inflammation  or infection.  No antibiotics warranted at this time.  Continue to follow closely.  History of depression anxiety: Continue home medications.  Patient denies any suicidal or homicidal intent.  Monitor closely.  Tobacco abuse: Smoking cessation counseling given   Code Status: Full Code Family Communication: N/A Disposition Plan: Not yet ready for discharge  [place], MSN, FNP-C Patient [place], [place] [address] [phone]  If 5PM-8AM, please contact night-coverage.  02/19/2021, 9:36 AM  LOS: 5 days

## 2021-02-20 MED ORDER — CITALOPRAM HYDROBROMIDE 40 MG PO TABS: 40.0000 mg | ORAL_TABLET | Freq: Every day | ORAL | 1 refills | Status: DC

## 2021-02-20 MED ORDER — BUPROPION HCL ER (XL) 150 MG PO TB24: 150.0000 mg | ORAL_TABLET | Freq: Every day | ORAL | 1 refills | Status: DC

## 2021-02-20 MED ORDER — OXYCODONE-ACETAMINOPHEN 10-325 MG PO TABS: 1.0000 | ORAL_TABLET | Freq: Four times a day (QID) | ORAL | 0 refills | Status: DC | PRN

## 2021-02-20 MED ORDER — ONDANSETRON HCL 4 MG PO TABS: 4.0000 mg | ORAL_TABLET | Freq: Every day | ORAL | 1 refills | Status: AC | PRN

## 2021-02-20 MED ORDER — HYDROXYUREA 500 MG PO CAPS: 500.0000 mg | ORAL_CAPSULE | Freq: Every day | ORAL | 0 refills | Status: AC

## 2021-02-20 NOTE — Discharge Summary (Signed)
Physician Discharge Summary  Jeffrey Morrison:[id number] DOB: 06/06/1996 DOA: 02/13/2021  PCP: Dorena Dew, FNP  Admit date: 02/13/2021  Discharge date: 02/20/2021  Discharge Diagnoses:  Principal Problem:   Sickle cell anemia with crisis ([place]) Active Problems:   Anemia of chronic disease   Tobacco abuse   Discharge Condition: Stable  Disposition:   Follow-up Information    Dorena Dew, FNP Follow up in 1 week(s).   Specialty: Family Medicine Contact information: [address]. [place] [address] [phone]              Pt is discharged home in good condition and is to follow up with Dorena Dew, FNP this week to have labs evaluated. Jeffrey Morrison is instructed to increase activity slowly and balance with rest for the next few days, and use prescribed medication to complete treatment of pain  Diet: Regular Wt Readings from Last 3 Encounters:  02/15/21 85.8 kg  02/01/21 88 kg  12/23/20 90.5 kg    History of present illness:  Jeffrey Morrison is a 25 year old male with a medical history significant for sickle cell disease that presented to the emergency room with major complaints of generalized body pain that was consistent with his typical sickle cell pain crisis.  Although his pain is generalized, they are more intense in his upper back and both shoulder joints.  He rates his pain as 10/10 at the time of this encounter despite 3 doses of IV Dilaudid in the emergency room.  He is currently living from house to house and he is in a homeless situation.  He denies any current trauma or fall.  He denies any fever.  He cannot really pinpoint any significant inciting factor for this current crisis.  He denies nausea, vomiting, or diarrhea.  No urinary symptoms.  No sick contacts, recent travel, or exposure to COVID-19.  ER course: Updated vital signs: BP 117/64, pulse 81, temperature 98.0 F, respirations 24, and oxygen saturation 100% on RA.   Comprehensive metabolic panel was unremarkable.  WBC 16.1 with a hemoglobin of 7.9, which is around patient's baseline.  Absolute reticulocyte count of around 10%.  He is tested negative for COVID-19.  He remains in significant pain despite repeated doses of Dilaudid in the emergency room.  Patient will be admitted for further evaluation and management of sickle cell pain crisis.  Hospital Course:  Sickle cell disease with pain crisis: Patient was admitted for sickle cell pain crisis and managed appropriately with IVF, IV Dilaudid via PCA and IV Toradol, as well as other adjunct therapies per sickle cell pain management protocols.  Patient was slow to respond to pain medication regimen throughout admission.  IV Dilaudid PCA was weaned appropriately and patient was transitioned to home medications. Patient is currently in a homeless situation and has been to moving from house to house with various family members.  He will discharge home with his aunt who has agreed to let him stay for few weeks until he finds permanent housing.  Patient currently does not have any medications at home.  Percocet 10-325 mg every 6 hours #30 was sent to patient's pharmacy along with hydroxyurea, Zofran, and folic acid. Patient was also advised to continue to hydrate consistently  Sickle cell anemia: Patient's baseline hemoglobin is 8-9 g/dL.  During admission, hemoglobin decreased to 6.6.  Patient was transfused 1 unit PRBCs without complication.  Hemoglobin returned to baseline prior to discharge.  Patient will follow-up with  PCP in 1 week to repeat CBC with differential and for medication management.  History of major depression and anxiety: Remained stable throughout admission.  He currently denies any suicidal homicidal intent.  Patient will resume home medications that include Wellbutrin 150 mg XL and Celexa.  Both medications have been sent to patient's pharmacy.  Jeffrey Morrison is alert, oriented, and ambulating without  assistance.  His oxygen saturation is 100% on RA and he is afebrile.  Patient was therefore discharged home today in a hemodynamically stable condition.   Jeffrey Morrison will follow-up with PCP within 1 week of this discharge. Jeffrey Morrison was counseled extensively about nonpharmacologic means of pain management, patient verbalized understanding and was appreciative of  the care received during this admission.   We discussed the need for good hydration, monitoring of hydration status, avoidance of heat, cold, stress, and infection triggers. We discussed the need to be adherent with taking Hydrea and other home medications. Patient was reminded of the need to seek medical attention immediately if any symptom of bleeding, anemia, or infection occurs.  Discharge Exam: Vitals:   02/20/21 0453 02/20/21 1015  BP: (!) 119/59 116/69  Pulse: 61 (!) 51  Resp: 14 18  Temp: 97.6 F (36.4 C) (!) 97.5 F (36.4 C)  SpO2: 92% 94%   Vitals:   02/19/21 2019 02/20/21 0024 02/20/21 0453 02/20/21 1015  BP: 132/72 (!) 112/51 (!) 119/59 116/69  Pulse: 66 (!) 52 61 (!) 51  Resp: 17 14 14 18   Temp: 98.8 F (37.1 C)  97.6 F (36.4 C) (!) 97.5 F (36.4 C)  TempSrc: Oral  Oral Oral  SpO2: 96% 91% 92% 94%  Weight:        General appearance : Awake, alert, not in any distress. Speech Clear. Not toxic looking HEENT: Atraumatic and Normocephalic, pupils equally reactive to light and accomodation Neck: Supple, no JVD. No cervical lymphadenopathy.  Chest: Good air entry bilaterally, no added sounds  CVS: S1 S2 regular, no murmurs.  Abdomen: Bowel sounds present, Non tender and not distended with no gaurding, rigidity or rebound. Extremities: B/L Lower Ext shows no edema, both legs are warm to touch Neurology: Awake alert, and oriented X 3, CN II-XII intact, Non focal Skin: No Rash  Discharge Instructions   Allergies as of 02/20/2021   No Known Allergies     Medication List    STOP taking these medications    elvitegravir-cobicistat-emtricitabine-tenofovir 150-150-200-10 MG Tabs tablet Commonly known as: GENVOYA   oxyCODONE-acetaminophen 5-325 MG tablet Commonly known as: PERCOCET/ROXICET Replaced by: oxyCODONE-acetaminophen 10-325 MG tablet     TAKE these medications   acetaminophen 325 MG tablet Commonly known as: TYLENOL Take 2,600 mg by mouth every 6 (six) hours as needed for mild pain, fever or headache.   buPROPion 150 MG 24 hr tablet Commonly known as: Wellbutrin XL Take 1 tablet (150 mg total) by mouth daily.   citalopram 40 MG tablet Commonly known as: CELEXA Take 1 tablet (40 mg total) by mouth daily.   hydroxyurea 500 MG capsule Commonly known as: HYDREA Take 1 capsule (500 mg total) by mouth daily. May take with food to minimize GI side effects. What changed: when to take this   ibuprofen 200 MG tablet Commonly known as: ADVIL Take 1,200 mg by mouth every 6 (six) hours as needed for headache, fever or mild pain.   nystatin 100000 UNIT/ML suspension Commonly known as: MYCOSTATIN Take 5 mLs (500,000 Units total) by mouth 4 (four) times daily.  ondansetron 4 MG tablet Commonly known as: Zofran Take 1 tablet (4 mg total) by mouth daily as needed for nausea or vomiting.   oxyCODONE-acetaminophen 10-325 MG tablet Commonly known as: Percocet Take 1 tablet by mouth every 6 (six) hours as needed for pain. Replaces: oxyCODONE-acetaminophen 5-325 MG tablet   permethrin 5 % cream Commonly known as: ELIMITE Apply thoroughly into the skin from your neck to the soles of your feet including the areas under your finger nails. Leave it on for 8-14 hours and then wash it off in the shower. Do the same thing in 1-2 weeks later.       The results of significant diagnostics from this hospitalization (including imaging, microbiology, ancillary and laboratory) are listed below for reference.    Significant Diagnostic Studies: DG Chest 2 View  Result Date: 02/19/2021 CLINICAL  DATA:  Sickle cell crisis. EXAM: CHEST - 2 VIEW COMPARISON:  December 20, 2020. FINDINGS: The heart size and mediastinal contours are within normal limits. Both lungs are clear. 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 IMPRESSION: No active cardiopulmonary disease. Electronically Signed   By: Marijo Conception M.D.   On: 02/19/2021 15:30    Microbiology: Recent Results (from the past 240 hour(s))  Resp Panel by RT-PCR (Flu A&B, Covid) Nasopharyngeal Swab     Status: None   Collection Time: 02/13/21  2:55 PM   Specimen: Nasopharyngeal Swab; Nasopharyngeal(NP) swabs in vial transport medium  Result Value Ref Range Status   SARS Coronavirus 2 by RT PCR NEGATIVE NEGATIVE Final    Comment: (NOTE) SARS-CoV-2 target nucleic acids are NOT DETECTED.  The SARS-CoV-2 RNA is generally detectable in upper respiratory specimens during the acute phase of infection. The lowest concentration of SARS-CoV-2 viral copies this assay can detect is 138 copies/mL. A negative result does not preclude SARS-Cov-2 infection and should not be used as the sole basis for treatment or other patient management decisions. A negative result may occur with  improper specimen collection/handling, submission of specimen other than nasopharyngeal swab, presence of viral mutation(s) within the areas targeted by this assay, and inadequate number of viral copies(<138 copies/mL). A negative result must be combined with clinical observations, patient history, and epidemiological information. The expected result is Negative.  Fact Sheet for Patients:  EntrepreneurPulse.com.au  Fact Sheet for Healthcare Providers:  IncredibleEmployment.be  This test is no t yet approved or cleared by the [place] FDA and  has been authorized for detection and/or diagnosis of SARS-CoV-2 by FDA under an Emergency Use Authorization (EUA). This  EUA will remain  in effect (meaning this test can be used) for the duration of the COVID-19 declaration under Section 564(b)(1) of the Act, 21 U.S.C.section 360bbb-3(b)(1), unless the authorization is terminated  or revoked sooner.       Influenza A by PCR NEGATIVE NEGATIVE Final   Influenza B by PCR NEGATIVE NEGATIVE Final    Comment: (NOTE) The Xpert Xpress SARS-CoV-2/FLU/RSV plus assay is intended as an aid in the diagnosis of influenza from Nasopharyngeal swab specimens and should not be used as a sole basis for treatment. Nasal washings and aspirates are unacceptable for Xpert Xpress SARS-CoV-2/FLU/RSV testing.  Fact Sheet for Patients: EntrepreneurPulse.com.au  Fact Sheet for Healthcare Providers: IncredibleEmployment.be  This test is not yet approved or cleared by the [place] FDA and has been authorized for detection and/or diagnosis of SARS-CoV-2 by FDA under an Emergency Use Authorization (EUA). This EUA will  remain in effect (meaning this test can be used) for the duration of the COVID-19 declaration under Section 564(b)(1) of the Act, 21 U.S.C. section 360bbb-3(b)(1), unless the authorization is terminated or revoked.  Performed at [facility], [place] [address], [place], [place] [address]      Labs: Basic Metabolic Panel: Recent Labs  Lab 02/17/21 0533 02/19/21 1754  NA 135 138  K 4.3 4.8  CL 101 104  CO2 27 28  GLUCOSE 89 96  BUN 11 9  CREATININE 0.84 0.83  CALCIUM 8.5* 8.8*   Liver Function Tests: Recent Labs  Lab 02/17/21 0533  AST 74*  ALT 61*  ALKPHOS 92  BILITOT 4.8*  PROT 6.9  ALBUMIN 3.5   No results for input(s): LIPASE, AMYLASE in the last 168 hours. No results for input(s): AMMONIA in the last 168 hours. CBC: Recent Labs  Lab 02/14/21 0538 02/17/21 0533 02/18/21 0846 02/19/21 1754  WBC 12.9* 6.6  --  13.5*  NEUTROABS 7.1  --   --   --   HGB 6.6* 6.1* 6.9* 7.8*   HCT 18.9* 16.8* 19.2* 21.9*  MCV 97.9 96.0  --  96.1  PLT 333 321  --  361   Cardiac Enzymes: No results for input(s): CKTOTAL, CKMB, CKMBINDEX, TROPONINI in the last 168 hours. BNP: Invalid input(s): POCBNP CBG: No results for input(s): GLUCAP in the last 168 hours.  Time coordinating discharge: 35 minutes  Signed:   Donia Pounds  APRN, MSN, FNP-C Patient [place], [place] [address] [phone]  [facility] 02/20/2021, 2:42 PM

## 2021-02-20 NOTE — Discharge Instructions (Signed)
Sickle Cell Anemia, Adult  Sickle cell anemia is a condition where your red blood cells are shaped like sickles. Red blood cells carry oxygen through the body. Sickle-shaped cells do not live as long as normal red blood cells. They also clump together and block blood from flowing through the blood vessels. This prevents the body from getting enough oxygen. Sickle cell anemia causes organ damage and pain. It also increases the risk of infection. Follow these instructions at home: Medicines  Take over-the-counter and prescription medicines only as told by your doctor.  If you were prescribed an antibiotic medicine, take it as told by your doctor. Do not stop taking the antibiotic even if you start to feel better.  If you develop a fever, do not take medicines to lower the fever right away. Tell your doctor about the fever. Managing pain, stiffness, and swelling  Try these methods to help with pain: ? Use a heating pad. ? Take a warm bath. ? Distract yourself, such as by watching TV. Eating and drinking  Drink enough fluid to keep your pee (urine) clear or pale yellow. Drink more in hot weather and during exercise.  Limit or avoid alcohol.  Eat a healthy diet. Eat plenty of fruits, vegetables, whole grains, and lean protein.  Take vitamins and supplements as told by your doctor. Traveling  When traveling, keep these with you: ? Your medical information. ? The names of your doctors. ? Your medicines.  If you need to take an airplane, talk to your doctor first. Activity  Rest often.  Avoid exercises that make your heart beat much faster, such as jogging. General instructions  Do not use products that have nicotine or tobacco, such as cigarettes and e-cigarettes. If you need help quitting, ask your doctor.  Consider wearing a medical alert bracelet.  Avoid being in high places (high altitudes), such as mountains.  Avoid very hot or cold temperatures.  Avoid places where the  temperature changes a lot.  Keep all follow-up visits as told by your doctor. This is important. Contact a doctor if:  A joint hurts.  Your feet or hands hurt or swell.  You feel tired (fatigued). Get help right away if:  You have symptoms of infection. These include: ? Fever. ? Chills. ? Being very tired. ? Irritability. ? Poor eating. ? Throwing up (vomiting).  You feel dizzy or faint.  You have new stomach pain, especially on the left side.  You have a an erection (priapism) that lasts more than 4 hours.  You have numbness in your arms or legs.  You have a hard time moving your arms or legs.  You have trouble talking.  You have pain that does not go away when you take medicine.  You are short of breath.  You are breathing fast.  You have a long-term cough.  You have pain in your chest.  You have a bad headache.  You have a stiff neck.  Your stomach looks bloated even though you did not eat much.  Your skin is pale.  You suddenly cannot see well. Summary  Sickle cell anemia is a condition where your red blood cells are shaped like sickles.  Follow your doctor's advice on ways to manage pain, food to eat, activities to do, and steps to take for safe travel.  Get medical help right away if you have any signs of infection, such as a fever. This information is not intended to replace advice given to you by   your health care provider. Make sure you discuss any questions you have with your health care provider. Document Revised: 01/26/2020 Document Reviewed: 01/26/2020 Elsevier Patient Education  2021 Elsevier Inc.  

## 2021-02-25 ENCOUNTER — Other Ambulatory Visit: Payer: Self-pay | Admitting: Family Medicine

## 2021-02-25 ENCOUNTER — Telehealth: Payer: Self-pay

## 2021-02-25 DIAGNOSIS — D5700 Hb-SS disease with crisis, unspecified: Secondary | ICD-10-CM

## 2021-02-25 MED ORDER — OXYCODONE-ACETAMINOPHEN 10-325 MG PO TABS: 1.0000 | ORAL_TABLET | Freq: Four times a day (QID) | ORAL | 0 refills | Status: AC | PRN

## 2021-02-25 NOTE — Telephone Encounter (Signed)
Oxycodone  °

## 2021-02-25 NOTE — Telephone Encounter (Signed)
Tried calling the pt multiple time and someone would answer and HU couldn't make an appt

## 2021-02-25 NOTE — Progress Notes (Signed)
Reviewed PDMP substance reporting system prior to prescribing opiate medications. No inconsistencies noted.  Meds ordered this encounter  Medications   oxyCODONE-acetaminophen (PERCOCET) 10-325 MG tablet    Sig: Take 1 tablet by mouth every 6 (six) hours as needed for pain.    Dispense:  30 tablet    Refill:  0    Order Specific Question:   Supervising Provider    Answer:   [name] [[id number]]     Donia Pounds  APRN, MSN, FNP-C [facility] [place] [address] [place], [place] [address] [phone]

## 2021-03-03 ENCOUNTER — Emergency Department (HOSPITAL_COMMUNITY)
Admission: EM | Admit: 2021-03-03 | Discharge: 2021-03-03 | Disposition: A | Discharge status: Home / Self Care | Payer: Medicaid Other | Attending: Emergency Medicine | Admitting: Emergency Medicine

## 2021-03-03 ENCOUNTER — Other Ambulatory Visit: Payer: Self-pay

## 2021-03-03 DIAGNOSIS — W1781XA Fall down embankment (hill), initial encounter: Secondary | ICD-10-CM | POA: Insufficient documentation

## 2021-03-03 DIAGNOSIS — Z5321 Procedure and treatment not carried out due to patient leaving prior to being seen by health care provider: Secondary | ICD-10-CM | POA: Insufficient documentation

## 2021-03-03 DIAGNOSIS — Y9302 Activity, running: Secondary | ICD-10-CM | POA: Diagnosis not present

## 2021-03-03 DIAGNOSIS — M546 Pain in thoracic spine: Secondary | ICD-10-CM | POA: Diagnosis not present

## 2021-03-03 NOTE — ED Triage Notes (Signed)
Pt here via [facility] in [employer] custody, pt was running from [employer] and fell down a hill, c/o mid back pain.

## 2021-03-03 NOTE — ED Notes (Signed)
[employer] released pt from custody

## 2021-03-03 NOTE — ED Notes (Signed)
Pt left AMA °

## 2021-03-21 ENCOUNTER — Emergency Department (HOSPITAL_COMMUNITY)
Admission: EM | Admit: 2021-03-21 | Discharge: 2021-03-21 | Disposition: A | Discharge status: Home / Self Care | Payer: Medicaid Other | Attending: Emergency Medicine | Admitting: Emergency Medicine

## 2021-03-21 ENCOUNTER — Encounter (HOSPITAL_COMMUNITY): Payer: Self-pay | Admitting: Emergency Medicine

## 2021-03-21 ENCOUNTER — Other Ambulatory Visit: Payer: Self-pay

## 2021-03-21 DIAGNOSIS — M79672 Pain in left foot: Secondary | ICD-10-CM | POA: Diagnosis not present

## 2021-03-21 DIAGNOSIS — Z5321 Procedure and treatment not carried out due to patient leaving prior to being seen by health care provider: Secondary | ICD-10-CM | POA: Diagnosis not present

## 2021-03-21 DIAGNOSIS — M79671 Pain in right foot: Secondary | ICD-10-CM | POA: Insufficient documentation

## 2021-03-21 NOTE — ED Provider Notes (Signed)
Emergency Medicine Provider Triage Evaluation Note  Jeffrey Morrison , a 25 y.o. male  was evaluated in triage.  Pt complains of bilateral foot pain and drainage. Symptoms present x 2 weeks. Has been using peroxide for symptoms, but generally has had poor hygiene secondary to homelessness.  Review of Systems  Positive: Foot pain Negative: Fever  Physical Exam  Ht 5\' 9"  (1.753 m)   Wt 77.1 kg   BMI 25.10 kg/m  Gen:   Awake, no distress   Resp:  Normal effort  MSK:   Moves extremities without difficulty  Other:  B/l feet are foul smelling. Scattered calluses to plantar feet. Areas of skin maceration. Compartments soft of b/l feet. No heat to touch, induration, lymphangitic streaking.   Medical Decision Making  Medically screening exam initiated at 12:52 AM.  Appropriate orders placed.  Jeffrey Morrison was informed that the remainder of the evaluation will be completed by another provider, this initial triage assessment does not replace that evaluation, and the importance of remaining in the ED until their evaluation is complete.  Foot pain Homelessness   Antonietta Breach, PA-C 03/21/21 0055    Ezequiel Essex, MD 03/21/21 [phone]

## 2021-03-21 NOTE — ED Triage Notes (Signed)
Pt presents to ED POV. Pt c/o bilateral foot pain, drainage, and foul odor x2w. Pt reports began in between toes but has worsened. Pt reports using peroxide at home.

## 2021-04-28 ENCOUNTER — Encounter (HOSPITAL_COMMUNITY): Payer: Self-pay | Admitting: *Deleted

## 2021-04-28 ENCOUNTER — Emergency Department (HOSPITAL_COMMUNITY)
Admission: EM | Admit: 2021-04-28 | Discharge: 2021-04-28 | Disposition: A | Discharge status: Home / Self Care | Payer: Medicaid Other | Attending: Medical | Admitting: Medical

## 2021-04-28 ENCOUNTER — Ambulatory Visit (HOSPITAL_COMMUNITY)
Admission: EM | Admit: 2021-04-28 | Discharge: 2021-04-28 | Disposition: A | Discharge status: Home / Self Care | Payer: Medicaid Other

## 2021-04-28 ENCOUNTER — Encounter (HOSPITAL_COMMUNITY): Payer: Self-pay | Admitting: Emergency Medicine

## 2021-04-28 ENCOUNTER — Other Ambulatory Visit: Payer: Self-pay

## 2021-04-28 DIAGNOSIS — R21 Rash and other nonspecific skin eruption: Secondary | ICD-10-CM | POA: Insufficient documentation

## 2021-04-28 DIAGNOSIS — Z5900 Homelessness unspecified: Secondary | ICD-10-CM

## 2021-04-28 DIAGNOSIS — N4830 Priapism, unspecified: Secondary | ICD-10-CM

## 2021-04-28 DIAGNOSIS — D5700 Hb-SS disease with crisis, unspecified: Secondary | ICD-10-CM | POA: Diagnosis not present

## 2021-04-28 DIAGNOSIS — N50819 Testicular pain, unspecified: Secondary | ICD-10-CM | POA: Diagnosis not present

## 2021-04-28 DIAGNOSIS — F1490 Cocaine use, unspecified, uncomplicated: Secondary | ICD-10-CM

## 2021-04-28 DIAGNOSIS — K920 Hematemesis: Secondary | ICD-10-CM

## 2021-04-28 DIAGNOSIS — Z5321 Procedure and treatment not carried out due to patient leaving prior to being seen by health care provider: Secondary | ICD-10-CM | POA: Insufficient documentation

## 2021-04-28 LAB — RAPID URINE DRUG SCREEN, HOSP PERFORMED
Amphetamines: NOT DETECTED
Barbiturates: NOT DETECTED
Benzodiazepines: NOT DETECTED
Cocaine: POSITIVE — AB
Opiates: NOT DETECTED
Tetrahydrocannabinol: POSITIVE — AB

## 2021-04-28 LAB — CBC WITH DIFFERENTIAL/PLATELET
Abs Immature Granulocytes: 0.16 10*3/uL — ABNORMAL HIGH (ref 0.00–0.07)
Basophils Absolute: 0.1 10*3/uL (ref 0.0–0.1)
Basophils Relative: 1 %
Eosinophils Absolute: 0.2 10*3/uL (ref 0.0–0.5)
Eosinophils Relative: 1 %
HCT: 25.7 % — ABNORMAL LOW (ref 39.0–52.0)
Hemoglobin: 8.4 g/dL — ABNORMAL LOW (ref 13.0–17.0)
Immature Granulocytes: 1 %
Lymphocytes Relative: 27 %
Lymphs Abs: 4.9 10*3/uL — ABNORMAL HIGH (ref 0.7–4.0)
MCH: 35.4 pg — ABNORMAL HIGH (ref 26.0–34.0)
MCHC: 32.7 g/dL (ref 30.0–36.0)
MCV: 108.4 fL — ABNORMAL HIGH (ref 80.0–100.0)
Monocytes Absolute: 1.6 10*3/uL — ABNORMAL HIGH (ref 0.1–1.0)
Monocytes Relative: 8 %
Neutro Abs: 11.5 10*3/uL — ABNORMAL HIGH (ref 1.7–7.7)
Neutrophils Relative %: 62 %
Platelets: 389 10*3/uL (ref 150–400)
RBC: 2.37 MIL/uL — ABNORMAL LOW (ref 4.22–5.81)
RDW: 22.2 % — ABNORMAL HIGH (ref 11.5–15.5)
WBC: 18.4 10*3/uL — ABNORMAL HIGH (ref 4.0–10.5)
nRBC: 5.9 % — ABNORMAL HIGH (ref 0.0–0.2)

## 2021-04-28 LAB — URINALYSIS, ROUTINE W REFLEX MICROSCOPIC
Bacteria, UA: NONE SEEN
Bilirubin Urine: NEGATIVE
Glucose, UA: NEGATIVE mg/dL
Hgb urine dipstick: NEGATIVE
Ketones, ur: NEGATIVE mg/dL
Nitrite: NEGATIVE
Protein, ur: 100 mg/dL — AB
Specific Gravity, Urine: 1.014 (ref 1.005–1.030)
pH: 5 (ref 5.0–8.0)

## 2021-04-28 LAB — COMPREHENSIVE METABOLIC PANEL
ALT: 15 U/L (ref 0–44)
AST: 31 U/L (ref 15–41)
Albumin: 4.4 g/dL (ref 3.5–5.0)
Alkaline Phosphatase: 67 U/L (ref 38–126)
Anion gap: 6 (ref 5–15)
BUN: 11 mg/dL (ref 6–20)
CO2: 26 mmol/L (ref 22–32)
Calcium: 9.3 mg/dL (ref 8.9–10.3)
Chloride: 106 mmol/L (ref 98–111)
Creatinine, Ser: 0.88 mg/dL (ref 0.61–1.24)
GFR, Estimated: >60 mL/min (ref >60)
Glucose, Bld: 97 mg/dL (ref 70–99)
Potassium: 3.9 mmol/L (ref 3.5–5.1)
Sodium: 138 mmol/L (ref 135–145)
Total Bilirubin: 2.8 mg/dL — ABNORMAL HIGH (ref 0.3–1.2)
Total Protein: 7.7 g/dL (ref 6.5–8.1)

## 2021-04-28 LAB — TYPE AND SCREEN
ABO/RH(D): A POS
Antibody Screen: POSITIVE
DAT, IgG: POSITIVE

## 2021-04-28 LAB — RETICULOCYTES: RBC.: 2.37 MIL/uL — ABNORMAL LOW (ref 4.22–5.81)

## 2021-04-28 NOTE — ED Notes (Signed)
Called pt again for updated vitals, no response.

## 2021-04-28 NOTE — Discharge Instructions (Addendum)
-  head to ED

## 2021-04-28 NOTE — ED Triage Notes (Signed)
Pt is present today with bumps all over his body (arms legs, abdomen, feet, etc), vomiting blood, and foot issues. Pt states that he did use cocaine today that may have contain fentanyl. Pt states that he is hearing people in his head after using cocaine today. Pt also states that he has had an erection all day and is experiencing pain

## 2021-04-28 NOTE — ED Provider Notes (Signed)
[place]    CSN: [id number] Arrival date & time: 04/28/21  1402      History   Chief Complaint Chief Complaint  Patient presents with   Emesis   Mass    HPI Jeffrey Morrison is a 25 y.o. male presenting with hematemesis, abdominal pain, skin itching, priapism, cocaine use.  Medical history sickle cell anemia, homelessness, substance abuse.  Endorses "many" episodes of bright red blood in his vomit in the last 2 days, denies nausea but does endorse some epigastric abdominal pain.  Denies radiation of pain to back.  Denies diarrhea, fever/chills, coughing.  Also with priapism for about 2 days, states that he is no longer erect but the scrotum hurts.  Able to pass urine.  Denies chest pain, dizziness, shortness of breath, weakness.  HPI  Past Medical History:  Diagnosis Date   Chronic lower back pain    Depression    Sickle cell crisis ([place])    Sickle cell disease ([place])     Patient Active Problem List   Diagnosis Date Noted   Sickle cell anemia with crisis ([place]) 02/13/2021   Transaminitis 11/12/2018   Tobacco abuse 11/12/2018   LFT elevation    Total bilirubin, elevated    Low oxygen saturation    Agitation    Community acquired pneumonia    Polysubstance abuse ([place]) 07/12/2018   Homelessness 07/12/2018   Wheezing on expiration 07/12/2018   Cough 07/12/2018   Dysuria 07/12/2018   Hb-SS disease with crisis ([place])    Wheezing    Sickle cell crisis ([place]) 07/10/2018   Sickle cell pain crisis ([place]) 04/21/2018   CMV (cytomegalovirus) ([place]) 08/27/2017   Erectile dysfunction 08/27/2017   Depression 08/25/2017   OSA (obstructive sleep apnea)    Anemia of chronic disease     Past Surgical History:  Procedure Laterality Date   SPLENECTOMY         Home Medications    Prior to Admission medications   Medication Sig Start Date End Date Taking? Authorizing Provider  acetaminophen (TYLENOL) 325 MG tablet Take 2,600 mg by mouth every 6 (six) hours as needed  for mild pain, fever or headache.    [provider]  buPROPion (WELLBUTRIN XL) 150 MG 24 hr tablet Take 1 tablet (150 mg total) by mouth daily. 02/20/21   Dorena Dew, FNP  citalopram (CELEXA) 40 MG tablet Take 1 tablet (40 mg total) by mouth daily. 02/20/21   Dorena Dew, FNP  hydroxyurea (HYDREA) 500 MG capsule Take 1 capsule (500 mg total) by mouth daily. May take with food to minimize GI side effects. 02/20/21   Dorena Dew, FNP  ibuprofen (ADVIL) 200 MG tablet Take 1,200 mg by mouth every 6 (six) hours as needed for headache, fever or mild pain.    [provider]  nystatin (MYCOSTATIN) 100000 UNIT/ML suspension Take 5 mLs (500,000 Units total) by mouth 4 (four) times daily. 12/23/20   Elwyn Reach, MD  ondansetron (ZOFRAN) 4 MG tablet Take 1 tablet (4 mg total) by mouth daily as needed for nausea or vomiting. 02/20/21 02/20/22  Dorena Dew, FNP  oxyCODONE-acetaminophen (PERCOCET) 10-325 MG tablet Take 1 tablet by mouth every 6 (six) hours as needed for pain. 02/25/21 02/25/22  Dorena Dew, FNP  permethrin (ELIMITE) 5 % cream Apply thoroughly into the skin from your neck to the soles of your feet including the areas under your finger nails. Leave it on for 8-14 hours and  then wash it off in the shower. Do the same thing in 1-2 weeks later. 02/01/21   Henderly, Britni A, PA-C    Family History Family History  Problem Relation Age of Onset   Diabetes Mother     Social History Social History   Tobacco Use   Smoking status: Every Day    Packs/day: 0.50    Types: Cigarettes   Smokeless tobacco: Never  Vaping Use   Vaping Use: Never used  Substance Use Topics   Alcohol use: No   Drug use: Yes    Types: IV, Methamphetamines    Comment: heroin      Allergies   Patient has no known allergies.   Review of Systems Review of Systems  Constitutional:  Negative for appetite change, chills, diaphoresis, fever and unexpected weight change.  HENT:   Negative for congestion, ear pain, sinus pressure, sinus pain, sneezing, sore throat and trouble swallowing.   Respiratory:  Negative for cough, chest tightness and shortness of breath.   Cardiovascular:  Negative for chest pain.  Gastrointestinal:  Positive for abdominal pain and vomiting. Negative for abdominal distention, anal bleeding, blood in stool, constipation, diarrhea, nausea and rectal pain.  Genitourinary:  Negative for dysuria, flank pain, frequency and urgency.  Musculoskeletal:  Negative for back pain and myalgias.  Neurological:  Negative for dizziness, light-headedness and headaches.  All other systems reviewed and are negative.   Physical Exam Triage Vital Signs ED Triage Vitals [04/28/21 1418]  Enc Vitals Group     BP      Pulse      Resp      Temp      Temp src      SpO2      Weight      Height      Head Circumference      Peak Flow      Pain Score 7     Pain Loc      Pain Edu?      Excl. in [place]?    No data found.  Updated Vital Signs There were no vitals taken for this visit.  Visual Acuity Right Eye Distance:   Left Eye Distance:   Bilateral Distance:    Right Eye Near:   Left Eye Near:    Bilateral Near:     Physical Exam Vitals reviewed.  Constitutional:      General: He is not in acute distress.    Appearance: Normal appearance. He is not ill-appearing.  HENT:     Head: Normocephalic and atraumatic.     Mouth/Throat:     Mouth: Mucous membranes are moist.     Comments: Moist mucous membranes Eyes:     Extraocular Movements: Extraocular movements intact.     Pupils: Pupils are equal, round, and reactive to light.  Cardiovascular:     Rate and Rhythm: Normal rate and regular rhythm.     Heart sounds: Normal heart sounds.  Pulmonary:     Effort: Pulmonary effort is normal.     Breath sounds: Normal breath sounds. No wheezing, rhonchi or rales.  Abdominal:     General: Bowel sounds are normal. There is no distension.     Palpations:  Abdomen is soft. There is no mass.     Tenderness: There is abdominal tenderness in the epigastric area. There is no right CVA tenderness, left CVA tenderness, guarding or rebound.  Skin:    General: Skin is warm.     Capillary  Refill: Capillary refill takes less than 2 seconds.     Comments: Good skin turgor  Neurological:     General: No focal deficit present.     Mental Status: He is alert and oriented to person, place, and time.  Psychiatric:        Mood and Affect: Mood normal.        Behavior: Behavior normal.     UC Treatments / Results  Labs (all labs ordered are listed, but only abnormal results are displayed) Labs Reviewed - No data to display  EKG   Radiology No results found.  Procedures Procedures (including critical care time)  Medications Ordered in UC Medications - No data to display  Initial Impression / Assessment and Plan / UC Course  I have reviewed the triage vital signs and the nursing notes.  Pertinent labs & imaging results that were available during my care of the patient were reviewed by me and considered in my medical decision making (see chart for details).     This patient is a very pleasant 25 y.o. year old male presenting with hematemesis; priapism; cocaine use; sickle cell crisis. This patient is homeless.  1 day of hematemesis, priapism; states symptoms are consistent with sickle cell crisis.  Also notes cocaine use about 2 hours ago, states he thinks this was laced with fentanyl.  Sent to ED- pt states he will walk across parking lot to [facility].  Level 5 due to likely admission.   Final Clinical Impressions(s) / UC Diagnoses   Final diagnoses:  Hematemesis without nausea  Priapism  Cocaine use  Homelessness  Sickle cell crisis [facility])     Discharge Instructions      -head to ED   ED Prescriptions   None    PDMP not reviewed this encounter.   Hazel Sams, PA-C 04/28/21 1435

## 2021-04-28 NOTE — ED Notes (Signed)
Called pt again to update vitals. No response.

## 2021-04-28 NOTE — ED Notes (Signed)
Patient is being discharged from the Urgent Care and sent to the Emergency Department via urgent care staff . Per Marin Roberts PA, patient is in need of higher level of care due to vomiting blood . Patient is aware and verbalizes understanding of plan of care. There were no vitals filed for this visit.

## 2021-04-28 NOTE — ED Provider Notes (Signed)
Emergency Medicine Provider Triage Evaluation Note  Jeffrey Morrison , a 25 y.o. male  was evaluated in triage.  Pt with multiple complaints. He reports that he has had a diffuse itching rash for "a minute." He also complains of hematemesis that began yesterday as well as bilateral feet pain/toenails falling off. Pt also mentions having an erection all day yesterday and now having some pain in that general area; no longer erect.   Review of Systems  Positive: + rash, hematemesis, priapism, scrotal pain Negative: - diarrhea  Physical Exam  BP 105/65 (BP Location: Right Arm)   Pulse 60   Temp 97.8 F (36.6 C)   Resp 18   Ht '5\' 9"'$  (1.753 m)   Wt 77.1 kg   SpO2 100%   BMI 25.10 kg/m  Gen:   Awake, no distress   Resp:  Normal effort  MSK:   Moves extremities without difficulty  Other:  Mild epigastric abdominal TTP. Excoriations throughout body, patient actively scratching.   Medical Decision Making  Medically screening exam initiated at 3:09 PM.  Appropriate orders placed.  Jeffrey Morrison was informed that the remainder of the evaluation will be completed by another provider, this initial triage assessment does not replace that evaluation, and the importance of remaining in the ED until their evaluation is complete.  Sent from UC with multiple complaints. Sickle cell patient.    Eustaquio Maize, PA-C 04/28/21 [place], DO 04/28/21 2207

## 2021-04-28 NOTE — ED Triage Notes (Signed)
Rash over his body for months  vomitibg aince yesterday

## 2021-04-28 NOTE — ED Notes (Signed)
Called to update vitals. No response.

## 2022-06-26 ENCOUNTER — Other Ambulatory Visit: Payer: Self-pay

## 2022-06-26 ENCOUNTER — Inpatient Hospital Stay (HOSPITAL_COMMUNITY)
Admission: EM | Admit: 2022-06-26 | Discharge: 2022-07-04 | DRG: 812 | Attending: Internal Medicine | Admitting: Internal Medicine

## 2022-06-26 ENCOUNTER — Encounter (HOSPITAL_COMMUNITY): Payer: Self-pay | Admitting: *Deleted

## 2022-06-26 DIAGNOSIS — G894 Chronic pain syndrome: Secondary | ICD-10-CM | POA: Diagnosis present

## 2022-06-26 DIAGNOSIS — D5700 Hb-SS disease with crisis, unspecified: Secondary | ICD-10-CM | POA: Diagnosis not present

## 2022-06-26 DIAGNOSIS — Z23 Encounter for immunization: Secondary | ICD-10-CM

## 2022-06-26 DIAGNOSIS — Z87891 Personal history of nicotine dependence: Secondary | ICD-10-CM

## 2022-06-26 DIAGNOSIS — L03116 Cellulitis of left lower limb: Secondary | ICD-10-CM | POA: Diagnosis present

## 2022-06-26 DIAGNOSIS — L97919 Non-pressure chronic ulcer of unspecified part of right lower leg with unspecified severity: Secondary | ICD-10-CM | POA: Diagnosis not present

## 2022-06-26 DIAGNOSIS — L03115 Cellulitis of right lower limb: Secondary | ICD-10-CM | POA: Diagnosis present

## 2022-06-26 DIAGNOSIS — L97929 Non-pressure chronic ulcer of unspecified part of left lower leg with unspecified severity: Secondary | ICD-10-CM | POA: Diagnosis not present

## 2022-06-26 DIAGNOSIS — D638 Anemia in other chronic diseases classified elsewhere: Secondary | ICD-10-CM | POA: Diagnosis present

## 2022-06-26 DIAGNOSIS — Z9181 History of falling: Secondary | ICD-10-CM | POA: Diagnosis not present

## 2022-06-26 DIAGNOSIS — Z9081 Acquired absence of spleen: Secondary | ICD-10-CM | POA: Diagnosis not present

## 2022-06-26 DIAGNOSIS — R7401 Elevation of levels of liver transaminase levels: Secondary | ICD-10-CM | POA: Diagnosis not present

## 2022-06-26 DIAGNOSIS — D75839 Thrombocytosis, unspecified: Secondary | ICD-10-CM | POA: Diagnosis present

## 2022-06-26 DIAGNOSIS — Z79899 Other long term (current) drug therapy: Secondary | ICD-10-CM

## 2022-06-26 LAB — HEPATIC FUNCTION PANEL
ALT: 97 U/L — ABNORMAL HIGH (ref 0–44)
AST: 67 U/L — ABNORMAL HIGH (ref 15–41)
Albumin: 4.1 g/dL (ref 3.5–5.0)
Alkaline Phosphatase: 111 U/L (ref 38–126)
Bilirubin, Direct: 0.5 mg/dL — ABNORMAL HIGH (ref 0.0–0.2)
Indirect Bilirubin: 1.5 mg/dL — ABNORMAL HIGH (ref 0.3–0.9)
Total Bilirubin: 2 mg/dL — ABNORMAL HIGH (ref 0.3–1.2)
Total Protein: 8.2 g/dL — ABNORMAL HIGH (ref 6.5–8.1)

## 2022-06-26 LAB — CBC WITH DIFFERENTIAL/PLATELET
Abs Immature Granulocytes: 0.26 10*3/uL — ABNORMAL HIGH (ref 0.00–0.07)
Basophils Absolute: 0.1 10*3/uL (ref 0.0–0.1)
Basophils Relative: 0 %
Eosinophils Absolute: 0 10*3/uL (ref 0.0–0.5)
Eosinophils Relative: 0 %
HCT: 25.7 % — ABNORMAL LOW (ref 39.0–52.0)
Hemoglobin: 8.9 g/dL — ABNORMAL LOW (ref 13.0–17.0)
Immature Granulocytes: 1 %
Lymphocytes Relative: 4 %
Lymphs Abs: 0.9 10*3/uL (ref 0.7–4.0)
MCH: 33.6 pg (ref 26.0–34.0)
MCHC: 34.6 g/dL (ref 30.0–36.0)
MCV: 97 fL (ref 80.0–100.0)
Monocytes Absolute: 2 10*3/uL — ABNORMAL HIGH (ref 0.1–1.0)
Monocytes Relative: 9 %
Neutro Abs: 19.3 10*3/uL — ABNORMAL HIGH (ref 1.7–7.7)
Neutrophils Relative %: 86 %
Platelets: 447 10*3/uL — ABNORMAL HIGH (ref 150–400)
RBC: 2.65 MIL/uL — ABNORMAL LOW (ref 4.22–5.81)
RDW: 20.4 % — ABNORMAL HIGH (ref 11.5–15.5)
WBC: 22.5 10*3/uL — ABNORMAL HIGH (ref 4.0–10.5)
nRBC: 5.8 % — ABNORMAL HIGH (ref 0.0–0.2)

## 2022-06-26 LAB — BASIC METABOLIC PANEL
Anion gap: 6 (ref 5–15)
BUN: 14 mg/dL (ref 6–20)
CO2: 25 mmol/L (ref 22–32)
Calcium: 9.1 mg/dL (ref 8.9–10.3)
Chloride: 105 mmol/L (ref 98–111)
Creatinine, Ser: 0.89 mg/dL (ref 0.61–1.24)
GFR, Estimated: >60 mL/min (ref >60)
Glucose, Bld: 125 mg/dL — ABNORMAL HIGH (ref 70–99)
Potassium: 4 mmol/L (ref 3.5–5.1)
Sodium: 136 mmol/L (ref 135–145)

## 2022-06-26 LAB — RETICULOCYTES
Immature Retic Fract: 31 % — ABNORMAL HIGH (ref 2.3–15.9)
RBC.: 2.7 MIL/uL — ABNORMAL LOW (ref 4.22–5.81)
Retic Count, Absolute: 290.5 10*3/uL — ABNORMAL HIGH (ref 19.0–186.0)
Retic Ct Pct: 11.2 % — ABNORMAL HIGH (ref 0.4–3.1)

## 2022-06-26 MED ORDER — LACTATED RINGERS IV BOLUS
1000.0000 mL | Freq: Once | INTRAVENOUS | Status: AC
  Administered 2022-06-26: 1000 mL via INTRAVENOUS

## 2022-06-26 MED ORDER — HYDROMORPHONE 1 MG/ML IV SOLN
INTRAVENOUS | Status: DC
  Administered 2022-06-26: 30 mg via INTRAVENOUS
  Administered 2022-06-27: 2.4 mg via INTRAVENOUS
  Administered 2022-06-27: 1.5 mg via INTRAVENOUS
  Administered 2022-06-27: 1.8 mg via INTRAVENOUS
  Administered 2022-06-27: 3.3 mg via INTRAVENOUS
  Administered 2022-06-27: 1.2 mg via INTRAVENOUS
  Administered 2022-06-27: 4.4 mg via INTRAVENOUS
  Administered 2022-06-27 – 2022-06-28 (×2): 1.8 mg via INTRAVENOUS
  Administered 2022-06-28 (×2): 0.9 mg via INTRAVENOUS
  Administered 2022-06-28: 0.3 mg via INTRAVENOUS
  Administered 2022-06-28: 0.6 mg via INTRAVENOUS
  Administered 2022-06-28: 1.8 mg via INTRAVENOUS
  Administered 2022-06-29: 0.6 mg via INTRAVENOUS
  Administered 2022-06-29: 2.1 mg via INTRAVENOUS
  Administered 2022-06-29: 0.3 mg via INTRAVENOUS
  Administered 2022-06-29 (×2): 0.6 mg via INTRAVENOUS
  Administered 2022-06-30: 0.3 mg via INTRAVENOUS
  Administered 2022-06-30: 2.1 mg via INTRAVENOUS
  Administered 2022-06-30: 0.5 mg via INTRAVENOUS
  Administered 2022-06-30: 1.2 mg via INTRAVENOUS
  Administered 2022-06-30: 2.4 mg via INTRAVENOUS
  Administered 2022-06-30: 0.3 mg via INTRAVENOUS
  Administered 2022-07-01: 0.9 mg via INTRAVENOUS
  Administered 2022-07-01: 1.2 mg via INTRAVENOUS
  Administered 2022-07-01: 1.5 mg via INTRAVENOUS
  Administered 2022-07-01: 2.4 mg via INTRAVENOUS
  Administered 2022-07-01: 2.7 mg via INTRAVENOUS
  Administered 2022-07-02: 3.3 mg via INTRAVENOUS
  Administered 2022-07-02: 1.5 mg via INTRAVENOUS
  Administered 2022-07-02: 3.9 mg via INTRAVENOUS
  Administered 2022-07-02: 2.1 mg via INTRAVENOUS
  Filled 2022-06-26 (×4): qty 30

## 2022-06-26 MED ORDER — MIRTAZAPINE 15 MG PO TABS
15.0000 mg | ORAL_TABLET | Freq: Every day | ORAL | Status: DC
  Administered 2022-06-26 – 2022-07-03 (×8): 15 mg via ORAL
  Filled 2022-06-26 (×8): qty 1

## 2022-06-26 MED ORDER — OXYCODONE-ACETAMINOPHEN 5-325 MG PO TABS: 1.0000 | ORAL_TABLET | Freq: Once | ORAL | Status: DC

## 2022-06-26 MED ORDER — BUSPIRONE HCL 5 MG PO TABS
20.0000 mg | ORAL_TABLET | Freq: Every day | ORAL | Status: DC
  Administered 2022-06-26 – 2022-07-03 (×8): 20 mg via ORAL
  Filled 2022-06-26 (×9): qty 4

## 2022-06-26 MED ORDER — MORPHINE SULFATE (PF) 4 MG/ML IV SOLN
8.0000 mg | INTRAVENOUS | Status: AC
  Administered 2022-06-26: 8 mg via INTRAVENOUS
  Filled 2022-06-26: qty 2

## 2022-06-26 MED ORDER — KETOROLAC TROMETHAMINE 15 MG/ML IJ SOLN
15.0000 mg | Freq: Once | INTRAMUSCULAR | Status: AC
  Administered 2022-06-26: 15 mg via INTRAVENOUS
  Filled 2022-06-26: qty 1

## 2022-06-26 MED ORDER — VITAMIN D 25 MCG (1000 UNIT) PO TABS
3000.0000 [IU] | ORAL_TABLET | Freq: Every day | ORAL | Status: DC
  Administered 2022-06-26 – 2022-07-04 (×9): 3000 [IU] via ORAL
  Filled 2022-06-26 (×9): qty 3

## 2022-06-26 MED ORDER — NALOXONE HCL 0.4 MG/ML IJ SOLN: 0.4000 mg | INTRAMUSCULAR | Status: DC | PRN

## 2022-06-26 MED ORDER — HYDROXYUREA 500 MG PO CAPS: 500.0000 mg | ORAL_CAPSULE | Freq: Every day | ORAL | Status: DC

## 2022-06-26 MED ORDER — MORPHINE SULFATE (PF) 4 MG/ML IV SOLN
6.0000 mg | INTRAVENOUS | Status: AC
  Administered 2022-06-26: 6 mg via INTRAVENOUS
  Filled 2022-06-26: qty 2

## 2022-06-26 MED ORDER — KETOROLAC TROMETHAMINE 15 MG/ML IJ SOLN
15.0000 mg | Freq: Four times a day (QID) | INTRAMUSCULAR | Status: AC
  Administered 2022-06-26 – 2022-07-01 (×20): 15 mg via INTRAVENOUS
  Filled 2022-06-26 (×19): qty 1

## 2022-06-26 MED ORDER — ONDANSETRON HCL 4 MG/2ML IJ SOLN: 4.0000 mg | Freq: Four times a day (QID) | INTRAMUSCULAR | Status: DC | PRN

## 2022-06-26 MED ORDER — FOLIC ACID 1 MG PO TABS
1.0000 mg | ORAL_TABLET | Freq: Every morning | ORAL | Status: DC
  Administered 2022-06-27 – 2022-07-04 (×8): 1 mg via ORAL
  Filled 2022-06-26 (×8): qty 1

## 2022-06-26 MED ORDER — HYDROXYUREA 500 MG PO CAPS
500.0000 mg | ORAL_CAPSULE | Freq: Two times a day (BID) | ORAL | Status: DC
  Administered 2022-06-26 – 2022-07-04 (×16): 500 mg via ORAL
  Filled 2022-06-26 (×16): qty 1

## 2022-06-26 MED ORDER — DIPHENHYDRAMINE HCL 25 MG PO CAPS
25.0000 mg | ORAL_CAPSULE | ORAL | Status: DC | PRN
  Administered 2022-06-27 – 2022-07-01 (×8): 25 mg via ORAL
  Filled 2022-06-26 (×8): qty 1

## 2022-06-26 MED ORDER — SODIUM CHLORIDE 0.9% FLUSH: 9.0000 mL | INTRAVENOUS | Status: DC | PRN

## 2022-06-26 MED ORDER — SENNOSIDES-DOCUSATE SODIUM 8.6-50 MG PO TABS
1.0000 | ORAL_TABLET | Freq: Two times a day (BID) | ORAL | Status: DC
  Administered 2022-06-26 – 2022-07-04 (×16): 1 via ORAL
  Filled 2022-06-26 (×16): qty 1

## 2022-06-26 MED ORDER — ARIPIPRAZOLE 10 MG PO TABS
5.0000 mg | ORAL_TABLET | Freq: Every day | ORAL | Status: DC
  Administered 2022-06-26 – 2022-07-03 (×8): 5 mg via ORAL
  Filled 2022-06-26 (×8): qty 1

## 2022-06-26 MED ORDER — ENOXAPARIN SODIUM 40 MG/0.4ML IJ SOSY
40.0000 mg | PREFILLED_SYRINGE | INTRAMUSCULAR | Status: DC
  Administered 2022-06-26 – 2022-07-03 (×8): 40 mg via SUBCUTANEOUS
  Filled 2022-06-26 (×8): qty 0.4

## 2022-06-26 MED ORDER — KETOROLAC TROMETHAMINE 15 MG/ML IJ SOLN: 15.0000 mg | INTRAMUSCULAR | Status: DC

## 2022-06-26 MED ORDER — POLYETHYLENE GLYCOL 3350 17 G PO PACK
17.0000 g | PACK | Freq: Every day | ORAL | Status: DC | PRN
  Administered 2022-06-30: 17 g via ORAL
  Filled 2022-06-26: qty 1

## 2022-06-26 MED ORDER — INFLUENZA VAC SPLIT QUAD 0.5 ML IM SUSY
0.5000 mL | PREFILLED_SYRINGE | INTRAMUSCULAR | Status: AC
  Administered 2022-06-27: 0.5 mL via INTRAMUSCULAR
  Filled 2022-06-26: qty 0.5

## 2022-06-26 NOTE — ED Notes (Signed)
ED TO INPATIENT HANDOFF REPORT  ED Nurse Name and Phone #: Tynisa Vohs RN    S Name/Age/Gender Ellison Hughs 26 y.o. male Room/Bed: WA13/WA13  Code Status   Code Status: Prior  Home/SNF/Other JAIL Patient oriented to: self, place, time, and situation Is this baseline? Yes   Triage Complete: Triage complete  Chief Complaint Sickle cell pain crisis ([place]) [D57.00]  Triage Note [facility] with [facility]. Leg pain 10/10. Pt fell in shower this morning. Collar in place upon arrival.   Allergies No Known Allergies  Level of Care/Admitting Diagnosis ED Disposition     ED Disposition  Admit   Condition  --   Comment  Hospital Area: [facility] [[id number]]  Level of Care: Med-Surg [16]  May admit patient to [facility] or [facility] if equivalent level of care is available:: No  Covid Evaluation: Asymptomatic - no recent exposure (last 10 days) testing not required  Diagnosis: Sickle cell pain crisis [facility]) [[id number]]  Admitting Physician: Dorena Dew [[id number]]  Attending Physician: [name] [[id number]]  Certification:: I certify this patient will need inpatient services for at least 2 midnights  Estimated Length of Stay: 2          B Medical/Surgery History Past Medical History:  Diagnosis Date   Chronic lower back pain    Depression    Sickle cell crisis ([place])    Sickle cell disease ([place])    Past Surgical History:  Procedure Laterality Date   SPLENECTOMY       A IV Location/Drains/Wounds Patient Lines/Drains/Airways Status     Active Line/Drains/Airways     Name Placement date Placement time Site Days   Peripheral IV 06/26/22 20 G Anterior;Left;Proximal Forearm 06/26/22  1329  Forearm  less than 1            Intake/Output Last 24 hours  Intake/Output Summary (Last 24 hours) at 06/26/2022 1804 Last data filed at 06/26/2022 1513 Gross per 24 hour  Intake 999 ml  Output --  Net 999 ml    Labs/Imaging Results  for orders placed or performed during the hospital encounter of 06/26/22 (from the past 48 hour(s))  CBC WITH DIFFERENTIAL     Status: Abnormal   Collection Time: 06/26/22  1:06 PM  Result Value Ref Range   WBC 22.5 (H) 4.0 - 10.5 K/uL   RBC 2.65 (L) 4.22 - 5.81 MIL/uL   Hemoglobin 8.9 (L) 13.0 - 17.0 g/dL   HCT 25.7 (L) 39.0 - 52.0 %   MCV 97.0 80.0 - 100.0 fL   MCH 33.6 26.0 - 34.0 pg   MCHC 34.6 30.0 - 36.0 g/dL   RDW 20.4 (H) 11.5 - 15.5 %   Platelets 447 (H) 150 - 400 K/uL   nRBC 5.8 (H) 0.0 - 0.2 %   Neutrophils Relative % 86 %   Neutro Abs 19.3 (H) 1.7 - 7.7 K/uL   Lymphocytes Relative 4 %   Lymphs Abs 0.9 0.7 - 4.0 K/uL   Monocytes Relative 9 %   Monocytes Absolute 2.0 (H) 0.1 - 1.0 K/uL   Eosinophils Relative 0 %   Eosinophils Absolute 0.0 0.0 - 0.5 K/uL   Basophils Relative 0 %   Basophils Absolute 0.1 0.0 - 0.1 K/uL   Immature Granulocytes 1 %   Abs Immature Granulocytes 0.26 (H) 0.00 - 0.07 K/uL   Polychromasia PRESENT    Sickle Cells PRESENT    Target Cells PRESENT  Comment: Performed at [facility], [place] [address] [place], [place] [address]  Reticulocytes     Status: Abnormal   Collection Time: 06/26/22  1:06 PM  Result Value Ref Range   Retic Ct Pct 11.2 (H) 0.4 - 3.1 %   RBC. 2.70 (L) 4.22 - 5.81 MIL/uL   Retic Count, Absolute 290.5 (H) 19.0 - 186.0 K/uL   Immature Retic Fract 31.0 (H) 2.3 - 15.9 %    Comment: Performed at [facility], [place] [address] [place], [place] [address]  Basic metabolic panel     Status: Abnormal   Collection Time: 06/26/22  1:06 PM  Result Value Ref Range   Sodium 136 135 - 145 mmol/L   Potassium 4.0 3.5 - 5.1 mmol/L   Chloride 105 98 - 111 mmol/L   CO2 25 22 - 32 mmol/L   Glucose, Bld 125 (H) 70 - 99 mg/dL    Comment: Glucose reference range applies only to samples taken after fasting for at least 8 hours.   BUN 14 6 - 20 mg/dL   Creatinine, Ser 0.89 0.61 - 1.24 mg/dL   Calcium 9.1  8.9 - 10.3 mg/dL   GFR, Estimated >60 >60 mL/min    Comment: (NOTE) Calculated using the CKD-EPI Creatinine Equation (2021)    Anion gap 6 5 - 15    Comment: Performed at [facility], [place] [address] [place], [place] [address]  Hepatic function panel     Status: Abnormal   Collection Time: 06/26/22  1:18 PM  Result Value Ref Range   Total Protein 8.2 (H) 6.5 - 8.1 g/dL   Albumin 4.1 3.5 - 5.0 g/dL   AST 67 (H) 15 - 41 U/L   ALT 97 (H) 0 - 44 U/L   Alkaline Phosphatase 111 38 - 126 U/L   Total Bilirubin 2.0 (H) 0.3 - 1.2 mg/dL   Bilirubin, Direct 0.5 (H) 0.0 - 0.2 mg/dL   Indirect Bilirubin 1.5 (H) 0.3 - 0.9 mg/dL    Comment: Performed at [facility], [place] [address], [place], [place] [address]   No results found.  Pending Labs [employer] (From admission, onward)     Start     Ordered   Signed and Held  HIV Antibody (routine testing w rflx)  (HIV Antibody (Routine testing w reflex) panel)  Once,   R        Signed and Held   Signed and Held  CBC  Daily at [employer],   R      Signed and Held            Vitals/Pain Today's Vitals   06/26/22 1730 06/26/22 1745 06/26/22 1800 06/26/22 1801  BP: 111/61     Pulse: 96 88 (!) 103   Resp: 19 (!) 24 18   Temp:      TempSrc:      SpO2: 99% 98% 97%   PainSc:    8     Isolation Precautions No active isolations  Medications Medications  oxyCODONE-acetaminophen (PERCOCET/ROXICET) 5-325 MG per tablet 1 tablet (1 tablet Oral Not Given 06/26/22 1307)  influenza vac split quadrivalent PF (FLUARIX) injection 0.5 mL (has no administration in time range)  morphine (PF) 4 MG/ML injection 6 mg (6 mg Intravenous Given 06/26/22 1312)  morphine (PF) 4 MG/ML injection 8 mg (8 mg Intravenous Given 06/26/22 1407)  ketorolac (TORADOL) 15 MG/ML injection 15 mg (15 mg Intravenous Given 06/26/22 1407)  lactated  ringers bolus 1,000 mL (0 mLs Intravenous Stopped 06/26/22 1513)    Mobility walks High fall  risk   Focused Assessments    R Recommendations: See Admitting Provider Note  Report given to:   Additional Notes:

## 2022-06-26 NOTE — ED Triage Notes (Addendum)
[facility] with [facility]. Leg pain 10/10. Pt fell in shower this morning. Collar in place upon arrival.

## 2022-06-26 NOTE — ED Notes (Signed)
ED Provider at bedside. 

## 2022-06-26 NOTE — ED Provider Notes (Signed)
[facility] DEPT Provider Note   CSN: [id number] Arrival date & time: 06/26/22  1157     History  Chief Complaint  Patient presents with   Sickle Cell Pain Crisis    Jeffrey Morrison is a 26 y.o. male.  Patient is a 26 year old male with a past medical history of sickle cell disease and currently in police custody presenting to the emergency department with sickle cell pain.  The patient states that over the last 2 days Jeffrey Morrison has had increasing pain in his bilateral lower extremities that feels similar to his sickle cell pain.  Jeffrey Morrison states that Jeffrey Morrison has been receiving Motrin at jail without significant relief.  Jeffrey Morrison states that Jeffrey Morrison did have a fall in the shower today where Jeffrey Morrison slipped and fell.  Jeffrey Morrison states Jeffrey Morrison is unsure if Jeffrey Morrison hit his head but had no loss of consciousness.  Jeffrey Morrison denied any new or increasing pain after the fall.  Jeffrey Morrison denies any chest pain or shortness of breath.  The history is provided by the patient.  Sickle Cell Pain Crisis      Home Medications Prior to Admission medications   Medication Sig Start Date End Date Taking? Authorizing Provider  ARIPiprazole (ABILIFY) 5 MG tablet Take 5 mg by mouth at bedtime.   Yes [provider]  busPIRone (BUSPAR) 10 MG tablet Take 20 mg by mouth at bedtime.   Yes [provider]  cholecalciferol (VITAMIN D3) 25 MCG (1000 UNIT) tablet Take 3,000 Units by mouth daily.   Yes [provider]  docusate sodium (COLACE) 100 MG capsule Take 100 mg by mouth 2 (two) times daily.   Yes [provider]  folic acid (FOLVITE) 1 MG tablet Take 1 mg by mouth in the morning.   Yes [provider]  hydroxyurea (HYDREA) 500 MG capsule Take 1 capsule (500 mg total) by mouth daily. May take with food to minimize GI side effects. Patient taking differently: Take 500 mg by mouth 2 (two) times daily. 02/20/21  Yes Dorena Dew, FNP  mirtazapine (REMERON) 15 MG tablet Take 15 mg by mouth at  bedtime.   Yes [provider]  polyethylene glycol powder (GLYCOLAX/MIRALAX) 17 GM/SCOOP powder Take 17 g by mouth daily as needed (constipation).   Yes [provider]  pseudoephedrine (SUDAFED) 30 MG tablet Take 60 mg by mouth at bedtime.   Yes [provider]      Allergies    Patient has no known allergies.    Review of Systems   Review of Systems  Physical Exam Updated Vital Signs BP (!) 158/62   Pulse 86   Temp 98.6 F (37 C) (Oral)   Resp (!) 24   SpO2 100%  Physical Exam Vitals and nursing note reviewed.  Constitutional:      General: Jeffrey Morrison is not in acute distress.    Appearance: Normal appearance.  HENT:     Head: Normocephalic and atraumatic.     Nose: Nose normal.     Mouth/Throat:     Mouth: Mucous membranes are moist.     Pharynx: Oropharynx is clear.  Eyes:     Extraocular Movements: Extraocular movements intact.     Conjunctiva/sclera: Conjunctivae normal.     Pupils: Pupils are equal, round, and reactive to light.  Neck:     Comments: No midline neck tenderness Full neck ROM bilaterally No pain with axial load Cardiovascular:     Rate and Rhythm: Normal rate and  regular rhythm.     Pulses: Normal pulses.     Heart sounds: Normal heart sounds.  Pulmonary:     Effort: Pulmonary effort is normal.     Breath sounds: Normal breath sounds.  Abdominal:     General: Abdomen is flat.     Palpations: Abdomen is soft.     Tenderness: There is no abdominal tenderness.  Musculoskeletal:        General: No swelling or deformity. Normal range of motion.     Cervical back: Normal range of motion and neck supple.     Comments: Diffuse tenderness to palpation of bilateral lower extremities  Skin:    General: Skin is warm and dry.     Findings: No bruising or rash.  Neurological:     General: No focal deficit present.     Mental Status: Jeffrey Morrison is alert and oriented to person, place, and time.     Sensory: No sensory deficit.     Motor: No  weakness.  Psychiatric:        Mood and Affect: Mood normal.        Behavior: Behavior normal.     ED Results / Procedures / Treatments   Labs (all labs ordered are listed, but only abnormal results are displayed) Labs Reviewed  CBC WITH DIFFERENTIAL/PLATELET - Abnormal; Notable for the following components:      Result Value   WBC 22.5 (*)    RBC 2.65 (*)    Hemoglobin 8.9 (*)    HCT 25.7 (*)    RDW 20.4 (*)    Platelets 447 (*)    nRBC 5.8 (*)    Neutro Abs 19.3 (*)    Monocytes Absolute 2.0 (*)    Abs Immature Granulocytes 0.26 (*)    All other components within normal limits  RETICULOCYTES - Abnormal; Notable for the following components:   Retic Ct Pct 11.2 (*)    RBC. 2.70 (*)    Retic Count, Absolute 290.5 (*)    Immature Retic Fract 31.0 (*)    All other components within normal limits  BASIC METABOLIC PANEL - Abnormal; Notable for the following components:   Glucose, Bld 125 (*)    All other components within normal limits    EKG None  Radiology No results found.  Procedures Procedures    Medications Ordered in ED Medications  oxyCODONE-acetaminophen (PERCOCET/ROXICET) 5-325 MG per tablet 1 tablet (1 tablet Oral Not Given 06/26/22 1307)  morphine (PF) 4 MG/ML injection 6 mg (6 mg Intravenous Given 06/26/22 1312)  morphine (PF) 4 MG/ML injection 8 mg (8 mg Intravenous Given 06/26/22 1407)  ketorolac (TORADOL) 15 MG/ML injection 15 mg (15 mg Intravenous Given 06/26/22 1407)  lactated ringers bolus 1,000 mL (0 mLs Intravenous Stopped 06/26/22 1513)    ED Course/ Medical Decision Making/ A&P Clinical Course as of 06/26/22 1559  Thu Jun 26, 2022  1404 Hgb improved from baseline, appropriate reticulocyte count. [VK]  5573 Patient reported improvement of his pain but does not yet feel comfortable with discharge. Jeffrey Morrison will be given additional dose of morphine and Toradol. Jeffrey Morrison will likely be discharged after second treatment. [VK]  1522 Patient reports some  improvement of his pain but states that Jeffrey Morrison does not feel comfortable with being discharged back to jail.  I will reach out to sickle cell team for further recommendations for disposition. [VK]  2202 I spoke with Lachina NP of sickle cell clinic who recommended admission to obs for PCA pain  control. [VK]    Clinical Course User Index [VK] Kemper Durie, DO                           Medical Decision Making This patient presents to the ED with chief complaint(s) of bilateral lower extremity pain with pertinent past medical history of sickle cell disease which further complicates the presenting complaint. The complaint involves an extensive differential diagnosis and also carries with it a high risk of complications and morbidity.    The differential diagnosis includes cell pain crisis, patient has no point bony tenderness and no obvious deformities making fracture dislocation unlikely, Jeffrey Morrison is low risk by [place] head CT and C-spine rules making ICH or cervical spine fracture unlikely.  C-collar was cleared by myself.  Patient has no other signs of traumatic injury on exam.  Jeffrey Morrison has no chest pain, shortness of breath or cough making acute chest unlikely  Additional history obtained: Additional history obtained from N/A Records reviewed previous admission documents and Care Everywhere/External Records  ED Course and Reassessment: Patient has no signs of acute traumatic injury and is low risk by [place] head CT and C-spine rule.  His c-collar was cleared by myself.  Jeffrey Morrison will have work-up performed to evaluate for aplastic crisis, anemia and this causes of his worsening pain and will be started on pain control with morphine and Toradol.  Independent labs interpretation:  The following labs were independently interpreted: Hemoglobin improved from baseline, appropriately elevated reticulocyte count  Independent visualization of imaging: N/A  Consultation: - Consulted or discussed  management/test interpretation w/ external professional: Sickle cell team/hematology  Consideration for admission or further workup: Patient will require admission for further pain control. Social Determinants of health: incarcerated    Amount and/or Complexity of Data Reviewed Labs: ordered.  Risk Prescription drug management. Decision regarding hospitalization.          Final Clinical Impression(s) / ED Diagnoses Final diagnoses:  Sickle cell pain crisis [facility])    Rx / DC Orders ED Discharge Orders     None         Kemper Durie, DO 06/26/22 1559

## 2022-06-26 NOTE — H&P (Signed)
H&P  Patient Demographics:  Jeffrey Morrison, is a 26 y.o. male  MRN: [id number]   DOB - [birth year]  Admit Date - 06/26/2022  Outpatient Primary MD for the patient is Dorena Dew, FNP  Chief Complaint  Patient presents with   Sickle Cell Pain Crisis      HPI:   Jeffrey Morrison  is a 26 y.o. male with a medical history significant for sickle cell disease, history of anemia of chronic disease that presents to the emergency department with complaints of bilateral knee pain over the past 24 hours.  Patient is currently incarcerated and is accompanied by a Engineer, structural. Patient states that he was in his usual state of health until 24 hours prior when pain intensity increased and bilateral knees.  Patient has not identified any inciting factors concerning crisis.  He currently rates his pain as 10/10 despite IV morphine, IV fluids, and IV Toradol.  Patient states that prior to presenting to the emergency department he was administered ibuprofen, which was ineffective.  Patient currently denies any headache, chest pain, shortness of breath, dizziness, fever, or chills.  No nausea, vomiting, or diarrhea.  No sick contacts or known exposure to COVID-19.  ED course: While in the ER, patient's vital signs remained stable.BP 120/64 (BP Location: Right Arm)   Pulse 85   Temp 99.6 F (37.6 C) (Oral)   Resp 17   Ht '5\' 9"'$  (1.753 m)   Wt 99.7 kg   SpO2 93%   BMI 32.46 kg/m  Reviewed all laboratory values: CBC shows WBCs 22,500, hemoglobin 8.9 g/dL, and platelets 447,000.  Reticulocytes 11.2%.  Basic metabolic panel unremarkable.  Hepatic function panel shows AST 67, ALT 97, and total bilirubin 2.0.  Patient's pain is suboptimal despite IV morphine, IV fluids, and IV Toradol.  Patient will be admitted for sickle cell pain crisis   Review of systems:  In addition to the HPI above, patient reports No fever or chills No Headache, No changes with vision or hearing No problems swallowing food or  liquids No chest pain, cough or shortness of breath No abdominal pain, No nausea or vomiting, Bowel movements are regular No blood in stool or urine No dysuria No new skin rashes or bruises No new joints pains-aches No new weakness, tingling, numbness in any extremity No recent weight gain or loss No polyuria, polydypsia or polyphagia No significant Mental Stressors  With Past History of the following :   Past Medical History:  Diagnosis Date   Chronic lower back pain    Depression    Sickle cell crisis (HCC)    Sickle cell disease ([place])       Past Surgical History:  Procedure Laterality Date   SPLENECTOMY       Social History:   Social History   Tobacco Use   Smoking status: Former    Packs/day: 0.50    Types: Cigarettes   Smokeless tobacco: Never  Substance Use Topics   Alcohol use: No     Lives - At home   Family History :   Family History  Problem Relation Age of Onset   Diabetes Mother      Home Medications:   Prior to Admission medications   Medication Sig Start Date End Date Taking? Authorizing Provider  ARIPiprazole (ABILIFY) 5 MG tablet Take 5 mg by mouth at bedtime.   Yes [provider]  busPIRone (BUSPAR) 10 MG tablet Take 20 mg by mouth at bedtime.   Yes [provider]  cholecalciferol (VITAMIN D3) 25 MCG (1000 UNIT) tablet Take 3,000 Units by mouth daily.   Yes [provider]  docusate sodium (COLACE) 100 MG capsule Take 100 mg by mouth 2 (two) times daily.   Yes [provider]  folic acid (FOLVITE) 1 MG tablet Take 1 mg by mouth in the morning.   Yes [provider]  hydroxyurea (HYDREA) 500 MG capsule Take 1 capsule (500 mg total) by mouth daily. May take with food to minimize GI side effects. Patient taking differently: Take 500 mg by mouth 2 (two) times daily. 02/20/21  Yes Dorena Dew, FNP  mirtazapine (REMERON) 15 MG tablet Take 15 mg by mouth at bedtime.   Yes [provider]   polyethylene glycol powder (GLYCOLAX/MIRALAX) 17 GM/SCOOP powder Take 17 g by mouth daily as needed (constipation).   Yes [provider]  pseudoephedrine (SUDAFED) 30 MG tablet Take 60 mg by mouth at bedtime.   Yes [provider]     Allergies:   No Known Allergies   Physical Exam:   Vitals:   Vitals:   06/26/22 1848 06/26/22 2157  BP:  120/64  Pulse:  85  Resp: 18 17  Temp:  99.6 F (37.6 C)  SpO2: 97% 93%    Physical Exam: Constitutional: Patient appears well-developed and well-nourished. Not in obvious distress. HENT: Normocephalic, atraumatic, External right and left ear normal. Oropharynx is clear and moist.  Eyes: Conjunctivae and EOM are normal. PERRLA, no scleral icterus. Neck: Normal ROM. Neck supple. No JVD. No tracheal deviation. No thyromegaly. CVS: RRR, S1/S2 +, no murmurs, no gallops, no carotid bruit.  Pulmonary: Effort and breath sounds normal, no stridor, rhonchi, wheezes, rales.  Abdominal: Soft. BS +, no distension, tenderness, rebound or guarding.  Musculoskeletal: Normal range of motion. No edema and no tenderness.  Lymphadenopathy: No lymphadenopathy noted, cervical, inguinal or axillary Neuro: Alert. Normal reflexes, muscle tone coordination. No cranial nerve deficit. Skin: Skin is warm and dry. No rash noted. Not diaphoretic. No erythema. No pallor. Psychiatric: Normal mood and affect. Behavior, judgment, thought content normal.   Data Review:   CBC Recent Labs  Lab 06/26/22 1306  WBC 22.5*  HGB 8.9*  HCT 25.7*  PLT 447*  MCV 97.0  MCH 33.6  MCHC 34.6  RDW 20.4*  LYMPHSABS 0.9  MONOABS 2.0*  EOSABS 0.0  BASOSABS 0.1   ------------------------------------------------------------------------------------------------------------------  Chemistries  Recent Labs  Lab 06/26/22 1306 06/26/22 1318  NA 136  --   K 4.0  --   CL 105  --   CO2 25  --   GLUCOSE 125*  --   BUN 14  --   CREATININE 0.89  --   CALCIUM  9.1  --   AST  --  67*  ALT  --  97*  ALKPHOS  --  111  BILITOT  --  2.0*   ------------------------------------------------------------------------------------------------------------------ estimated creatinine clearance is 143.8 mL/min (by C-G formula based on SCr of 0.89 mg/dL). ------------------------------------------------------------------------------------------------------------------ No results for input(s): "TSH", "T4TOTAL", "T3FREE", "THYROIDAB" in the last 72 hours.  Invalid input(s): "FREET3"  Coagulation profile No results for input(s): "INR", "PROTIME" in the last 168 hours. ------------------------------------------------------------------------------------------------------------------- No results for input(s): "DDIMER" in the last 72 hours. -------------------------------------------------------------------------------------------------------------------  Cardiac Enzymes No results for input(s): "CKMB", "TROPONINI", "MYOGLOBIN" in the last 168 hours.  Invalid input(s): "CK" ------------------------------------------------------------------------------------------------------------------ No results found for: "BNP"  ---------------------------------------------------------------------------------------------------------------  Urinalysis    Component Value Date/Time   COLORURINE YELLOW 04/28/2021  [place] 04/28/2021 1511   LABSPEC 1.014 04/28/2021 1511   PHURINE 5.0 04/28/2021 1511   GLUCOSEU NEGATIVE 04/28/2021 1511   [place] 04/28/2021 [place] 04/28/2021 1511   [employer] 04/28/2021 1511   PROTEINUR 100 (A) 04/28/2021 1511   UROBILINOGEN 1.0 10/02/2017 1116   NITRITE NEGATIVE 04/28/2021 1511   LEUKOCYTESUR TRACE (A) 04/28/2021 1511    ----------------------------------------------------------------------------------------------------------------   Imaging Results:    No results found.    Assessment & Plan:  Principal Problem:   Sickle cell pain crisis (HCC) Active Problems:   Anemia of chronic disease   Transaminitis   Thrombocytosis  Sickle cell disease with pain crisis:  Admit patient, start IVF 0.45% Saline @ 134ms/hour, start weight based Dilaudid PCA, start IV Toradol 15 mg Q 6 H, Restart oral home pain medications, Monitor vitals very closely, Re-evaluate pain scale regularly, 2 L of Oxygen by [place], Patient will be re-evaluated for pain in the context of function and relationship to baseline as care progresses.  Anemia of chronic disease: Hemoglobin is stable and appears to be consistent with his baseline.  There is no clinical indication for blood transfusion at this time.  Leukocytosis: WBCs 22,500.  More than likely reactive and secondary to sickle cell disease.  Patient afebrile.  No signs of active infection.  Review urinalysis as results become available.  We will monitor closely without antibiotics.  Labs in AM.  Thrombocytosis: Platelets elevated.  Secondary to sickle cell crisis.  Continue to monitor closely.  Transaminitis: ALT and AST elevated.  Secondary to sickle cell crisis.  Refrain from hepatotoxins.  We will monitor closely.  DVT Prophylaxis: Subcut Lovenox and SCDs  AM Labs Ordered, also please review Full Orders  Family Communication: Admission, patient's condition and plan of care including tests being ordered have been discussed with the patient who indicate understanding and agree with the plan and Code Status.  Code Status: Full Code  Consults called: None    Admission status: Inpatient    Time spent in minutes : 30 minutes  [place] MSN, FNP-C [facility] [address] [place] [phone] 06/27/2022 at 12:13 AM

## 2022-06-26 NOTE — ED Notes (Signed)
Patient transported to X-ray 

## 2022-06-27 DIAGNOSIS — D5700 Hb-SS disease with crisis, unspecified: Principal | ICD-10-CM

## 2022-06-27 DIAGNOSIS — D75839 Thrombocytosis, unspecified: Secondary | ICD-10-CM | POA: Diagnosis present

## 2022-06-27 LAB — CBC
HCT: 24.1 % — ABNORMAL LOW (ref 39.0–52.0)
Hemoglobin: 8.2 g/dL — ABNORMAL LOW (ref 13.0–17.0)
MCH: 33.3 pg (ref 26.0–34.0)
MCHC: 34 g/dL (ref 30.0–36.0)
MCV: 98 fL (ref 80.0–100.0)
Platelets: 394 10*3/uL (ref 150–400)
RBC: 2.46 MIL/uL — ABNORMAL LOW (ref 4.22–5.81)
RDW: 19.9 % — ABNORMAL HIGH (ref 11.5–15.5)
WBC: 18.9 10*3/uL — ABNORMAL HIGH (ref 4.0–10.5)
nRBC: 7.3 % — ABNORMAL HIGH (ref 0.0–0.2)

## 2022-06-27 LAB — HIV ANTIBODY (ROUTINE TESTING W REFLEX): HIV Screen 4th Generation wRfx: NONREACTIVE

## 2022-06-27 MED ORDER — HYDROMORPHONE HCL 1 MG/ML IJ SOLN
1.0000 mg | Freq: Once | INTRAMUSCULAR | Status: AC
  Administered 2022-06-27: 1 mg via INTRAVENOUS
  Filled 2022-06-27: qty 1

## 2022-06-27 MED ORDER — OXYCODONE HCL 5 MG PO TABS
10.0000 mg | ORAL_TABLET | ORAL | Status: DC | PRN
  Administered 2022-06-27 – 2022-07-02 (×19): 10 mg via ORAL
  Filled 2022-06-27 (×19): qty 2

## 2022-06-27 MED ORDER — DOXYCYCLINE HYCLATE 100 MG PO TABS
100.0000 mg | ORAL_TABLET | Freq: Two times a day (BID) | ORAL | Status: AC
  Administered 2022-06-27 – 2022-07-03 (×14): 100 mg via ORAL
  Filled 2022-06-27 (×14): qty 1

## 2022-06-27 MED ORDER — CAMPHOR-MENTHOL 0.5-0.5 % EX LOTN
TOPICAL_LOTION | CUTANEOUS | Status: DC | PRN
  Filled 2022-06-27 (×3): qty 222

## 2022-06-27 NOTE — Progress Notes (Signed)
Subjective: Jeffrey Morrison is a 26 year old male with a medical history significant for sickle cell disease was admitted for sickle cell pain crisis.  Patient is currently incarcerated and is accompanied by guard.  Today, patient has a complaint of bilateral lower extremity swelling.  He states that he has had wounds that he has been "scratching" over the past week.  He is also complaining of bilateral knee and right hip pain that is worsening.  Pain persists despite IV Dilaudid PCA. He denies any headache, chest pain, shortness of breath, urinary symptoms, nausea, vomiting, or diarrhea.  Patient has been afebrile overnight.  Objective:  Vital signs in last 24 hours:  Vitals:   06/27/22 0728 06/27/22 1046 06/27/22 1206 06/27/22 1351  BP:  (!) 144/75  118/83  Pulse:  77  84  Resp: '16 19 16 18  '$ Temp:  97.8 F (36.6 C)  98.8 F (37.1 C)  TempSrc:  Oral  Oral  SpO2: 93% 96% 96% 94%  Weight:      Height:        Intake/Output from previous day:   Intake/Output Summary (Last 24 hours) at 06/27/2022 1547 Last data filed at 06/27/2022 1111 Gross per 24 hour  Intake 1310 ml  Output 700 ml  Net 610 ml    Physical Exam: General: Alert, awake, oriented x3, in no acute distress.  HEENT: [place]/AT PEERL, EOMI Neck: Trachea midline,  no masses, no thyromegal,y no JVD, no carotid bruit OROPHARYNX:  Moist, No exudate/ erythema/lesions.  Heart: Regular rate and rhythm, without murmurs, rubs, gallops, PMI non-displaced, no heaves or thrills on palpation.  Lungs: Clear to auscultation, no wheezing or rhonchi noted. No increased vocal fremitus resonant to percussion  Abdomen: Soft, nontender, nondistended, positive bowel sounds, no masses no hepatosplenomegaly noted..  Neuro: No focal neurological deficits noted cranial nerves II through XII grossly intact. DTRs 2+ bilaterally upper and lower extremities. Strength 5 out of 5 in bilateral upper and lower extremities. Musculoskeletal: No warm swelling  or erythema around joints, no spinal tenderness noted. Psychiatric: Patient alert and oriented x3, good insight and cognition, good recent to remote recall. Lymph node survey: No cervical axillary or inguinal lymphadenopathy noted. Skin: Bilateral lower extremities dry, excoriated.  90% granulated wound to left lateral shin.  Mild edema to bilateral lower extremities.  No erythema noted. Lab Results:  Basic Metabolic Panel:    Component Value Date/Time   NA 136 06/26/2022 1306   NA 140 10/02/2017 1126   K 4.0 06/26/2022 1306   CL 105 06/26/2022 1306   CO2 25 06/26/2022 1306   BUN 14 06/26/2022 1306   BUN 9 10/02/2017 1126   CREATININE 0.89 06/26/2022 1306   GLUCOSE 125 (H) 06/26/2022 1306   CALCIUM 9.1 06/26/2022 1306   CBC:    Component Value Date/Time   WBC 18.9 (H) 06/27/2022 0525   HGB 8.2 (L) 06/27/2022 0525   HGB 7.5 (L) 10/02/2017 1126   HCT 24.1 (L) 06/27/2022 0525   HCT 22.0 (L) 10/02/2017 1126   PLT 394 06/27/2022 0525   PLT 511 (H) 10/02/2017 1126   MCV 98.0 06/27/2022 0525   MCV 94 10/02/2017 1126   NEUTROABS 19.3 (H) 06/26/2022 1306   NEUTROABS 8.8 (H) 10/02/2017 1126   LYMPHSABS 0.9 06/26/2022 1306   LYMPHSABS 5.2 (H) 10/02/2017 1126   MONOABS 2.0 (H) 06/26/2022 1306   EOSABS 0.0 06/26/2022 1306   EOSABS 0.2 10/02/2017 1126   BASOSABS 0.1 06/26/2022 1306   BASOSABS 0.1 10/02/2017 1126  No results found for this or any previous visit (from the past 240 hour(s)).  Studies/Results: No results found.  Medications: Scheduled Meds:  ARIPiprazole  5 mg Oral QHS   busPIRone  20 mg Oral QHS   cholecalciferol  3,000 Units Oral Daily   doxycycline  100 mg Oral Q12H   enoxaparin (LOVENOX) injection  40 mg Subcutaneous [id number]   folic acid  1 mg Oral q AM   HYDROmorphone   Intravenous Q4H   hydroxyurea  500 mg Oral BID   ketorolac  15 mg Intravenous Q6H   mirtazapine  15 mg Oral QHS   oxyCODONE-acetaminophen  1 tablet Oral Once   senna-docusate  1 tablet  Oral BID   Continuous Infusions: PRN Meds:.camphor-menthol, diphenhydrAMINE, naloxone **AND** sodium chloride flush, ondansetron (ZOFRAN) IV, oxyCODONE, polyethylene glycol  Consultants: none  Procedures: none  Antibiotics: none  Assessment/Plan: Principal Problem:   Sickle cell pain crisis (HCC) Active Problems:   Anemia of chronic disease   Transaminitis   Thrombocytosis  Sickle cell disease with pain crisis: Decrease IV fluids to KVO Continue IV Dilaudid PCA, no changes Toradol 15 mg IV every 6 hours Oxycodone 10 mg every 4 hours as needed for severe breakthrough pain Monitor vital signs very closely, reevaluate pain scale regularly, and supplemental oxygen as needed.  Cellulitis bilateral lower extremities: Patient has multiple granulated wounds to bilateral lower extremity.  Patient has been scratching his lower extremities, excoriation is due to constantly scratching.  Initiate doxycycline 100 mg twice daily continue Benadryl 25 mg every 4 hours as needed for itching.  Also, apply Sarna lotion to affected areas. Leukocytosis: Stable.  Oral antibiotics.  Patient afebrile.  We will continue to monitor closely.  Labs in AM.  Thrombocytosis: Stable.  Secondary to sickle cell crisis.  Continue to monitor closely.  Transaminitis: Secondary to sickle cell disease.  We will refrain from hepatotoxins.  Monitor closely.  Code Status: Full Code Family Communication: N/A Disposition Plan: Not yet ready for discharge  [place], MSN, FNP-C Patient [place] [address] [place], [place] [address] [phone]   If 7PM-7AM, please contact night-coverage.  06/27/2022, 3:47 PM  LOS: 1 day

## 2022-06-28 DIAGNOSIS — D75839 Thrombocytosis, unspecified: Secondary | ICD-10-CM | POA: Diagnosis not present

## 2022-06-28 DIAGNOSIS — R7401 Elevation of levels of liver transaminase levels: Secondary | ICD-10-CM

## 2022-06-28 DIAGNOSIS — D638 Anemia in other chronic diseases classified elsewhere: Secondary | ICD-10-CM

## 2022-06-28 DIAGNOSIS — D5700 Hb-SS disease with crisis, unspecified: Secondary | ICD-10-CM | POA: Diagnosis not present

## 2022-06-28 LAB — CBC
HCT: 26 % — ABNORMAL LOW (ref 39.0–52.0)
Hemoglobin: 8.7 g/dL — ABNORMAL LOW (ref 13.0–17.0)
MCH: 32.7 pg (ref 26.0–34.0)
MCHC: 33.5 g/dL (ref 30.0–36.0)
MCV: 97.7 fL (ref 80.0–100.0)
Platelets: 457 10*3/uL — ABNORMAL HIGH (ref 150–400)
RBC: 2.66 MIL/uL — ABNORMAL LOW (ref 4.22–5.81)
RDW: 20.4 % — ABNORMAL HIGH (ref 11.5–15.5)
WBC: 17.4 10*3/uL — ABNORMAL HIGH (ref 4.0–10.5)
nRBC: 13.4 % — ABNORMAL HIGH (ref 0.0–0.2)

## 2022-06-28 MED ORDER — ACETAMINOPHEN 325 MG PO TABS
650.0000 mg | ORAL_TABLET | Freq: Four times a day (QID) | ORAL | Status: DC | PRN
  Administered 2022-06-28 (×2): 650 mg via ORAL
  Filled 2022-06-28 (×2): qty 2

## 2022-06-28 MED ORDER — BACITRACIN ZINC 500 UNIT/GM EX OINT
TOPICAL_OINTMENT | Freq: Every day | CUTANEOUS | Status: AC
  Administered 2022-06-28 – 2022-06-29 (×2): 1 via TOPICAL
  Administered 2022-06-30 – 2022-07-04 (×5): 15.7778 via TOPICAL
  Filled 2022-06-28 (×2): qty 28.35

## 2022-06-28 MED ORDER — BACITRACIN ZINC 500 UNIT/GM EX OINT
TOPICAL_OINTMENT | Freq: Every day | CUTANEOUS | Status: DC
  Filled 2022-06-28: qty 28.35

## 2022-06-28 NOTE — Progress Notes (Signed)
Patient ID: Jeffrey Morrison, male   DOB: Feb 20, 1996, 26 y.o.   MRN: [id number] Subjective: Jeffrey Morrison is a 26 year old male with a medical history significant for sickle cell disease was admitted for sickle cell pain crisis.  Patient is currently incarcerated and is accompanied by guard.  Patient continues to complain of bilateral lower extremity pain associated with swelling.  He still has the wounds that are now draining and painful.  He is also complaining of bilateral knee and right hip pain.  These are all days while his IV Dilaudid via PCA.  He has not been attempting to ambulate in his room or hallway.  He however denies any fever now although he was said to have spiked temperature up to 101 degrees Fahrenheit yesterday.  He denies any chest pain, cough, shortness of breath, nausea, vomiting or diarrhea.  No urinary symptoms.  Objective:  Vital signs in last 24 hours:  Vitals:   06/28/22 0745 06/28/22 1011 06/28/22 1124 06/28/22 1343  BP: 129/66 130/70  113/63  Pulse: (!) 43 75  85  Resp: '15 16 15 20  '$ Temp: 99.9 F (37.7 C) 98.4 F (36.9 C)  98.3 F (36.8 C)  TempSrc: Oral Oral  Oral  SpO2: (!) 89% 100% 96% 100%  Weight:      Height:        Intake/Output from previous day:   Intake/Output Summary (Last 24 hours) at 06/28/2022 1517 Last data filed at 06/28/2022 0538 Gross per 24 hour  Intake 286.4 ml  Output 750 ml  Net -463.6 ml    Physical Exam: General: Alert, awake, oriented x3, in no acute distress.  HEENT: [place]/AT PEERL, EOMI Neck: Trachea midline,  no masses, no thyromegal,y no JVD, no carotid bruit OROPHARYNX:  Moist, No exudate/ erythema/lesions.  Heart: Regular rate and rhythm, without murmurs, rubs, gallops, PMI non-displaced, no heaves or thrills on palpation.  Lungs: Clear to auscultation, no wheezing or rhonchi noted. No increased vocal fremitus resonant to percussion  Abdomen: Soft, nontender, nondistended, positive bowel sounds, no masses no  hepatosplenomegaly noted..  Neuro: No focal neurological deficits noted cranial nerves II through XII grossly intact. DTRs 2+ bilaterally upper and lower extremities. Strength 5 out of 5 in bilateral upper and lower extremities. Musculoskeletal: No warm swelling or erythema around joints, no spinal tenderness noted. Psychiatric: Patient alert and oriented x3, good insight and cognition, good recent to remote recall. Lymph node survey: No cervical axillary or inguinal lymphadenopathy noted.  .media Lab Results:  Basic Metabolic Panel:    Component Value Date/Time   NA 136 06/26/2022 1306   NA 140 10/02/2017 1126   K 4.0 06/26/2022 1306   CL 105 06/26/2022 1306   CO2 25 06/26/2022 1306   BUN 14 06/26/2022 1306   BUN 9 10/02/2017 1126   CREATININE 0.89 06/26/2022 1306   GLUCOSE 125 (H) 06/26/2022 1306   CALCIUM 9.1 06/26/2022 1306   CBC:    Component Value Date/Time   WBC 17.4 (H) 06/28/2022 0531   HGB 8.7 (L) 06/28/2022 0531   HGB 7.5 (L) 10/02/2017 1126   HCT 26.0 (L) 06/28/2022 0531   HCT 22.0 (L) 10/02/2017 1126   PLT 457 (H) 06/28/2022 0531   PLT 511 (H) 10/02/2017 1126   MCV 97.7 06/28/2022 0531   MCV 94 10/02/2017 1126   NEUTROABS 19.3 (H) 06/26/2022 1306   NEUTROABS 8.8 (H) 10/02/2017 1126   LYMPHSABS 0.9 06/26/2022 1306   LYMPHSABS 5.2 (H) 10/02/2017 1126   MONOABS 2.0 (  H) 06/26/2022 1306   EOSABS 0.0 06/26/2022 1306   EOSABS 0.2 10/02/2017 1126   BASOSABS 0.1 06/26/2022 1306   BASOSABS 0.1 10/02/2017 1126    No results found for this or any previous visit (from the past 240 hour(s)).  Studies/Results: No results found.  Medications: Scheduled Meds:  ARIPiprazole  5 mg Oral QHS   busPIRone  20 mg Oral QHS   cholecalciferol  3,000 Units Oral Daily   doxycycline  100 mg Oral Q12H   enoxaparin (LOVENOX) injection  40 mg Subcutaneous W44Q   folic acid  1 mg Oral q AM   HYDROmorphone   Intravenous Q4H   hydroxyurea  500 mg Oral BID   ketorolac  15 mg  Intravenous Q6H   mirtazapine  15 mg Oral QHS   oxyCODONE-acetaminophen  1 tablet Oral Once   senna-docusate  1 tablet Oral BID   Continuous Infusions: PRN Meds:.acetaminophen, camphor-menthol, diphenhydrAMINE, naloxone **AND** sodium chloride flush, ondansetron (ZOFRAN) IV, oxyCODONE, polyethylene glycol  Consultants: None  Procedures: None  Antibiotics: None  Assessment/Plan: Principal Problem:   Sickle cell pain crisis (HCC) Active Problems:   Anemia of chronic disease   Transaminitis   Thrombocytosis  Hb Sickle Cell Disease with Pain crisis: Continue [facility] at [facility], continue weight based Dilaudid PCA, IV Toradol 30 mg Q 6 H, Monitor vitals very closely, Re-evaluate pain scale regularly, 2 L of Oxygen by [place]. Leukocytosis:  Anemia of Chronic Disease:  Chronic pain Syndrome:  Medication non-compliance  Code Status: Full Code Family Communication: N/A Disposition Plan: Not yet ready for discharge  Caitlinn Klinker  If 7PM-7AM, please contact night-coverage.  06/28/2022, 3:17 PM  LOS: 2 days

## 2022-06-28 NOTE — Consult Note (Signed)
WOC Nurse Consult Note: Reason for Consult:full thickness lesion and few small satellite lesions on LLE. Photo taken by Dr. Gabrielle Dare today and uploaded to EMR is appreciated. Wound type:full thickness Pressure Injury POA: N/A Measurement:Largest wound measures 1.5cm round x 0.2cm (per Nursing Flow Sheet) Wound XFG:HWEX, moist Drainage (amount, consistency, odor) small serous Periwound: intact, dry (few other small satellite lesions, also full thickness) Dressing procedure/placement/frequency: I have provided Nursing with topical care guidance via the orders for a daily soap and water cleanse, rinse and dry followed by application of a thin layer of bacitracin ointment topped with dry gauze 2x2 and covered with a silicone foam bordered dressing.  [facility] nursing team will not follow, but will remain available to this patient, the nursing and medical teams.  Please re-consult if needed.  Thank you for inviting [place] to participate in this patient's Plan of Care.  Maudie Flakes, MSN, RN, CNS, [place], Serita Grammes, [employer], [employer] phone:  [phone]

## 2022-06-28 NOTE — Progress Notes (Signed)
   06/28/22 0526  Assess: MEWS Score  Temp (!) 101.8 F (38.8 C)  BP 129/80  MAP (mmHg) 92  Pulse Rate (!) 103  Resp 18  SpO2 90 %  O2 Device Room Air  Assess: MEWS Score  MEWS Temp 2  MEWS Systolic 0  MEWS Pulse 1  MEWS RR 0  MEWS LOC 0  MEWS Score 3  MEWS Score Color Yellow  Assess: if the MEWS score is Yellow or Red  Were vital signs taken at a resting state? Yes  Focused Assessment No change from prior assessment  Does the patient meet 2 or more of the SIRS criteria? No  MEWS guidelines implemented *See Row Information* Yes  Treat  MEWS Interventions Escalated (See documentation below) (provider notified)  Pain Scale 0-10  Pain Score 8  Pain Type Acute pain  Pain Location Knee  Pain Orientation Left  Pain Descriptors / Indicators Aching;Constant  Pain Frequency Constant  Pain Onset On-going  Pain Intervention(s) Medication (See eMAR)  Take Vital Signs  Increase Vital Sign Frequency  Yellow: Q 2hr X 2 then Q 4hr X 2, if remains yellow, continue Q 4hrs  Escalate  MEWS: Escalate Yellow: discuss with charge nurse/RN and consider discussing with provider and RRT  Notify: Charge Nurse/RN  Name of Charge Nurse/RN Notified Rex, RN  Date Charge Nurse/RN Notified 06/28/22  Time Charge Nurse/RN Notified 0530  Notify: Provider  Provider Name/Title Zebedee Iba, NP  Date Provider Notified 06/28/22  Time Provider Notified 0530  Method of Notification Page  Notification Reason Change in status  Provider response See new orders  Date of Provider Response 06/28/22  Assess: SIRS CRITERIA  SIRS Temperature  1  SIRS Pulse 1  SIRS Respirations  0  SIRS WBC 1  SIRS Score Sum  3

## 2022-06-29 DIAGNOSIS — L97929 Non-pressure chronic ulcer of unspecified part of left lower leg with unspecified severity: Secondary | ICD-10-CM

## 2022-06-29 DIAGNOSIS — L97919 Non-pressure chronic ulcer of unspecified part of right lower leg with unspecified severity: Secondary | ICD-10-CM

## 2022-06-29 DIAGNOSIS — D75839 Thrombocytosis, unspecified: Secondary | ICD-10-CM | POA: Diagnosis not present

## 2022-06-29 DIAGNOSIS — D5700 Hb-SS disease with crisis, unspecified: Secondary | ICD-10-CM | POA: Diagnosis not present

## 2022-06-29 DIAGNOSIS — D638 Anemia in other chronic diseases classified elsewhere: Secondary | ICD-10-CM | POA: Diagnosis not present

## 2022-06-29 DIAGNOSIS — R7401 Elevation of levels of liver transaminase levels: Secondary | ICD-10-CM | POA: Diagnosis not present

## 2022-06-29 NOTE — Progress Notes (Signed)
Patient ID: Jeffrey Morrison, male   DOB: [birth year], 26 y.o.   MRN: [id number] Subjective: Jeffrey Morrison is a 26 year old male with a medical history significant for sickle cell disease was admitted for sickle cell pain crisis.  Patient is currently incarcerated and is accompanied by guard.  Patient has no new complaint today.  Bilateral lower extremity pain slightly improved from yesterday.  Continues to complain of knee joints and right hip pain.  He denies any fever, cough, chest pain, shortness of breath, nausea, vomiting or diarrhea.  He is tolerating p.o. intake okay.  No urinary symptoms.  Objective:  Vital signs in last 24 hours:  Vitals:   06/29/22 0700 06/29/22 0725 06/29/22 0951 06/29/22 1211  BP:   105/64   Pulse:   79   Resp:  '18 14 17  '$ Temp:   98.7 F (37.1 C)   TempSrc:   Oral   SpO2:  100% 98%   Weight: 101.9 kg     Height:        Intake/Output from previous day:   Intake/Output Summary (Last 24 hours) at 06/29/2022 1236 Last data filed at 06/29/2022 0000 Gross per 24 hour  Intake 240.9 ml  Output 700 ml  Net -459.1 ml     Physical Exam: General: Alert, awake, oriented x3, in no acute distress.  HEENT: [place]/AT [place]ER[place], EOMI Neck: Trachea midline,  no masses, no thyromegal,y no JVD, no carotid bruit OROPHARYNX:  Moist, No exudate/ erythema/lesions.  Heart: Regular rate and rhythm, without murmurs, rubs, gallops, PMI non-displaced, no heaves or thrills on palpation.  Lungs: Clear to auscultation, no wheezing or rhonchi noted. No increased vocal fremitus resonant to percussion  Abdomen: Soft, nontender, nondistended, positive bowel sounds, no masses no hepatosplenomegaly noted..  Neuro: No focal neurological deficits noted cranial nerves II through XII grossly intact. DTRs 2+ bilaterally upper and lower extremities. Strength 5 out of 5 in bilateral upper and lower extremities. Musculoskeletal: Bilateral lower extremity puffiness and multiple scabbed wounds and few  open wounds, some draining serosanguineous fluid.  No foul-smelling.  No warm swelling or erythema around joints, no spinal tenderness noted. Psychiatric: Patient alert and oriented x3, good insight and cognition, good recent to remote recall. Lymph node survey: No cervical axillary or inguinal lymphadenopathy noted. Media Information   Document Information  06/28/2022 12:40  Attached To:  Hospital Encounter on 06/26/22   Lab Results:  Basic Metabolic Panel:    Component Value Date/Time   NA 136 06/26/2022 1306   NA 140 10/02/2017 1126   K 4.0 06/26/2022 1306   CL 105 06/26/2022 1306   CO2 25 06/26/2022 1306   BUN 14 06/26/2022 1306   BUN 9 10/02/2017 1126   CREATININE 0.89 06/26/2022 1306   GLUCOSE 125 (H) 06/26/2022 1306   CALCIUM 9.1 06/26/2022 1306   CBC:    Component Value Date/Time   WBC 17.4 (H) 06/28/2022 0531   HGB 8.7 (L) 06/28/2022 0531   HGB 7.5 (L) 10/02/2017 1126   HCT 26.0 (L) 06/28/2022 0531   HCT 22.0 (L) 10/02/2017 1126   PLT 457 (H) 06/28/2022 0531   PLT 511 (H) 10/02/2017 1126   MCV 97.7 06/28/2022 0531   MCV 94 10/02/2017 1126   NEUTROABS 19.3 (H) 06/26/2022 1306   NEUTROABS 8.8 (H) 10/02/2017 1126   LYMPHSABS 0.9 06/26/2022 1306   LYMPHSABS 5.2 (H) 10/02/2017 1126   MONOABS 2.0 (H) 06/26/2022 1306   EOSABS 0.0 06/26/2022 1306   EOSABS 0.2 10/02/2017 1126  BASOSABS 0.1 06/26/2022 1306   BASOSABS 0.1 10/02/2017 1126    No results found for this or any previous visit (from the past 240 hour(s)).  Studies/Results: No results found.  Medications: Scheduled Meds:  ARIPiprazole  5 mg Oral QHS   bacitracin   Topical Daily   busPIRone  20 mg Oral QHS   cholecalciferol  3,000 Units Oral Daily   doxycycline  100 mg Oral Q12H   enoxaparin (LOVENOX) injection  40 mg Subcutaneous F41S   folic acid  1 mg Oral q AM   HYDROmorphone   Intravenous Q4H   hydroxyurea  500 mg Oral BID   ketorolac  15 mg Intravenous Q6H   mirtazapine  15 mg Oral QHS    oxyCODONE-acetaminophen  1 tablet Oral Once   senna-docusate  1 tablet Oral BID   Continuous Infusions: PRN Meds:.acetaminophen, camphor-menthol, diphenhydrAMINE, naloxone **AND** sodium chloride flush, ondansetron (ZOFRAN) IV, oxyCODONE, polyethylene glycol  Consultants: None  Procedures: None  Antibiotics: None  Assessment/Plan: Principal Problem:   Sickle cell pain crisis (HCC) Active Problems:   Anemia of chronic disease   Transaminitis   Thrombocytosis  Hb Sickle Cell Disease with Pain crisis: Continue [facility] at [facility], continue weight based Dilaudid PCA at current dose setting, will begin to wean later today, continue IV Toradol 15 mg Q 6 H for total of 5 days, continue oxycodone 10 mg every 4 hours as needed for pain. Monitor vitals very closely, Re-evaluate pain scale regularly, 2 L of Oxygen by [place]. Leukocytosis: Most likely reactive to sickle cell crisis as well as cellulitis bilateral lower extremities.  Patient is on oral antibiotics, will continue.  No temperature spike for over 24 hours, will continue on doxycycline. Multiple leg ulcers bilateral lower extremities: Wound care nurse consult and recommendations appreciated. Patient to continue daily wound dressings with bacitracin ointment topped with dry gauze and covered with a silicone foam.  We will continue oral antibiotics. Anemia of Chronic Disease: Hemoglobin continues to be stable at baseline, there is no clinical indication for blood transfusion. We will continue to monitor closely and transfuse as appropriate. Chronic pain Syndrome: Continue home pain medications as ordered.  Code Status: Full Code Family Communication: N/A Disposition Plan: Not yet ready for discharge  Shaketha Jeon  If 7PM-7AM, please contact night-coverage.  06/29/2022, 12:36 PM  LOS: 3 days

## 2022-06-29 NOTE — Progress Notes (Signed)
  Transition of Care [facility]) Screening Note   Patient Details  Name: Jeffrey Morrison Date of Birth: 1996/05/15   Transition of Care [facility]) CM/SW Contact:    Vassie Moselle, LCSW Phone Number: 06/29/2022, 11:50 AM  TOC consulted for SNF placement and Medication assistance. Pt coming from jail and is currently in custody of [facility].  No SNF placement or medication needs identified. Pt will be returning to jail at discharge.    Transition of Care Department [facility]) has reviewed patient and no TOC needs have been identified at this time. We will continue to monitor patient advancement through interdisciplinary progression rounds. If new patient transition needs arise, please place a TOC consult.

## 2022-06-30 DIAGNOSIS — D5700 Hb-SS disease with crisis, unspecified: Secondary | ICD-10-CM | POA: Diagnosis not present

## 2022-06-30 LAB — CBC WITH DIFFERENTIAL/PLATELET
Abs Immature Granulocytes: 0.09 10*3/uL — ABNORMAL HIGH (ref 0.00–0.07)
Basophils Absolute: 0 10*3/uL (ref 0.0–0.1)
Basophils Relative: 0 %
Eosinophils Absolute: 0.4 10*3/uL (ref 0.0–0.5)
Eosinophils Relative: 3 %
HCT: 22.3 % — ABNORMAL LOW (ref 39.0–52.0)
Hemoglobin: 7.5 g/dL — ABNORMAL LOW (ref 13.0–17.0)
Immature Granulocytes: 1 %
Lymphocytes Relative: 20 %
Lymphs Abs: 2.8 10*3/uL (ref 0.7–4.0)
MCH: 32.6 pg (ref 26.0–34.0)
MCHC: 33.6 g/dL (ref 30.0–36.0)
MCV: 97 fL (ref 80.0–100.0)
Monocytes Absolute: 1.3 10*3/uL — ABNORMAL HIGH (ref 0.1–1.0)
Monocytes Relative: 9 %
Neutro Abs: 9.5 10*3/uL — ABNORMAL HIGH (ref 1.7–7.7)
Neutrophils Relative %: 67 %
Platelets: 525 10*3/uL — ABNORMAL HIGH (ref 150–400)
RBC: 2.3 MIL/uL — ABNORMAL LOW (ref 4.22–5.81)
RDW: 19.5 % — ABNORMAL HIGH (ref 11.5–15.5)
WBC: 14.1 10*3/uL — ABNORMAL HIGH (ref 4.0–10.5)
nRBC: 6 % — ABNORMAL HIGH (ref 0.0–0.2)

## 2022-06-30 LAB — COMPREHENSIVE METABOLIC PANEL
ALT: 51 U/L — ABNORMAL HIGH (ref 0–44)
AST: 32 U/L (ref 15–41)
Albumin: 3.2 g/dL — ABNORMAL LOW (ref 3.5–5.0)
Alkaline Phosphatase: 126 U/L (ref 38–126)
Anion gap: 6 (ref 5–15)
BUN: 15 mg/dL (ref 6–20)
CO2: 26 mmol/L (ref 22–32)
Calcium: 8.7 mg/dL — ABNORMAL LOW (ref 8.9–10.3)
Chloride: 107 mmol/L (ref 98–111)
Creatinine, Ser: 1 mg/dL (ref 0.61–1.24)
GFR, Estimated: >60 mL/min (ref >60)
Glucose, Bld: 119 mg/dL — ABNORMAL HIGH (ref 70–99)
Potassium: 4.2 mmol/L (ref 3.5–5.1)
Sodium: 139 mmol/L (ref 135–145)
Total Bilirubin: 2 mg/dL — ABNORMAL HIGH (ref 0.3–1.2)
Total Protein: 7.2 g/dL (ref 6.5–8.1)

## 2022-06-30 MED ORDER — SALINE SPRAY 0.65 % NA SOLN
1.0000 | NASAL | Status: DC | PRN
  Administered 2022-06-30: 1 via NASAL
  Filled 2022-06-30: qty 44

## 2022-06-30 NOTE — Progress Notes (Signed)
Subjective: Jeffrey Morrison is a 26 year old male with a medical history significant for sickle cell disease was admitted for sickle cell pain crisis.  Patient is currently incarcerated and is accompanied by guard.  No new complaints on today.  Continues to have pain primarily to upper and lower extremities.  Rates pain as 10/10. He denies any headache, chest pain, shortness of breath, urinary symptoms, nausea, vomiting, or diarrhea.  Patient has been afebrile overnight.  Objective:  Vital signs in last 24 hours:  Vitals:   06/30/22 0614 06/30/22 0626 06/30/22 0745 06/30/22 1005  BP: (!) 112/58   (!) 105/59  Pulse: 77   (!) 56  Resp: '16 20 19 16  '$ Temp: 98.3 F (36.8 C)   97.8 F (36.6 C)  TempSrc: Oral   Oral  SpO2: 95% 97% 96% 93%  Weight:      Height:        Intake/Output from previous day:   Intake/Output Summary (Last 24 hours) at 06/30/2022 1232 Last data filed at 06/30/2022 0334 Gross per 24 hour  Intake 482.4 ml  Output 2300 ml  Net -1817.6 ml    Physical Exam: General: Alert, awake, oriented x3, in no acute distress.  HEENT: [place]/AT [place]ER[place], EOMI Neck: Trachea midline,  no masses, no thyromegal,y no JVD, no carotid bruit OROPHARYNX:  Moist, No exudate/ erythema/lesions.  Heart: Regular rate and rhythm, without murmurs, rubs, gallops, PMI non-displaced, no heaves or thrills on palpation.  Lungs: Clear to auscultation, no wheezing or rhonchi noted. No increased vocal fremitus resonant to percussion  Abdomen: Soft, nontender, nondistended, positive bowel sounds, no masses no hepatosplenomegaly noted..  Neuro: No focal neurological deficits noted cranial nerves II through XII grossly intact. DTRs 2+ bilaterally upper and lower extremities. Strength 5 out of 5 in bilateral upper and lower extremities. Musculoskeletal: No warm swelling or erythema around joints, no spinal tenderness noted. Psychiatric: Patient alert and oriented x3, good insight and cognition, good recent to  remote recall. Lymph node survey: No cervical axillary or inguinal lymphadenopathy noted. Skin: Bilateral lower extremities dry, excoriated.  90% granulated wound to left lateral shin.  Mild edema to bilateral lower extremities.  No erythema noted. Lab Results:  Basic Metabolic Panel:    Component Value Date/Time   NA 139 06/30/2022 0440   NA 140 10/02/2017 1126   K 4.2 06/30/2022 0440   CL 107 06/30/2022 0440   CO2 26 06/30/2022 0440   BUN 15 06/30/2022 0440   BUN 9 10/02/2017 1126   CREATININE 1.00 06/30/2022 0440   GLUCOSE 119 (H) 06/30/2022 0440   CALCIUM 8.7 (L) 06/30/2022 0440   CBC:    Component Value Date/Time   WBC 14.1 (H) 06/30/2022 0440   HGB 7.5 (L) 06/30/2022 0440   HGB 7.5 (L) 10/02/2017 1126   HCT 22.3 (L) 06/30/2022 0440   HCT 22.0 (L) 10/02/2017 1126   PLT 525 (H) 06/30/2022 0440   PLT 511 (H) 10/02/2017 1126   MCV 97.0 06/30/2022 0440   MCV 94 10/02/2017 1126   NEUTROABS 9.5 (H) 06/30/2022 0440   NEUTROABS 8.8 (H) 10/02/2017 1126   LYMPHSABS 2.8 06/30/2022 0440   LYMPHSABS 5.2 (H) 10/02/2017 1126   MONOABS 1.3 (H) 06/30/2022 0440   EOSABS 0.4 06/30/2022 0440   EOSABS 0.2 10/02/2017 1126   BASOSABS 0.0 06/30/2022 0440   BASOSABS 0.1 10/02/2017 1126    No results found for this or any previous visit (from the past 240 hour(s)).  Studies/Results: No results found.  Medications:  Scheduled Meds:  ARIPiprazole  5 mg Oral QHS   bacitracin   Topical Daily   busPIRone  20 mg Oral QHS   cholecalciferol  3,000 Units Oral Daily   doxycycline  100 mg Oral Q12H   enoxaparin (LOVENOX) injection  40 mg Subcutaneous G31D   folic acid  1 mg Oral q AM   HYDROmorphone   Intravenous Q4H   hydroxyurea  500 mg Oral BID   ketorolac  15 mg Intravenous Q6H   mirtazapine  15 mg Oral QHS   senna-docusate  1 tablet Oral BID   Continuous Infusions: PRN Meds:.acetaminophen, camphor-menthol, diphenhydrAMINE, naloxone **AND** sodium chloride flush, ondansetron  (ZOFRAN) IV, oxyCODONE, polyethylene glycol  Consultants: none  Procedures: none  Antibiotics: none  Assessment/Plan: Principal Problem:   Sickle cell pain crisis (HCC) Active Problems:   Anemia of chronic disease   Transaminitis   Thrombocytosis   Ulcers of both lower legs (HCC)  Sickle cell disease with pain crisis: Decrease IV fluids to KVO Continue IV Dilaudid PCA, no changes Toradol 15 mg IV every 6 hours Oxycodone 10 mg every 4 hours as needed for severe breakthrough pain Monitor vital signs very closely, reevaluate pain scale regularly, and supplemental oxygen as needed.  Multiple leg ulcers bilateral lower extremities: Patient has multiple granulated wounds to bilateral lower extremity.  Patient has been scratching his lower extremities, excoriation is due to constantly scratching.  Initiate doxycycline 100 mg twice daily continue Benadryl 25 mg every 4 hours as needed for itching.  Daily wound dressings with bacitracin ointment top with dry gauze and covered with a silicone foam.  Leukocytosis: Stable.  Oral antibiotics.  Patient afebrile.  We will continue to monitor closely.  Labs in AM.  Thrombocytosis: Stable.  Secondary to sickle cell crisis.  Continue to monitor closely.   Code Status: Full Code Family Communication: N/A Disposition Plan: Not yet ready for discharge  [place], MSN, FNP-C Patient [place] [address] [place], [place] [address] [phone]   If 7PM-7AM, please contact night-coverage.  06/30/2022, 12:32 PM  LOS: 4 days

## 2022-07-01 DIAGNOSIS — D5700 Hb-SS disease with crisis, unspecified: Secondary | ICD-10-CM | POA: Diagnosis not present

## 2022-07-01 NOTE — Progress Notes (Signed)
Subjective: Jeffrey Morrison is a 26 year old male with a medical history significant for sickle cell disease was admitted for sickle cell pain crisis.  Patient is currently incarcerated and is accompanied by guard.  No new complaints on today.  Continues to have pain primarily to upper and lower extremities.  Rates pain as 810. He denies any headache, chest pain, shortness of breath, urinary symptoms, nausea, vomiting, or diarrhea.  Patient has been afebrile overnight.  Objective:  Vital signs in last 24 hours:  Vitals:   06/30/22 2313 07/01/22 0229 07/01/22 0242 07/01/22 0616  BP:  108/63  119/64  Pulse:  (!) 56  (!) 58  Resp: '14 18 14 18  '$ Temp:  98.2 F (36.8 C)  98.6 F (37 C)  TempSrc:  Oral  Oral  SpO2: 100% 100% 98% 100%  Weight:      Height:        Intake/Output from previous day:   Intake/Output Summary (Last 24 hours) at 07/01/2022 0911 Last data filed at 07/01/2022 0100 Gross per 24 hour  Intake 724.5 ml  Output 1100 ml  Net -375.5 ml    Physical Exam: General: Alert, awake, oriented x3, in no acute distress.  HEENT: [place]/AT PEERL, EOMI Neck: Trachea midline,  no masses, no thyromegal,y no JVD, no carotid bruit OROPHARYNX:  Moist, No exudate/ erythema/lesions.  Heart: Regular rate and rhythm, without murmurs, rubs, gallops, PMI non-displaced, no heaves or thrills on palpation.  Lungs: Clear to auscultation, no wheezing or rhonchi noted. No increased vocal fremitus resonant to percussion  Abdomen: Soft, nontender, nondistended, positive bowel sounds, no masses no hepatosplenomegaly noted..  Neuro: No focal neurological deficits noted cranial nerves II through XII grossly intact. DTRs 2+ bilaterally upper and lower extremities. Strength 5 out of 5 in bilateral upper and lower extremities. Musculoskeletal: No warm swelling or erythema around joints, no spinal tenderness noted. Psychiatric: Patient alert and oriented x3, good insight and cognition, good recent to remote  recall. Lymph node survey: No cervical axillary or inguinal lymphadenopathy noted. Skin: Bilateral lower extremities dry, excoriated.  90% granulated wound to left lateral shin.  Mild edema to bilateral lower extremities.  No erythema noted. Lab Results:  Basic Metabolic Panel:    Component Value Date/Time   NA 139 06/30/2022 0440   NA 140 10/02/2017 1126   K 4.2 06/30/2022 0440   CL 107 06/30/2022 0440   CO2 26 06/30/2022 0440   BUN 15 06/30/2022 0440   BUN 9 10/02/2017 1126   CREATININE 1.00 06/30/2022 0440   GLUCOSE 119 (H) 06/30/2022 0440   CALCIUM 8.7 (L) 06/30/2022 0440   CBC:    Component Value Date/Time   WBC 14.1 (H) 06/30/2022 0440   HGB 7.5 (L) 06/30/2022 0440   HGB 7.5 (L) 10/02/2017 1126   HCT 22.3 (L) 06/30/2022 0440   HCT 22.0 (L) 10/02/2017 1126   PLT 525 (H) 06/30/2022 0440   PLT 511 (H) 10/02/2017 1126   MCV 97.0 06/30/2022 0440   MCV 94 10/02/2017 1126   NEUTROABS 9.5 (H) 06/30/2022 0440   NEUTROABS 8.8 (H) 10/02/2017 1126   LYMPHSABS 2.8 06/30/2022 0440   LYMPHSABS 5.2 (H) 10/02/2017 1126   MONOABS 1.3 (H) 06/30/2022 0440   EOSABS 0.4 06/30/2022 0440   EOSABS 0.2 10/02/2017 1126   BASOSABS 0.0 06/30/2022 0440   BASOSABS 0.1 10/02/2017 1126    No results found for this or any previous visit (from the past 240 hour(s)).  Studies/Results: No results found.  Medications: Scheduled  Meds:  ARIPiprazole  5 mg Oral QHS   bacitracin   Topical Daily   busPIRone  20 mg Oral QHS   cholecalciferol  3,000 Units Oral Daily   doxycycline  100 mg Oral Q12H   enoxaparin (LOVENOX) injection  40 mg Subcutaneous Z66A   folic acid  1 mg Oral q AM   HYDROmorphone   Intravenous Q4H   hydroxyurea  500 mg Oral BID   ketorolac  15 mg Intravenous Q6H   mirtazapine  15 mg Oral QHS   senna-docusate  1 tablet Oral BID   Continuous Infusions: PRN Meds:.acetaminophen, camphor-menthol, diphenhydrAMINE, naloxone **AND** sodium chloride flush, ondansetron (ZOFRAN) IV,  oxyCODONE, polyethylene glycol, sodium chloride  Consultants: none  Procedures: none  Antibiotics: none  Assessment/Plan: Principal Problem:   Sickle cell pain crisis (HCC) Active Problems:   Anemia of chronic disease   Transaminitis   Thrombocytosis   Ulcers of both lower legs (HCC)  Sickle cell disease with pain crisis: Continue IV Dilaudid PCA, no changes Toradol 15 mg IV every 6 hours Oxycodone 10 mg every 4 hours as needed for severe breakthrough pain Monitor vital signs very closely, reevaluate pain scale regularly, and supplemental oxygen as needed.  Multiple leg ulcers bilateral lower extremities: Patient has multiple granulated wounds to bilateral lower extremity.  Patient has been scratching his lower extremities, excoriation is due to constantly scratching.  Continue doxycycline 100 mg twice daily continue Benadryl 25 mg every 4 hours as needed for itching.  Daily wound dressings with bacitracin ointment top with dry gauze and covered with a silicone foam.  Leukocytosis: Stable.  Oral antibiotics.  Patient afebrile.  We will continue to monitor closely.  Labs in AM.  Thrombocytosis: Stable.  Secondary to sickle cell crisis.  Continue to monitor closely.   Code Status: Full Code Family Communication: N/A Disposition Plan: Not yet ready for discharge  [place], MSN, FNP-C Patient [facility] [address] [place],  [address] [phone]   If 7PM-7AM, please contact night-coverage.  07/01/2022, 9:11 AM  LOS: 5 days

## 2022-07-02 DIAGNOSIS — D5700 Hb-SS disease with crisis, unspecified: Secondary | ICD-10-CM | POA: Diagnosis not present

## 2022-07-02 MED ORDER — ACETAMINOPHEN 500 MG PO TABS: 1000.0000 mg | ORAL_TABLET | Freq: Four times a day (QID) | ORAL | Status: DC | PRN

## 2022-07-02 MED ORDER — OXYCODONE HCL 5 MG PO TABS
10.0000 mg | ORAL_TABLET | ORAL | Status: DC
  Administered 2022-07-02 – 2022-07-04 (×9): 10 mg via ORAL
  Filled 2022-07-02 (×9): qty 2

## 2022-07-02 MED ORDER — IBUPROFEN 800 MG PO TABS: 800.0000 mg | ORAL_TABLET | Freq: Three times a day (TID) | ORAL | Status: DC | PRN

## 2022-07-02 MED ORDER — HYDROMORPHONE HCL 1 MG/ML IJ SOLN
1.0000 mg | INTRAMUSCULAR | Status: DC | PRN
  Administered 2022-07-02 – 2022-07-04 (×6): 1 mg via INTRAVENOUS
  Filled 2022-07-02 (×6): qty 1

## 2022-07-02 NOTE — Progress Notes (Addendum)
Subjective: Jeffrey Morrison is a 26 year old male with a medical history significant for sickle cell disease was admitted for sickle cell pain crisis.  Patient is currently incarcerated and is accompanied by guard.  No new complaints on today.  Continues to have pain primarily to upper and lower extremities. Primarily left lower extremity. Reports difficulty with weight bearing. Rates pain as 7/10. He denies any headache, chest pain, shortness of breath, urinary symptoms, nausea, vomiting, or diarrhea.  Patient has been afebrile overnight.  Objective:  Vital signs in last 24 hours:  Vitals:   07/02/22 0614 07/02/22 0823 07/02/22 1012 07/02/22 1122  BP: 128/61  (!) 110/59   Pulse: 73  79   Resp: '18 15  14  '$ Temp: 99.6 F (37.6 C)  99.1 F (37.3 C)   TempSrc: Oral  Oral   SpO2: 95% 95% 90% 94%  Weight:      Height:        Intake/Output from previous day:   Intake/Output Summary (Last 24 hours) at 07/02/2022 1305 Last data filed at 07/02/2022 0400 Gross per 24 hour  Intake --  Output 1200 ml  Net -1200 ml    Physical Exam: General: Alert, awake, oriented x3, in no acute distress.  HEENT: [place]/AT [place]ER[place], EOMI Neck: Trachea midline,  no masses, no thyromegal,y no JVD, no carotid bruit OROPHARYNX:  Moist, No exudate/ erythema/lesions.  Heart: Regular rate and rhythm, without murmurs, rubs, gallops, PMI non-displaced, no heaves or thrills on palpation.  Lungs: Clear to auscultation, no wheezing or rhonchi noted. No increased vocal fremitus resonant to percussion  Abdomen: Soft, nontender, nondistended, positive bowel sounds, no masses no hepatosplenomegaly noted..  Neuro: No focal neurological deficits noted cranial nerves II through XII grossly intact. DTRs 2+ bilaterally upper and lower extremities. Strength 5 out of 5 in bilateral upper and lower extremities. Musculoskeletal: No warm swelling or erythema around joints, no spinal tenderness noted. Psychiatric: Patient alert and  oriented x3, good insight and cognition, good recent to remote recall. Lymph node survey: No cervical axillary or inguinal lymphadenopathy noted. Skin: Bilateral lower extremities dry, excoriated.  90% granulated wound to left lateral shin.  Mild edema to bilateral lower extremities.  No erythema noted. Lab Results:  Basic Metabolic Panel:    Component Value Date/Time   NA 139 06/30/2022 0440   NA 140 10/02/2017 1126   K 4.2 06/30/2022 0440   CL 107 06/30/2022 0440   CO2 26 06/30/2022 0440   BUN 15 06/30/2022 0440   BUN 9 10/02/2017 1126   CREATININE 1.00 06/30/2022 0440   GLUCOSE 119 (H) 06/30/2022 0440   CALCIUM 8.7 (L) 06/30/2022 0440   CBC:    Component Value Date/Time   WBC 14.1 (H) 06/30/2022 0440   HGB 7.5 (L) 06/30/2022 0440   HGB 7.5 (L) 10/02/2017 1126   HCT 22.3 (L) 06/30/2022 0440   HCT 22.0 (L) 10/02/2017 1126   PLT 525 (H) 06/30/2022 0440   PLT 511 (H) 10/02/2017 1126   MCV 97.0 06/30/2022 0440   MCV 94 10/02/2017 1126   NEUTROABS 9.5 (H) 06/30/2022 0440   NEUTROABS 8.8 (H) 10/02/2017 1126   LYMPHSABS 2.8 06/30/2022 0440   LYMPHSABS 5.2 (H) 10/02/2017 1126   MONOABS 1.3 (H) 06/30/2022 0440   EOSABS 0.4 06/30/2022 0440   EOSABS 0.2 10/02/2017 1126   BASOSABS 0.0 06/30/2022 0440   BASOSABS 0.1 10/02/2017 1126    No results found for this or any previous visit (from the past 240 hour(s)).  Studies/Results:  No results found.  Medications: Scheduled Meds:  ARIPiprazole  5 mg Oral QHS   bacitracin   Topical Daily   busPIRone  20 mg Oral QHS   cholecalciferol  3,000 Units Oral Daily   doxycycline  100 mg Oral Q12H   enoxaparin (LOVENOX) injection  40 mg Subcutaneous P54S   folic acid  1 mg Oral q AM   hydroxyurea  500 mg Oral BID   mirtazapine  15 mg Oral QHS   oxyCODONE  10 mg Oral Q4H while awake   senna-docusate  1 tablet Oral BID   Continuous Infusions: PRN Meds:.acetaminophen, camphor-menthol, diphenhydrAMINE, HYDROmorphone (DILAUDID)  injection, ibuprofen, polyethylene glycol, sodium chloride  Consultants: none  Procedures: none  Antibiotics: none  Assessment/Plan: Principal Problem:   Sickle cell pain crisis (HCC) Active Problems:   Anemia of chronic disease   Transaminitis   Thrombocytosis   Ulcers of both lower legs (HCC)  Sickle cell disease with pain crisis: Discontinue IV dilaudid PCA Dilaudid 1 mg every 3 hours as needed Toradol 15 mg IV every 6 hours Oxycodone 10 mg every 4 hours as needed for severe breakthrough pain Physical therapy to evaluate. Difficulty ambulating. Monitor vital signs very closely, reevaluate pain scale regularly, and supplemental oxygen as needed.  Multiple leg ulcers bilateral lower extremities: Patient has multiple granulated wounds to bilateral lower extremity.  Patient has been scratching his lower extremities, excoriation is due to constantly scratching.  Continue doxycycline 100 mg twice daily continue Benadryl 25 mg every 4 hours as needed for itching.  Daily wound dressings with bacitracin ointment top with dry gauze and covered with a silicone foam.  Leukocytosis: Stable.  Oral antibiotics.  Patient afebrile.  We will continue to monitor closely.  Labs in AM.  Thrombocytosis: Stable.  Secondary to sickle cell crisis.  Continue to monitor closely.   Code Status: Full Code Family Communication: N/A Disposition Plan: Not yet ready for discharge  [place], MSN, FNP-C Patient [facility] [address] [place], [place] [address] [phone]   If 7PM-7AM, please contact night-coverage.  07/02/2022, 1:05 PM  LOS: 6 days

## 2022-07-03 DIAGNOSIS — D5700 Hb-SS disease with crisis, unspecified: Secondary | ICD-10-CM | POA: Diagnosis not present

## 2022-07-03 MED ORDER — LIP MEDEX EX OINT
TOPICAL_OINTMENT | CUTANEOUS | Status: DC | PRN
  Filled 2022-07-03: qty 7

## 2022-07-03 NOTE — Evaluation (Signed)
Physical Therapy Evaluation Patient Details Name: Jeffrey Morrison MRN: [id number] DOB: September 12, 1996 Today's Date: 07/03/2022  History of Present Illness  Pt is a 26 y.o. male admitted for sickle cell pain crisis. PMH significant for sickle cell disease, depression, and chronic low back pain.   Clinical Impression  Pt is a 26 y.o. male with above HPI resulting in the deficits listed below (see PT Problem List). Pt performed sit to stand transfers with MIN guard for safety and cues for safe hand placement. Pt ambulated total of ~9f with use of RW and supervision. Pt demonstrated NWB on L LE due to pain-  hopping on R LE with increased WB through UEs on RW.  Pt will benefit from continued skilled PT services to maximize functional mobility and increase independence. Will trial use of crutches next session.       Recommendations for follow up therapy are one component of a multi-disciplinary discharge planning process, led by the attending physician.  Recommendations may be updated based on patient status, additional functional criteria and insurance authorization.  Follow Up Recommendations No PT follow up      Assistance Recommended at Discharge PRN  Patient can return home with the following  A little help with walking and/or transfers;A little help with bathing/dressing/bathroom;Help with stairs or ramp for entrance;Assistance with cooking/housework    Equipment Recommendations Other (comment) (TBD RW vs. crutches)  Recommendations for Other Services       Functional Status Assessment Patient has had a recent decline in their functional status and demonstrates the ability to make significant improvements in function in a reasonable and predictable amount of time.     Precautions / Restrictions Precautions Precautions: Fall Restrictions Weight Bearing Restrictions: No      Mobility  Bed Mobility Overal bed mobility: Needs Assistance Bed Mobility: Supine to Sit, Sit to Supine      Supine to sit: Min assist, HOB elevated Sit to supine: Min assist, HOB elevated   General bed mobility comments: assist for progression of L LE to EOB, assist for B LEs onto bed at end of session    Transfers Overall transfer level: Needs assistance Equipment used: Rolling [place] (2 wheels) Transfers: Sit to/from Stand Sit to Stand: Min guard                Ambulation/Gait Ambulation/Gait assistance: Supervision Gait Distance (Feet): 40 Feet Assistive device: Rolling [place] (2 wheels) Gait Pattern/deviations: Decreased stride length, Decreased weight shift to left, Narrow base of support Gait velocity: decreased     General Gait Details: pt demosntrating NWB on L LE and hopping on R, able to perform static stand with L LE on ground for increased WB but unable to tolerate ambulation with L LE. Increased UE fatigue with ambulation. Discussed use of SPC vs. RW. VS crutches. Pt and guard present report that space that pt is currently in is large enough for pt to use either AD discussed.  Stairs            Wheelchair Mobility    Modified Rankin (Stroke Patients Only)       Balance Overall balance assessment: Needs assistance Sitting-balance support: Feet supported Sitting balance-Leahy Scale: Good     Standing balance support: Bilateral upper extremity supported, During functional activity, Reliant on assistive device for balance Standing balance-Leahy Scale: Poor  Pertinent Vitals/Pain Pain Assessment Pain Assessment: 0-10 Pain Score: 7  Pain Location: L thigh to knee Pain Descriptors / Indicators: Tightness, Sore Pain Intervention(s): Limited activity within patient's tolerance, Monitored during session, Patient requesting pain meds-RN notified    Home Living Family/patient expects to be discharged to:: [facility]                        Prior Function Prior Level of Function :  Independent/Modified Independent             Mobility Comments: use of wooden SPC at times when having sickle cell crisis       Hand Dominance        Extremity/Trunk Assessment   Upper Extremity Assessment Upper Extremity Assessment: Overall WFL for tasks assessed    Lower Extremity Assessment Lower Extremity Assessment: RLE deficits/detail;LLE deficits/detail RLE Deficits / Details: grossly 4+/5 throughout LLE Deficits / Details: grossly 3-/5 throughout, difficult to fully assess due to pain    Cervical / Trunk Assessment Cervical / Trunk Assessment: Normal  Communication   Communication: No difficulties  Cognition Arousal/Alertness: Awake/alert Behavior During Therapy: WFL for tasks assessed/performed Overall Cognitive Status: Within Functional Limits for tasks assessed                                          General Comments      Exercises     Assessment/Plan    PT Assessment Patient needs continued PT services  PT Problem List Decreased strength;Decreased range of motion;Decreased activity tolerance;Decreased balance;Decreased mobility;Decreased knowledge of use of DME;Pain       PT Treatment Interventions DME instruction;Gait training;Functional mobility training;Therapeutic activities;Therapeutic exercise;Balance training;Patient/family education    PT Goals (Current goals can be found in the Care Plan section)  Acute Rehab PT Goals Patient Stated Goal: have less pain PT Goal Formulation: With patient Time For Goal Achievement: 07/17/22 Potential to Achieve Goals: Good    Frequency Min 3X/week     Co-evaluation               AM-PAC PT "6 Clicks" Mobility  Outcome Measure Help needed turning from your back to your side while in a flat bed without using bedrails?: None Help needed moving from lying on your back to sitting on the side of a flat bed without using bedrails?: A Little Help needed moving to and from a bed to  a chair (including a wheelchair)?: A Little Help needed standing up from a chair using your arms (e.g., wheelchair or bedside chair)?: A Little Help needed to walk in hospital room?: A Little Help needed climbing 3-5 steps with a railing? : A Lot 6 Click Score: 18    End of Session   Activity Tolerance: Patient tolerated treatment well;Patient limited by pain Patient left: in bed;with call bell/phone within reach (with guard present) Nurse Communication: Mobility status;Other (comment) (pt requesting pain meds) PT Visit Diagnosis: Difficulty in walking, not elsewhere classified (R26.2);Pain Pain - Right/Left: Left Pain - part of body: Leg    Time: 1251-1305 PT Time Calculation (min) (ACUTE ONLY): 14 min   Charges:   PT Evaluation $PT Eval Low Complexity: 1 Low         Festus Barren PT, DPT  Acute Rehabilitation Services  Office [phone]  07/03/2022, 1:37 PM

## 2022-07-03 NOTE — Progress Notes (Signed)
Subjective: Jeffrey Morrison is a 26 year old male with a medical history significant for sickle cell disease was admitted for sickle cell pain crisis.  Patient is currently incarcerated and is accompanied by guard.  No new complaints on today.  Continues to have pain primarily to upper and lower extremities. Primarily left lower extremity. Reports difficulty with weight bearing. Rates pain as 7/10. He denies any headache, chest pain, shortness of breath, urinary symptoms, nausea, vomiting, or diarrhea.  Patient has been afebrile overnight.  Objective:  Vital signs in last 24 hours:  Vitals:   07/03/22 2043 07/04/22 0207 07/04/22 0555 07/04/22 0854  BP: (!) 100/55 122/63 105/63 112/63  Pulse: 71 74 78 70  Resp: '16 16 16 18  '$ Temp: 99.2 F (37.3 C) 99 F (37.2 C) 99.5 F (37.5 C) 98.1 F (36.7 C)  TempSrc: Oral Oral Oral Oral  SpO2: 98% 94% 99% 95%  Weight:      Height:        Intake/Output from previous day:  No intake or output data in the 24 hours ending 07/07/22 1707   Physical Exam: General: Alert, awake, oriented x3, in no acute distress.  HEENT: [place]/AT PEERL, EOMI Neck: Trachea midline,  no masses, no thyromegal,y no JVD, no carotid bruit OROPHARYNX:  Moist, No exudate/ erythema/lesions.  Heart: Regular rate and rhythm, without murmurs, rubs, gallops, PMI non-displaced, no heaves or thrills on palpation.  Lungs: Clear to auscultation, no wheezing or rhonchi noted. No increased vocal fremitus resonant to percussion  Abdomen: Soft, nontender, nondistended, positive bowel sounds, no masses no hepatosplenomegaly noted..  Neuro: No focal neurological deficits noted cranial nerves II through XII grossly intact. DTRs 2+ bilaterally upper and lower extremities. Strength 5 out of 5 in bilateral upper and lower extremities. Musculoskeletal: No warm swelling or erythema around joints, no spinal tenderness noted. Psychiatric: Patient alert and oriented x3, good insight and cognition,  good recent to remote recall. Lymph node survey: No cervical axillary or inguinal lymphadenopathy noted. Skin: Bilateral lower extremities dry, excoriated.  90% granulated wound to left lateral shin.  Mild edema to bilateral lower extremities.  No erythema noted. Lab Results:  Basic Metabolic Panel:    Component Value Date/Time   NA 139 06/30/2022 0440   NA 140 10/02/2017 1126   K 4.2 06/30/2022 0440   CL 107 06/30/2022 0440   CO2 26 06/30/2022 0440   BUN 15 06/30/2022 0440   BUN 9 10/02/2017 1126   CREATININE 1.00 06/30/2022 0440   GLUCOSE 119 (H) 06/30/2022 0440   CALCIUM 8.7 (L) 06/30/2022 0440   CBC:    Component Value Date/Time   WBC 14.1 (H) 06/30/2022 0440   HGB 7.5 (L) 06/30/2022 0440   HGB 7.5 (L) 10/02/2017 1126   HCT 22.3 (L) 06/30/2022 0440   HCT 22.0 (L) 10/02/2017 1126   PLT 525 (H) 06/30/2022 0440   PLT 511 (H) 10/02/2017 1126   MCV 97.0 06/30/2022 0440   MCV 94 10/02/2017 1126   NEUTROABS 9.5 (H) 06/30/2022 0440   NEUTROABS 8.8 (H) 10/02/2017 1126   LYMPHSABS 2.8 06/30/2022 0440   LYMPHSABS 5.2 (H) 10/02/2017 1126   MONOABS 1.3 (H) 06/30/2022 0440   EOSABS 0.4 06/30/2022 0440   EOSABS 0.2 10/02/2017 1126   BASOSABS 0.0 06/30/2022 0440   BASOSABS 0.1 10/02/2017 1126    No results found for this or any previous visit (from the past 240 hour(s)).  Studies/Results: No results found.  Medications: Scheduled Meds:   Continuous Infusions: PRN Meds:.  Consultants: none  Procedures: none  Antibiotics: none  Assessment/Plan: Principal Problem:   Sickle cell pain crisis (HCC) Active Problems:   Anemia of chronic disease   Transaminitis   Thrombocytosis   Ulcers of both lower legs (HCC)  Sickle cell disease with pain crisis: Dilaudid 1 mg every 3 hours as needed Toradol 15 mg IV every 6 hours Oxycodone 10 mg every 4 hours as needed for severe breakthrough pain Physical therapy to evaluate. Difficulty ambulating. Monitor vital signs very  closely, reevaluate pain scale regularly, and supplemental oxygen as needed.  Multiple leg ulcers bilateral lower extremities: Patient has multiple granulated wounds to bilateral lower extremity.  Patient has been scratching his lower extremities, excoriation is due to constantly scratching.  Continue doxycycline 100 mg twice daily continue Benadryl 25 mg every 4 hours as needed for itching.  Daily wound dressings with bacitracin ointment top with dry gauze and covered with a silicone foam.  Leukocytosis: Stable.  Oral antibiotics.  Patient afebrile.  We will continue to monitor closely.  Labs in AM.  Thrombocytosis: Stable.  Secondary to sickle cell crisis.  Continue to monitor closely.   Code Status: Full Code Family Communication: N/A Disposition Plan: Not yet ready for discharge  [place], MSN, FNP-C Patient [facility] [address] [place], [place] [address] [phone]   If 7PM-7AM, please contact night-coverage.  07/07/2022, 5:07 PM  LOS: 8 days

## 2022-07-04 DIAGNOSIS — D5700 Hb-SS disease with crisis, unspecified: Secondary | ICD-10-CM | POA: Diagnosis not present

## 2022-07-04 NOTE — Discharge Summary (Incomplete)
Physician Discharge Summary  Jeffrey Morrison [id number] DOB: [birth year] DOA: 06/26/2022  PCP: Dorena Dew, FNP  Admit date: 06/26/2022  Discharge date: 07/04/2022  Discharge Diagnoses:  Principal Problem:   Sickle cell pain crisis ([place]) Active Problems:   Anemia of chronic disease   Transaminitis   Thrombocytosis   Ulcers of both lower legs ([place])   Discharge Condition: Stable  Disposition:  Pt is discharged home in good condition and is to follow up with Dorena Dew, FNP this week to have labs evaluated. Jeffrey Morrison is instructed to increase activity slowly and balance with rest for the next few days, and use prescribed medication to complete treatment of pain  Diet: Regular Wt Readings from Last 3 Encounters:  06/29/22 101.9 kg  04/28/21 77.1 kg  03/21/21 77.1 kg    History of present illness:  Jeffrey Morrison  is a 26 y.o. male with a medical history significant for sickle cell disease, history of anemia of chronic disease that presents to the emergency department with complaints of bilateral knee pain over the past 24 hours.  Patient is currently incarcerated and is accompanied by a Engineer, structural. Patient states that he was in his usual state of health until 24 hours prior when pain intensity increased and bilateral knees.  Patient has not identified any inciting factors concerning crisis.  He currently rates his pain as 10/10 despite IV morphine, IV fluids, and IV Toradol.  Patient states that prior to presenting to the emergency department he was administered ibuprofen, which was ineffective.  Patient currently denies any headache, chest pain, shortness of breath, dizziness, fever, or chills.  No nausea, vomiting, or diarrhea.  No sick contacts or known exposure to COVID-19.   ED course: While in the ER, patient's vital signs remained stable.BP 120/64 (BP Location: Right Arm)   Pulse 85   Temp 99.6 F (37.6 C) (Oral)   Resp 17   Ht '5\' 9"'$  (1.753 m)    Wt 99.7 kg   SpO2 93%   BMI 32.46 kg/m  Reviewed all laboratory values: CBC shows WBCs 22,500, hemoglobin 8.9 g/dL, and platelets 447,000.  Reticulocytes 11.2%.  Basic metabolic panel unremarkable.  Hepatic function panel shows AST 67, ALT 97, and total bilirubin 2.0.  Patient's pain is suboptimal despite IV morphine, IV fluids, and IV Toradol.  Patient will be admitted for sickle cell pain crisis  Hospital Course:  Sickle cell disease with pain crisis: Patient was admitted for sickle cell pain crisis and managed appropriately with IVF, IV Dilaudid via PCA and IV Toradol, as well as other adjunct therapies per sickle cell pain management protocols.  IV Dilaudid PCA was weaned appropriately and patient was transition to oxycodone 10 mg every 6 hours for severe breakthrough pain. Prior to discharge, patient's pain intensity was 6/10 primarily to lower extremities.  Patient to continue his folic acid and hydroxyurea.  Chronic ulcers to lower extremities: Throughout admission, doxycycline 100 mg twice daily was continue.  Bacitracin ointment placed.  Patient advised to keep areas clean and dry.  He will follow-up with clinic nurse.   Functional decline throughout admission.  PT evaluated.  Crutches provided.  No further PT warranted at discharge.   Patient was therefore discharged home today in a hemodynamically stable condition. Jeffrey Morrison was counseled extensively about nonpharmacologic means of pain management, patient verbalized understanding and was appreciative of  the care received during this admission.   We discussed the need for good hydration, monitoring of hydration status,  avoidance of heat, cold, stress, and infection triggers. We discussed the need to be adherent with taking Hydrea and other home medications. Patient was reminded of the need to seek medical attention immediately if any symptom of bleeding, anemia, or infection occurs.  Discharge Exam: Vitals:   07/04/22 0555 07/04/22  0854  BP: 105/63 112/63  Pulse: 78 70  Resp: 16 18  Temp: 99.5 F (37.5 C) 98.1 F (36.7 C)  SpO2: 99% 95%   Vitals:   07/03/22 2043 07/04/22 0207 07/04/22 0555 07/04/22 0854  BP: (!) 100/55 122/63 105/63 112/63  Pulse: 71 74 78 70  Resp: '16 16 16 18  '$ Temp: 99.2 F (37.3 C) 99 F (37.2 C) 99.5 F (37.5 C) 98.1 F (36.7 C)  TempSrc: Oral Oral Oral Oral  SpO2: 98% 94% 99% 95%  Weight:      Height:        General appearance : Awake, alert, not in any distress. Speech Clear. Not toxic looking HEENT: Atraumatic and Normocephalic, pupils equally reactive to light and accomodation Neck: Supple, no JVD. No cervical lymphadenopathy.  Chest: Good air entry bilaterally, no added sounds  CVS: S1 S2 regular, no murmurs.  Abdomen: Bowel sounds present, Non tender and not distended with no gaurding, rigidity or rebound. Extremities: B/L Lower Ext shows no edema, both legs are warm to touch Neurology: Awake alert, and oriented X 3, CN II-XII intact, Non focal Skin: No Rash  Discharge Instructions  Discharge Instructions     Discharge patient   Complete by: As directed    Discharge disposition: 01-Home or Self Care   Discharge patient date: 07/04/2022      Allergies as of 07/04/2022   No Known Allergies      Medication List     TAKE these medications    ARIPiprazole 5 MG tablet Commonly known as: ABILIFY Take 5 mg by mouth at bedtime.   busPIRone 10 MG tablet Commonly known as: BUSPAR Take 20 mg by mouth at bedtime.   cholecalciferol 25 MCG (1000 UNIT) tablet Commonly known as: VITAMIN D3 Take 3,000 Units by mouth daily.   docusate sodium 100 MG capsule Commonly known as: COLACE Take 100 mg by mouth 2 (two) times daily.   folic acid 1 MG tablet Commonly known as: FOLVITE Take 1 mg by mouth in the morning.   hydroxyurea 500 MG capsule Commonly known as: HYDREA Take 1 capsule (500 mg total) by mouth daily. May take with food to minimize GI side  effects. What changed:  when to take this additional instructions   mirtazapine 15 MG tablet Commonly known as: REMERON Take 15 mg by mouth at bedtime.   Oxycodone HCl 10 MG Tabs Take 1 tablet (10 mg total) by mouth every 6 (six) hours as needed for severe pain.   polyethylene glycol powder 17 GM/SCOOP powder Commonly known as: GLYCOLAX/MIRALAX Take 17 g by mouth daily as needed (constipation).   pseudoephedrine 30 MG tablet Commonly known as: SUDAFED Take 60 mg by mouth at bedtime.               Durable Medical Equipment  (From admission, onward)           Start     Ordered   07/03/22 1358  For home use only DME Crutches  Once        07/03/22 1358            The results of significant diagnostics from this hospitalization (including imaging, microbiology,  ancillary and laboratory) are listed below for reference.    Significant Diagnostic Studies: No results found.  Microbiology: No results found for this or any previous visit (from the past 240 hour(s)).   Labs: Basic Metabolic Panel: Recent Labs  Lab 06/30/22 0440  NA 139  K 4.2  CL 107  CO2 26  GLUCOSE 119*  BUN 15  CREATININE 1.00  CALCIUM 8.7*   Liver Function Tests: Recent Labs  Lab 06/30/22 0440  AST 32  ALT 51*  ALKPHOS 126  BILITOT 2.0*  PROT 7.2  ALBUMIN 3.2*   No results for input(s): "LIPASE", "AMYLASE" in the last 168 hours. No results for input(s): "AMMONIA" in the last 168 hours. CBC: Recent Labs  Lab 06/28/22 0531 06/30/22 0440  WBC 17.4* 14.1*  NEUTROABS  --  9.5*  HGB 8.7* 7.5*  HCT 26.0* 22.3*  MCV 97.7 97.0  PLT 457* 525*   Cardiac Enzymes: No results for input(s): "CKTOTAL", "CKMB", "CKMBINDEX", "TROPONINI" in the last 168 hours. BNP: Invalid input(s): "POCBNP" CBG: No results for input(s): "GLUCAP" in the last 168 hours.  Time coordinating discharge: 30 minutes  Signed:  Donia Pounds  APRN, MSN, FNP-C Patient [place] [address] [place], [place] [address] [phone]  [facility] 07/04/2022, 10:31 AM

## 2022-07-04 NOTE — TOC Progression Note (Signed)
Transition of Care [facility]) - Progression Note    Patient Details  Name: Jeffrey Morrison MRN: [id number] Date of Birth: 1996/08/17  Transition of [facility] CM/SW [place], RN Phone Number:[phone]  07/04/2022, 10:25 AM  Clinical Narrative:    CM received call from NP Lachina hollis to assist in determining process for ensuring that patient returning to [facility] jail will receive medications or therapies. CM spoke with officer at bedside who confirms that the jail has a NP  that will review discharge medications and ensure that patient has meds.  Patient will not be able to receive any type of physical therapy in jail. CM has updated Lachina. No other needs noted. CM verified that crutches are at the bedside.         Expected Discharge Plan and Services                                                 Social Determinants of Health (SDOH) Interventions    Readmission Risk Interventions    06/29/2022   11:49 AM  Readmission Risk Prevention Plan  Post Dischage Appt Complete  Medication Screening Complete  Transportation Screening Complete

## 2022-07-04 NOTE — Discharge Instructions (Addendum)
Patient continues to have chronic pain and will need to complete treatment with oxycodone 10 mg every 6 hours as needed for severe breakthrough pain.  He will need to resume hydroxyurea, ibuprofen, and folic acid. Patient will warrant 64 ounces of fluids over the next 6 to 7 days.  Patient was evaluated by physical therapy and will need to ambulate with crutches due to deconditioning.  Please reevaluate within 1 week.

## 2022-09-29 ENCOUNTER — Encounter (HOSPITAL_COMMUNITY): Payer: Self-pay

## 2022-09-29 ENCOUNTER — Emergency Department (HOSPITAL_COMMUNITY): Payer: Medicare Other

## 2022-09-29 ENCOUNTER — Other Ambulatory Visit: Payer: Self-pay

## 2022-09-29 ENCOUNTER — Inpatient Hospital Stay (HOSPITAL_COMMUNITY)
Admission: EM | Admit: 2022-09-29 | Discharge: 2022-10-06 | DRG: 812 | Payer: Medicare Other | Attending: Internal Medicine | Admitting: Internal Medicine

## 2022-09-29 DIAGNOSIS — F411 Generalized anxiety disorder: Secondary | ICD-10-CM | POA: Diagnosis not present

## 2022-09-29 DIAGNOSIS — D72829 Elevated white blood cell count, unspecified: Secondary | ICD-10-CM | POA: Diagnosis not present

## 2022-09-29 DIAGNOSIS — D5700 Hb-SS disease with crisis, unspecified: Secondary | ICD-10-CM | POA: Diagnosis not present

## 2022-09-29 DIAGNOSIS — L97829 Non-pressure chronic ulcer of other part of left lower leg with unspecified severity: Secondary | ICD-10-CM | POA: Diagnosis present

## 2022-09-29 DIAGNOSIS — Z79899 Other long term (current) drug therapy: Secondary | ICD-10-CM

## 2022-09-29 DIAGNOSIS — G894 Chronic pain syndrome: Secondary | ICD-10-CM | POA: Diagnosis present

## 2022-09-29 DIAGNOSIS — S81802A Unspecified open wound, left lower leg, initial encounter: Secondary | ICD-10-CM

## 2022-09-29 DIAGNOSIS — Z833 Family history of diabetes mellitus: Secondary | ICD-10-CM

## 2022-09-29 DIAGNOSIS — K5900 Constipation, unspecified: Secondary | ICD-10-CM | POA: Diagnosis present

## 2022-09-29 DIAGNOSIS — D638 Anemia in other chronic diseases classified elsewhere: Secondary | ICD-10-CM | POA: Diagnosis present

## 2022-09-29 DIAGNOSIS — F32A Depression, unspecified: Secondary | ICD-10-CM | POA: Diagnosis present

## 2022-09-29 DIAGNOSIS — Z87891 Personal history of nicotine dependence: Secondary | ICD-10-CM

## 2022-09-29 DIAGNOSIS — Z9081 Acquired absence of spleen: Secondary | ICD-10-CM

## 2022-09-29 LAB — RETICULOCYTES
Immature Retic Fract: 26.3 % — ABNORMAL HIGH (ref 2.3–15.9)
RBC.: 2.48 MIL/uL — ABNORMAL LOW (ref 4.22–5.81)
Retic Count, Absolute: 283.5 10*3/uL — ABNORMAL HIGH (ref 19.0–186.0)
Retic Ct Pct: 11.3 % — ABNORMAL HIGH (ref 0.4–3.1)

## 2022-09-29 LAB — CBC WITH DIFFERENTIAL/PLATELET
Abs Immature Granulocytes: 0.11 10*3/uL — ABNORMAL HIGH (ref 0.00–0.07)
Basophils Absolute: 0.1 10*3/uL (ref 0.0–0.1)
Basophils Relative: 0 %
Eosinophils Absolute: 0.1 10*3/uL (ref 0.0–0.5)
Eosinophils Relative: 1 %
HCT: 24.9 % — ABNORMAL LOW (ref 39.0–52.0)
Hemoglobin: 8 g/dL — ABNORMAL LOW (ref 13.0–17.0)
Immature Granulocytes: 1 %
Lymphocytes Relative: 15 %
Lymphs Abs: 2.5 10*3/uL (ref 0.7–4.0)
MCH: 32 pg (ref 26.0–34.0)
MCHC: 32.1 g/dL (ref 30.0–36.0)
MCV: 99.6 fL (ref 80.0–100.0)
Monocytes Absolute: 1.3 10*3/uL — ABNORMAL HIGH (ref 0.1–1.0)
Monocytes Relative: 8 %
Neutro Abs: 12.6 10*3/uL — ABNORMAL HIGH (ref 1.7–7.7)
Neutrophils Relative %: 75 %
Platelets: 474 10*3/uL — ABNORMAL HIGH (ref 150–400)
RBC: 2.5 MIL/uL — ABNORMAL LOW (ref 4.22–5.81)
RDW: 23.9 % — ABNORMAL HIGH (ref 11.5–15.5)
WBC: 16.6 10*3/uL — ABNORMAL HIGH (ref 4.0–10.5)
nRBC: 22 % — ABNORMAL HIGH (ref 0.0–0.2)

## 2022-09-29 LAB — COMPREHENSIVE METABOLIC PANEL
ALT: 30 U/L (ref 0–44)
AST: 29 U/L (ref 15–41)
Albumin: 4.2 g/dL (ref 3.5–5.0)
Alkaline Phosphatase: 77 U/L (ref 38–126)
Anion gap: 7 (ref 5–15)
BUN: 9 mg/dL (ref 6–20)
CO2: 26 mmol/L (ref 22–32)
Calcium: 9.4 mg/dL (ref 8.9–10.3)
Chloride: 105 mmol/L (ref 98–111)
Creatinine, Ser: 0.97 mg/dL (ref 0.61–1.24)
GFR, Estimated: >60 mL/min (ref >60)
Glucose, Bld: 92 mg/dL (ref 70–99)
Potassium: 4 mmol/L (ref 3.5–5.1)
Sodium: 138 mmol/L (ref 135–145)
Total Bilirubin: 2.8 mg/dL — ABNORMAL HIGH (ref 0.3–1.2)
Total Protein: 8 g/dL (ref 6.5–8.1)

## 2022-09-29 LAB — URINALYSIS, ROUTINE W REFLEX MICROSCOPIC
Bacteria, UA: NONE SEEN
Bilirubin Urine: NEGATIVE
Glucose, UA: NEGATIVE mg/dL
Hgb urine dipstick: NEGATIVE
Ketones, ur: NEGATIVE mg/dL
Nitrite: NEGATIVE
Protein, ur: NEGATIVE mg/dL
Specific Gravity, Urine: 1.012 (ref 1.005–1.030)
pH: 7 (ref 5.0–8.0)

## 2022-09-29 LAB — PROTIME-INR
INR: 1.2 (ref 0.8–1.2)
Prothrombin Time: 14.8 seconds (ref 11.4–15.2)

## 2022-09-29 LAB — TYPE AND SCREEN
ABO/RH(D): A POS
Antibody Screen: NEGATIVE

## 2022-09-29 LAB — MAGNESIUM: Magnesium: 2 mg/dL (ref 1.7–2.4)

## 2022-09-29 LAB — APTT: aPTT: 29 seconds (ref 24–36)

## 2022-09-29 MED ORDER — HYDROMORPHONE HCL 2 MG/ML IJ SOLN
2.0000 mg | INTRAMUSCULAR | Status: AC
  Administered 2022-09-29: 2 mg via INTRAVENOUS
  Filled 2022-09-29: qty 1

## 2022-09-29 MED ORDER — SENNOSIDES-DOCUSATE SODIUM 8.6-50 MG PO TABS
1.0000 | ORAL_TABLET | Freq: Two times a day (BID) | ORAL | Status: DC
  Administered 2022-09-30 – 2022-10-06 (×9): 1 via ORAL
  Filled 2022-09-29 (×13): qty 1

## 2022-09-29 MED ORDER — KETOROLAC TROMETHAMINE 15 MG/ML IJ SOLN
15.0000 mg | INTRAMUSCULAR | Status: AC
  Administered 2022-09-29: 15 mg via INTRAVENOUS
  Filled 2022-09-29: qty 1

## 2022-09-29 MED ORDER — DIPHENHYDRAMINE HCL 25 MG PO CAPS: 25.0000 mg | ORAL_CAPSULE | Freq: Four times a day (QID) | ORAL | Status: DC | PRN

## 2022-09-29 MED ORDER — SODIUM CHLORIDE 0.45 % IV SOLN: INTRAVENOUS | Status: DC

## 2022-09-29 MED ORDER — ONDANSETRON HCL 4 MG/2ML IJ SOLN
4.0000 mg | INTRAMUSCULAR | Status: DC | PRN
  Administered 2022-09-29: 4 mg via INTRAVENOUS
  Filled 2022-09-29: qty 2

## 2022-09-29 MED ORDER — HYDROMORPHONE 1 MG/ML IV SOLN: INTRAVENOUS | Status: DC

## 2022-09-29 MED ORDER — POLYETHYLENE GLYCOL 3350 17 G PO PACK
17.0000 g | PACK | Freq: Every day | ORAL | Status: DC | PRN
  Administered 2022-10-03: 17 g via ORAL
  Filled 2022-09-29: qty 1

## 2022-09-29 MED ORDER — DIPHENHYDRAMINE HCL 25 MG PO CAPS
25.0000 mg | ORAL_CAPSULE | ORAL | Status: DC | PRN
  Administered 2022-09-29 – 2022-09-30 (×2): 25 mg via ORAL
  Filled 2022-09-29 (×2): qty 1

## 2022-09-29 MED ORDER — NALOXONE HCL 0.4 MG/ML IJ SOLN: 0.4000 mg | INTRAMUSCULAR | Status: DC | PRN

## 2022-09-29 MED ORDER — KETOROLAC TROMETHAMINE 15 MG/ML IJ SOLN
15.0000 mg | Freq: Four times a day (QID) | INTRAMUSCULAR | Status: AC
  Administered 2022-09-29 – 2022-10-04 (×20): 15 mg via INTRAVENOUS
  Filled 2022-09-29 (×20): qty 1

## 2022-09-29 MED ORDER — HYDROMORPHONE HCL 1 MG/ML IJ SOLN
1.0000 mg | INTRAMUSCULAR | Status: DC | PRN
  Administered 2022-09-29 – 2022-09-30 (×5): 1 mg via INTRAVENOUS
  Filled 2022-09-29 (×5): qty 1

## 2022-09-29 MED ORDER — ENOXAPARIN SODIUM 40 MG/0.4ML IJ SOSY
40.0000 mg | PREFILLED_SYRINGE | INTRAMUSCULAR | Status: DC
  Administered 2022-09-30 – 2022-10-06 (×7): 40 mg via SUBCUTANEOUS
  Filled 2022-09-29 (×7): qty 0.4

## 2022-09-29 MED ORDER — SODIUM CHLORIDE 0.9 % IV SOLN
12.5000 mg | Freq: Once | INTRAVENOUS | Status: AC
  Administered 2022-09-29: 12.5 mg via INTRAVENOUS
  Filled 2022-09-29: qty 12.5

## 2022-09-29 MED ORDER — MELATONIN 3 MG PO TABS: 3.0000 mg | ORAL_TABLET | Freq: Every evening | ORAL | Status: DC | PRN

## 2022-09-29 NOTE — ED Provider Notes (Signed)
Care of patient assumed from Dr. Glynda Jaeger.  This patient presents with left leg pain consistent with prior sickle cell pain.  He is currently awaiting his third dose of Dilaudid. Physical Exam  BP 135/74   Pulse 79   Temp 98.5 F (36.9 C) (Oral)   Resp 10   Ht '5\' 9"'$  (1.753 m)   Wt 90.7 kg   SpO2 95%   BMI 29.53 kg/m   Physical Exam Vitals and nursing note reviewed.  Constitutional:      General: He is not in acute distress.    Appearance: Normal appearance. He is well-developed. He is not ill-appearing, toxic-appearing or diaphoretic.  HENT:     Head: Normocephalic and atraumatic.     Right Ear: External ear normal.     Left Ear: External ear normal.     Nose: Nose normal.     Mouth/Throat:     Mouth: Mucous membranes are moist.  Eyes:     Extraocular Movements: Extraocular movements intact.     Conjunctiva/sclera: Conjunctivae normal.  Cardiovascular:     Rate and Rhythm: Normal rate and regular rhythm.  Pulmonary:     Effort: Pulmonary effort is normal. No respiratory distress.  Abdominal:     General: There is no distension.     Palpations: Abdomen is soft.  Musculoskeletal:        General: Tenderness present. No swelling.     Cervical back: Normal range of motion and neck supple.     Right lower leg: No edema.     Left lower leg: No edema.  Skin:    General: Skin is warm and dry.     Capillary Refill: Capillary refill takes less than 2 seconds.     Coloration: Skin is not jaundiced or pale.  Neurological:     General: No focal deficit present.     Mental Status: He is alert and oriented to person, place, and time.     Cranial Nerves: No cranial nerve deficit.     Sensory: No sensory deficit.     Motor: No weakness.     Coordination: Coordination normal.  Psychiatric:        Mood and Affect: Mood normal.        Behavior: Behavior normal.        Thought Content: Thought content normal.        Judgment: Judgment normal.     Procedures  Procedures  ED  Course / MDM    Medical Decision Making Risk Prescription drug management.   ***

## 2022-09-29 NOTE — H&P (Signed)
History and Physical      Jeffrey Morrison [id number] DOB: 03/24/1996 DOA: 09/29/2022  PCP: Dorena Dew, FNP *** Patient coming from: home ***  I have personally briefly reviewed patient's old medical records in [place]  Chief Complaint: ***  HPI: Jeffrey Morrison is a 27 y.o. male with medical history significant for *** who is admitted to [facility] on 09/29/2022 with *** after presenting from home*** to [facility] ED complaining of ***.   ***        ***  ED Course:  Vital signs in the ED were notable for the following: ***  Labs were notable for the following: ***  Per my interpretation, EKG in ED demonstrated the following:  ***  Imaging and additional notable ED work-up: ***  While in the ED, the following were administered: ***  Subsequently, the patient was admitted  ***  ***red   Review of Systems: As per HPI otherwise 10 point review of systems negative.   Past Medical History:  Diagnosis Date   Chronic lower back pain    Depression    Sickle cell crisis ([place])    Sickle cell disease ([place])     Past Surgical History:  Procedure Laterality Date   SPLENECTOMY      Social History:  reports that he has quit smoking. His smoking use included cigarettes. He smoked an average of .5 packs per day. He has never used smokeless tobacco. He reports that he does not currently use drugs after having used the following drugs: IV and Methamphetamines. He reports that he does not drink alcohol.   No Known Allergies  Family History  Problem Relation Age of Onset   Diabetes Mother     Family history reviewed and not pertinent ***   Prior to Admission medications   Medication Sig Start Date End Date Taking? Authorizing Provider  ARIPiprazole (ABILIFY) 5 MG tablet Take 5 mg by mouth at bedtime.    [provider]  busPIRone (BUSPAR) 10 MG tablet Take 20 mg by mouth at bedtime.    [provider]  cholecalciferol (VITAMIN  D3) 25 MCG (1000 UNIT) tablet Take 3,000 Units by mouth daily.    [provider]  docusate sodium (COLACE) 100 MG capsule Take 100 mg by mouth 2 (two) times daily.    [provider]  folic acid (FOLVITE) 1 MG tablet Take 1 mg by mouth in the morning.    [provider]  hydroxyurea (HYDREA) 500 MG capsule Take 1 capsule (500 mg total) by mouth daily. May take with food to minimize GI side effects. Patient taking differently: Take 500 mg by mouth 2 (two) times daily. 02/20/21   Dorena Dew, FNP  mirtazapine (REMERON) 15 MG tablet Take 15 mg by mouth at bedtime.    [provider]  oxyCODONE 10 MG TABS Take 1 tablet (10 mg total) by mouth every 6 (six) hours as needed for severe pain. 07/04/22   Dorena Dew, FNP  polyethylene glycol powder (GLYCOLAX/MIRALAX) 17 GM/SCOOP powder Take 17 g by mouth daily as needed (constipation).    [provider]  pseudoephedrine (SUDAFED) 30 MG tablet Take 60 mg by mouth at bedtime.    [provider]     Objective    Physical Exam: Vitals:   09/29/22 1624 09/29/22 1724 09/29/22 1845 09/29/22 1930  BP:  135/74 128/81 122/70  Pulse: 66 79 65 67  Resp: '13 10 18 '$ 16  Temp:      TempSrc:      SpO2: 93% 95% 100% 96%  Weight:      Height:        General: appears to be stated age; alert, oriented Skin: warm, dry, no rash Head:  AT/[place] Mouth:  Oral mucosa membranes appear moist, normal dentition Neck: supple; trachea midline Heart:  RRR; did not appreciate any M/R/G Lungs: CTAB, did not appreciate any wheezes, rales, or rhonchi Abdomen: + BS; soft, ND, NT Vascular: 2+ pedal pulses b/l; 2+ radial pulses b/l Extremities: no peripheral edema, no muscle wasting Neuro: strength and sensation intact in upper and lower extremities b/l    *** Neuro: 5/5 strength of the proximal and distal flexors and extensors of the upper and lower extremities bilaterally; sensation intact in upper and lower  extremities b/l; cranial nerves II through XII grossly intact; no pronator drift; no evidence suggestive of slurred speech, dysarthria, or facial droop; Normal muscle tone. No tremors. *** Neuro: In the setting of the patient's current mental status and associated inability to follow instructions, unable to perform full neurologic exam at this time.  As such, assessment of strength, sensation, and cranial nerves is limited at this time. Patient noted to spontaneously move all 4 extremities. No tremors.  ***    Labs on Admission: I have personally reviewed following labs and imaging studies  CBC: Recent Labs  Lab 09/29/22 1445  WBC 16.6*  NEUTROABS PENDING  HGB 8.0*  HCT 24.9*  MCV 99.6  PLT 725*   Basic Metabolic Panel: Recent Labs  Lab 09/29/22 1445  NA 138  K 4.0  CL 105  CO2 26  GLUCOSE 92  BUN 9  CREATININE 0.97  CALCIUM 9.4   GFR: Estimated Creatinine Clearance: 126.2 mL/min (by C-G formula based on SCr of 0.97 mg/dL). Liver Function Tests: Recent Labs  Lab 09/29/22 1445  AST 29  ALT 30  ALKPHOS 77  BILITOT 2.8*  PROT 8.0  ALBUMIN 4.2   No results for input(s): "LIPASE", "AMYLASE" in the last 168 hours. No results for input(s): "AMMONIA" in the last 168 hours. Coagulation Profile: Recent Labs  Lab 09/29/22 1445  INR 1.2   Cardiac Enzymes: No results for input(s): "CKTOTAL", "CKMB", "CKMBINDEX", "TROPONINI" in the last 168 hours. BNP (last 3 results) No results for input(s): "PROBNP" in the last 8760 hours. HbA1C: No results for input(s): "HGBA1C" in the last 72 hours. CBG: No results for input(s): "GLUCAP" in the last 168 hours. Lipid Profile: No results for input(s): "CHOL", "HDL", "LDLCALC", "TRIG", "CHOLHDL", "LDLDIRECT" in the last 72 hours. Thyroid Function Tests: No results for input(s): "TSH", "T4TOTAL", "FREET4", "T3FREE", "THYROIDAB" in the last 72 hours. Anemia Panel: Recent Labs    09/29/22 1445  RETICCTPCT 11.3*   Urine  analysis:    Component Value Date/Time   COLORURINE YELLOW 09/29/2022 1334   APPEARANCEUR CLEAR 09/29/2022 1334   LABSPEC 1.012 09/29/2022 1334   PHURINE 7.0 09/29/2022 1334   GLUCOSEU NEGATIVE 09/29/2022 1334   HGBUR NEGATIVE 09/29/2022 1334   BILIRUBINUR NEGATIVE 09/29/2022 1334   KETONESUR NEGATIVE 09/29/2022 1334   PROTEINUR NEGATIVE 09/29/2022 1334   UROBILINOGEN 1.0 10/02/2017 1116   NITRITE NEGATIVE 09/29/2022 1334   LEUKOCYTESUR TRACE (A) 09/29/2022 1334    Radiological Exams on Admission: DG Hip Unilat W or Wo Pelvis 2-3 Views Left  Result Date: 09/29/2022 CLINICAL DATA:  Left leg pain sickle cell EXAM: DG HIP (WITH OR WITHOUT PELVIS) 2-3V LEFT COMPARISON:  CT 11/11/2018,  02/21/2017 FINDINGS: There is no evidence of hip fracture or dislocation. There is no evidence of arthropathy or other focal bone abnormality. IMPRESSION: Negative. Electronically Signed   By: Donavan Foil M.D.   On: 09/29/2022 19:04      Assessment/Plan    Principal Problem:   Sickle cell pain crisis (HCC)  ***      ***          ***           ***          ***          ***          ***          ***          ***          ***          ***     ***  DVT prophylaxis: SCD's ***  Code Status: Full code*** Family Communication: none*** Disposition Plan: Per Rounding Team Consults called: none***;  Admission status: ***    I SPENT GREATER THAN 75 *** MINUTES IN CLINICAL CARE TIME/MEDICAL DECISION-MAKING IN COMPLETING THIS ADMISSION.     [place] DO [facility] [place]   09/29/2022, 8:12 PM   ***

## 2022-09-29 NOTE — ED Provider Triage Note (Signed)
Emergency Medicine Provider Triage Evaluation Note  Jeffrey Morrison , a 27 y.o. male  was evaluated in triage.  Pt complains of sickle cell pain in left leg. Describes as in left groin wrapping around to left buttock and going down left leg. States this is his typical sickle cell pain. States last hospitalized for a crisis 2 months ago. Denies fever, chills, chest pain, SOB, nausea, vomiting, abdominal pain, dysuria, or hematuria. Gets wound care MWF for ulceration to left lower extremity. Takes ibuprofen daily for pain.   Review of Systems  Positive: See HPI Negative: See HPI  Physical Exam  BP 109/73 (BP Location: Right Arm)   Pulse 87   Temp 99.2 F (37.3 C) (Oral)   Resp 19   Ht '5\' 9"'$  (1.753 m)   Wt 90.7 kg   SpO2 100%   BMI 29.53 kg/m  Gen:   Awake, no distress  = Resp:  Normal effort LCTA MSK:   Moves extremities without difficulty, multiple bandaged ulceration to left lower leg, well-appearing, no active drainage, no surrounding erythema, no tenderness to LLE or swelling Other:  Abdomen soft and nontender  Medical Decision Making  Medically screening exam initiated at 1:39 PM.  Appropriate orders placed.  Jeffrey Morrison was informed that the remainder of the evaluation will be completed by another provider, this initial triage assessment does not replace that evaluation, and the importance of remaining in the ED until their evaluation is complete.     Turner Daniels 09/29/22 1341

## 2022-09-29 NOTE — ED Triage Notes (Signed)
Patient BIB [facility] from [facility]. Patient having a sickle cell pain crisis. Pain in his left groin that moves down to his left leg.

## 2022-09-29 NOTE — ED Provider Notes (Signed)
[facility]-EMERGENCY DEPT Provider Note   CSN: [id number] Arrival date & time: 09/29/22  1259     History  Chief Complaint  Patient presents with   Sickle Cell Pain Crisis    Jeffrey Morrison is a 27 y.o. male with past medical history of sickle cell disease, and anemia of chronic disease who presents from prison to the emergency department for left leg pain. The pain starts in his left buttock and radiates down to his foot. He has had multiple emergency department visits in 2023, total of 8.  3 of them required admissions to the hospital, last admission being in October 2023.  He takes hydroxyurea for management of his sickle cell disease. He states this episode is different due to radiation of pain all the way down his leg, vs stopping at his knee before. Per patient, he has required two blood transfusions in the past.    Sickle Cell Pain Crisis      Home Medications Prior to Admission medications   Medication Sig Start Date End Date Taking? Authorizing Provider  ARIPiprazole (ABILIFY) 5 MG tablet Take 5 mg by mouth at bedtime.    [provider]  busPIRone (BUSPAR) 10 MG tablet Take 20 mg by mouth at bedtime.    [provider]  cholecalciferol (VITAMIN D3) 25 MCG (1000 UNIT) tablet Take 3,000 Units by mouth daily.    [provider]  docusate sodium (COLACE) 100 MG capsule Take 100 mg by mouth 2 (two) times daily.    [provider]  folic acid (FOLVITE) 1 MG tablet Take 1 mg by mouth in the morning.    [provider]  hydroxyurea (HYDREA) 500 MG capsule Take 1 capsule (500 mg total) by mouth daily. May take with food to minimize GI side effects. Patient taking differently: Take 500 mg by mouth 2 (two) times daily. 02/20/21   Massie Maroon, FNP  mirtazapine (REMERON) 15 MG tablet Take 15 mg by mouth at bedtime.    [provider]  oxyCODONE 10 MG TABS Take 1 tablet (10 mg total) by mouth every 6 (six)  hours as needed for severe pain. 07/04/22   Massie Maroon, FNP  polyethylene glycol powder (GLYCOLAX/MIRALAX) 17 GM/SCOOP powder Take 17 g by mouth daily as needed (constipation).    [provider]  pseudoephedrine (SUDAFED) 30 MG tablet Take 60 mg by mouth at bedtime.    [provider]      Allergies    Patient has no known allergies.    Review of Systems   Review of Systems  Physical Exam Updated Vital Signs BP 109/73 (BP Location: Right Arm)   Pulse 87   Temp 99.2 F (37.3 C) (Oral)   Resp 19   Ht 5\' 9"  (1.753 m)   Wt 90.7 kg   SpO2 100%   BMI 29.53 kg/m  Physical Exam Cardio: Normal heart rate and rhythm  Lungs: Normal work of breathing on room air  MSK: Significant tenderness to palpation of LLE. Multiple small ulcers present on left lower extremity. ED Results / Procedures / Treatments   Labs (all labs ordered are listed, but only abnormal results are displayed) Labs Reviewed  COMPREHENSIVE METABOLIC PANEL  CBC WITH DIFFERENTIAL/PLATELET  URINALYSIS, ROUTINE W REFLEX MICROSCOPIC  APTT  PROTIME-INR  RETICULOCYTES  TYPE AND SCREEN    EKG None  Radiology No results found.    Medications Ordered in ED Medications  ondansetron (ZOFRAN) injection 4 mg (  has no administration in time range)  HYDROmorphone (DILAUDID) injection 2 mg (has no administration in time range)  HYDROmorphone (DILAUDID) injection 2 mg (has no administration in time range)  HYDROmorphone (DILAUDID) injection 2 mg (has no administration in time range)  diphenhydrAMINE (BENADRYL) 12.5 mg in sodium chloride 0.9 % 50 mL IVPB (has no administration in time range)  0.45 % sodium chloride infusion (has no administration in time range)    ED Course/ Medical Decision Making/ A&P                             Medical Decision Making Sickle Cell Crisis  Pt presents similar to previous hospitilizations and emergency department visits from prison in sickle cell crisis.  Currently getting lab work such as Reticulocytes, PT/INR, CBC, CMP to further evaluate. I do believe this is a sickle cell crisis, and low suspicion for DVT. Will treat with pain medication, fluids, and further navigate when labs return. Unsure exacerbating factor for this as he denies any recent fevers, shortness of breath, chest pain, or trauma. Further workup per Dr. Rush Landmark.   Risk Prescription drug management.    Final Clinical Impression(s) / ED Diagnoses Final diagnoses:  None  Sickle Cell Crisis      Olegario Messier, MD 09/29/22 1523    Tegeler, Canary Brim, MD 09/30/22 [phone]

## 2022-09-30 ENCOUNTER — Encounter (HOSPITAL_COMMUNITY): Payer: Self-pay | Admitting: Internal Medicine

## 2022-09-30 ENCOUNTER — Observation Stay (HOSPITAL_COMMUNITY): Payer: Medicare Other

## 2022-09-30 DIAGNOSIS — Z833 Family history of diabetes mellitus: Secondary | ICD-10-CM | POA: Diagnosis not present

## 2022-09-30 DIAGNOSIS — G894 Chronic pain syndrome: Secondary | ICD-10-CM | POA: Diagnosis present

## 2022-09-30 DIAGNOSIS — D5700 Hb-SS disease with crisis, unspecified: Secondary | ICD-10-CM

## 2022-09-30 DIAGNOSIS — D638 Anemia in other chronic diseases classified elsewhere: Secondary | ICD-10-CM | POA: Diagnosis present

## 2022-09-30 DIAGNOSIS — F32A Depression, unspecified: Secondary | ICD-10-CM | POA: Diagnosis present

## 2022-09-30 DIAGNOSIS — Z87891 Personal history of nicotine dependence: Secondary | ICD-10-CM | POA: Diagnosis not present

## 2022-09-30 DIAGNOSIS — L97829 Non-pressure chronic ulcer of other part of left lower leg with unspecified severity: Secondary | ICD-10-CM | POA: Diagnosis present

## 2022-09-30 DIAGNOSIS — Z79899 Other long term (current) drug therapy: Secondary | ICD-10-CM | POA: Diagnosis not present

## 2022-09-30 DIAGNOSIS — F411 Generalized anxiety disorder: Secondary | ICD-10-CM | POA: Diagnosis present

## 2022-09-30 DIAGNOSIS — Z9081 Acquired absence of spleen: Secondary | ICD-10-CM | POA: Diagnosis not present

## 2022-09-30 DIAGNOSIS — D72829 Elevated white blood cell count, unspecified: Secondary | ICD-10-CM | POA: Diagnosis present

## 2022-09-30 DIAGNOSIS — S81802A Unspecified open wound, left lower leg, initial encounter: Secondary | ICD-10-CM | POA: Diagnosis present

## 2022-09-30 DIAGNOSIS — K5900 Constipation, unspecified: Secondary | ICD-10-CM | POA: Diagnosis present

## 2022-09-30 LAB — COMPREHENSIVE METABOLIC PANEL
ALT: 27 U/L (ref 0–44)
AST: 27 U/L (ref 15–41)
Albumin: 3.6 g/dL (ref 3.5–5.0)
Alkaline Phosphatase: 69 U/L (ref 38–126)
Anion gap: 6 (ref 5–15)
BUN: 12 mg/dL (ref 6–20)
CO2: 27 mmol/L (ref 22–32)
Calcium: 8.7 mg/dL — ABNORMAL LOW (ref 8.9–10.3)
Chloride: 102 mmol/L (ref 98–111)
Creatinine, Ser: 1.24 mg/dL (ref 0.61–1.24)
GFR, Estimated: >60 mL/min (ref >60)
Glucose, Bld: 93 mg/dL (ref 70–99)
Potassium: 3.9 mmol/L (ref 3.5–5.1)
Sodium: 135 mmol/L (ref 135–145)
Total Bilirubin: 2.2 mg/dL — ABNORMAL HIGH (ref 0.3–1.2)
Total Protein: 6.7 g/dL (ref 6.5–8.1)

## 2022-09-30 LAB — CBC WITH DIFFERENTIAL/PLATELET
Abs Immature Granulocytes: 0.07 10*3/uL (ref 0.00–0.07)
Basophils Absolute: 0.1 10*3/uL (ref 0.0–0.1)
Basophils Relative: 0 %
Eosinophils Absolute: 0.3 10*3/uL (ref 0.0–0.5)
Eosinophils Relative: 2 %
HCT: 19.9 % — ABNORMAL LOW (ref 39.0–52.0)
Hemoglobin: 6.8 g/dL — CL (ref 13.0–17.0)
Immature Granulocytes: 1 %
Lymphocytes Relative: 37 %
Lymphs Abs: 5.3 10*3/uL — ABNORMAL HIGH (ref 0.7–4.0)
MCH: 32.7 pg (ref 26.0–34.0)
MCHC: 34.2 g/dL (ref 30.0–36.0)
MCV: 95.7 fL (ref 80.0–100.0)
Monocytes Absolute: 1.4 10*3/uL — ABNORMAL HIGH (ref 0.1–1.0)
Monocytes Relative: 10 %
Neutro Abs: 7.1 10*3/uL (ref 1.7–7.7)
Neutrophils Relative %: 50 %
Platelets: 402 10*3/uL — ABNORMAL HIGH (ref 150–400)
RBC: 2.08 MIL/uL — ABNORMAL LOW (ref 4.22–5.81)
RDW: 22.8 % — ABNORMAL HIGH (ref 11.5–15.5)
WBC: 14.2 10*3/uL — ABNORMAL HIGH (ref 4.0–10.5)
nRBC: 21.3 % — ABNORMAL HIGH (ref 0.0–0.2)

## 2022-09-30 LAB — HEMOGLOBIN AND HEMATOCRIT, BLOOD
HCT: 21.8 % — ABNORMAL LOW (ref 39.0–52.0)
Hemoglobin: 7.3 g/dL — ABNORMAL LOW (ref 13.0–17.0)

## 2022-09-30 LAB — MAGNESIUM: Magnesium: 1.9 mg/dL (ref 1.7–2.4)

## 2022-09-30 MED ORDER — DIPHENHYDRAMINE HCL 50 MG/ML IJ SOLN
12.5000 mg | Freq: Four times a day (QID) | INTRAMUSCULAR | Status: DC | PRN
  Filled 2022-09-30: qty 1

## 2022-09-30 MED ORDER — HYDROMORPHONE 1 MG/ML IV SOLN
INTRAVENOUS | Status: DC
  Administered 2022-09-30: 30 mg via INTRAVENOUS
  Administered 2022-09-30 – 2022-10-01 (×2): 2.7 mg via INTRAVENOUS
  Administered 2022-10-01: 5.1 mg via INTRAVENOUS
  Administered 2022-10-01: 1.5 mg via INTRAVENOUS
  Administered 2022-10-01: 3.6 mg via INTRAVENOUS
  Administered 2022-10-01: 2.7 mg via INTRAVENOUS
  Administered 2022-10-01: 2.1 mg via INTRAVENOUS
  Administered 2022-10-01: 3 mg via INTRAVENOUS
  Administered 2022-10-02: 0.9 mg via INTRAVENOUS
  Administered 2022-10-02: 1.8 mg via INTRAVENOUS
  Administered 2022-10-02 (×2): 1.5 mg via INTRAVENOUS
  Administered 2022-10-02: 3 mg via INTRAVENOUS
  Administered 2022-10-02: 30 mg via INTRAVENOUS
  Administered 2022-10-03: 2.4 mg via INTRAVENOUS
  Administered 2022-10-03: 1.5 mg via INTRAVENOUS
  Administered 2022-10-03: 2.1 mg via INTRAVENOUS
  Administered 2022-10-03: 3.3 mg via INTRAVENOUS
  Administered 2022-10-03: 0.6 mg via INTRAVENOUS
  Administered 2022-10-03: 2.7 mg via INTRAVENOUS
  Administered 2022-10-03: 0.3 mg via INTRAVENOUS
  Administered 2022-10-04: 30 mg via INTRAVENOUS
  Administered 2022-10-04: 3 mg via INTRAVENOUS
  Administered 2022-10-04: 2.1 mg via INTRAVENOUS
  Administered 2022-10-04: 2.4 mg via INTRAVENOUS
  Administered 2022-10-04: 1.2 mg via INTRAVENOUS
  Administered 2022-10-04: 0.6 mg via INTRAVENOUS
  Administered 2022-10-04: 2.7 mg via INTRAVENOUS
  Administered 2022-10-05 (×3): 1.8 mg via INTRAVENOUS
  Administered 2022-10-05 (×2): 3 mg via INTRAVENOUS
  Administered 2022-10-05: 2.1 mg via INTRAVENOUS
  Administered 2022-10-06: 1.8 mg via INTRAVENOUS
  Administered 2022-10-06: 1.5 mg via INTRAVENOUS
  Administered 2022-10-06: 3 mg via INTRAVENOUS
  Filled 2022-09-30 (×3): qty 30

## 2022-09-30 MED ORDER — BACITRACIN ZINC 500 UNIT/GM EX OINT
TOPICAL_OINTMENT | Freq: Every day | CUTANEOUS | Status: DC
  Administered 2022-10-01 – 2022-10-05 (×4): 15.7778 via TOPICAL
  Filled 2022-09-30: qty 14.2

## 2022-09-30 MED ORDER — HYDROXYUREA 500 MG PO CAPS
500.0000 mg | ORAL_CAPSULE | Freq: Every day | ORAL | Status: DC
  Administered 2022-09-30 – 2022-10-06 (×7): 500 mg via ORAL
  Filled 2022-09-30 (×7): qty 1

## 2022-09-30 MED ORDER — OXYCODONE HCL 5 MG PO TABS
5.0000 mg | ORAL_TABLET | ORAL | Status: DC | PRN
  Administered 2022-09-30 – 2022-10-01 (×3): 5 mg via ORAL
  Filled 2022-09-30 (×3): qty 1

## 2022-09-30 MED ORDER — DIPHENHYDRAMINE HCL 12.5 MG/5ML PO ELIX
12.5000 mg | ORAL_SOLUTION | Freq: Four times a day (QID) | ORAL | Status: DC | PRN
  Administered 2022-10-01: 12.5 mg via ORAL
  Filled 2022-09-30: qty 5

## 2022-09-30 MED ORDER — HYDROMORPHONE HCL 2 MG/ML IJ SOLN
2.0000 mg | INTRAMUSCULAR | Status: DC | PRN
  Administered 2022-09-30 (×2): 2 mg via INTRAVENOUS
  Filled 2022-09-30 (×2): qty 1

## 2022-09-30 MED ORDER — NALOXONE HCL 0.4 MG/ML IJ SOLN: 0.4000 mg | INTRAMUSCULAR | Status: DC | PRN

## 2022-09-30 MED ORDER — SODIUM CHLORIDE 0.9% FLUSH: 9.0000 mL | INTRAVENOUS | Status: DC | PRN

## 2022-09-30 MED ORDER — BUSPIRONE HCL 5 MG PO TABS
20.0000 mg | ORAL_TABLET | Freq: Every day | ORAL | Status: DC
  Administered 2022-09-30 – 2022-10-05 (×6): 20 mg via ORAL
  Filled 2022-09-30 (×6): qty 4

## 2022-09-30 NOTE — Consult Note (Signed)
[facility] Nurse Consult Note: Reason for Consult:LLE Sickle Cell related lesions, full thickness.  Patient known to me from previous consultations. Wound type:autoimmune, full thickness Pressure Injury POA: N/A Measurement: Several lesions to LLE, medial and lateral aspects (~7-10 circular lesions in varying stages to healing), the largest of which measures 2cm x 1.5cm x 0.2cm Wound SEL:TRVU, moist Drainage (amount, consistency, odor) none Periwound: intact with evidence of previous wound healing, scarring.Edema.  Dressing procedure/placement/frequency: Previous therapies have been effective, so I will provide guidance for Nursing using a daily soap and water cleanse, rinse and dry followed by application of bacitracin ointment topped with dry gauze and secured with silicone foam dressings. In the absence of silicone foam dressings, gauze and paper tape may be used.  [facility] nursing team will not follow, but will remain available to this patient, the nursing and medical teams.  Please re-consult if needed.  Thank you for inviting [place] to participate in this patient's Plan of Care.  Maudie Flakes, MSN, RN, CNS, [place], Serita Grammes, [employer], [employer] phone:  [phone]

## 2022-09-30 NOTE — ED Notes (Signed)
Given 2 hot packs for posterior L hip per request

## 2022-09-30 NOTE — Progress Notes (Signed)
Subjective: Jeffrey Morrison is a 27 year old male with a medical history significant for sickle cell disease was admitted for sickle cell pain crisis. Patient says that pain intensity is elevated despite IV Dilaudid.  Pain is mostly to groin, upper and lower extremities.  Pain intensity is 9/10.  Patient denies headache, chest pain, urinary symptoms, nausea, vomiting, or diarrhea.  Objective:  Vital signs in last 24 hours:  Vitals:   09/30/22 1200 09/30/22 1216 09/30/22 1230 09/30/22 1256  BP: 127/82   126/72  Pulse: 61  66 70  Resp: '12  14 20  '$ Temp:  97.6 F (36.4 C)  97.6 F (36.4 C)  TempSrc:  Oral  Oral  SpO2: 100%  98% 98%  Weight:      Height:        Intake/Output from previous day:   Intake/Output Summary (Last 24 hours) at 09/30/2022 1418 Last data filed at 09/30/2022 0744 Gross per 24 hour  Intake 50 ml  Output 850 ml  Net -800 ml    Physical Exam: General: Alert, awake, oriented x3, in no acute distress.  HEENT: [place]/AT PEERL, EOMI Neck: Trachea midline,  no masses, no thyromegal,y no JVD, no carotid bruit OROPHARYNX:  Moist, No exudate/ erythema/lesions.  Heart: Regular rate and rhythm, without murmurs, rubs, gallops, PMI non-displaced, no heaves or thrills on palpation.  Lungs: Clear to auscultation, no wheezing or rhonchi noted. No increased vocal fremitus resonant to percussion  Abdomen: Soft, nontender, nondistended, positive bowel sounds, no masses no hepatosplenomegaly noted..  Neuro: No focal neurological deficits noted cranial nerves II through XII grossly intact. DTRs 2+ bilaterally upper and lower extremities. Strength 5 out of 5 in bilateral upper and lower extremities. Musculoskeletal: No warm swelling or erythema around joints, no spinal tenderness noted. Psychiatric: Patient alert and oriented x3, good insight and cognition, good recent to remote recall. Lymph node survey: No cervical axillary or inguinal lymphadenopathy noted.  Lab Results:  Basic  Metabolic Panel:    Component Value Date/Time   NA 135 09/30/2022 0401   NA 140 10/02/2017 1126   K 3.9 09/30/2022 0401   CL 102 09/30/2022 0401   CO2 27 09/30/2022 0401   BUN 12 09/30/2022 0401   BUN 9 10/02/2017 1126   CREATININE 1.24 09/30/2022 0401   GLUCOSE 93 09/30/2022 0401   CALCIUM 8.7 (L) 09/30/2022 0401   CBC:    Component Value Date/Time   WBC 14.2 (H) 09/30/2022 0401   HGB 7.3 (L) 09/30/2022 0743   HGB 7.5 (L) 10/02/2017 1126   HCT 21.8 (L) 09/30/2022 0743   HCT 22.0 (L) 10/02/2017 1126   PLT 402 (H) 09/30/2022 0401   PLT 511 (H) 10/02/2017 1126   MCV 95.7 09/30/2022 0401   MCV 94 10/02/2017 1126   NEUTROABS 7.1 09/30/2022 0401   NEUTROABS 8.8 (H) 10/02/2017 1126   LYMPHSABS 5.3 (H) 09/30/2022 0401   LYMPHSABS 5.2 (H) 10/02/2017 1126   MONOABS 1.4 (H) 09/30/2022 0401   EOSABS 0.3 09/30/2022 0401   EOSABS 0.2 10/02/2017 1126   BASOSABS 0.1 09/30/2022 0401   BASOSABS 0.1 10/02/2017 1126    No results found for this or any previous visit (from the past 240 hour(s)).  Studies/Results: DG Chest Port 1 View  Result Date: 09/30/2022 CLINICAL DATA:  Sickle cell crisis, pain. EXAM: PORTABLE CHEST 1 VIEW COMPARISON:  Chest x-ray dated 02/19/2021. FINDINGS: Borderline cardiomegaly. Lungs are clear. No pleural effusion or pneumothorax is seen. No acute-appearing osseous abnormality. IMPRESSION: 1. No active disease.  No evidence of pneumonia or pulmonary edema. 2. Borderline cardiomegaly. Electronically Signed   By: Franki Cabot M.D.   On: 09/30/2022 08:42   DG Hip Unilat W or Wo Pelvis 2-3 Views Left  Result Date: 09/29/2022 CLINICAL DATA:  Left leg pain sickle cell EXAM: DG HIP (WITH OR WITHOUT PELVIS) 2-3V LEFT COMPARISON:  CT 11/11/2018, 02/21/2017 FINDINGS: There is no evidence of hip fracture or dislocation. There is no evidence of arthropathy or other focal bone abnormality. IMPRESSION: Negative. Electronically Signed   By: Donavan Foil M.D.   On: 09/29/2022  19:04    Medications: Scheduled Meds:  bacitracin   Topical Daily   busPIRone  20 mg Oral QHS   enoxaparin (LOVENOX) injection  40 mg Subcutaneous Q24H   HYDROmorphone   Intravenous Q4H   hydroxyurea  500 mg Oral BID   ketorolac  15 mg Intravenous Q6H   senna-docusate  1 tablet Oral BID   Continuous Infusions:  sodium chloride 10 mL/hr at 09/30/22 1316   PRN Meds:.diphenhydrAMINE **OR** diphenhydrAMINE, naloxone **AND** sodium chloride flush, ondansetron, oxyCODONE, polyethylene glycol  Consultants: none  Procedures: none  Antibiotics: none  Assessment/Plan: Principal Problem:   Sickle cell pain crisis ([place]) Active Problems:   Leukocytosis   Non-healing wound of left lower extremity   GAD (generalized anxiety disorder)  Sickle cell disease with pain crisis: Patient is opiate nave, will initiate full dose IV Dilaudid PCA Oxycodone 5 mg every 4 hours as needed for severe breakthrough pain Decrease IV fluids to KVO Toradol 15 mg IV every 6 hours Monitor vital signs very closely, reevaluate pain scale regularly, and supplemental oxygen as needed. Continue folic acid and hydroxyurea  Anemia of chronic disease: Hemoglobin remains slightly below patient's baseline at 7.3 g/dL.  No transfusion at this time.  Possible hemodilution, decrease IV fluids to KVO.  Follow labs in AM.    Leukocytosis: WBCs improving.  Continue to monitor closely without antibiotics.  Patient is afebrile without signs of acute infection.  Labs in AM.    Code Status: Full Code Family Communication: N/A Disposition Plan: Not yet ready for discharge  [place], MSN, FNP-C Patient [place], [place] [address] [phone]  If 7PM-7AM, please contact night-coverage.  09/30/2022, 2:18 PM  LOS: 0 days

## 2022-09-30 NOTE — ED Notes (Signed)
Alert, NAD, calm, watching TV, LEO x2 at [facility], VSS, endorses pain, denies nausea, itching, constipation, CP, sob.

## 2022-09-30 NOTE — ED Notes (Signed)
Xray at BS 

## 2022-10-01 ENCOUNTER — Inpatient Hospital Stay (HOSPITAL_COMMUNITY): Payer: Medicare Other

## 2022-10-01 DIAGNOSIS — D5700 Hb-SS disease with crisis, unspecified: Secondary | ICD-10-CM | POA: Diagnosis not present

## 2022-10-01 LAB — COMPREHENSIVE METABOLIC PANEL
ALT: 31 U/L (ref 0–44)
AST: 34 U/L (ref 15–41)
Albumin: 4.1 g/dL (ref 3.5–5.0)
Alkaline Phosphatase: 85 U/L (ref 38–126)
Anion gap: 8 (ref 5–15)
BUN: 20 mg/dL (ref 6–20)
CO2: 26 mmol/L (ref 22–32)
Calcium: 8.9 mg/dL (ref 8.9–10.3)
Chloride: 102 mmol/L (ref 98–111)
Creatinine, Ser: 1.22 mg/dL (ref 0.61–1.24)
GFR, Estimated: >60 mL/min (ref >60)
Glucose, Bld: 97 mg/dL (ref 70–99)
Potassium: 4.4 mmol/L (ref 3.5–5.1)
Sodium: 136 mmol/L (ref 135–145)
Total Bilirubin: 2.6 mg/dL — ABNORMAL HIGH (ref 0.3–1.2)
Total Protein: 7.7 g/dL (ref 6.5–8.1)

## 2022-10-01 LAB — CBC WITH DIFFERENTIAL/PLATELET
Abs Immature Granulocytes: 0.12 10*3/uL — ABNORMAL HIGH (ref 0.00–0.07)
Basophils Absolute: 0 10*3/uL (ref 0.0–0.1)
Basophils Relative: 0 %
Eosinophils Absolute: 0.6 10*3/uL — ABNORMAL HIGH (ref 0.0–0.5)
Eosinophils Relative: 3 %
HCT: 20.5 % — ABNORMAL LOW (ref 39.0–52.0)
Hemoglobin: 7 g/dL — ABNORMAL LOW (ref 13.0–17.0)
Immature Granulocytes: 1 %
Lymphocytes Relative: 25 %
Lymphs Abs: 4.2 10*3/uL — ABNORMAL HIGH (ref 0.7–4.0)
MCH: 31.7 pg (ref 26.0–34.0)
MCHC: 34.1 g/dL (ref 30.0–36.0)
MCV: 92.8 fL (ref 80.0–100.0)
Monocytes Absolute: 1.8 10*3/uL — ABNORMAL HIGH (ref 0.1–1.0)
Monocytes Relative: 10 %
Neutro Abs: 10.4 10*3/uL — ABNORMAL HIGH (ref 1.7–7.7)
Neutrophils Relative %: 61 %
Platelets: 380 10*3/uL (ref 150–400)
RBC: 2.21 MIL/uL — ABNORMAL LOW (ref 4.22–5.81)
RDW: 24 % — ABNORMAL HIGH (ref 11.5–15.5)
WBC: 17.1 10*3/uL — ABNORMAL HIGH (ref 4.0–10.5)
nRBC: 14.4 % — ABNORMAL HIGH (ref 0.0–0.2)

## 2022-10-01 LAB — BRAIN NATRIURETIC PEPTIDE: B Natriuretic Peptide: 41.5 pg/mL (ref 0.0–100.0)

## 2022-10-01 LAB — LACTATE DEHYDROGENASE: LDH: 237 U/L — ABNORMAL HIGH (ref 98–192)

## 2022-10-01 MED ORDER — HYDROXYZINE HCL 25 MG PO TABS
50.0000 mg | ORAL_TABLET | Freq: Three times a day (TID) | ORAL | Status: DC | PRN
  Administered 2022-10-01 – 2022-10-04 (×2): 50 mg via ORAL
  Filled 2022-10-01 (×2): qty 2

## 2022-10-01 MED ORDER — IOHEXOL 9 MG/ML PO SOLN
ORAL | Status: AC
  Administered 2022-10-01: 500 mL
  Filled 2022-10-01: qty 1000

## 2022-10-01 MED ORDER — DOXYCYCLINE HYCLATE 100 MG PO TABS
100.0000 mg | ORAL_TABLET | Freq: Two times a day (BID) | ORAL | Status: DC
  Administered 2022-10-01 – 2022-10-06 (×11): 100 mg via ORAL
  Filled 2022-10-01 (×11): qty 1

## 2022-10-01 MED ORDER — IOHEXOL 9 MG/ML PO SOLN
500.0000 mL | ORAL | Status: AC
  Administered 2022-10-01: 500 mL via ORAL

## 2022-10-01 MED ORDER — ACETAMINOPHEN 500 MG PO TABS: 500.0000 mg | ORAL_TABLET | Freq: Four times a day (QID) | ORAL | Status: DC | PRN

## 2022-10-01 MED ORDER — OXYCODONE HCL 5 MG PO TABS
5.0000 mg | ORAL_TABLET | ORAL | Status: DC
  Administered 2022-10-01 – 2022-10-06 (×26): 5 mg via ORAL
  Filled 2022-10-01 (×26): qty 1

## 2022-10-01 NOTE — Plan of Care (Signed)
Patient AOX4, VSS throughout shift.  Pt c/o pain partly relieved by PCA and warm blankets.  Diminished lungs, IS taught and encouraged.  Pt used urinal.  Bilateral cuffs remain in place, no skin break down.  Two security guards remain at bedside.  POC maintained, will continue to monitor.  Problem: Education: Goal: Knowledge of vaso-occlusive preventative measures will improve Outcome: Progressing Goal: Awareness of infection prevention will improve Outcome: Progressing Goal: Awareness of signs and symptoms of anemia will improve Outcome: Progressing Goal: Long-term complications will improve Outcome: Progressing   Problem: Self-Care: Goal: Ability to incorporate actions that prevent/reduce pain crisis will improve Outcome: Progressing   Problem: Bowel/Gastric: Goal: Gut motility will be maintained Outcome: Progressing   Problem: Tissue Perfusion: Goal: Complications related to inadequate tissue perfusion will be avoided or minimized Outcome: Progressing   Problem: Respiratory: Goal: Pulmonary complications will be avoided or minimized Outcome: Progressing Goal: Acute Chest Syndrome will be identified early to prevent complications Outcome: Progressing   Problem: Fluid Volume: Goal: Ability to maintain a balanced intake and output will improve Outcome: Progressing   Problem: Sensory: Goal: Pain level will decrease with appropriate interventions Outcome: Progressing   Problem: Health Behavior: Goal: Postive changes in compliance with treatment and prescription regimens will improve Outcome: Progressing   Problem: Education: Goal: Knowledge of General Education information will improve Description: Including pain rating scale, medication(s)/side effects and non-pharmacologic comfort measures Outcome: Progressing   Problem: Health Behavior/Discharge Planning: Goal: Ability to manage health-related needs will improve Outcome: Progressing   Problem: Clinical  Measurements: Goal: Ability to maintain clinical measurements within normal limits will improve Outcome: Progressing Goal: Will remain free from infection Outcome: Progressing Goal: Diagnostic test results will improve Outcome: Progressing Goal: Respiratory complications will improve Outcome: Progressing Goal: Cardiovascular complication will be avoided Outcome: Progressing   Problem: Activity: Goal: Risk for activity intolerance will decrease Outcome: Progressing   Problem: Nutrition: Goal: Adequate nutrition will be maintained Outcome: Progressing   Problem: Coping: Goal: Level of anxiety will decrease Outcome: Progressing   Problem: Elimination: Goal: Will not experience complications related to bowel motility Outcome: Progressing Goal: Will not experience complications related to urinary retention Outcome: Progressing   Problem: Pain Managment: Goal: General experience of comfort will improve Outcome: Progressing   Problem: Safety: Goal: Ability to remain free from injury will improve Outcome: Progressing   Problem: Skin Integrity: Goal: Risk for impaired skin integrity will decrease Outcome: Progressing

## 2022-10-01 NOTE — Progress Notes (Signed)
Patient complaint of significant left hip pain. States he is not getting any relief from current meds. PRN pain meds given, warm blanket given. Patient encouraged to get up out of bed to chair.

## 2022-10-01 NOTE — Progress Notes (Signed)
Subjective: Jeffrey Morrison is a 27 year old male with a medical history significant for sickle cell disease was admitted for sickle cell pain crisis. Patient says that pain intensity is elevated despite IV Dilaudid.  Pain is mostly to groin, upper and lower extremities.  Patient is also complaining of draining lower extremity ulcers.  Patient has had ulcers that are in various stages of healing over the past several months.  He is currently incarcerated and states that ulcers have not been treated or wrapped since previous hospitalization.  Pain intensity is 9/10.  Patient denies headache, chest pain, urinary symptoms, nausea, vomiting, or diarrhea.  Objective:  Vital signs in last 24 hours:  Vitals:   10/01/22 0104 10/01/22 0435 10/01/22 0837 10/01/22 1015  BP: (!) 104/56 127/72  127/71  Pulse: 87 89  83  Resp: '16 16 18 20  '$ Temp: 98.4 F (36.9 C) 99.4 F (37.4 C)  98.2 F (36.8 C)  TempSrc: Oral Oral  Oral  SpO2: 90% 90% 94% 92%  Weight:      Height:        Intake/Output from previous day:   Intake/Output Summary (Last 24 hours) at 10/01/2022 1247 Last data filed at 10/01/2022 1022 Gross per 24 hour  Intake 2748.67 ml  Output 600 ml  Net 2148.67 ml    Physical Exam: General: Alert, awake, oriented x3, in no acute distress.  HEENT: [place]/AT PEERL, EOMI Neck: Trachea midline,  no masses, no thyromegal,y no JVD, no carotid bruit OROPHARYNX:  Moist, No exudate/ erythema/lesions.  Heart: Regular rate and rhythm, without murmurs, rubs, gallops, PMI non-displaced, no heaves or thrills on palpation.  Lungs: Clear to auscultation, no wheezing or rhonchi noted. No increased vocal fremitus resonant to percussion  Abdomen: Soft, nontender, nondistended, positive bowel sounds, no masses no hepatosplenomegaly noted..  Neuro: No focal neurological deficits noted cranial nerves II through XII grossly intact. DTRs 2+ bilaterally upper and lower extremities. Strength 5 out of 5 in bilateral upper  and lower extremities. Musculoskeletal: No warm swelling or erythema around joints, no spinal tenderness noted. Psychiatric: Patient alert and oriented x3, good insight and cognition, good recent to remote recall. Lymph node survey: Fullness to left inguinal node, painful to touch Skin: Left shin, exterior, shallow,red base, minimal purulent drainage (yellow), and dime sized, uneven borders, minimal edema to bilateral lower extremity.  Painful to touch.  Lab Results:  Basic Metabolic Panel:    Component Value Date/Time   NA 135 09/30/2022 0401   NA 140 10/02/2017 1126   K 3.9 09/30/2022 0401   CL 102 09/30/2022 0401   CO2 27 09/30/2022 0401   BUN 12 09/30/2022 0401   BUN 9 10/02/2017 1126   CREATININE 1.24 09/30/2022 0401   GLUCOSE 93 09/30/2022 0401   CALCIUM 8.7 (L) 09/30/2022 0401   CBC:    Component Value Date/Time   WBC 14.2 (H) 09/30/2022 0401   HGB 7.3 (L) 09/30/2022 0743   HGB 7.5 (L) 10/02/2017 1126   HCT 21.8 (L) 09/30/2022 0743   HCT 22.0 (L) 10/02/2017 1126   PLT 402 (H) 09/30/2022 0401   PLT 511 (H) 10/02/2017 1126   MCV 95.7 09/30/2022 0401   MCV 94 10/02/2017 1126   NEUTROABS 7.1 09/30/2022 0401   NEUTROABS 8.8 (H) 10/02/2017 1126   LYMPHSABS 5.3 (H) 09/30/2022 0401   LYMPHSABS 5.2 (H) 10/02/2017 1126   MONOABS 1.4 (H) 09/30/2022 0401   EOSABS 0.3 09/30/2022 0401   EOSABS 0.2 10/02/2017 1126   BASOSABS 0.1 09/30/2022  0401   BASOSABS 0.1 10/02/2017 1126    No results found for this or any previous visit (from the past 240 hour(s)).  Studies/Results: DG Chest Port 1 View  Result Date: 09/30/2022 CLINICAL DATA:  Sickle cell crisis, pain. EXAM: PORTABLE CHEST 1 VIEW COMPARISON:  Chest x-ray dated 02/19/2021. FINDINGS: Borderline cardiomegaly. Lungs are clear. No pleural effusion or pneumothorax is seen. No acute-appearing osseous abnormality. IMPRESSION: 1. No active disease. No evidence of pneumonia or pulmonary edema. 2. Borderline cardiomegaly.  Electronically Signed   By: Franki Cabot M.D.   On: 09/30/2022 08:42   DG Hip Unilat W or Wo Pelvis 2-3 Views Left  Result Date: 09/29/2022 CLINICAL DATA:  Left leg pain sickle cell EXAM: DG HIP (WITH OR WITHOUT PELVIS) 2-3V LEFT COMPARISON:  CT 11/11/2018, 02/21/2017 FINDINGS: There is no evidence of hip fracture or dislocation. There is no evidence of arthropathy or other focal bone abnormality. IMPRESSION: Negative. Electronically Signed   By: Donavan Foil M.D.   On: 09/29/2022 19:04    Medications: Scheduled Meds:  bacitracin   Topical Daily   busPIRone  20 mg Oral QHS   doxycycline  100 mg Oral Q12H   enoxaparin (LOVENOX) injection  40 mg Subcutaneous Q24H   HYDROmorphone   Intravenous Q4H   hydroxyurea  500 mg Oral Daily   ketorolac  15 mg Intravenous Q6H   oxyCODONE  5 mg Oral Q4H while awake   senna-docusate  1 tablet Oral BID   Continuous Infusions:  sodium chloride 10 mL/hr at 09/30/22 2000   PRN Meds:.acetaminophen, diphenhydrAMINE **OR** diphenhydrAMINE, naloxone **AND** sodium chloride flush, ondansetron, polyethylene glycol  Consultants: none  Procedures: none  Antibiotics: none  Assessment/Plan: Principal Problem:   Sickle cell pain crisis ([place]) Active Problems:   Leukocytosis   Non-healing wound of left lower extremity   GAD (generalized anxiety disorder)  Sickle cell disease with pain crisis: Patient is opiate nave, will initiate full dose IV Dilaudid PCA Oxycodone 5 mg every 4 hours as needed for severe breakthrough pain Decrease IV fluids to KVO Toradol 15 mg IV every 6 hours Monitor vital signs very closely, reevaluate pain scale regularly, and supplemental oxygen as needed. Continue folic acid and hydroxyurea  Anemia of chronic disease: Hemoglobin remains slightly below patient's baseline at 7.3 g/dL.  No transfusion at this time.  Possible hemodilution, decrease IV fluids to KVO.  Follow labs in AM.    Leukocytosis: WBCs improving.   Continue to monitor closely without antibiotics.  Patient is afebrile without signs of acute infection.  Labs in AM.  Left lower extremity ulcer:  Left shin, exterior, shallow,red base, minimal purulent drainage (yellow), and dime sized, uneven borders, minimal edema to bilateral lower extremity.  Painful to touch. Wound care consult Initiate doxycycline 100 mg twice daily  Left groin pain: Reports worsening left groin pain over the past several days.  Patient afebrile.  No urinary frequency, urgency, or retention.  No penile discharge.  LLQ discomfort.  Review CT of abdomen/pelvis as results become available.     Code Status: Full Code Family Communication: N/A Disposition Plan: Not yet ready for discharge  [place], MSN, FNP-C Patient [facility] [address] [place], [place] [address] [phone]  If 7PM-7AM, please contact night-coverage.  10/01/2022, 12:47 PM  LOS: 1 day

## 2022-10-02 DIAGNOSIS — D5700 Hb-SS disease with crisis, unspecified: Secondary | ICD-10-CM | POA: Diagnosis not present

## 2022-10-02 MED ORDER — LACTULOSE 10 GM/15ML PO SOLN
20.0000 g | Freq: Two times a day (BID) | ORAL | Status: AC
  Administered 2022-10-02 – 2022-10-03 (×2): 20 g via ORAL
  Filled 2022-10-02 (×2): qty 30

## 2022-10-02 NOTE — Consult Note (Signed)
WOC Nurse Consult Note: Consultation received for LLE wounds. Patient seen by this writer on Tuesday, 09/30/22. Orders are current and active.  [place] nursing team will not follow, but will remain available to this patient, the nursing and medical teams.  Please re-consult if needed.  Thank you for inviting [place] to participate in this patient's Plan of Care.  Maudie Flakes, MSN, RN, CNS, [place], Serita Grammes, [employer], [employer] phone:  [phone]

## 2022-10-02 NOTE — Progress Notes (Signed)
Subjective: Jeffrey Morrison is a 27 year old male with a medical history significant for sickle cell disease was admitted for sickle cell pain crisis. Patient has no new complaints on today.  Pain is mostly to groin, upper and lower extremities.  Patient is also complaining of draining lower extremity ulcers.  Patient has had ulcers that are in various stages of healing over the past several months.  He is currently incarcerated and states that ulcers have not been treated or wrapped since previous hospitalization.  Pain intensity is 9/10.  Patient denies headache, chest pain, urinary symptoms, nausea, vomiting, or diarrhea.  Objective:  Vital signs in last 24 hours:  Vitals:   10/02/22 0453 10/02/22 0852 10/02/22 1015 10/02/22 1018  BP: 125/71   123/65  Pulse: 66   78  Resp: '14 14 17 18  '$ Temp: 98.3 F (36.8 C)   98.5 F (36.9 C)  TempSrc: Oral   Oral  SpO2: 98% 99% 98% 98%  Weight:      Height:        Intake/Output from previous day:   Intake/Output Summary (Last 24 hours) at 10/02/2022 1112 Last data filed at 10/02/2022 1031 Gross per 24 hour  Intake 670.34 ml  Output --  Net 670.34 ml    Physical Exam: General: Alert, awake, oriented x3, in no acute distress.  HEENT: [place]/AT [place]ER[place], EOMI Neck: Trachea midline,  no masses, no thyromegal,y no JVD, no carotid bruit OROPHARYNX:  Moist, No exudate/ erythema/lesions.  Heart: Regular rate and rhythm, without murmurs, rubs, gallops, PMI non-displaced, no heaves or thrills on palpation.  Lungs: Clear to auscultation, no wheezing or rhonchi noted. No increased vocal fremitus resonant to percussion  Abdomen: Soft, nontender, nondistended, positive bowel sounds, no masses no hepatosplenomegaly noted..  Neuro: No focal neurological deficits noted cranial nerves II through XII grossly intact. DTRs 2+ bilaterally upper and lower extremities. Strength 5 out of 5 in bilateral upper and lower extremities. Musculoskeletal: No warm swelling or  erythema around joints, no spinal tenderness noted. Psychiatric: Patient alert and oriented x3, good insight and cognition, good recent to remote recall. Lymph node survey: Fullness to left inguinal node, painful to touch Skin: Left shin, exterior, shallow,red base, minimal purulent drainage (yellow), and dime sized, uneven borders, minimal edema to bilateral lower extremity.  Painful to touch.  Lab Results:  Basic Metabolic Panel:    Component Value Date/Time   NA 136 10/01/2022 1313   NA 140 10/02/2017 1126   K 4.4 10/01/2022 1313   CL 102 10/01/2022 1313   CO2 26 10/01/2022 1313   BUN 20 10/01/2022 1313   BUN 9 10/02/2017 1126   CREATININE 1.22 10/01/2022 1313   GLUCOSE 97 10/01/2022 1313   CALCIUM 8.9 10/01/2022 1313   CBC:    Component Value Date/Time   WBC 17.1 (H) 10/01/2022 1313   HGB 7.0 (L) 10/01/2022 1313   HGB 7.5 (L) 10/02/2017 1126   HCT 20.5 (L) 10/01/2022 1313   HCT 22.0 (L) 10/02/2017 1126   PLT 380 10/01/2022 1313   PLT 511 (H) 10/02/2017 1126   MCV 92.8 10/01/2022 1313   MCV 94 10/02/2017 1126   NEUTROABS 10.4 (H) 10/01/2022 1313   NEUTROABS 8.8 (H) 10/02/2017 1126   LYMPHSABS 4.2 (H) 10/01/2022 1313   LYMPHSABS 5.2 (H) 10/02/2017 1126   MONOABS 1.8 (H) 10/01/2022 1313   EOSABS 0.6 (H) 10/01/2022 1313   EOSABS 0.2 10/02/2017 1126   BASOSABS 0.0 10/01/2022 1313   BASOSABS 0.1 10/02/2017 1126  No results found for this or any previous visit (from the past 240 hour(s)).  Studies/Results: CT ABDOMEN PELVIS WO CONTRAST  Result Date: 10/01/2022 CLINICAL DATA:  Lymphadenopathy, groin EXAM: CT ABDOMEN AND PELVIS WITHOUT CONTRAST TECHNIQUE: Multidetector CT imaging of the abdomen and pelvis was performed following the standard protocol without IV contrast. RADIATION DOSE REDUCTION: This exam was performed according to the departmental dose-optimization program which includes automated exposure control, adjustment of the mA and/or kV according to patient  size and/or use of iterative reconstruction technique. COMPARISON:  CT abdomen pelvis 11/11/2018 FINDINGS: Lower chest: Bibasilar atelectasis. Hepatobiliary: No focal liver abnormality. No gallstones, gallbladder wall thickening, or pericholecystic fluid. No biliary dilatation. Pancreas: No focal lesion. Normal pancreatic contour. No surrounding inflammatory changes. No main pancreatic ductal dilatation. Spleen: Splenectomy. Adrenals/Urinary Tract: No adrenal nodule bilaterally. No nephrolithiasis and no hydronephrosis. No definite contour-deforming renal mass. No ureterolithiasis or hydroureter. The urinary bladder is unremarkable. Stomach/Bowel: Stomach is within normal limits. No evidence of bowel wall thickening or dilatation. Stool throughout the colon. Appendix appears normal. Vascular/Lymphatic: No abdominal aorta or iliac aneurysm. Prominent but nonenlarged mesenteric lymph nodes. No abdominal or pelvic lymphadenopathy. Enlarged left 2 cm inguinal lymph node (2:96). Prominent right [place] lymph node measuring 1.2 cm. No definite enlarged right inguinal lymph node. Reproductive: Prostate is unremarkable. Other: No intraperitoneal free fluid. No intraperitoneal free gas. No organized fluid collection. Musculoskeletal: No abdominal wall hernia or abnormality. Chronic diffuse sclerotic appearance of the axial and appendicular skeleton. No acute displaced fracture. 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 IMPRESSION: 1. Enlarged left inguinal lymph node (2 cm). 2. Prominent but nonenlarged mesenteric lymph nodes. 3. Stool throughout the colon-correlate for constipation. 4. Chronic diffuse sclerotic appearance of the axial and appendicular skeleton. 5. Status post splenectomy. Electronically Signed   By: Iven Finn M.D.   On: 10/01/2022 17:15    Medications: Scheduled Meds:  bacitracin   Topical Daily   busPIRone  20 mg Oral QHS   doxycycline  100 mg Oral Q12H   enoxaparin (LOVENOX) injection   40 mg Subcutaneous Q24H   HYDROmorphone   Intravenous Q4H   hydroxyurea  500 mg Oral Daily   ketorolac  15 mg Intravenous Q6H   oxyCODONE  5 mg Oral Q4H while awake   senna-docusate  1 tablet Oral BID   Continuous Infusions:  sodium chloride 10 mL/hr at 10/02/22 0305   PRN Meds:.acetaminophen, hydrOXYzine, naloxone **AND** sodium chloride flush, ondansetron, polyethylene glycol  Consultants: none  Procedures: none  Antibiotics: Doxycycline  Assessment/Plan: Principal Problem:   Sickle cell pain crisis ([place]) Active Problems:   Leukocytosis   Non-healing wound of left lower extremity   GAD (generalized anxiety disorder)  Sickle cell disease with pain crisis: Continue full dose IV Dilaudid PCA Oxycodone 5 mg every 4 hours as needed for severe breakthrough pain Toradol 15 mg IV every 6 hours Monitor vital signs very closely, reevaluate pain scale regularly, and supplemental oxygen as needed. Continue folic acid and hydroxyurea  Anemia of chronic disease: Hemoglobin remains slightly below patient's baseline at 7.3 g/dL.  No transfusion at this time.  Possible hemodilution, decrease IV fluids to KVO.  Follow labs in AM.    Leukocytosis: WBCs improving.  Labs in AM.  Left lower extremity ulcer:  Left shin, exterior, shallow,red base, minimal purulent drainage (yellow), and dime sized, uneven borders, minimal edema to bilateral lower extremity.  Painful to touch. Continue Antibiotics Appreciate wound cares input  Left groin pain: Reports worsening  left groin pain over the past several days.  Patient afebrile.  No urinary frequency, urgency, or retention.  No penile discharge.  LLQ discomfort.  Review CT of abdomen/pelvis as results become available.  Constipation:  CT of abdomen and pelvis shows stool throughout the colon that is consistent with constipation. Continue bowel regimen along with lactulose x 2 doses     Code Status: Full Code Family Communication:  N/A Disposition Plan: Not yet ready for discharge  [place], MSN, FNP-C Patient [facility] [address] [place], [place] [address] [phone]  If 7PM-7AM, please contact night-coverage.  10/02/2022, 11:12 AM  LOS: 2 days

## 2022-10-03 DIAGNOSIS — D5700 Hb-SS disease with crisis, unspecified: Secondary | ICD-10-CM | POA: Diagnosis not present

## 2022-10-03 LAB — BASIC METABOLIC PANEL
Anion gap: 6 (ref 5–15)
BUN: 20 mg/dL (ref 6–20)
CO2: 28 mmol/L (ref 22–32)
Calcium: 9 mg/dL (ref 8.9–10.3)
Chloride: 106 mmol/L (ref 98–111)
Creatinine, Ser: 1.18 mg/dL (ref 0.61–1.24)
GFR, Estimated: >60 mL/min (ref >60)
Glucose, Bld: 95 mg/dL (ref 70–99)
Potassium: 4.3 mmol/L (ref 3.5–5.1)
Sodium: 140 mmol/L (ref 135–145)

## 2022-10-03 LAB — CBC
HCT: 20.6 % — ABNORMAL LOW (ref 39.0–52.0)
Hemoglobin: 7.1 g/dL — ABNORMAL LOW (ref 13.0–17.0)
MCH: 32.3 pg (ref 26.0–34.0)
MCHC: 34.5 g/dL (ref 30.0–36.0)
MCV: 93.6 fL (ref 80.0–100.0)
Platelets: 344 10*3/uL (ref 150–400)
RBC: 2.2 MIL/uL — ABNORMAL LOW (ref 4.22–5.81)
RDW: 22.5 % — ABNORMAL HIGH (ref 11.5–15.5)
WBC: 13 10*3/uL — ABNORMAL HIGH (ref 4.0–10.5)
nRBC: 7.9 % — ABNORMAL HIGH (ref 0.0–0.2)

## 2022-10-03 NOTE — Progress Notes (Addendum)
Subjective: Jeffrey Morrison is a 27 year old male with a medical history significant for sickle cell disease was admitted for sickle cell pain crisis. Patient has no new complaints on today.  Pain is mostly to groin, upper and lower extremities.  Patient is also complaining of draining lower extremity ulcers.   Pain intensity is 7/10.  Patient denies headache, chest pain, urinary symptoms, nausea, vomiting, or diarrhea.  Objective:  Vital signs in last 24 hours:  Vitals:   10/03/22 0559 10/03/22 0809 10/03/22 1034 10/03/22 1112  BP: 130/79  102/67   Pulse: 78  77   Resp: '18 15 20 14  '$ Temp: 98 F (36.7 C)  98.1 F (36.7 C)   TempSrc: Oral  Oral   SpO2: 99% 100% 92% 100%  Weight:      Height:        Intake/Output from previous day:   Intake/Output Summary (Last 24 hours) at 10/03/2022 1137 Last data filed at 10/03/2022 0900 Gross per 24 hour  Intake 310.28 ml  Output 1625 ml  Net -1314.72 ml    Physical Exam: General: Alert, awake, oriented x3, in no acute distress.  HEENT: [place]/AT [place], EOMI Neck: Trachea midline,  no masses, no thyromegal,y no JVD, no carotid bruit OROPHARYNX:  Moist, No exudate/ erythema/lesions.  Heart: Regular rate and rhythm, without murmurs, rubs, gallops, PMI non-displaced, no heaves or thrills on palpation.  Lungs: Clear to auscultation, no wheezing or rhonchi noted. No increased vocal fremitus resonant to percussion  Abdomen: Distended, hard, positive bowel sounds, no masses no hepatosplenomegaly noted..  Neuro: No focal neurological deficits noted cranial nerves II through XII grossly intact. DTRs 2+ bilaterally upper and lower extremities. Strength 5 out of 5 in bilateral upper and lower extremities. Musculoskeletal: No warm swelling or erythema around joints, no spinal tenderness noted. Psychiatric: Patient alert and oriented x3, good insight and cognition, good recent to remote recall. Lymph node survey: Fullness to left inguinal node, painful to  touch Skin: Left shin, exterior, shallow,red base, minimal purulent drainage (yellow), and dime sized, uneven borders, minimal edema to bilateral lower extremity.  Painful to touch.  Lab Results:  Basic Metabolic Panel:    Component Value Date/Time   NA 140 10/03/2022 0650   NA 140 10/02/2017 1126   K 4.3 10/03/2022 0650   CL 106 10/03/2022 0650   CO2 28 10/03/2022 0650   BUN 20 10/03/2022 0650   BUN 9 10/02/2017 1126   CREATININE 1.18 10/03/2022 0650   GLUCOSE 95 10/03/2022 0650   CALCIUM 9.0 10/03/2022 0650   CBC:    Component Value Date/Time   WBC 13.0 (H) 10/03/2022 0650   HGB 7.1 (L) 10/03/2022 0650   HGB 7.5 (L) 10/02/2017 1126   HCT 20.6 (L) 10/03/2022 0650   HCT 22.0 (L) 10/02/2017 1126   PLT 344 10/03/2022 0650   PLT 511 (H) 10/02/2017 1126   MCV 93.6 10/03/2022 0650   MCV 94 10/02/2017 1126   NEUTROABS 10.4 (H) 10/01/2022 1313   NEUTROABS 8.8 (H) 10/02/2017 1126   LYMPHSABS 4.2 (H) 10/01/2022 1313   LYMPHSABS 5.2 (H) 10/02/2017 1126   MONOABS 1.8 (H) 10/01/2022 1313   EOSABS 0.6 (H) 10/01/2022 1313   EOSABS 0.2 10/02/2017 1126   BASOSABS 0.0 10/01/2022 1313   BASOSABS 0.1 10/02/2017 1126    No results found for this or any previous visit (from the past 240 hour(s)).  Studies/Results: CT ABDOMEN PELVIS WO CONTRAST  Result Date: 10/01/2022 CLINICAL DATA:  Lymphadenopathy, groin EXAM: CT  ABDOMEN AND PELVIS WITHOUT CONTRAST TECHNIQUE: Multidetector CT imaging of the abdomen and pelvis was performed following the standard protocol without IV contrast. RADIATION DOSE REDUCTION: This exam was performed according to the departmental dose-optimization program which includes automated exposure control, adjustment of the mA and/or kV according to patient size and/or use of iterative reconstruction technique. COMPARISON:  CT abdomen pelvis 11/11/2018 FINDINGS: Lower chest: Bibasilar atelectasis. Hepatobiliary: No focal liver abnormality. No gallstones, gallbladder wall  thickening, or pericholecystic fluid. No biliary dilatation. Pancreas: No focal lesion. Normal pancreatic contour. No surrounding inflammatory changes. No main pancreatic ductal dilatation. Spleen: Splenectomy. Adrenals/Urinary Tract: No adrenal nodule bilaterally. No nephrolithiasis and no hydronephrosis. No definite contour-deforming renal mass. No ureterolithiasis or hydroureter. The urinary bladder is unremarkable. Stomach/Bowel: Stomach is within normal limits. No evidence of bowel wall thickening or dilatation. Stool throughout the colon. Appendix appears normal. Vascular/Lymphatic: No abdominal aorta or iliac aneurysm. Prominent but nonenlarged mesenteric lymph nodes. No abdominal or pelvic lymphadenopathy. Enlarged left 2 cm inguinal lymph node (2:96). Prominent right [place] lymph node measuring 1.2 cm. No definite enlarged right inguinal lymph node. Reproductive: Prostate is unremarkable. Other: No intraperitoneal free fluid. No intraperitoneal free gas. No organized fluid collection. Musculoskeletal: No abdominal wall hernia or abnormality. Chronic diffuse sclerotic appearance of the axial and appendicular skeleton. No acute displaced fracture. 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 IMPRESSION: 1. Enlarged left inguinal lymph node (2 cm). 2. Prominent but nonenlarged mesenteric lymph nodes. 3. Stool throughout the colon-correlate for constipation. 4. Chronic diffuse sclerotic appearance of the axial and appendicular skeleton. 5. Status post splenectomy. Electronically Signed   By: Iven Finn M.D.   On: 10/01/2022 17:15    Medications: Scheduled Meds:  bacitracin   Topical Daily   busPIRone  20 mg Oral QHS   doxycycline  100 mg Oral Q12H   enoxaparin (LOVENOX) injection  40 mg Subcutaneous Q24H   HYDROmorphone   Intravenous Q4H   hydroxyurea  500 mg Oral Daily   ketorolac  15 mg Intravenous Q6H   oxyCODONE  5 mg Oral Q4H while awake   senna-docusate  1 tablet Oral BID    Continuous Infusions:  sodium chloride Stopped (10/02/22 1007)   PRN Meds:.acetaminophen, hydrOXYzine, naloxone **AND** sodium chloride flush, ondansetron, polyethylene glycol  Consultants: none  Procedures: none  Antibiotics: Doxycycline  Assessment/Plan: Principal Problem:   Sickle cell pain crisis ([place]) Active Problems:   Leukocytosis   Non-healing wound of left lower extremity   GAD (generalized anxiety disorder)  Sickle cell disease with pain crisis: Continue full dose IV Dilaudid PCA Oxycodone 5 mg every 4 hours as needed for severe breakthrough pain Toradol 15 mg IV every 6 hours Monitor vital signs very closely, reevaluate pain scale regularly, and supplemental oxygen as needed. Continue folic acid and hydroxyurea  Anemia of chronic disease: Hemoglobin remains slightly below patient's baseline at 7.1g/dL.  No transfusion at this time.   Follow labs in AM.    Leukocytosis: WBCs improving.  Labs in AM.  Left lower extremity ulcer:  Left shin, exterior, shallow,red base, minimal purulent drainage (yellow), and dime sized, uneven borders, minimal edema to bilateral lower extremity.  Painful to touch. Continue Antibiotics Appreciate wound cares input  Left groin pain: Left groin pain improving no urinary frequency, urgency, or retention.  No penile discharge.   Constipation:  CT of abdomen and pelvis shows stool throughout the colon that is consistent with constipation.  Abdomen slightly distended and hard.  Patient has not had  bowel movement, continues to have flatulence. Repeat lactulose lactulose x 2 doses     Code Status: Full Code Family Communication: N/A Disposition Plan: Not yet ready for discharge  [place], MSN, FNP-C Patient [facility] [address] [place], [place] [address] [phone]  If 7PM-7AM, please contact night-coverage.  10/03/2022, 11:37 AM  LOS: 3 days

## 2022-10-04 DIAGNOSIS — D5700 Hb-SS disease with crisis, unspecified: Secondary | ICD-10-CM | POA: Diagnosis not present

## 2022-10-04 LAB — MRSA NEXT GEN BY PCR, NASAL: MRSA by PCR Next Gen: NOT DETECTED

## 2022-10-04 MED ORDER — LIP MEDEX EX OINT
1.0000 | TOPICAL_OINTMENT | CUTANEOUS | Status: DC | PRN
  Administered 2022-10-04: 1 via TOPICAL
  Filled 2022-10-04: qty 7

## 2022-10-04 MED ORDER — SALINE SPRAY 0.65 % NA SOLN
1.0000 | NASAL | Status: DC | PRN
  Administered 2022-10-04 – 2022-10-05 (×2): 1 via NASAL
  Filled 2022-10-04: qty 44

## 2022-10-04 NOTE — Progress Notes (Signed)
Patient ID: KHARTER BREW, male   DOB: October 14, 1995, 27 y.o.   MRN: [id number] Subjective: Kojo Liby is a 27 year old male with a medical history significant for sickle cell disease was admitted for sickle cell pain crisis.  Patient continues to endorse significant amount of pain in his lower extremities, lower back and hip joints.  He said his pain is at 7/10 again today.  He is requesting tramadol.  He denies any fever, headache, cough, chest pain, shortness of breath, nausea, vomiting, diarrhea or urinary symptoms.  Objective:  Vital signs in last 24 hours:  Vitals:   10/04/22 0200 10/04/22 0504 10/04/22 0636 10/04/22 1031  BP: (!) 97/54  107/63 117/72  Pulse: 63  62 62  Resp: '14 12 14 16  '$ Temp: 98.3 F (36.8 C)  98.5 F (36.9 C) 98.1 F (36.7 C)  TempSrc: Oral  Oral Oral  SpO2: 95% 99% 96% 99%  Weight:      Height:        Intake/Output from previous day:   Intake/Output Summary (Last 24 hours) at 10/04/2022 1328 Last data filed at 10/04/2022 6195 Gross per 24 hour  Intake --  Output 1200 ml  Net -1200 ml    Physical Exam: General: Alert, awake, oriented x3, in no acute distress but anxious looking.  HEENT: [place]/AT [place], EOMI Neck: Trachea midline,  no masses, no thyromegal,y no JVD, no carotid bruit OROPHARYNX:  Moist, No exudate/ erythema/lesions.  Heart: Regular rate and rhythm, without murmurs, rubs, gallops, PMI non-displaced, no heaves or thrills on palpation.  Lungs: Clear to auscultation, no wheezing or rhonchi noted. No increased vocal fremitus resonant to percussion  Abdomen: Full, soft, nontender, nondistended, positive bowel sounds, no masses no hepatosplenomegaly noted..  Neuro: No focal neurological deficits noted cranial nerves II through XII grossly intact. DTRs 2+ bilaterally upper and lower extremities. Strength 5 out of 5 in bilateral upper and lower extremities. Musculoskeletal: Left lower leg dressing in situ.  No warm or significant swelling or  erythema around joints, no spinal tenderness noted. Psychiatric: Patient alert and oriented x3, good insight and cognition, good recent to remote recall. Lymph node survey: No cervical axillary or inguinal lymphadenopathy noted.  Lab Results:  Basic Metabolic Panel:    Component Value Date/Time   NA 140 10/03/2022 0650   NA 140 10/02/2017 1126   K 4.3 10/03/2022 0650   CL 106 10/03/2022 0650   CO2 28 10/03/2022 0650   BUN 20 10/03/2022 0650   BUN 9 10/02/2017 1126   CREATININE 1.18 10/03/2022 0650   GLUCOSE 95 10/03/2022 0650   CALCIUM 9.0 10/03/2022 0650   CBC:    Component Value Date/Time   WBC 13.0 (H) 10/03/2022 0650   HGB 7.1 (L) 10/03/2022 0650   HGB 7.5 (L) 10/02/2017 1126   HCT 20.6 (L) 10/03/2022 0650   HCT 22.0 (L) 10/02/2017 1126   PLT 344 10/03/2022 0650   PLT 511 (H) 10/02/2017 1126   MCV 93.6 10/03/2022 0650   MCV 94 10/02/2017 1126   NEUTROABS 10.4 (H) 10/01/2022 1313   NEUTROABS 8.8 (H) 10/02/2017 1126   LYMPHSABS 4.2 (H) 10/01/2022 1313   LYMPHSABS 5.2 (H) 10/02/2017 1126   MONOABS 1.8 (H) 10/01/2022 1313   EOSABS 0.6 (H) 10/01/2022 1313   EOSABS 0.2 10/02/2017 1126   BASOSABS 0.0 10/01/2022 1313   BASOSABS 0.1 10/02/2017 1126    No results found for this or any previous visit (from the past 240 hour(s)).  Studies/Results: No results found.  Medications: Scheduled Meds:  bacitracin   Topical Daily   busPIRone  20 mg Oral QHS   doxycycline  100 mg Oral Q12H   enoxaparin (LOVENOX) injection  40 mg Subcutaneous Q24H   HYDROmorphone   Intravenous Q4H   hydroxyurea  500 mg Oral Daily   oxyCODONE  5 mg Oral Q4H while awake   senna-docusate  1 tablet Oral BID   Continuous Infusions:  sodium chloride Stopped (10/02/22 1007)   PRN Meds:.acetaminophen, hydrOXYzine, naloxone **AND** sodium chloride flush, ondansetron, polyethylene glycol  Consultants: None  Procedures: None  Antibiotics: Doxycycline  Assessment/Plan: Principal Problem:    Sickle cell pain crisis ([place]) Active Problems:   Leukocytosis   Non-healing wound of left lower extremity   GAD (generalized anxiety disorder)  Hb Sickle Cell Disease with Pain crisis: Continue IVF at [facility], continue weight based Dilaudid PCA at current dose setting, continue oxycodone 5 mg every 4 hours as needed for pain.  Continue IV Toradol 15 mg Q 6 H for total of 5 days, Monitor vitals very closely, Re-evaluate pain scale regularly, 2 L of Oxygen by [place].  Continue folic acid and hydroxyurea Leukocytosis: Stable.  Patient on chronic doxycycline.  Will continue.  Repeat labs in AM. Anemia of Chronic Disease: Hemoglobin appears stable although slightly below baseline today. There is no clinical indication for blood transfusion at this time.  Will monitor closely and transfuse as appropriate. Chronic pain Syndrome: Continue home pain medications as ordered. Left lower extremity ulcer.  Dressing in situ, dry.  Continue current management. Appreciate wound care team input. Constipation: Significant stool burden seen on CT abdomen. Continue laxatives.  Code Status: Full Code Family Communication: N/A Disposition Plan: Not yet ready for discharge  Raye Slyter  If 7PM-7AM, please contact night-coverage.  10/04/2022, 1:28 PM  LOS: 4 days

## 2022-10-05 DIAGNOSIS — D5700 Hb-SS disease with crisis, unspecified: Secondary | ICD-10-CM | POA: Diagnosis not present

## 2022-10-05 LAB — CBC
HCT: 24 % — ABNORMAL LOW (ref 39.0–52.0)
Hemoglobin: 8 g/dL — ABNORMAL LOW (ref 13.0–17.0)
MCH: 31.1 pg (ref 26.0–34.0)
MCHC: 33.3 g/dL (ref 30.0–36.0)
MCV: 93.4 fL (ref 80.0–100.0)
Platelets: 368 10*3/uL (ref 150–400)
RBC: 2.57 MIL/uL — ABNORMAL LOW (ref 4.22–5.81)
RDW: 21.5 % — ABNORMAL HIGH (ref 11.5–15.5)
WBC: 16.6 10*3/uL — ABNORMAL HIGH (ref 4.0–10.5)
nRBC: 4.5 % — ABNORMAL HIGH (ref 0.0–0.2)

## 2022-10-05 MED ORDER — DIPHENHYDRAMINE HCL 25 MG PO CAPS
25.0000 mg | ORAL_CAPSULE | Freq: Once | ORAL | Status: AC
  Administered 2022-10-05: 25 mg via ORAL
  Filled 2022-10-05: qty 1

## 2022-10-05 NOTE — Progress Notes (Signed)
Patient ID: Jeffrey Morrison, male   DOB: 15-Jun-1996, 27 y.o.   MRN: [id number] Subjective: Jeffrey Morrison is a 27 year old male with a medical history significant for sickle cell disease was admitted for sickle cell pain crisis.  Patient still complaining of significant pain in his lower extremities, lower back and his hip joints.  He continues to rate his pain as 7/10.  He has not tried to ambulate.  He denies any fever, headache, cough, chest pain, shortness of breath, nausea, vomiting or diarrhea.  No urinary symptoms.  Objective:  Vital signs in last 24 hours:  Vitals:   10/05/22 0500 10/05/22 0619 10/05/22 0809 10/05/22 0948  BP:  104/67  115/74  Pulse:  75  63  Resp: '16 14 13 13  '$ Temp:  98.6 F (37 C)  98.2 F (36.8 C)  TempSrc:  Oral  Oral  SpO2: 100% 98% 97% 100%  Weight:      Height:        Intake/Output from previous day:   Intake/Output Summary (Last 24 hours) at 10/05/2022 1134 Last data filed at 10/05/2022 1030 Gross per 24 hour  Intake 357 ml  Output 1200 ml  Net -843 ml     Physical Exam: General: Alert, awake, oriented x3, in no acute distress but anxious looking.  HEENT: /AT PEERL, EOMI Neck: Trachea midline,  no masses, no thyromegal,y no JVD, no carotid bruit OROPHARYNX:  Moist, No exudate/ erythema/lesions.  Heart: Regular rate and rhythm, without murmurs, rubs, gallops, PMI non-displaced, no heaves or thrills on palpation.  Lungs: Clear to auscultation, no wheezing or rhonchi noted. No increased vocal fremitus resonant to percussion  Abdomen: Full, soft, nontender, nondistended, positive bowel sounds, no masses no hepatosplenomegaly noted..  Neuro: No focal neurological deficits noted cranial nerves II through XII grossly intact. DTRs 2+ bilaterally upper and lower extremities. Strength 5 out of 5 in bilateral upper and lower extremities. Musculoskeletal: Left lower leg dressing in situ.  No warm or significant swelling or erythema around joints, no  spinal tenderness noted. Psychiatric: Patient alert and oriented x3, good insight and cognition, good recent to remote recall. Lymph node survey: No cervical axillary or inguinal lymphadenopathy noted.  Lab Results:  Basic Metabolic Panel:    Component Value Date/Time   NA 140 10/03/2022 0650   NA 140 10/02/2017 1126   K 4.3 10/03/2022 0650   CL 106 10/03/2022 0650   CO2 28 10/03/2022 0650   BUN 20 10/03/2022 0650   BUN 9 10/02/2017 1126   CREATININE 1.18 10/03/2022 0650   GLUCOSE 95 10/03/2022 0650   CALCIUM 9.0 10/03/2022 0650   CBC:    Component Value Date/Time   WBC 13.0 (H) 10/03/2022 0650   HGB 7.1 (L) 10/03/2022 0650   HGB 7.5 (L) 10/02/2017 1126   HCT 20.6 (L) 10/03/2022 0650   HCT 22.0 (L) 10/02/2017 1126   PLT 344 10/03/2022 0650   PLT 511 (H) 10/02/2017 1126   MCV 93.6 10/03/2022 0650   MCV 94 10/02/2017 1126   NEUTROABS 10.4 (H) 10/01/2022 1313   NEUTROABS 8.8 (H) 10/02/2017 1126   LYMPHSABS 4.2 (H) 10/01/2022 1313   LYMPHSABS 5.2 (H) 10/02/2017 1126   MONOABS 1.8 (H) 10/01/2022 1313   EOSABS 0.6 (H) 10/01/2022 1313   EOSABS 0.2 10/02/2017 1126   BASOSABS 0.0 10/01/2022 1313   BASOSABS 0.1 10/02/2017 1126    Recent Results (from the past 240 hour(s))  MRSA Next Gen by PCR, Nasal     Status:  None   Collection Time: 10/04/22  4:12 PM   Specimen: Nasal Mucosa; Nasal Swab  Result Value Ref Range Status   MRSA by PCR Next Gen NOT DETECTED NOT DETECTED Final    Comment: (NOTE) The GeneXpert MRSA Assay (FDA approved for NASAL specimens only), is one component of a comprehensive MRSA colonization surveillance program. It is not intended to diagnose MRSA infection nor to guide or monitor treatment for MRSA infections. Test performance is not FDA approved in patients less than 68 years old. Performed at [facility], [place] [address], [place], [place] [address]     Studies/Results: No results found.  Medications: Scheduled Meds:   bacitracin   Topical Daily   busPIRone  20 mg Oral QHS   doxycycline  100 mg Oral Q12H   enoxaparin (LOVENOX) injection  40 mg Subcutaneous Q24H   HYDROmorphone   Intravenous Q4H   hydroxyurea  500 mg Oral Daily   oxyCODONE  5 mg Oral Q4H while awake   senna-docusate  1 tablet Oral BID   Continuous Infusions:  sodium chloride Stopped (10/02/22 1007)   PRN Meds:.acetaminophen, hydrOXYzine, lip balm, naloxone **AND** sodium chloride flush, ondansetron, polyethylene glycol, sodium chloride  Consultants: None  Procedures: None  Antibiotics: Doxycycline  Assessment/Plan: Principal Problem:   Sickle cell pain crisis ([place]) Active Problems:   Leukocytosis   Sickle cell anemia with pain (HCC)   Non-healing wound of left lower extremity   GAD (generalized anxiety disorder)  Hb Sickle Cell Disease with Pain crisis: IV fluid at [facility], continue weight based Dilaudid PCA at current dose setting, continue oxycodone 5 mg every 4 hours as needed for pain.  Continue IV Toradol 15 mg Q 6 H for total of 5 days, Monitor vitals very closely, Re-evaluate pain scale regularly, 2 L of Oxygen by [place].  Continue folic acid and hydroxyurea.  Patient encouraged to ambulate.  For possible discharge tomorrow morning. Leukocytosis: Stable. Patient on chronic doxycycline. Will continue. Repeat labs in AM. Anemia of Chronic Disease: Hemoglobin appears stable although slightly below baseline.  Awaiting today's lab results. There is no clinical indication for blood transfusion at this time. Will monitor closely and transfuse as appropriate. Chronic pain Syndrome: Continue home pain medications as ordered. Left lower extremity ulcer: Dressing in situ, dry. Continue current management. Appreciate wound care team's input. Constipation: Continue laxatives.  Code Status: Full Code Family Communication: N/A Disposition Plan: Not yet ready for discharge  [name]  If 7PM-7AM, please contact  night-coverage.  10/05/2022, 11:34 AM  LOS: 5 days

## 2022-10-06 DIAGNOSIS — D5700 Hb-SS disease with crisis, unspecified: Secondary | ICD-10-CM | POA: Diagnosis not present

## 2022-10-06 MED ORDER — OXYCODONE HCL 5 MG PO TABS: 5.0000 mg | ORAL_TABLET | ORAL | 0 refills | Status: AC

## 2022-10-06 MED ORDER — DOXYCYCLINE HYCLATE 100 MG PO TABS: 100.0000 mg | ORAL_TABLET | Freq: Two times a day (BID) | ORAL | 0 refills | Status: AC

## 2022-10-06 NOTE — Hospital Course (Signed)
Jeffrey Morrison is a 27 y.o. 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    In this patient with a documented history of sickle cell disease, the patient reports 1 to 2 days of sharp left lower extremity discomfort, in distribution, quality, and intensity that is consistent with pain experienced at times of their prior acute sickle cell pain crisis.    Denies any associated chest pain, shortness of breath, palpitations, dizziness, nausea, vomiting, diarrhea, dizziness, presyncope, or syncope.  also denies any recent subjective fever, chills, rigors, or generalized myalgias.  No recent headache, neck stiffness, rash, cough, abdominal pain, dysuria, or gross hematuria.   Denies any routine or recent alcohol consumption, and denies any history of recreational drug use.   Per chart review, baseline hemoglobin range appears to be 7-9.   Patient was admitted.  Initiated on Dilaudid PCA.  Also Toradol.  Oral oxycodone gradually titrated down to that.  Ultimately patient did much better and is now being discharged back to jail ibuprofen and occasional oxycodone if a lot of jail.  Patient ultimately continue wound care as well as oxycodone.

## 2022-10-06 NOTE — Discharge Summary (Signed)
Physician Discharge Summary   Patient: Jeffrey Morrison MRN: [id number] DOB: [birth year]  Admit date:     09/29/2022  Discharge date: 10/06/22  Discharge Physician: Barbette Merino   PCP: Dorena Dew, FNP   Recommendations at discharge:   Resume medications at the jail.  Patient to continue ibuprofen and occasional oxycodone if allowed  Discharge Diagnoses: Principal Problem:   Sickle cell pain crisis ([place]) Active Problems:   Leukocytosis   Sickle cell anemia with pain (HCC)   Non-healing wound of left lower extremity   GAD (generalized anxiety disorder)  Resolved Problems:   * No resolved hospital problems. *  Hospital Course: Jeffrey Morrison is a 27 y.o. 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    In this patient with a documented history of sickle cell disease, the patient reports 1 to 2 days of sharp left lower extremity discomfort, in distribution, quality, and intensity that is consistent with pain experienced at times of their prior acute sickle cell pain crisis.    Denies any associated chest pain, shortness of breath, palpitations, dizziness, nausea, vomiting, diarrhea, dizziness, presyncope, or syncope.  also denies any recent subjective fever, chills, rigors, or generalized myalgias.  No recent headache, neck stiffness, rash, cough, abdominal pain, dysuria, or gross hematuria.   Denies any routine or recent alcohol consumption, and denies any history of recreational drug use.   Per chart review, baseline hemoglobin range appears to be 7-9.   Patient was admitted.  Initiated on Dilaudid PCA.  Also Toradol.  Oral oxycodone gradually titrated down to that.  Ultimately patient did much better and is  now being discharged back to jail ibuprofen and occasional oxycodone if a lot of jail.  Patient ultimately continue wound care as well as oxycodone.        Pain control - [employer] Controlled Substance Reporting System database was reviewed. and patient was instructed, not to drive, operate heavy machinery, perform activities at heights, swimming or participation in water activities or provide baby-sitting services while on Pain, Sleep and Anxiety Medications; until their outpatient Physician has advised to do so again. Also recommended to not to take more than prescribed Pain, Sleep and Anxiety Medications.  Consultants: None Procedures performed: Chest x-ray Disposition: Return back to jail Diet recommendation:  Discharge Diet Orders (From admission, onward)     Start     Ordered   10/06/22 0000  Diet - low sodium heart healthy        10/06/22 1142           Regular diet DISCHARGE MEDICATION: Allergies as of 10/06/2022   No Known Allergies      Medication List     STOP taking these medications    amoxicillin-clavulanate 875-125 MG tablet Commonly known as: AUGMENTIN   clindamycin 150 MG capsule Commonly known as: CLEOCIN   penicillin v potassium 500 MG tablet Commonly known as: VEETID       TAKE these medications    ARIPiprazole 5 MG tablet Commonly known as: ABILIFY Take 5 mg by mouth at bedtime.   busPIRone 10 MG tablet Commonly known as: BUSPAR Take 20 mg by mouth at bedtime.   cholecalciferol 25 MCG (1000 UNIT) tablet Commonly known as: VITAMIN D3 Take 3,000 Units by  mouth daily.   docusate sodium 100 MG capsule Commonly known as: COLACE Take 100 mg by mouth 2 (two) times daily.   doxycycline 100 MG tablet Commonly known as: VIBRA-TABS Take 1 tablet (100 mg total) by mouth every 12 (twelve) hours.   eucerin cream Apply 1 Application topically in the morning and at bedtime.   folic acid 1 MG tablet Commonly known as: FOLVITE Take 1 mg  by mouth in the morning.   hydroxyurea 500 MG capsule Commonly known as: HYDREA Take 1 capsule (500 mg total) by mouth daily. May take with food to minimize GI side effects.   ibuprofen 800 MG tablet Commonly known as: ADVIL Take 800 mg by mouth every 8 (eight) hours as needed (pain).   loperamide 2 MG tablet Commonly known as: IMODIUM A-D Take 2 mg by mouth every 6 (six) hours. Every 6 hours for 10 days.   mirtazapine 15 MG tablet Commonly known as: REMERON Take 15 mg by mouth at bedtime.   ondansetron 4 MG disintegrating tablet Commonly known as: ZOFRAN-ODT Take 4 mg by mouth every 6 (six) hours. Every 6 hours for 14 days   oxyCODONE 5 MG immediate release tablet Commonly known as: Oxy IR/ROXICODONE Take 1 tablet (5 mg total) by mouth every 4 (four) hours while awake.   pantoprazole 20 MG tablet Commonly known as: PROTONIX Take 40 mg by mouth 2 (two) times daily.   polyethylene glycol powder 17 GM/SCOOP powder Commonly known as: GLYCOLAX/MIRALAX Take 17 g by mouth daily as needed (constipation).   pseudoephedrine 30 MG tablet Commonly known as: SUDAFED Take 60 mg by mouth at bedtime.   sodium hypochlorite 0.125 % Soln Commonly known as: DAKIN'S 1/4 STRENGTH Irrigate with 1 Application as directed every Monday, Wednesday, and Friday. MWF, soak gauze and apply to left lower leg wound bed and let dwell for 5 minutes. Remove, then apply Aquacel AG hydrofiber, cover with 2x2 gauze, cover with island dressing.               Discharge Care Instructions  (From admission, onward)           Start     Ordered   10/06/22 0000  Discharge wound care:       Comments: See orders above   10/06/22 1142            Discharge Exam: Filed Weights   09/29/22 1311  Weight: 90.7 kg   Constitutional: NAD, calm, comfortable Eyes: PERRL, lids and conjunctivae normal ENMT: Mucous membranes are moist. Posterior pharynx clear of any exudate or lesions.Normal dentition.   Neck: normal, supple, no masses, no thyromegaly Respiratory: clear to auscultation bilaterally, no wheezing, no crackles. Normal respiratory effort. No accessory muscle use.  Cardiovascular: Regular rate and rhythm, no murmurs / rubs / gallops. No extremity edema. 2+ pedal pulses. No carotid bruits.  Abdomen: no tenderness, no masses palpated. No hepatosplenomegaly. Bowel sounds positive.  Musculoskeletal: Good range of motion, no joint swelling or tenderness, Skin: no rashes, lesions, ulcers. No induration Neurologic: CN 2-12 grossly intact. Sensation intact, DTR normal. Strength 5/5 in all 4.  Psychiatric: Normal judgment and insight. Alert and oriented x 3. Normal mood   Condition at discharge: fair  The results of significant diagnostics from this hospitalization (including imaging, microbiology, ancillary and laboratory) are listed below for reference.   Imaging Studies: CT ABDOMEN PELVIS WO CONTRAST  Result Date: 10/01/2022 CLINICAL DATA:  Lymphadenopathy, groin EXAM: CT ABDOMEN AND PELVIS WITHOUT CONTRAST TECHNIQUE: Multidetector  CT imaging of the abdomen and pelvis was performed following the standard protocol without IV contrast. RADIATION DOSE REDUCTION: This exam was performed according to the departmental dose-optimization program which includes automated exposure control, adjustment of the mA and/or kV according to patient size and/or use of iterative reconstruction technique. COMPARISON:  CT abdomen pelvis 11/11/2018 FINDINGS: Lower chest: Bibasilar atelectasis. Hepatobiliary: No focal liver abnormality. No gallstones, gallbladder wall thickening, or pericholecystic fluid. No biliary dilatation. Pancreas: No focal lesion. Normal pancreatic contour. No surrounding inflammatory changes. No main pancreatic ductal dilatation. Spleen: Splenectomy. Adrenals/Urinary Tract: No adrenal nodule bilaterally. No nephrolithiasis and no hydronephrosis. No definite contour-deforming renal mass. No  ureterolithiasis or hydroureter. The urinary bladder is unremarkable. Stomach/Bowel: Stomach is within normal limits. No evidence of bowel wall thickening or dilatation. Stool throughout the colon. Appendix appears normal. Vascular/Lymphatic: No abdominal aorta or iliac aneurysm. Prominent but nonenlarged mesenteric lymph nodes. No abdominal or pelvic lymphadenopathy. Enlarged left 2 cm inguinal lymph node (2:96). Prominent right [place] lymph node measuring 1.2 cm. No definite enlarged right inguinal lymph node. Reproductive: Prostate is unremarkable. Other: No intraperitoneal free fluid. No intraperitoneal free gas. No organized fluid collection. Musculoskeletal: No abdominal wall hernia or abnormality. Chronic diffuse sclerotic appearance of the axial and appendicular skeleton. No acute displaced fracture. 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 IMPRESSION: 1. Enlarged left inguinal lymph node (2 cm). 2. Prominent but nonenlarged mesenteric lymph nodes. 3. Stool throughout the colon-correlate for constipation. 4. Chronic diffuse sclerotic appearance of the axial and appendicular skeleton. 5. Status post splenectomy. Electronically Signed   By: Iven Finn M.D.   On: 10/01/2022 17:15   DG Chest Port 1 View  Result Date: 09/30/2022 CLINICAL DATA:  Sickle cell crisis, pain. EXAM: PORTABLE CHEST 1 VIEW COMPARISON:  Chest x-ray dated 02/19/2021. FINDINGS: Borderline cardiomegaly. Lungs are clear. No pleural effusion or pneumothorax is seen. No acute-appearing osseous abnormality. IMPRESSION: 1. No active disease. No evidence of pneumonia or pulmonary edema. 2. Borderline cardiomegaly. Electronically Signed   By: Franki Cabot M.D.   On: 09/30/2022 08:42   DG Hip Unilat W or Wo Pelvis 2-3 Views Left  Result Date: 09/29/2022 CLINICAL DATA:  Left leg pain sickle cell EXAM: DG HIP (WITH OR WITHOUT PELVIS) 2-3V LEFT COMPARISON:  CT 11/11/2018, 02/21/2017 FINDINGS: There is no evidence of hip fracture  or dislocation. There is no evidence of arthropathy or other focal bone abnormality. IMPRESSION: Negative. Electronically Signed   By: Donavan Foil M.D.   On: 09/29/2022 19:04    Microbiology: Results for orders placed or performed during the hospital encounter of 09/29/22  MRSA Next Gen by PCR, Nasal     Status: None   Collection Time: 10/04/22  4:12 PM   Specimen: Nasal Mucosa; Nasal Swab  Result Value Ref Range Status   MRSA by PCR Next Gen NOT DETECTED NOT DETECTED Final    Comment: (NOTE) The GeneXpert MRSA Assay (FDA approved for NASAL specimens only), is one component of a comprehensive MRSA colonization surveillance program. It is not intended to diagnose MRSA infection nor to guide or monitor treatment for MRSA infections. Test performance is not FDA approved in patients less than 5 years old. Performed at [facility], [place] [address], [place], [place] [address]     Labs: CBC: Recent Labs  Lab 09/29/22 1445 09/30/22 0401 09/30/22 0743 10/01/22 1313 10/03/22 0650 10/05/22 1204  WBC 16.6* 14.2*  --  17.1* 13.0* 16.6*  NEUTROABS 12.6* 7.1  --  10.4*  --   --   HGB 8.0* 6.8* 7.3* 7.0* 7.1* 8.0*  HCT 24.9* 19.9* 21.8* 20.5* 20.6* 24.0*  MCV 99.6 95.7  --  92.8 93.6 93.4  PLT 474* 402*  --  380 344 202   Basic Metabolic Panel: Recent Labs  Lab 09/29/22 1445 09/30/22 0401 10/01/22 1313 10/03/22 0650  NA 138 135 136 140  K 4.0 3.9 4.4 4.3  CL 105 102 102 106  CO2 '26 27 26 28  '$ GLUCOSE 92 93 97 95  BUN '9 12 20 20  '$ CREATININE 0.97 1.24 1.22 1.18  CALCIUM 9.4 8.7* 8.9 9.0  MG 2.0 1.9  --   --    Liver Function Tests: Recent Labs  Lab 09/29/22 1445 09/30/22 0401 10/01/22 1313  AST 29 27 34  ALT '30 27 31  '$ ALKPHOS 77 69 85  BILITOT 2.8* 2.2* 2.6*  PROT 8.0 6.7 7.7  ALBUMIN 4.2 3.6 4.1   CBG: No results for input(s): "GLUCAP" in the last 168 hours.  Discharge time spent: greater than 30 minutes.  SignedBarbette Merino, MD [facility] 10/06/2022

## 2022-10-06 NOTE — Progress Notes (Signed)
[facility] via [facility]. Discharge instructions placed in packet for RN at facility.
